# Patient Record
Sex: Female | Born: 1990 | Race: White | Hispanic: No | Marital: Single | Smoking: Former smoker
Health system: Southern US, Community
[De-identification: ages and names within clinical notes are randomized; demographics above are authoritative.]

## PROBLEM LIST (undated history)

## (undated) ENCOUNTER — Inpatient Hospital Stay (HOSPITAL_COMMUNITY): Payer: Self-pay

## (undated) DIAGNOSIS — R030 Elevated blood-pressure reading, without diagnosis of hypertension: Secondary | ICD-10-CM

## (undated) DIAGNOSIS — O24419 Gestational diabetes mellitus in pregnancy, unspecified control: Secondary | ICD-10-CM

## (undated) DIAGNOSIS — R519 Headache, unspecified: Secondary | ICD-10-CM

## (undated) DIAGNOSIS — O139 Gestational [pregnancy-induced] hypertension without significant proteinuria, unspecified trimester: Secondary | ICD-10-CM

## (undated) DIAGNOSIS — F32A Depression, unspecified: Secondary | ICD-10-CM

## (undated) DIAGNOSIS — E7800 Pure hypercholesterolemia, unspecified: Secondary | ICD-10-CM

## (undated) DIAGNOSIS — F329 Major depressive disorder, single episode, unspecified: Secondary | ICD-10-CM

## (undated) DIAGNOSIS — Z87442 Personal history of urinary calculi: Secondary | ICD-10-CM

## (undated) DIAGNOSIS — O009 Unspecified ectopic pregnancy without intrauterine pregnancy: Secondary | ICD-10-CM

## (undated) DIAGNOSIS — B019 Varicella without complication: Secondary | ICD-10-CM

## (undated) DIAGNOSIS — F988 Other specified behavioral and emotional disorders with onset usually occurring in childhood and adolescence: Secondary | ICD-10-CM

## (undated) DIAGNOSIS — F419 Anxiety disorder, unspecified: Secondary | ICD-10-CM

## (undated) DIAGNOSIS — IMO0001 Reserved for inherently not codable concepts without codable children: Secondary | ICD-10-CM

## (undated) DIAGNOSIS — R51 Headache: Secondary | ICD-10-CM

## (undated) HISTORY — DX: Headache, unspecified: R51.9

## (undated) HISTORY — DX: Varicella without complication: B01.9

## (undated) HISTORY — DX: Gestational diabetes mellitus in pregnancy, unspecified control: O24.419

## (undated) HISTORY — DX: Other specified behavioral and emotional disorders with onset usually occurring in childhood and adolescence: F98.8

## (undated) HISTORY — DX: Elevated blood-pressure reading, without diagnosis of hypertension: R03.0

## (undated) HISTORY — DX: Unspecified ectopic pregnancy without intrauterine pregnancy: O00.90

## (undated) HISTORY — DX: Pure hypercholesterolemia, unspecified: E78.00

## (undated) HISTORY — DX: Personal history of urinary calculi: Z87.442

## (undated) HISTORY — DX: Reserved for inherently not codable concepts without codable children: IMO0001

## (undated) HISTORY — DX: Anxiety disorder, unspecified: F41.9

## (undated) HISTORY — DX: Depression, unspecified: F32.A

## (undated) HISTORY — DX: Headache: R51

## (undated) HISTORY — DX: Major depressive disorder, single episode, unspecified: F32.9

## (undated) HISTORY — PX: WISDOM TOOTH EXTRACTION: SHX21

---
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
## 2010-05-02 ENCOUNTER — Emergency Department (HOSPITAL_BASED_OUTPATIENT_CLINIC_OR_DEPARTMENT_OTHER)
Admission: EM | Admit: 2010-05-02 | Discharge: 2010-05-02 | Payer: Self-pay | Source: Home / Self Care | Admitting: Emergency Medicine

## 2010-06-19 ENCOUNTER — Other Ambulatory Visit: Payer: Self-pay | Admitting: Obstetrics and Gynecology

## 2010-07-23 LAB — DIFFERENTIAL
Basophils Absolute: 0.1 10*3/uL (ref 0.0–0.1)
Basophils Relative: 1 % (ref 0–1)
Eosinophils Absolute: 0.1 10*3/uL (ref 0.0–0.7)
Eosinophils Relative: 1 % (ref 0–5)
Lymphocytes Relative: 34 % (ref 12–46)
Lymphs Abs: 3.1 10*3/uL (ref 0.7–4.0)
Monocytes Absolute: 0.6 10*3/uL (ref 0.1–1.0)
Monocytes Relative: 7 % (ref 3–12)
Neutro Abs: 5.2 10*3/uL (ref 1.7–7.7)
Neutrophils Relative %: 57 % (ref 43–77)

## 2010-07-23 LAB — URINALYSIS, ROUTINE W REFLEX MICROSCOPIC
Bilirubin Urine: NEGATIVE
Glucose, UA: NEGATIVE mg/dL
Hgb urine dipstick: NEGATIVE
Ketones, ur: NEGATIVE mg/dL
Nitrite: NEGATIVE
Protein, ur: NEGATIVE mg/dL
Specific Gravity, Urine: 1.023 (ref 1.005–1.030)
Urobilinogen, UA: 0.2 mg/dL (ref 0.0–1.0)
pH: 5.5 (ref 5.0–8.0)

## 2010-07-23 LAB — COMPREHENSIVE METABOLIC PANEL
ALT: 37 U/L — ABNORMAL HIGH (ref 0–35)
AST: 27 U/L (ref 0–37)
Albumin: 4.5 g/dL (ref 3.5–5.2)
Alkaline Phosphatase: 81 U/L (ref 39–117)
BUN: 9 mg/dL (ref 6–23)
CO2: 23 mEq/L (ref 19–32)
Calcium: 9.4 mg/dL (ref 8.4–10.5)
Chloride: 107 mEq/L (ref 96–112)
Creatinine, Ser: 0.8 mg/dL (ref 0.4–1.2)
GFR calc Af Amer: >60 mL/min (ref >60)
GFR calc non Af Amer: >60 mL/min (ref >60)
Glucose, Bld: 99 mg/dL (ref 70–99)
Potassium: 3.8 mEq/L (ref 3.5–5.1)
Sodium: 145 mEq/L (ref 135–145)
Total Bilirubin: 1 mg/dL (ref 0.3–1.2)
Total Protein: 7.6 g/dL (ref 6.0–8.3)

## 2010-07-23 LAB — CBC
HCT: 43.8 % (ref 36.0–46.0)
Hemoglobin: 15.8 g/dL — ABNORMAL HIGH (ref 12.0–15.0)
MCH: 29.6 pg (ref 26.0–34.0)
MCHC: 36.1 g/dL — ABNORMAL HIGH (ref 30.0–36.0)
MCV: 82 fL (ref 78.0–100.0)
Platelets: 265 10*3/uL (ref 150–400)
RBC: 5.34 MIL/uL — ABNORMAL HIGH (ref 3.87–5.11)
RDW: 12 % (ref 11.5–15.5)
WBC: 9.1 10*3/uL (ref 4.0–10.5)

## 2010-07-23 LAB — URINE MICROSCOPIC-ADD ON

## 2010-07-23 LAB — WET PREP, GENITAL
Trich, Wet Prep: NONE SEEN
Yeast Wet Prep HPF POC: NONE SEEN

## 2010-07-23 LAB — PREGNANCY, URINE: Preg Test, Ur: NEGATIVE

## 2010-07-23 LAB — GC/CHLAMYDIA PROBE AMP, GENITAL
Chlamydia, DNA Probe: NEGATIVE
GC Probe Amp, Genital: NEGATIVE

## 2010-09-28 NOTE — Op Note (Signed)
NAMEBERNITA, BECKSTROM NO.:  [id number]   MEDICAL RECORD NO.:  [id number]          PATIENT TYPE:  AMB   LOCATION:  SDS                          FACILITY:  [facility]   PHYSICIAN:  Jon Gills, M.D.  DATE OF BIRTH:  [birth year]   DATE OF PROCEDURE:  02/24/2006  DATE OF DISCHARGE:  02/24/2006                                 OPERATIVE REPORT   PREOPERATIVE DIAGNOSIS:  Hematemesis.   POSTOPERATIVE DIAGNOSIS:  Hematemesis.   PROCEDURE:  Upper GI endoscopy with biopsy.   SURGEON:  Jon Gills, MD   ASSISTANT:  None.   DESCRIPTION OF FINDINGS:  Following informed written consent, the patient  was taken to the operating room and placed under general anesthesia with  continuous cardiopulmonary monitoring.  She remained in the supine position  and the Olympus endoscope was passed by mouth without difficulty.  Visualization of the esophagus, stomach and duodenum were normal.  Specifically, there was no evidence for esophagitis, gastritis, duodenitis  or peptic ulcer disease.  There was no evidence for acute or old bleeding.  A solitary gastric biopsy was negative for Helicobacter.  Multiple biopsies  were obtained from the esophagus, stomach and duodenum which were  subsequently found to be histologically normal.  Endoscope was gradually  withdrawn.  The patient was awakened and taken recovery room in satisfactory  condition.  She will be released to the care of her parents later today.   DESCRIPTION OF TECHNICAL PROCEDURES USED:  Olympus GIF - S160 endoscope with  cold biopsy forceps.   SPECIMENS REMOVED:  Esophagus x3 in formalin, gastric x1 for CLO testing,  gastric x3 in formalin, duodenum x3 in formalin.           ______________________________  Jon Gills, M.D.     JHC/MEDQ  D:  03/20/2006  T:  03/21/2006  Job:  [id number]   cc:   Duard Brady, M.D.

## 2010-10-24 ENCOUNTER — Other Ambulatory Visit: Payer: Self-pay | Admitting: Dermatology

## 2010-10-27 ENCOUNTER — Emergency Department (HOSPITAL_BASED_OUTPATIENT_CLINIC_OR_DEPARTMENT_OTHER)
Admission: EM | Admit: 2010-10-27 | Discharge: 2010-10-27 | Disposition: A | Discharge status: Home / Self Care | Payer: BC Managed Care – PPO | Attending: Emergency Medicine | Admitting: Emergency Medicine

## 2010-10-27 DIAGNOSIS — F17200 Nicotine dependence, unspecified, uncomplicated: Secondary | ICD-10-CM | POA: Insufficient documentation

## 2010-10-27 DIAGNOSIS — M79609 Pain in unspecified limb: Secondary | ICD-10-CM | POA: Insufficient documentation

## 2011-02-03 ENCOUNTER — Encounter: Payer: Self-pay | Admitting: Emergency Medicine

## 2011-02-03 ENCOUNTER — Other Ambulatory Visit: Payer: Self-pay

## 2011-02-03 ENCOUNTER — Emergency Department (HOSPITAL_BASED_OUTPATIENT_CLINIC_OR_DEPARTMENT_OTHER)
Admission: EM | Admit: 2011-02-03 | Discharge: 2011-02-03 | Disposition: A | Discharge status: Home / Self Care | Payer: BC Managed Care – PPO | Attending: Emergency Medicine | Admitting: Emergency Medicine

## 2011-02-03 ENCOUNTER — Emergency Department (INDEPENDENT_AMBULATORY_CARE_PROVIDER_SITE_OTHER): Payer: BC Managed Care – PPO

## 2011-02-03 DIAGNOSIS — F17200 Nicotine dependence, unspecified, uncomplicated: Secondary | ICD-10-CM | POA: Insufficient documentation

## 2011-02-03 DIAGNOSIS — R079 Chest pain, unspecified: Secondary | ICD-10-CM

## 2011-02-03 DIAGNOSIS — R0789 Other chest pain: Secondary | ICD-10-CM

## 2011-02-03 DIAGNOSIS — F411 Generalized anxiety disorder: Secondary | ICD-10-CM | POA: Insufficient documentation

## 2011-02-03 DIAGNOSIS — F419 Anxiety disorder, unspecified: Secondary | ICD-10-CM

## 2011-02-03 MED ORDER — ALPRAZOLAM 0.5 MG PO TABS: 0.5000 mg | ORAL_TABLET | Freq: Every evening | ORAL | Status: AC | PRN

## 2011-02-03 NOTE — ED Notes (Signed)
Pt c/o LT side CP intermittently since yesterday; pt is tearful, sts she thinks it is r/t stress- her father died 2 wks ago from an MI.

## 2011-02-03 NOTE — ED Provider Notes (Signed)
History     CSN: [id number] Arrival date & time: 02/03/2011 11:12 AM  Chief Complaint  Patient presents with  . Chest Pain    HPI  (Consider location/radiation/quality/duration/timing/severity/associated sxs/prior treatment)  HPI Comments: Pt's dad died suddenly about 2 weeks ago, has been feeling a lot of stress/anxiety.  At night, feels like her head is racing, can't sleep.  No SI.  Has psychiatrist, but has not seen them about this  Patient is a 20 y.o. female presenting with chest pain. The history is provided by the patient.  Chest Pain The chest pain began yesterday. Chest pain occurs constantly. The chest pain is unchanged. Associated with: nothing. The severity of the pain is moderate. The quality of the pain is described as aching. The pain does not radiate. Exacerbated by: nothing. Pertinent negatives for primary symptoms include no fever, no fatigue, no shortness of breath, no cough, no abdominal pain, no nausea, no vomiting and no dizziness.  Pertinent negatives for associated symptoms include no diaphoresis, no lower extremity edema, no near-syncope, no numbness, no orthopnea and no weakness. She tried nothing for the symptoms. Risk factors: family hx.  Her past medical history is significant for anxiety/panic attacks.  Her family medical history is significant for heart disease in family.     History reviewed. No pertinent past medical history.  History reviewed. No pertinent past surgical history.  No family history on file.  History  Substance Use Topics  . Smoking status: Current Everyday Smoker  . Smokeless tobacco: Not on file  . Alcohol Use: Yes     occ    OB History    Grav Para Term Preterm Abortions TAB SAB Ect Mult Living                  Review of Systems  Review of Systems  Constitutional: Negative for fever, chills, diaphoresis and fatigue.  HENT: Negative for congestion, rhinorrhea and sneezing.   Eyes: Negative.   Respiratory: Negative for  cough, chest tightness and shortness of breath.   Cardiovascular: Positive for chest pain. Negative for orthopnea, leg swelling and near-syncope.  Gastrointestinal: Negative for nausea, vomiting, abdominal pain, diarrhea and blood in stool.  Genitourinary: Negative for frequency, hematuria, flank pain and difficulty urinating.  Musculoskeletal: Negative for back pain and arthralgias.  Skin: Negative for rash.  Neurological: Negative for dizziness, speech difficulty, weakness, numbness and headaches.  Psychiatric/Behavioral: The patient is nervous/anxious.     Allergies  Review of patient's allergies indicates no known allergies.  Home Medications   Current Outpatient Rx  Name Route Sig Dispense Refill  . AMPHETAMINE-DEXTROAMPHETAMINE 20 MG PO TABS Oral Take 20 mg by mouth 2 (two) times daily.      Marland Kitchen ALPRAZOLAM 0.5 MG PO TABS Oral Take 1 tablet (0.5 mg total) by mouth at bedtime as needed for sleep. 10 tablet 0    Physical Exam    BP 123/72  Pulse 58  Temp(Src) 97.7 F (36.5 C) (Oral)  Resp 16  SpO2 99%  LMP 01/25/2011  Physical Exam  Constitutional: She is oriented to person, place, and time. She appears well-developed and well-nourished.  HENT:  Head: Normocephalic and atraumatic.  Eyes: Pupils are equal, round, and reactive to light.  Neck: Normal range of motion. Neck supple.  Cardiovascular: Normal rate, regular rhythm and normal heart sounds.   Pulmonary/Chest: Effort normal and breath sounds normal. No respiratory distress. She has no wheezes. She has no rales. She exhibits no tenderness.  Abdominal: Soft.  Bowel sounds are normal. There is no tenderness. There is no rebound and no guarding.  Musculoskeletal: Normal range of motion. She exhibits no edema and no tenderness.  Lymphadenopathy:    She has no cervical adenopathy.  Neurological: She is alert and oriented to person, place, and time.  Skin: Skin is warm and dry. No rash noted.  Psychiatric: She has a normal  mood and affect.    ED Course  Procedures (including critical care time)  Labs Reviewed - No data to display Dg Chest 2 View  02/03/2011  *RADIOLOGY REPORT*  Clinical Data: Chest pain  CHEST - 2 VIEW  Comparison: 05/24/2007  Findings: Lungs clear.  Heart size and pulmonary vascularity normal.  No effusion.  Visualized bones unremarkable.  IMPRESSION: No acute disease  Original Report Authenticated By: Thora Lance III, M.D.     1. Atypical chest pain   2. Anxiety      MDM Pain seems consistent with anxiety reaction.  No evidence of ACS, pneumothorax, nothing to suggest PE.  Will give small amount of benzos, pt to f/u with her psychiatrist.  Advised her to return for any worsening symptoms       Date: 02/03/2011  Rate: 61  Rhythm: normal sinus rhythm  QRS Axis: normal  Intervals: normal  ST/T Wave abnormalities: normal  Conduction Disutrbances:none  Narrative Interpretation:   Old EKG Reviewed: none available    Rolan Bucco, MD 02/03/11 1238

## 2011-02-05 LAB — CBC
HCT: 43
Hemoglobin: 15.5 — ABNORMAL HIGH
MCHC: 36
MCV: 85.4
Platelets: 257
RBC: 5.03
RDW: 11.6
WBC: 12.5 — ABNORMAL HIGH

## 2011-02-05 LAB — URINALYSIS, ROUTINE W REFLEX MICROSCOPIC
Bilirubin Urine: NEGATIVE
Glucose, UA: NEGATIVE
Hgb urine dipstick: NEGATIVE
Ketones, ur: NEGATIVE
Nitrite: NEGATIVE
Protein, ur: NEGATIVE
Specific Gravity, Urine: 1.025
Urobilinogen, UA: 0.2
pH: 6

## 2011-02-05 LAB — LIPASE, BLOOD: Lipase: 30

## 2011-02-05 LAB — COMPREHENSIVE METABOLIC PANEL
ALT: 16
AST: 19
Albumin: 4.1
Alkaline Phosphatase: 61
BUN: 12
CO2: 26
Calcium: 9.6
Chloride: 104
Creatinine, Ser: 0.76
GFR calc Af Amer: >60
GFR calc non Af Amer: >60
Glucose, Bld: 95
Potassium: 3.7
Sodium: 138
Total Bilirubin: 0.5
Total Protein: 7.3

## 2011-02-05 LAB — DIFFERENTIAL
Basophils Absolute: 0.1
Basophils Relative: 1
Eosinophils Absolute: 0.1
Eosinophils Relative: 1
Lymphocytes Relative: 30
Lymphs Abs: 3.8
Monocytes Absolute: 0.8
Monocytes Relative: 6
Neutro Abs: 7.8 — ABNORMAL HIGH
Neutrophils Relative %: 62

## 2011-02-05 LAB — POCT PREGNANCY, URINE
Operator id: 24446
Preg Test, Ur: NEGATIVE

## 2011-06-05 ENCOUNTER — Ambulatory Visit (INDEPENDENT_AMBULATORY_CARE_PROVIDER_SITE_OTHER): Payer: BC Managed Care – PPO

## 2011-06-05 DIAGNOSIS — R059 Cough, unspecified: Secondary | ICD-10-CM

## 2011-06-05 DIAGNOSIS — R5381 Other malaise: Secondary | ICD-10-CM

## 2011-06-05 DIAGNOSIS — J40 Bronchitis, not specified as acute or chronic: Secondary | ICD-10-CM

## 2011-06-05 DIAGNOSIS — R05 Cough: Secondary | ICD-10-CM

## 2011-06-05 DIAGNOSIS — E669 Obesity, unspecified: Secondary | ICD-10-CM

## 2011-06-16 ENCOUNTER — Emergency Department (INDEPENDENT_AMBULATORY_CARE_PROVIDER_SITE_OTHER): Payer: BC Managed Care – PPO

## 2011-06-16 ENCOUNTER — Encounter (HOSPITAL_BASED_OUTPATIENT_CLINIC_OR_DEPARTMENT_OTHER): Payer: Self-pay | Admitting: Emergency Medicine

## 2011-06-16 ENCOUNTER — Emergency Department (HOSPITAL_BASED_OUTPATIENT_CLINIC_OR_DEPARTMENT_OTHER)
Admission: EM | Admit: 2011-06-16 | Discharge: 2011-06-16 | Disposition: A | Discharge status: Home / Self Care | Payer: BC Managed Care – PPO | Attending: Emergency Medicine | Admitting: Emergency Medicine

## 2011-06-16 DIAGNOSIS — M79609 Pain in unspecified limb: Secondary | ICD-10-CM

## 2011-06-16 DIAGNOSIS — M79646 Pain in unspecified finger(s): Secondary | ICD-10-CM

## 2011-06-16 DIAGNOSIS — F17200 Nicotine dependence, unspecified, uncomplicated: Secondary | ICD-10-CM | POA: Insufficient documentation

## 2011-06-16 MED ORDER — NAPROXEN 500 MG PO TABS: 500.0000 mg | ORAL_TABLET | Freq: Two times a day (BID) | ORAL | Status: DC

## 2011-06-16 MED ORDER — IBUPROFEN 800 MG PO TABS
800.0000 mg | ORAL_TABLET | Freq: Once | ORAL | Status: AC
  Administered 2011-06-16: 800 mg via ORAL
  Filled 2011-06-16: qty 1

## 2011-06-16 NOTE — ED Notes (Signed)
Pt c/o pain to LT index finger- no known injury- similar pain several weeks ago, but went away w/o intervention

## 2011-06-16 NOTE — ED Provider Notes (Signed)
History     CSN: [id number]  Arrival date & time 06/16/11  2841   First MD Initiated Contact with Patient 06/16/11 [phone]      Chief Complaint  Patient presents with  . Hand Pain    (Consider location/radiation/quality/duration/timing/severity/associated sxs/prior treatment) HPI Comments: The pain is located in the left index finger.  She has not noticed redness or swelling.  No recent injuries.    Patient is a 21 y.o. female presenting with hand pain. The history is provided by the patient.  Hand Pain This is a recurrent problem. Episode onset: Pt had an episode a couple of weeks ago that resolved but the pain returned today. The problem occurs constantly. The problem has not changed since onset.Associated symptoms comments: No fever, no recent injury. Exacerbated by: palpation and movement. The symptoms are relieved by nothing. She has tried nothing for the symptoms.    History reviewed. No pertinent past medical history.  History reviewed. No pertinent past surgical history.  No family history on file.  History  Substance Use Topics  . Smoking status: Current Everyday Smoker  . Smokeless tobacco: Not on file  . Alcohol Use: Yes     occ    OB History    Grav Para Term Preterm Abortions TAB SAB Ect Mult Living                  Review of Systems  Allergies  Review of patient's allergies indicates no known allergies.  Home Medications   Current Outpatient Rx  Name Route Sig Dispense Refill  . XANAX PO Oral Take by mouth as needed.    . TRAZODONE HCL PO Oral Take by mouth at bedtime as needed.    . AMPHETAMINE-DEXTROAMPHETAMINE 20 MG PO TABS Oral Take 20 mg by mouth 2 (two) times daily.        BP 137/84  Pulse 91  Temp(Src) 98.1 F (36.7 C) (Oral)  Resp 16  SpO2 99%  LMP 05/25/2011  Physical Exam  Nursing note and vitals reviewed. Constitutional: She appears well-developed and well-nourished. No distress.  HENT:  Head: Normocephalic and atraumatic.    Right Ear: External ear normal.  Left Ear: External ear normal.  Eyes: Conjunctivae are normal. Right eye exhibits no discharge. Left eye exhibits no discharge. No scleral icterus.  Neck: Neck supple. No tracheal deviation present.  Cardiovascular: Normal rate.   Pulmonary/Chest: Effort normal. No stridor. No respiratory distress.  Musculoskeletal: She exhibits no edema.       Left hand: She exhibits tenderness and bony tenderness. She exhibits normal two-point discrimination, no deformity, no laceration and no swelling. normal sensation noted. Normal strength noted.       Hands:      No erythema or edema, pain with range of motion  Pip, mcp and dip;   Neurological: She is alert. Cranial nerve deficit: no gross deficits.  Skin: Skin is warm and dry. No rash noted.  Psychiatric: She has a normal mood and affect.    ED Course  Procedures (including critical care time)  Labs Reviewed - No data to display Dg Finger Index Left  06/16/2011  *RADIOLOGY REPORT*  Clinical Data: Hand pain  LEFT INDEX FINGER 2+V  Comparison: None.  Findings: No fracture or dislocation of the index finger is identified.  No radiopaque foreign body.  No soft tissue abnormality.  IMPRESSION: Normal exam of the left index finger.  Original Report Authenticated By: Harrel Lemon, M.D.     1.  Finger pain       MDM  Patient's exam does not suggest an acute infection. There is no swelling or erythema. There also is no injury noted.  She is in normal cap refill and sensory exam. Her symptoms could be related to a tendinitis of are not certain why it would just in an index finger. As time there does not appear to be evidence of an acute emergency medical condition. I will discharge her home on a course of Naprosyn and splint her finger. I will refer her to a sports medicine physician.  She's been encourage to return for fever redness or swelling        Celene Kras, MD 06/16/11 [phone]

## 2011-06-28 ENCOUNTER — Encounter: Payer: Self-pay | Admitting: Family Medicine

## 2011-06-28 ENCOUNTER — Ambulatory Visit (INDEPENDENT_AMBULATORY_CARE_PROVIDER_SITE_OTHER): Payer: BC Managed Care – PPO | Admitting: Family Medicine

## 2011-06-28 VITALS — BP 131/85 | HR 111 | Temp 98.2°F | Ht 64.0 in | Wt 190.0 lb

## 2011-06-28 DIAGNOSIS — M659 Synovitis and tenosynovitis, unspecified: Secondary | ICD-10-CM

## 2011-06-28 DIAGNOSIS — M6530 Trigger finger, unspecified finger: Secondary | ICD-10-CM | POA: Insufficient documentation

## 2011-06-28 DIAGNOSIS — M65322 Trigger finger, left index finger: Secondary | ICD-10-CM | POA: Insufficient documentation

## 2011-06-28 DIAGNOSIS — M65839 Other synovitis and tenosynovitis, unspecified forearm: Secondary | ICD-10-CM

## 2011-06-28 MED ORDER — CIPROFLOXACIN HCL 500 MG PO TABS: 500.0000 mg | ORAL_TABLET | Freq: Two times a day (BID) | ORAL | Status: AC

## 2011-06-28 MED ORDER — CEPHALEXIN 500 MG PO CAPS: 500.0000 mg | ORAL_CAPSULE | Freq: Four times a day (QID) | ORAL | Status: AC

## 2011-06-28 NOTE — Patient Instructions (Signed)
I'm concerned you have flexor tenosynovitis of your left index finger. Take the antibiotics as directed until they are gone. I would recommend seeing a hand surgeon early next week even if you are improving - they may consider surgery if you do not improve from this condition. Ok to take aleve 1-2 tabs twice a day with food. Follow up with me as needed - call on Monday to let me know how your symptoms are doing.

## 2011-06-28 NOTE — Assessment & Plan Note (Signed)
consistent with flexor tenosynovitis but no obvious source of infection.  Not improved with nsaids provided from ED.  Fairly severe pain reproduced with passive extension.  Start cipro and keflex - patient referred to Hand surgeon to be seen Monday.  Stressed importance of appointment even if she feels that she is improving in case she needs to undergo surgical intervention.  Patient agreeable to treatment plan.  F/u with me prn.

## 2011-06-28 NOTE — Progress Notes (Signed)
  Subjective:    Patient ID: Alexis Ellis, female    DOB: 1990-06-28, 21 y.o.   MRN: [id number]  PCP: None  HPI 21 yo F here for left index finger pain.  Patient denies known injury. States approximately 2 weeks ago developed fairly quick onset of severe pain throughout palmar aspect of left index finger with associated swelling. Unable to bend finger due to severe pain. No bruising. Went to ED - had x-rays that were negative. Tried naproxen without help. At one point pain was somewhat better (a little over a week ago) and she could bend at PIP and DIP joints. No prior issues with left index finger. Is right handed.  History reviewed. No pertinent past medical history.  Current Outpatient Prescriptions on File Prior to Visit  Medication Sig Dispense Refill  . ALPRAZolam (XANAX PO) Take by mouth as needed.      Marland Kitchen amphetamine-dextroamphetamine (ADDERALL) 20 MG tablet Take 20 mg by mouth 2 (two) times daily.        . naproxen (NAPROSYN) 500 MG tablet Take 1 tablet (500 mg total) by mouth 2 (two) times daily.  30 tablet  0  . TRAZODONE HCL PO Take by mouth at bedtime as needed.        History reviewed. No pertinent past surgical history.  No Known Allergies  History   Social History  . Marital Status: Single    Spouse Name: N/A    Number of Children: N/A  . Years of Education: N/A   Occupational History  . Not on file.   Social History Main Topics  . Smoking status: Current Everyday Smoker -- 0.5 packs/day  . Smokeless tobacco: Not on file  . Alcohol Use: Yes     occ  . Drug Use: No  . Sexually Active: Yes    Birth Control/ Protection: None   Other Topics Concern  . Not on file   Social History Narrative  . No narrative on file    Family History  Problem Relation Age of Onset  . Hypertension Mother   . Diabetes Father   . Heart attack Father   . Sudden death Father   . Hyperlipidemia Neg Hx     BP 131/85  Pulse 111  Temp(Src) 98.2 F (36.8 C)  (Oral)  Ht 5\' 4"  (1.626 m)  Wt 190 lb (86.183 kg)  BMI 32.61 kg/m2  LMP 05/25/2011  Review of Systems See HPI above.    Objective:   Physical Exam Gen: NAD  L index finger: Mild swelling throughout but no bruising, erythema, warmth. Severe TTP throughout palmar aspect of digit though no TTP at A1 pulley.  No dorsal TTP. 5/5 strength with extension at DIP and PIP joints.  Can flex at DIP joint against resistance though very painful. Passive extension causes severe pain as well. Collateral ligaments intact of IP joints. NVI distally with < 2 sec cap refill.    Assessment & Plan:  1. Left 2nd digit pain - consistent with flexor tenosynovitis but no obvious source of infection.  Not improved with nsaids provided from ED.  Fairly severe pain reproduced with passive extension.  Start cipro and keflex - patient referred to Hand surgeon to be seen Monday.  Stressed importance of appointment even if she feels that she is improving in case she needs to undergo surgical intervention.  Patient agreeable to treatment plan.  F/u with me prn.

## 2011-07-21 ENCOUNTER — Emergency Department (HOSPITAL_BASED_OUTPATIENT_CLINIC_OR_DEPARTMENT_OTHER)
Admission: EM | Admit: 2011-07-21 | Discharge: 2011-07-21 | Disposition: A | Discharge status: Home / Self Care | Payer: BC Managed Care – PPO | Attending: Emergency Medicine | Admitting: Emergency Medicine

## 2011-07-21 ENCOUNTER — Encounter (HOSPITAL_BASED_OUTPATIENT_CLINIC_OR_DEPARTMENT_OTHER): Payer: Self-pay

## 2011-07-21 DIAGNOSIS — Y998 Other external cause status: Secondary | ICD-10-CM | POA: Insufficient documentation

## 2011-07-21 DIAGNOSIS — S61219A Laceration without foreign body of unspecified finger without damage to nail, initial encounter: Secondary | ICD-10-CM

## 2011-07-21 DIAGNOSIS — W260XXA Contact with knife, initial encounter: Secondary | ICD-10-CM | POA: Insufficient documentation

## 2011-07-21 DIAGNOSIS — F17200 Nicotine dependence, unspecified, uncomplicated: Secondary | ICD-10-CM | POA: Insufficient documentation

## 2011-07-21 DIAGNOSIS — Y93G1 Activity, food preparation and clean up: Secondary | ICD-10-CM | POA: Insufficient documentation

## 2011-07-21 DIAGNOSIS — S61209A Unspecified open wound of unspecified finger without damage to nail, initial encounter: Secondary | ICD-10-CM | POA: Insufficient documentation

## 2011-07-21 NOTE — ED Notes (Signed)
I had patient soak thumb in saline and iodine solution 50/50. I irrigated wound then set up cart in room. Wound did not seem deep, may need Dermabond?

## 2011-07-21 NOTE — ED Provider Notes (Signed)
History  Scribed for Gwyneth Sprout, MD, the patient was seen in room MH03/MH03. This chart was scribed by Candelaria Stagers. The patient's care started at 8:43 PM    CSN: [id number]  Arrival date & time 07/21/11  1839   First MD Initiated Contact with Patient 07/21/11 1945      Chief Complaint  Patient presents with  . Laceration     The history is provided by the patient.   Alexis Ellis is a 21 y.o. female who presents to the Emergency Department complaining of a laceration to the left thumb pad which she sliced on a can while cooking several hours ago.  She is experiencing no other sx.  Upon arrival the thumb was actively bleeding covered with gauze.  She has applied pressure to the thumb which helped with the bleeding.    History reviewed. No pertinent past medical history.  History reviewed. No pertinent past surgical history.  Family History  Problem Relation Age of Onset  . Hypertension Mother   . Diabetes Father   . Heart attack Father   . Sudden death Father   . Hyperlipidemia Neg Hx     History  Substance Use Topics  . Smoking status: Current Everyday Smoker -- 0.5 packs/day  . Smokeless tobacco: Not on file  . Alcohol Use: Yes     occ    OB History    Grav Para Term Preterm Abortions TAB SAB Ect Mult Living                  Review of Systems  Skin:       Laceration to the left thumb  All other systems reviewed and are negative.    Allergies  Review of patient's allergies indicates no known allergies.  Home Medications   Current Outpatient Rx  Name Route Sig Dispense Refill  . ALPRAZOLAM 0.25 MG PO TABS Oral Take 0.25 mg by mouth 2 (two) times daily as needed. For anxiety    . AMPHETAMINE-DEXTROAMPHETAMINE 20 MG PO TABS Oral Take 20 mg by mouth 2 (two) times daily.      Marland Kitchen PSEUDOEPHEDRINE HCL 30 MG PO TABS Oral Take 60 mg by mouth 2 (two) times daily as needed. For congestion      BP 129/96  Pulse 95  Resp 20  Ht 5\' 4"  (1.626 m)  Wt  184 lb (83.462 kg)  BMI 31.58 kg/m2  SpO2 98%  LMP 07/17/2011  Physical Exam  Nursing note and vitals reviewed. Constitutional: She is oriented to person, place, and time. She appears well-developed and well-nourished.  HENT:  Head: Normocephalic and atraumatic.  Eyes: EOM are normal. Right eye exhibits no discharge. Left eye exhibits no discharge.  Neck: Normal range of motion. Neck supple.  Cardiovascular: Normal rate.   Pulmonary/Chest: Effort normal. No respiratory distress.  Musculoskeletal: Normal range of motion.       3cm laceration of the left thumb pad.  Full ROM, no tendon injury.   Neurological: She is alert and oriented to person, place, and time.  Skin: Skin is warm and dry.  Psychiatric: She has a normal mood and affect. Her behavior is normal.    ED Course  Procedures   DIAGNOSTIC STUDIES: Oxygen Saturation is 98% on room air, normal by my interpretation.    COORDINATION OF CARE:  8:43 PM LACERATION REPAIR Performed by: Candelaria Stagers Consent: Verbal consent obtained. Risks and benefits: risks, benefits and alternatives were discussed Patient identity confirmed: provided demographic data  Time out performed prior to procedure Prepped and Draped in normal sterile fashion Wound explored  Laceration Location: left thumb pad  Laceration Length: 3 cm  No Foreign Bodies seen or palpated  Anesthesia: local infiltration  Local anesthetic: lidocaine 3%  Irrigation method: syringe Amount of cleaning: standard  Skin closure: 4.0 Prolene   Number of sutures or staples: 4 sutures,   Technique: Simple interrupted   Patient tolerance: Patient tolerated the procedure well with no immediate complications.      Labs Reviewed - No data to display No results found.   1. Finger laceration       MDM  Patient's tetanus shot is up-to-date. Wound repaired as above. I personally performed the services described in this documentation, which was scribed  in my presence.  The recorded information has been reviewed and considered.           Gwyneth Sprout, MD 07/22/11 [phone]

## 2011-07-21 NOTE — ED Notes (Signed)
Pt states that she sliced her finger open with a can while trying to cook dinner.  Actively bleeding at triage, gauze provided, pressure applied.

## 2011-10-21 ENCOUNTER — Ambulatory Visit (INDEPENDENT_AMBULATORY_CARE_PROVIDER_SITE_OTHER): Payer: BC Managed Care – PPO | Admitting: Family Medicine

## 2011-10-21 VITALS — BP 127/80 | HR 65 | Temp 98.1°F | Resp 16 | Ht 66.5 in | Wt 189.0 lb

## 2011-10-21 DIAGNOSIS — R112 Nausea with vomiting, unspecified: Secondary | ICD-10-CM

## 2011-10-21 DIAGNOSIS — J029 Acute pharyngitis, unspecified: Secondary | ICD-10-CM

## 2011-10-21 DIAGNOSIS — R197 Diarrhea, unspecified: Secondary | ICD-10-CM

## 2011-10-21 LAB — POCT RAPID STREP A (OFFICE): Rapid Strep A Screen: NEGATIVE

## 2011-10-21 MED ORDER — MAGIC MOUTHWASH W/LIDOCAINE: 5.0000 mL | Freq: Four times a day (QID) | ORAL | Status: DC | PRN

## 2011-10-21 NOTE — Patient Instructions (Addendum)
You can use ibuprofen over the counter, and the magic mouthwash for the sore throat.   Return to the clinic or go to the nearest emergency room if any of your symptoms worsen or new symptoms occur. Your should receive a call or letter about your lab results within the next week to 10 days.    Pharyngitis, Viral and Bacterial Pharyngitis is soreness (inflammation) or infection of the pharynx. It is also called a sore throat. CAUSES  Most sore throats are caused by viruses and are part of a cold. However, some sore throats are caused by strep and other bacteria. Sore throats can also be caused by post nasal drip from draining sinuses, allergies and sometimes from sleeping with an open mouth. Infectious sore throats can be spread from person to person by coughing, sneezing and sharing cups or eating utensils. TREATMENT  Sore throats that are viral usually last 3-4 days. Viral illness will get better without medications (antibiotics). Strep throat and other bacterial infections will usually begin to get better about 24-48 hours after you begin to take antibiotics. HOME CARE INSTRUCTIONS   If the caregiver feels there is a bacterial infection or if there is a positive strep test, they will prescribe an antibiotic. The full course of antibiotics must be taken. If the full course of antibiotic is not taken, you or your child may become ill again. If you or your child has strep throat and do not finish all of the medication, serious heart or kidney diseases may develop.   Drink enough water and fluids to keep your urine clear or pale yellow.   Only take over-the-counter or prescription medicines for pain, discomfort or fever as directed by your caregiver.   Get lots of rest.   Gargle with salt water ( tsp. of salt in a glass of water) as often as every 1-2 hours as you need for comfort.   Hard candies may soothe the throat if individual is not at risk for choking. Throat sprays or lozenges may also  be used.  SEEK MEDICAL CARE IF:   Large, tender lumps in the neck develop.   A rash develops.   Green, yellow-brown or bloody sputum is coughed up.   Your baby is older than 3 months with a rectal temperature of 100.5 F (38.1 C) or higher for more than 1 day.  SEEK IMMEDIATE MEDICAL CARE IF:   A stiff neck develops.   You or your child are drooling or unable to swallow liquids.   You or your child are vomiting, unable to keep medications or liquids down.   You or your child has severe pain, unrelieved with recommended medications.   You or your child are having difficulty breathing (not due to stuffy nose).   You or your child are unable to fully open your mouth.   You or your child develop redness, swelling, or severe pain anywhere on the neck.   You have a fever.   Your baby is older than 3 months with a rectal temperature of 102 F (38.9 C) or higher.   Your baby is 41 months old or younger with a rectal temperature of 100.4 F (38 C) or higher.  MAKE SURE YOU:   Understand these instructions.   Will watch your condition.   Will get help right away if you are not doing well or get worse.  Document Released: 04/29/2005 Document Revised: 04/18/2011 Document Reviewed: 07/27/2007 [facility] Patient Information 2012 [place], [place].  Gastroenteritis:  Diarrhea  Infections caused by germs (bacterial) or a virus commonly cause diarrhea. Your caregiver has determined that with time, rest and fluids, the diarrhea should improve. In general, eat normally while drinking more water than usual. Although water may prevent dehydration, it does not contain salt and minerals (electrolytes). Broths, weak tea without caffeine and oral rehydration solutions (ORS) replace fluids and electrolytes. Small amounts of fluids should be taken frequently. Large amounts at one time may not be tolerated. Plain water may be harmful in infants and the elderly. Oral rehydrating solutions (ORS) are  available at pharmacies and grocery stores. ORS replace water and important electrolytes in proper proportions. Sports drinks are not as effective as ORS and may be harmful due to sugars worsening diarrhea.  ORS is especially recommended for use in children with diarrhea. As a general guideline for children, replace any new fluid losses from diarrhea and/or vomiting with ORS as follows:   If your child weighs 22 pounds or under (10 kg or less), give 60-120 mL ( -  cup or 2 - 4 ounces) of ORS for each episode of diarrheal stool or vomiting episode.   If your child weighs more than 22 pounds (more than 10 kgs), give 120-240 mL ( - 1 cup or 4 - 8 ounces) of ORS for each diarrheal stool or episode of vomiting.   While correcting for dehydration, children should eat normally. However, foods high in sugar should be avoided because this may worsen diarrhea. Large amounts of carbonated soft drinks, juice, gelatin desserts and other highly sugared drinks should be avoided.   After correction of dehydration, other liquids that are appealing to the child may be added. Children should drink small amounts of fluids frequently and fluids should be increased as tolerated. Children should drink enough fluids to keep urine clear or pale yellow.   Adults should eat normally while drinking more fluids than usual. Drink small amounts of fluids frequently and increase as tolerated. Drink enough fluids to keep urine clear or pale yellow. Broths, weak decaffeinated tea, lemon lime soft drinks (allowed to go flat) and ORS replace fluids and electrolytes.   Avoid:   Carbonated drinks.   Juice.   Extremely hot or cold fluids.   Caffeine drinks.   Fatty, greasy foods.   Alcohol.   Tobacco.   Too much intake of anything at one time.   Gelatin desserts.   Probiotics are active cultures of beneficial bacteria. They may lessen the amount and number of diarrheal stools in adults. Probiotics can be found in  yogurt with active cultures and in supplements.   Wash hands well to avoid spreading bacteria and virus.   Anti-diarrheal medications are not recommended for infants and children.   Only take over-the-counter or prescription medicines for pain, discomfort or fever as directed by your caregiver. Do not give aspirin to children because it may cause Reye's Syndrome.   For adults, ask your caregiver if you should continue all prescribed and over-the-counter medicines.   If your caregiver has given you a follow-up appointment, it is very important to keep that appointment. Not keeping the appointment could result in a chronic or permanent injury, and disability. If there is any problem keeping the appointment, you must call back to this facility for assistance.  SEEK IMMEDIATE MEDICAL CARE IF:   You or your child is unable to keep fluids down or other symptoms or problems become worse in spite of treatment.   Vomiting or diarrhea develops and becomes persistent.  There is vomiting of blood or bile (green material).   There is blood in the stool or the stools are black and tarry.   There is no urine output in 6-8 hours or there is only a small amount of very dark urine.   Abdominal pain develops, increases or localizes.   You have a fever.   Your baby is older than 3 months with a rectal temperature of 102 F (38.9 C) or higher.   Your baby is 37 months old or younger with a rectal temperature of 100.4 F (38 C) or higher.   You or your child develops excessive weakness, dizziness, fainting or extreme thirst.   You or your child develops a rash, stiff neck, severe headache or become irritable or sleepy and difficult to awaken.  MAKE SURE YOU:   Understand these instructions.   Will watch your condition.   Will get help right away if you are not doing well or get worse.  Document Released: 04/19/2002 Document Revised: 04/18/2011 Document Reviewed: 03/06/2009 [facility] Patient  Information 2012 [place], [place].  Nausea and Vomiting Nausea is a sick feeling that often comes before throwing up (vomiting). Vomiting is a reflex where stomach contents come out of your mouth. Vomiting can cause severe loss of body fluids (dehydration). Children and elderly adults can become dehydrated quickly, especially if they also have diarrhea. Nausea and vomiting are symptoms of a condition or disease. It is important to find the cause of your symptoms. CAUSES   Direct irritation of the stomach lining. This irritation can result from increased acid production (gastroesophageal reflux disease), infection, food poisoning, taking certain medicines (such as nonsteroidal anti-inflammatory drugs), alcohol use, or tobacco use.   Signals from the brain.These signals could be caused by a headache, heat exposure, an inner ear disturbance, increased pressure in the brain from injury, infection, a tumor, or a concussion, pain, emotional stimulus, or metabolic problems.   An obstruction in the gastrointestinal tract (bowel obstruction).   Illnesses such as diabetes, hepatitis, gallbladder problems, appendicitis, kidney problems, cancer, sepsis, atypical symptoms of a heart attack, or eating disorders.   Medical treatments such as chemotherapy and radiation.   Receiving medicine that makes you sleep (general anesthetic) during surgery.  DIAGNOSIS Your caregiver may ask for tests to be done if the problems do not improve after a few days. Tests may also be done if symptoms are severe or if the reason for the nausea and vomiting is not clear. Tests may include:  Urine tests.   Blood tests.   Stool tests.   Cultures (to look for evidence of infection).   X-rays or other imaging studies.  Test results can help your caregiver make decisions about treatment or the need for additional tests. TREATMENT You need to stay well hydrated. Drink frequently but in small amounts.You may wish to drink  water, sports drinks, clear broth, or eat frozen ice pops or gelatin dessert to help stay hydrated.When you eat, eating slowly may help prevent nausea.There are also some antinausea medicines that may help prevent nausea. HOME CARE INSTRUCTIONS   Take all medicine as directed by your caregiver.   If you do not have an appetite, do not force yourself to eat. However, you must continue to drink fluids.   If you have an appetite, eat a normal diet unless your caregiver tells you differently.   Eat a variety of complex carbohydrates (rice, wheat, potatoes, bread), lean meats, yogurt, fruits, and vegetables.   Avoid high-fat foods  because they are more difficult to digest.   Drink enough water and fluids to keep your urine clear or pale yellow.   If you are dehydrated, ask your caregiver for specific rehydration instructions. Signs of dehydration may include:   Severe thirst.   Dry lips and mouth.   Dizziness.   Dark urine.   Decreasing urine frequency and amount.   Confusion.   Rapid breathing or pulse.  SEEK IMMEDIATE MEDICAL CARE IF:   You have blood or brown flecks (like coffee grounds) in your vomit.   You have black or bloody stools.   You have a severe headache or stiff neck.   You are confused.   You have severe abdominal pain.   You have chest pain or trouble breathing.   You do not urinate at least once every 8 hours.   You develop cold or clammy skin.   You continue to vomit for longer than 24 to 48 hours.   You have a fever.  MAKE SURE YOU:   Understand these instructions.   Will watch your condition.   Will get help right away if you are not doing well or get worse.  Document Released: 04/29/2005 Document Revised: 04/18/2011 Document Reviewed: 09/26/2010 [facility] Patient Information 2012 [place], [place].  Return to the clinic or go to the nearest emergency room if any of your symptoms worsen or new symptoms occur.

## 2011-10-21 NOTE — Progress Notes (Signed)
  Subjective:    Patient ID: Alexis Ellis, female    DOB: 1990/09/01, 21 y.o.   MRN: [id number]  HPI Alexis Ellis is a 21 y.o. female Woke up with vomiting, diarrhea yesterday am. Vomiting twice, diarrhea rest of the day.  Drinking fluids, urinating ok.  Then last night more sore throat, and worse sore throat today.  Subjective fever/chills last night.  Worst sx today - sore throat.  Tx: none.  No known sick contacts.  Investment banker, operational at [employer].  Quit smoking 2 months ago.   Review of Systems  Constitutional: Positive for fever and chills. Negative for diaphoresis.  HENT: Positive for congestion and sore throat.        Minimal congestion last night - none today.  Gastrointestinal: Positive for nausea, vomiting and diarrhea. Negative for abdominal pain and blood in stool.  Genitourinary: Negative for dysuria, urgency, frequency, hematuria, decreased urine volume and difficulty urinating.       Objective:   Physical Exam  Constitutional: She is oriented to person, place, and time. She appears well-developed and well-nourished. No distress.  HENT:  Head: Normocephalic and atraumatic.  Right Ear: Hearing, tympanic membrane, external ear and ear canal normal.  Left Ear: Hearing, tympanic membrane, external ear and ear canal normal.  Nose: Nose normal.  Mouth/Throat: Oropharynx is clear and moist. No oropharyngeal exudate.  Eyes: Conjunctivae and EOM are normal. Pupils are equal, round, and reactive to light.  Neck: Normal range of motion.  Cardiovascular: Normal rate, regular rhythm, normal heart sounds and intact distal pulses.   No murmur heard. Pulmonary/Chest: Effort normal and breath sounds normal. No respiratory distress. She has no wheezes. She has no rhonchi.  Abdominal: Soft. Normal appearance and bowel sounds are normal. She exhibits no distension. There is no hepatosplenomegaly. There is no tenderness. There is no rebound, no guarding and no CVA tenderness.    Lymphadenopathy:    She has no cervical adenopathy.  Neurological: She is alert and oriented to person, place, and time.  Skin: Skin is warm and dry. No rash noted.  Psychiatric: She has a normal mood and affect. Her behavior is normal.   Results for orders placed in visit on 10/21/11  POCT RAPID STREP A (OFFICE)      Component Value Range   Rapid Strep A Screen Negative  Negative        Assessment & Plan:  Alexis Ellis is a 21 y.o. female 1. Sore throat  POCT rapid strep A  2. Nausea & vomiting    3. Diarrhea    likley viral illness. Sx. Care - per handout Magic mw, lozenges prn.  oow today.  rtc precautions.

## 2011-10-24 LAB — CULTURE, GROUP A STREP: Organism ID, Bacteria: NORMAL

## 2011-12-01 ENCOUNTER — Emergency Department (HOSPITAL_COMMUNITY)
Admission: EM | Admit: 2011-12-01 | Discharge: 2011-12-04 | Disposition: A | Discharge status: Other Acute Inpatient Hospital | Payer: BC Managed Care – PPO

## 2011-12-01 NOTE — ED Notes (Signed)
Per EMS pt was hyerventilating, chest tightness, recent stress, no medications for anxiety, wants to be evaluated for anxiety, father passed away at early age for heart problems, she's worried she has heart problems, BP 158/100, RR 24, wants asthma ruled out.

## 2012-04-04 ENCOUNTER — Ambulatory Visit (INDEPENDENT_AMBULATORY_CARE_PROVIDER_SITE_OTHER): Payer: BC Managed Care – PPO | Admitting: Family Medicine

## 2012-04-04 ENCOUNTER — Ambulatory Visit: Payer: BC Managed Care – PPO

## 2012-04-04 VITALS — BP 115/79 | HR 79 | Temp 97.9°F | Resp 16 | Ht 66.5 in | Wt 195.0 lb

## 2012-04-04 DIAGNOSIS — M545 Low back pain, unspecified: Secondary | ICD-10-CM

## 2012-04-04 DIAGNOSIS — W57XXXA Bitten or stung by nonvenomous insect and other nonvenomous arthropods, initial encounter: Secondary | ICD-10-CM

## 2012-04-04 DIAGNOSIS — M542 Cervicalgia: Secondary | ICD-10-CM

## 2012-04-04 MED ORDER — MUPIROCIN CALCIUM 2 % EX CREA: TOPICAL_CREAM | Freq: Three times a day (TID) | CUTANEOUS | Status: DC

## 2012-04-04 MED ORDER — TRAMADOL HCL 50 MG PO TABS: 50.0000 mg | ORAL_TABLET | Freq: Three times a day (TID) | ORAL | Status: DC | PRN

## 2012-04-04 MED ORDER — CYCLOBENZAPRINE HCL 5 MG PO TABS: 5.0000 mg | ORAL_TABLET | Freq: Three times a day (TID) | ORAL | Status: DC | PRN

## 2012-04-04 MED ORDER — DICLOFENAC SODIUM 75 MG PO TBEC: 75.0000 mg | DELAYED_RELEASE_TABLET | Freq: Two times a day (BID) | ORAL | Status: DC

## 2012-04-04 NOTE — Patient Instructions (Addendum)

## 2012-04-04 NOTE — Progress Notes (Signed)
[facility]:  Office Visit  Chief Complaint:  Chief Complaint  Patient presents with  . Back Pain    long term patient has CD of xray  . insect bites    right cheek for 2 days    HPI: NYCHELLE ZABLOCKI is a 21 y.o. female who complains of  history of back pain, neck and lower back, x 1 month. Severe pain 8/10 . Non radiating. In neck and low back only. Has tried ibuprofen, chiropracter but no relief . 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 Denies numbness, tingling, incontinence.     Past Medical History  Diagnosis Date  . ADD (attention deficit disorder)    Past Surgical History  Procedure Date  . Wisedom teeth    History   Social History  . Marital Status: Single    Spouse Name: N/A    Number of Children: N/A  . Years of Education: N/A   Social History Main Topics  . Smoking status: Current Every Day Smoker -- 0.5 packs/day    Last Attempt to Quit: 08/21/2011  . Smokeless tobacco: None  . Alcohol Use: Yes     Comment: occ  . Drug Use: No  . Sexually Active: Yes    Birth Control/ Protection: None   Other Topics Concern  . None   Social History Narrative  . None   Family History  Problem Relation Age of Onset  . Hypertension Mother   . Diabetes Father   . Heart attack Father   . Sudden death Father   . Hyperlipidemia Neg Hx   . Bipolar disorder Maternal Grandmother   . Diabetes Maternal Grandfather   . Alcohol abuse Paternal Grandfather   . Liver disease Paternal Grandfather    No Known Allergies Prior to Admission medications   Medication Sig Start Date End Date Taking? Authorizing Provider  amphetamine-dextroamphetamine (ADDERALL) 20 MG tablet Take 20 mg by mouth 2 (two) times daily.     Yes Historical Provider, MD  norgestimate-ethinyl estradiol (ORTHO-CYCLEN,SPRINTEC,PREVIFEM) 0.25-35 MG-MCG tablet Take 1 tablet by mouth daily.   Yes Historical Provider, MD  traZODone (DESYREL) 50 MG tablet Take 50 mg by mouth at  bedtime.   Yes Historical Provider, MD     ROS: The patient denies fevers, chills, night sweats, unintentional weight loss, chest pain, palpitations, wheezing, dyspnea on exertion, nausea, vomiting, abdominal pain, dysuria, hematuria, melena, numbness, weakness, or tingling.  All other systems have been reviewed and were otherwise negative with the exception of those mentioned in the HPI and as above.    PHYSICAL EXAM: Filed Vitals:   04/04/12 1046  BP: 115/79  Pulse: 79  Temp: 97.9 F (36.6 C)  Resp: 16   Filed Vitals:   04/04/12 1046  Height: 5' 6.5" (1.689 m)  Weight: 195 lb (88.451 kg)   Body mass index is 31.00 kg/(m^2).  General: Alert, no acute distress HEENT:  Normocephalic, atraumatic, oropharynx patent.  Cardiovascular:  Regular rate and rhythm, no rubs murmurs or gallops.  No Carotid bruits, radial pulse intact. No pedal edema.  Respiratory: Clear to auscultation bilaterally.  No wheezes, rales, or rhonchi.  No cyanosis, no use of accessory musculature GI: No organomegaly, abdomen is soft and non-tender, positive bowel sounds.  No masses. Skin: No rashes. Neurologic: Facial musculature symmetric. Psychiatric: Patient is appropriate throughout our interaction. Lymphatic: No cervical lymphadenopathy Musculoskeletal: Gait intact. C-spine- decrease ROM in flexion and extension, Lateral ROM nl, 5/5 strength, neg  Spurling L-spine-tender paraspinal msk, decrease ROM in flexion due to pain, 5/5 strength, sensation intact, no saddle anesthesia, 2/2 ankle and knee jerks   LABS: Results for orders placed in visit on 10/21/11  POCT RAPID STREP A (OFFICE)      Component Value Range   Rapid Strep A Screen Negative  Negative  CULTURE, GROUP A STREP SCREEN      Component Value Range   Organism ID, Bacteria Normal Upper Respiratory Flora     Organism ID, Bacteria No Beta Hemolytic Streptococci Isolated       EKG/XRAY:   Primary read interpreted by Dr. Conley Rolls at [facility]. C-  spine-? c2-3 boney abnormality ? Fusion vs normal variant L-spine-no fractures or dislocations Both c and L spine have loss of normal curvature   ASSESSMENT/PLAN: Encounter Diagnoses  Name Primary?  . Cervicalgia Yes  . Lumbago    Rx Flexeril 5 mg TID prn, Diclofenac  100 mg BID with food, Tramadol #30 ROM exercises Take meds with food and water F/u in 2 weeks     Omega Slager PHUONG, DO 04/04/2012 12:29 PM

## 2012-05-13 HISTORY — PX: TUBAL LIGATION: SHX77

## 2012-07-31 ENCOUNTER — Encounter (HOSPITAL_BASED_OUTPATIENT_CLINIC_OR_DEPARTMENT_OTHER): Payer: Self-pay | Admitting: *Deleted

## 2012-07-31 ENCOUNTER — Emergency Department (HOSPITAL_BASED_OUTPATIENT_CLINIC_OR_DEPARTMENT_OTHER): Payer: BC Managed Care – PPO

## 2012-07-31 ENCOUNTER — Emergency Department (HOSPITAL_BASED_OUTPATIENT_CLINIC_OR_DEPARTMENT_OTHER)
Admission: EM | Admit: 2012-07-31 | Discharge: 2012-07-31 | Disposition: A | Discharge status: Home / Self Care | Payer: BC Managed Care – PPO | Attending: Emergency Medicine | Admitting: Emergency Medicine

## 2012-07-31 DIAGNOSIS — M79645 Pain in left finger(s): Secondary | ICD-10-CM

## 2012-07-31 DIAGNOSIS — F17200 Nicotine dependence, unspecified, uncomplicated: Secondary | ICD-10-CM | POA: Insufficient documentation

## 2012-07-31 DIAGNOSIS — Z79899 Other long term (current) drug therapy: Secondary | ICD-10-CM | POA: Insufficient documentation

## 2012-07-31 DIAGNOSIS — M79609 Pain in unspecified limb: Secondary | ICD-10-CM | POA: Insufficient documentation

## 2012-07-31 DIAGNOSIS — F988 Other specified behavioral and emotional disorders with onset usually occurring in childhood and adolescence: Secondary | ICD-10-CM | POA: Insufficient documentation

## 2012-07-31 LAB — GLUCOSE, CAPILLARY: Glucose-Capillary: 104 mg/dL — ABNORMAL HIGH (ref 70–99)

## 2012-07-31 NOTE — ED Notes (Signed)
C/o left middle finger pain and left hand swelling x 1 month- denies injury

## 2012-07-31 NOTE — ED Notes (Signed)
Patient transported to X-ray 

## 2012-07-31 NOTE — ED Provider Notes (Signed)
History     CSN: [id number]  Arrival date & time 07/31/12  2034   First MD Initiated Contact with Patient 07/31/12 2053      Chief Complaint  Patient presents with  . Hand Pain    (Consider location/radiation/quality/duration/timing/severity/associated sxs/prior treatment) HPI Comments: Patient presents with a two month history of pain in the left middle finger and hand.  No injury or trauma.  Denies repetitive movements.    Patient is a 22 y.o. female presenting with hand pain. The history is provided by the patient.  Hand Pain This is a new problem. Episode onset: 2 months ago. The problem occurs constantly. The problem has been gradually worsening. Exacerbated by: movement, palpation. Nothing relieves the symptoms. She has tried nothing for the symptoms.    Past Medical History  Diagnosis Date  . ADD (attention deficit disorder)     Past Surgical History  Procedure Laterality Date  . Wisedom teeth      Family History  Problem Relation Age of Onset  . Hypertension Mother   . Diabetes Father   . Heart attack Father   . Sudden death Father   . Hyperlipidemia Neg Hx   . Bipolar disorder Maternal Grandmother   . Diabetes Maternal Grandfather   . Alcohol abuse Paternal Grandfather   . Liver disease Paternal Grandfather     History  Substance Use Topics  . Smoking status: Current Every Day Smoker -- 0.50 packs/day    Last Attempt to Quit: 08/21/2011  . Smokeless tobacco: Never Used  . Alcohol Use: 1.2 oz/week    2 Glasses of wine per week    OB History   Grav Para Term Preterm Abortions TAB SAB Ect Mult Living                  Review of Systems  All other systems reviewed and are negative.    Allergies  Review of patient's allergies indicates no known allergies.  Home Medications   Current Outpatient Rx  Name  Route  Sig  Dispense  Refill  . amphetamine-dextroamphetamine (ADDERALL) 20 MG tablet   Oral   Take 20 mg by mouth 2 (two) times daily.           . traZODone (DESYREL) 50 MG tablet   Oral   Take 50 mg by mouth at bedtime.         . cyclobenzaprine (FLEXERIL) 5 MG tablet   Oral   Take 1 tablet (5 mg total) by mouth 3 (three) times daily as needed for muscle spasms.   30 tablet   0   . diclofenac (VOLTAREN) 75 MG EC tablet   Oral   Take 1 tablet (75 mg total) by mouth 2 (two) times daily. Take with food. Do not take with other NSAIDs   30 tablet   1   . mupirocin cream (BACTROBAN) 2 %   Topical   Apply topically 3 (three) times daily.   15 g   0   . norgestimate-ethinyl estradiol (ORTHO-CYCLEN,SPRINTEC,PREVIFEM) 0.25-35 MG-MCG tablet   Oral   Take 1 tablet by mouth daily.         . traMADol (ULTRAM) 50 MG tablet   Oral   Take 1 tablet (50 mg total) by mouth every 8 (eight) hours as needed for pain.   30 tablet   0     LMP 07/10/2012  Physical Exam  Nursing note and vitals reviewed. Constitutional: She is oriented to person, place, and time.  She appears well-developed and well-nourished. No distress.  HENT:  Head: Normocephalic and atraumatic.  Mouth/Throat: Oropharynx is clear and moist.  Neck: Normal range of motion. Neck supple.  Musculoskeletal:  The left hand appears grossly normal.  There is pain with range of motion of the finger from the pip joint to the tip of the finger.  Neurological: She is alert and oriented to person, place, and time.  Skin: Skin is warm and dry. She is not diaphoretic.    ED Course  Procedures (including critical care time)  Labs Reviewed - No data to display No results found.   No diagnosis found.    MDM  Will treat with buddy tape, nsaids.  Follow up prn.        Geoffery Lyons, MD 07/31/12 2112

## 2012-07-31 NOTE — ED Notes (Signed)
Left 3rd and 4th fingers cobaned together per voice order MD.

## 2012-09-16 ENCOUNTER — Emergency Department (HOSPITAL_COMMUNITY): Payer: BC Managed Care – PPO | Admitting: Anesthesiology

## 2012-09-16 ENCOUNTER — Emergency Department (HOSPITAL_COMMUNITY)
Admission: EM | Admit: 2012-09-16 | Discharge: 2012-09-17 | Disposition: A | Discharge status: Home / Self Care | Payer: BC Managed Care – PPO | Attending: Emergency Medicine | Admitting: Emergency Medicine

## 2012-09-16 ENCOUNTER — Ambulatory Visit: Admit: 2012-09-16 | Payer: Self-pay | Admitting: Obstetrics and Gynecology

## 2012-09-16 ENCOUNTER — Encounter (HOSPITAL_COMMUNITY): Payer: Self-pay | Admitting: Anesthesiology

## 2012-09-16 ENCOUNTER — Emergency Department (HOSPITAL_COMMUNITY): Payer: BC Managed Care – PPO

## 2012-09-16 ENCOUNTER — Encounter (HOSPITAL_COMMUNITY)
Admission: EM | Disposition: A | Discharge status: Home / Self Care | Payer: Self-pay | Source: Home / Self Care | Attending: Emergency Medicine

## 2012-09-16 ENCOUNTER — Encounter (HOSPITAL_COMMUNITY): Payer: Self-pay | Admitting: Emergency Medicine

## 2012-09-16 DIAGNOSIS — Z79899 Other long term (current) drug therapy: Secondary | ICD-10-CM | POA: Insufficient documentation

## 2012-09-16 DIAGNOSIS — F908 Attention-deficit hyperactivity disorder, other type: Secondary | ICD-10-CM | POA: Insufficient documentation

## 2012-09-16 DIAGNOSIS — O009 Unspecified ectopic pregnancy without intrauterine pregnancy: Secondary | ICD-10-CM

## 2012-09-16 DIAGNOSIS — O00109 Unspecified tubal pregnancy without intrauterine pregnancy: Secondary | ICD-10-CM | POA: Insufficient documentation

## 2012-09-16 DIAGNOSIS — Z6831 Body mass index (BMI) 31.0-31.9, adult: Secondary | ICD-10-CM | POA: Insufficient documentation

## 2012-09-16 HISTORY — PX: UNILATERAL SALPINGECTOMY: SHX6160

## 2012-09-16 HISTORY — PX: LAPAROSCOPY: SHX197

## 2012-09-16 LAB — POCT I-STAT, CHEM 8
BUN: 9 mg/dL (ref 6–23)
Calcium, Ion: 1.13 mmol/L (ref 1.12–1.23)
Chloride: 107 mEq/L (ref 96–112)
Creatinine, Ser: 0.6 mg/dL (ref 0.50–1.10)
Glucose, Bld: 124 mg/dL — ABNORMAL HIGH (ref 70–99)
HCT: 37 % (ref 36.0–46.0)
Hemoglobin: 12.6 g/dL (ref 12.0–15.0)
Potassium: 3.8 mEq/L (ref 3.5–5.1)
Sodium: 140 mEq/L (ref 135–145)
TCO2: 21 mmol/L (ref 0–100)

## 2012-09-16 LAB — CBC
HCT: 36 % (ref 36.0–46.0)
Hemoglobin: 12.8 g/dL (ref 12.0–15.0)
MCH: 31.1 pg (ref 26.0–34.0)
MCHC: 35.6 g/dL (ref 30.0–36.0)
MCV: 87.4 fL (ref 78.0–100.0)
Platelets: 281 10*3/uL (ref 150–400)
RBC: 4.12 MIL/uL (ref 3.87–5.11)
RDW: 12.5 % (ref 11.5–15.5)
WBC: 16.5 10*3/uL — ABNORMAL HIGH (ref 4.0–10.5)

## 2012-09-16 LAB — PREPARE RBC (CROSSMATCH)

## 2012-09-16 LAB — URINALYSIS, ROUTINE W REFLEX MICROSCOPIC
Bilirubin Urine: NEGATIVE
Glucose, UA: 250 mg/dL — AB
Hgb urine dipstick: NEGATIVE
Ketones, ur: NEGATIVE mg/dL
Leukocytes, UA: NEGATIVE
Nitrite: NEGATIVE
Protein, ur: NEGATIVE mg/dL
Specific Gravity, Urine: 1.031 — ABNORMAL HIGH (ref 1.005–1.030)
Urobilinogen, UA: 0.2 mg/dL (ref 0.0–1.0)
pH: 5 (ref 5.0–8.0)

## 2012-09-16 LAB — COMPREHENSIVE METABOLIC PANEL
ALT: 36 U/L — ABNORMAL HIGH (ref 0–35)
AST: 18 U/L (ref 0–37)
Albumin: 3.6 g/dL (ref 3.5–5.2)
Alkaline Phosphatase: 61 U/L (ref 39–117)
BUN: 10 mg/dL (ref 6–23)
CO2: 27 mEq/L (ref 19–32)
Calcium: 9 mg/dL (ref 8.4–10.5)
Chloride: 103 mEq/L (ref 96–112)
Creatinine, Ser: 0.83 mg/dL (ref 0.50–1.10)
GFR calc Af Amer: >90 mL/min (ref >90)
GFR calc non Af Amer: >90 mL/min (ref >90)
Glucose, Bld: 101 mg/dL — ABNORMAL HIGH (ref 70–99)
Potassium: 3.8 mEq/L (ref 3.5–5.1)
Sodium: 137 mEq/L (ref 135–145)
Total Bilirubin: 0.5 mg/dL (ref 0.3–1.2)
Total Protein: 6.3 g/dL (ref 6.0–8.3)

## 2012-09-16 LAB — CBC WITH DIFFERENTIAL/PLATELET
Basophils Absolute: 0 10*3/uL (ref 0.0–0.1)
Basophils Relative: 0 % (ref 0–1)
Eosinophils Absolute: 0.1 10*3/uL (ref 0.0–0.7)
Eosinophils Relative: 1 % (ref 0–5)
HCT: 38.6 % (ref 36.0–46.0)
Hemoglobin: 14 g/dL (ref 12.0–15.0)
Lymphocytes Relative: 17 % (ref 12–46)
Lymphs Abs: 2.5 10*3/uL (ref 0.7–4.0)
MCH: 30.6 pg (ref 26.0–34.0)
MCHC: 36.3 g/dL — ABNORMAL HIGH (ref 30.0–36.0)
MCV: 84.5 fL (ref 78.0–100.0)
Monocytes Absolute: 0.9 10*3/uL (ref 0.1–1.0)
Monocytes Relative: 6 % (ref 3–12)
Neutro Abs: 11 10*3/uL — ABNORMAL HIGH (ref 1.7–7.7)
Neutrophils Relative %: 76 % (ref 43–77)
Platelets: 274 10*3/uL (ref 150–400)
RBC: 4.57 MIL/uL (ref 3.87–5.11)
RDW: 12.4 % (ref 11.5–15.5)
WBC: 14.5 10*3/uL — ABNORMAL HIGH (ref 4.0–10.5)

## 2012-09-16 LAB — POCT PREGNANCY, URINE: Preg Test, Ur: POSITIVE — AB

## 2012-09-16 LAB — ABO/RH
ABO/RH(D): A POS
ABO/RH(D): A POS

## 2012-09-16 LAB — LIPASE, BLOOD: Lipase: 28 U/L (ref 11–59)

## 2012-09-16 LAB — HCG, QUANTITATIVE, PREGNANCY: hCG, Beta Chain, Quant, S: 828 m[IU]/mL — ABNORMAL HIGH (ref <5)

## 2012-09-16 SURGERY — LAPAROSCOPY OPERATIVE
Anesthesia: General | Site: Abdomen | Laterality: Right | Wound class: Clean Contaminated

## 2012-09-16 MED ORDER — SODIUM CHLORIDE 0.9 % IV BOLUS (SEPSIS)
1000.0000 mL | Freq: Once | INTRAVENOUS | Status: AC
  Administered 2012-09-16: 1000 mL via INTRAVENOUS

## 2012-09-16 MED ORDER — HYDROMORPHONE HCL PF 1 MG/ML IJ SOLN
1.0000 mg | Freq: Once | INTRAMUSCULAR | Status: AC
  Administered 2012-09-16: 1 mg via INTRAVENOUS
  Filled 2012-09-16: qty 1

## 2012-09-16 MED ORDER — NEOSTIGMINE METHYLSULFATE 1 MG/ML IJ SOLN
INTRAMUSCULAR | Status: DC | PRN
  Administered 2012-09-16: 2 mg via INTRAVENOUS

## 2012-09-16 MED ORDER — ONDANSETRON HCL 4 MG/2ML IJ SOLN
INTRAMUSCULAR | Status: DC | PRN
  Administered 2012-09-16: 4 mg via INTRAVENOUS

## 2012-09-16 MED ORDER — FENTANYL CITRATE 0.05 MG/ML IJ SOLN: 25.0000 ug | INTRAMUSCULAR | Status: DC | PRN

## 2012-09-16 MED ORDER — LACTATED RINGERS IV SOLN
INTRAVENOUS | Status: DC
  Administered 2012-09-16: 21:00:00 via INTRAVENOUS

## 2012-09-16 MED ORDER — DEXAMETHASONE SODIUM PHOSPHATE 4 MG/ML IJ SOLN
INTRAMUSCULAR | Status: DC | PRN
  Administered 2012-09-16: 10 mg via INTRAVENOUS

## 2012-09-16 MED ORDER — FAMOTIDINE IN NACL 20-0.9 MG/50ML-% IV SOLN
20.0000 mg | Freq: Once | INTRAVENOUS | Status: AC
  Administered 2012-09-16: 20 mg via INTRAVENOUS
  Filled 2012-09-16: qty 50

## 2012-09-16 MED ORDER — KETOROLAC TROMETHAMINE 30 MG/ML IJ SOLN
30.0000 mg | Freq: Once | INTRAMUSCULAR | Status: AC
  Administered 2012-09-16: 30 mg via INTRAVENOUS
  Filled 2012-09-16: qty 1

## 2012-09-16 MED ORDER — LIDOCAINE HCL (CARDIAC) 20 MG/ML IV SOLN
INTRAVENOUS | Status: DC | PRN
  Administered 2012-09-16: 80 mg via INTRAVENOUS

## 2012-09-16 MED ORDER — GLYCOPYRROLATE 0.2 MG/ML IJ SOLN
INTRAMUSCULAR | Status: DC | PRN
  Administered 2012-09-16: 0.4 mg via INTRAVENOUS

## 2012-09-16 MED ORDER — ROCURONIUM BROMIDE 100 MG/10ML IV SOLN
INTRAVENOUS | Status: DC | PRN
  Administered 2012-09-16: 45 mg via INTRAVENOUS
  Administered 2012-09-16: 5 mg via INTRAVENOUS

## 2012-09-16 MED ORDER — BUPIVACAINE HCL (PF) 0.5 % IJ SOLN
INTRAMUSCULAR | Status: DC | PRN
  Administered 2012-09-16: 6 mL
  Administered 2012-09-16: 24 mL

## 2012-09-16 MED ORDER — FENTANYL CITRATE 0.05 MG/ML IJ SOLN
INTRAMUSCULAR | Status: DC | PRN
  Administered 2012-09-16 (×3): 50 ug via INTRAVENOUS
  Administered 2012-09-16: 100 ug via INTRAVENOUS
  Administered 2012-09-16 (×5): 50 ug via INTRAVENOUS

## 2012-09-16 MED ORDER — LACTATED RINGERS IV SOLN
INTRAVENOUS | Status: DC | PRN
  Administered 2012-09-16 (×4): via INTRAVENOUS

## 2012-09-16 MED ORDER — OXYCODONE-ACETAMINOPHEN 5-325 MG PO TABS: 1.0000 | ORAL_TABLET | Freq: Four times a day (QID) | ORAL | Status: DC | PRN

## 2012-09-16 MED ORDER — MIDAZOLAM HCL 5 MG/5ML IJ SOLN
INTRAMUSCULAR | Status: DC | PRN
  Administered 2012-09-16: 2 mg via INTRAVENOUS

## 2012-09-16 MED ORDER — CEFAZOLIN SODIUM-DEXTROSE 2-3 GM-% IV SOLR
INTRAVENOUS | Status: DC | PRN
  Administered 2012-09-16: 2 g via INTRAVENOUS

## 2012-09-16 MED ORDER — PROPOFOL 10 MG/ML IV EMUL
INTRAVENOUS | Status: DC | PRN
  Administered 2012-09-16: 200 mg via INTRAVENOUS

## 2012-09-16 SURGICAL SUPPLY — 34 items
ADH SKN CLS APL DERMABOND .7 (GAUZE/BANDAGES/DRESSINGS) ×2
ADH SKN CLS LQ APL DERMABOND (GAUZE/BANDAGES/DRESSINGS) ×2
BAG SPEC RTRVL LRG 6X4 10 (ENDOMECHANICALS) ×4
BARRIER ADHS 3X4 INTERCEED (GAUZE/BANDAGES/DRESSINGS) ×1 IMPLANT
BRR ADH 4X3 ABS CNTRL BYND (GAUZE/BANDAGES/DRESSINGS) ×2
CABLE HIGH FREQUENCY MONO STRZ (ELECTRODE) IMPLANT
CATH ROBINSON RED A/P 16FR (CATHETERS) ×3 IMPLANT
CLOTH BEACON ORANGE TIMEOUT ST (SAFETY) ×3 IMPLANT
COVER TABLE BACK 60X90 (DRAPES) ×1 IMPLANT
DERMABOND ADHESIVE PROPEN (GAUZE/BANDAGES/DRESSINGS) ×1
DERMABOND ADVANCED (GAUZE/BANDAGES/DRESSINGS) ×1
DERMABOND ADVANCED .7 DNX12 (GAUZE/BANDAGES/DRESSINGS) ×2 IMPLANT
DERMABOND ADVANCED .7 DNX6 (GAUZE/BANDAGES/DRESSINGS) IMPLANT
GLOVE BIO SURGEON STRL SZ8 (GLOVE) ×6 IMPLANT
GOWN PREVENTION PLUS LG XLONG (DISPOSABLE) ×3 IMPLANT
NEEDLE INSUFFLATION 120MM (ENDOMECHANICALS) ×3 IMPLANT
NS IRRIG 1000ML POUR BTL (IV SOLUTION) ×3 IMPLANT
PACK LAPAROSCOPY BASIN (CUSTOM PROCEDURE TRAY) ×3 IMPLANT
POUCH SPECIMEN RETRIEVAL 10MM (ENDOMECHANICALS) ×2 IMPLANT
PROTECTOR NERVE ULNAR (MISCELLANEOUS) ×3 IMPLANT
SEALER TISSUE G2 CVD JAW 35 (ENDOMECHANICALS) IMPLANT
SEALER TISSUE G2 CVD JAW 45CM (ENDOMECHANICALS) IMPLANT
SEALER TISSUE RND STRG TIP 45C (ENDOMECHANICALS) ×1 IMPLANT
SET IRRIG TUBING LAPAROSCOPIC (IRRIGATION / IRRIGATOR) IMPLANT
STRIP CLOSURE SKIN 1/4X3 (GAUZE/BANDAGES/DRESSINGS) IMPLANT
SUT VIC AB 3-0 PS2 18 (SUTURE)
SUT VIC AB 3-0 PS2 18XBRD (SUTURE) IMPLANT
SUT VICRYL 0 UR6 27IN ABS (SUTURE) ×3 IMPLANT
SYR 30ML LL (SYRINGE) IMPLANT
TOWEL OR 17X24 6PK STRL BLUE (TOWEL DISPOSABLE) ×6 IMPLANT
TROCAR XCEL NON-BLD 11X100MML (ENDOMECHANICALS) ×2 IMPLANT
TROCAR XCEL NON-BLD 5MMX100MML (ENDOMECHANICALS) ×1 IMPLANT
WARMER LAPAROSCOPE (MISCELLANEOUS) ×3 IMPLANT
WATER STERILE IRR 1000ML POUR (IV SOLUTION) ×2 IMPLANT

## 2012-09-16 NOTE — Transfer of Care (Addendum)
Immediate Anesthesia Transfer of Care Note  Patient: Alexis Ellis  Procedure(s) Performed: Procedure(s) with comments: LAPAROSCOPY OPERATIVE (N/A) - Evacuation Hemoperitoneum UNILATERAL SALPINGECTOMY (Right)  Patient Location: PACU  Anesthesia Type:General  Level of Consciousness: awake, alert , oriented and patient cooperative  Airway & Oxygen Therapy: Patient Spontanous Breathing and Patient connected to nasal cannula oxygen  Post-op Assessment: Report given to PACU RN and Post -op Vital signs reviewed and stable  Post vital signs: Reviewed and stable  Complications: No apparent anesthesia complications

## 2012-09-16 NOTE — Anesthesia Preprocedure Evaluation (Addendum)
Anesthesia Evaluation  Patient identified by MRN, date of birth, ID band Patient awake    Reviewed: Allergy & Precautions, H&P , Patient's Chart, lab work & pertinent test results, reviewed documented beta blocker date and time   Airway Mallampati: II TM Distance: >3 FB Neck ROM: full    Dental no notable dental hx.    Pulmonary  breath sounds clear to auscultation  Pulmonary exam normal       Cardiovascular Rhythm:regular Rate:Normal     Neuro/Psych    GI/Hepatic   Endo/Other  Morbid obesity  Renal/GU      Musculoskeletal   Abdominal   Peds  Hematology   Anesthesia Other Findings   Reproductive/Obstetrics                          Anesthesia Physical Anesthesia Plan  ASA: III and emergent  Anesthesia Plan: General   Post-op Pain Management:    Induction: Intravenous  Airway Management Planned: Oral ETT  Additional Equipment:   Intra-op Plan:   Post-operative Plan:   Informed Consent: I have reviewed the patients History and Physical, chart, labs and discussed the procedure including the risks, benefits and alternatives for the proposed anesthesia with the patient or authorized representative who has indicated his/her understanding and acceptance.   Dental Advisory Given and Dental advisory given  Plan Discussed with: CRNA and Surgeon  Anesthesia Plan Comments: (  Discussed  general anesthesia, including possible nausea, instrumentation of airway, sore throat,pulmonary aspiration, etc. I asked if the were any outstanding questions, or  concerns before we proceeded. )       Anesthesia Quick Evaluation

## 2012-09-16 NOTE — MAU Note (Signed)
Sudden onset of pain at 0630 this AM.

## 2012-09-16 NOTE — Preoperative (Signed)
Beta Blockers   Reason not to administer Beta Blockers:Not Applicable 

## 2012-09-16 NOTE — Brief Op Note (Signed)
09/16/2012  11:18 PM  PATIENT:  Alexis Ellis  22 y.o. female  PRE-OPERATIVE DIAGNOSIS:  Ectopic Pregnancy  POST-OPERATIVE DIAGNOSIS:  Ruptured right ectopic pregnancy  PROCEDURE:  Procedure(s) with comments: LAPAROSCOPY OPERATIVE (N/A) - Evacuation Hemoperitoneum UNILATERAL SALPINGECTOMY (Right)  SURGEON:  Surgeon(s) and Role:    * Leslie Andrea, MD - Primary  PHYSICIAN ASSISTANT:   ASSISTANTS: none   ANESTHESIA:   general  EBL:  Total I/O In: 400 [I.V.:400] Out: 350 [Urine:100; Blood:250]  BLOOD ADMINISTERED:none  DRAINS: none   LOCAL MEDICATIONS USED:  MARCAINE     SPECIMEN:  Source of Specimen:  right fallopian tube and clot  DISPOSITION OF SPECIMEN:  PATHOLOGY  COUNTS:  YES  TOURNIQUET:  * No tourniquets in log *  DICTATION: .Other Dictation: Dictation Number [id number]  PLAN OF CARE: Discharge to home after PACU  PATIENT DISPOSITION:  PACU - hemodynamically stable.   Delay start of Pharmacological VTE agent (>24hrs) due to surgical blood loss or risk of bleeding: not applicable

## 2012-09-16 NOTE — ED Notes (Signed)
Per EMS pt came from home c/o abdominal pain around pelvis that moved to right side and radiates to neck and shoulder. LMP was first week in April and spotting in last week of April but not a normal menstrual cycle. Pt is sexually active. Pain started 6am this morning. Denies urinary symptoms but c/o pain with BM. Pain 10/10 sharp and stabbing.

## 2012-09-16 NOTE — Progress Notes (Signed)
Belonging given to pt's mother by pt

## 2012-09-16 NOTE — ED Notes (Signed)
MD at bedside for US

## 2012-09-16 NOTE — H&P (Signed)
Alexis Ellis is an 22 y.o. female G1P0 off OCPs for several months. Last normal menstrual cycle 08/11/12. Had some brown spotting end of April, now gone.  Sudden onset of severe central pelvic pain this am about 6:30. Continues, now with right shoulder and neck pain. Denies history of abdominal surgery, infection, STD. No syncope, no N/V. Last food at 2:30 pm. Bedside U/S at [facility] ED C/W fluid in pelvis. Transferred to [facility] for treatment.  Pertinent Gynecological History: Menses: none now Bleeding: N/A Contraception: none DES exposure: denies Blood transfusions: none Sexually transmitted diseases: no past history Previous GYN Procedures: none  Last mammogram: N/A Date: N/A Last pap: unknown Date: unknown OB History: G1, P0   Menstrual History: Menarche age: unknown  Patient's last menstrual period was 08/11/2012.    Past Medical History  Diagnosis Date  . ADD (attention deficit disorder)     Past Surgical History  Procedure Laterality Date  . Wisedom teeth      Family History  Problem Relation Age of Onset  . Hypertension Mother   . Diabetes Father   . Heart attack Father   . Sudden death Father   . Hyperlipidemia Neg Hx   . Bipolar disorder Maternal Grandmother   . Diabetes Maternal Grandfather   . Alcohol abuse Paternal Grandfather   . Liver disease Paternal Grandfather     Social History:  reports that she has been smoking.  She has never used smokeless tobacco. She reports that she drinks about 1.2 ounces of alcohol per week. She reports that she does not use illicit drugs.  Allergies: No Known Allergies  Prescriptions prior to admission  Medication Sig Dispense Refill  . amphetamine-dextroamphetamine (ADDERALL) 20 MG tablet Take 20 mg by mouth 4 (four) times daily.       . diazepam (VALIUM) 2 MG tablet Take 2 mg by mouth every 6 (six) hours as needed for anxiety.      . traZODone (DESYREL) 50 MG tablet Take 50 mg by mouth at bedtime as needed for sleep.          Review of Systems  Constitutional: Negative for fever and chills.  HENT: Positive for neck pain.   Gastrointestinal: Positive for abdominal pain.  Musculoskeletal: Positive for joint pain.    Blood pressure 120/77, pulse 90, temperature 98.7 F (37.1 C), temperature source Oral, resp. rate 18, last menstrual period 08/11/2012, SpO2 96.00%. Physical Exam  Constitutional: She is oriented to person, place, and time. She appears well-developed and well-nourished.  Neck: Normal range of motion. Neck supple.  Cardiovascular: Normal rate and regular rhythm.   Respiratory: Effort normal and breath sounds normal.  GI: Soft. There is tenderness. There is no rebound.  RLQ > LLQ tenderness No rebound  Neurological: She is alert and oriented to person, place, and time.  Skin: She is not diaphoretic.    Results for orders placed during the hospital encounter of 09/16/12 (from the past 24 hour(s))  POCT PREGNANCY, URINE     Status: Abnormal   Collection Time    09/16/12  6:01 PM      Result Value Range   Preg Test, Ur POSITIVE (*) NEGATIVE  CBC WITH DIFFERENTIAL     Status: Abnormal   Collection Time    09/16/12  6:07 PM      Result Value Range   WBC 14.5 (*) 4.0 - 10.5 K/uL   RBC 4.57  3.87 - 5.11 MIL/uL   Hemoglobin 14.0  12.0 - 15.0 g/dL  HCT 38.6  36.0 - 46.0 %   MCV 84.5  78.0 - 100.0 fL   MCH 30.6  26.0 - 34.0 pg   MCHC 36.3 (*) 30.0 - 36.0 g/dL   RDW 16.1  09.6 - 04.5 %   Platelets 274  150 - 400 K/uL   Neutrophils Relative 76  43 - 77 %   Neutro Abs 11.0 (*) 1.7 - 7.7 K/uL   Lymphocytes Relative 17  12 - 46 %   Lymphs Abs 2.5  0.7 - 4.0 K/uL   Monocytes Relative 6  3 - 12 %   Monocytes Absolute 0.9  0.1 - 1.0 K/uL   Eosinophils Relative 1  0 - 5 %   Eosinophils Absolute 0.1  0.0 - 0.7 K/uL   Basophils Relative 0  0 - 1 %   Basophils Absolute 0.0  0.0 - 0.1 K/uL  COMPREHENSIVE METABOLIC PANEL     Status: Abnormal   Collection Time    09/16/12  6:07 PM      Result  Value Range   Sodium 137  135 - 145 mEq/L   Potassium 3.8  3.5 - 5.1 mEq/L   Chloride 103  96 - 112 mEq/L   CO2 27  19 - 32 mEq/L   Glucose, Bld 101 (*) 70 - 99 mg/dL   BUN 10  6 - 23 mg/dL   Creatinine, Ser 4.09  0.50 - 1.10 mg/dL   Calcium 9.0  8.4 - 81.1 mg/dL   Total Protein 6.3  6.0 - 8.3 g/dL   Albumin 3.6  3.5 - 5.2 g/dL   AST 18  0 - 37 U/L   ALT 36 (*) 0 - 35 U/L   Alkaline Phosphatase 61  39 - 117 U/L   Total Bilirubin 0.5  0.3 - 1.2 mg/dL   GFR calc non Af Amer >90  >90 mL/min   GFR calc Af Amer >90  >90 mL/min  LIPASE, BLOOD     Status: None   Collection Time    09/16/12  6:07 PM      Result Value Range   Lipase 28  11 - 59 U/L  ABO/RH     Status: None   Collection Time    09/16/12  6:07 PM      Result Value Range   ABO/RH(D) A POS     No rh immune globuloin NOT A RH IMMUNE GLOBULIN CANDIDATE, PT RH POSITIVE    URINALYSIS, ROUTINE W REFLEX MICROSCOPIC     Status: Abnormal   Collection Time    09/16/12  6:11 PM      Result Value Range   Color, Urine YELLOW  YELLOW   APPearance CLEAR  CLEAR   Specific Gravity, Urine 1.031 (*) 1.005 - 1.030   pH 5.0  5.0 - 8.0   Glucose, UA 250 (*) NEGATIVE mg/dL   Hgb urine dipstick NEGATIVE  NEGATIVE   Bilirubin Urine NEGATIVE  NEGATIVE   Ketones, ur NEGATIVE  NEGATIVE mg/dL   Protein, ur NEGATIVE  NEGATIVE mg/dL   Urobilinogen, UA 0.2  0.0 - 1.0 mg/dL   Nitrite NEGATIVE  NEGATIVE   Leukocytes, UA NEGATIVE  NEGATIVE  HCG, QUANTITATIVE, PREGNANCY     Status: Abnormal   Collection Time    09/16/12  6:11 PM      Result Value Range   hCG, Beta Chain, Quant, S 828 (*) <5 mIU/mL  POCT I-STAT, CHEM 8     Status: Abnormal   Collection  Time    09/16/12  7:54 PM      Result Value Range   Sodium 140  135 - 145 mEq/L   Potassium 3.8  3.5 - 5.1 mEq/L   Chloride 107  96 - 112 mEq/L   BUN 9  6 - 23 mg/dL   Creatinine, Ser 2.13  0.50 - 1.10 mg/dL   Glucose, Bld 086 (*) 70 - 99 mg/dL   Calcium, Ion 5.78  4.69 - 1.23 mmol/L   TCO2  21  0 - 100 mmol/L   Hemoglobin 12.6  12.0 - 15.0 g/dL   HCT 62.9  52.8 - 41.3 %    No results found.  Assessment/Plan: 22 yo G1P0 with probable ectopic pregnancy. Patient stable. Will get bedside U/S with radiology, repeat CBC and Hold Clot. D/W patient and mother above. D/W probable surgery L/S, possible laparotomy, possible salpingectomy. D/W risks including infection, organ damage, DVT/PE, pneumonia, bleeding/transfusion-HIV/Hep. D/W recovery, possible recurrent ectopic pregnancy, PO pain-short term or long term.  All questions answered.  Kendall Arnell II,Khiya Friese E 09/16/2012, 8:41 PM

## 2012-09-16 NOTE — ED Provider Notes (Signed)
History     CSN: [id number]  Arrival date & time 09/16/12  1737   First MD Initiated Contact with Patient 09/16/12 1738      Chief Complaint  Patient presents with  . Abdominal Pain    (Consider location/radiation/quality/duration/timing/severity/associated sxs/prior treatment) HPI Comments: 22 year old female who is sexually active who presents with a complaint of right-sided abdominal pain. She states this started acute in onset at 6:30 AM this morning. The pain has been persistent, worse with palpation of the abdomen and now feels like the pain is radiating to the right upper quadrant into the right shoulder. The most significant amount of pain that she has is in the right lower quadrant. She had a normal menstrual cycle at the beginning of April, a small amount of bleeding at the end of April but has not yet had her may menstrual cycle. She has associated nausea, no changes in her bowel habits, no dysuria. She has not taken a home pregnancy test. EMS reports no hypotension or tachycardia  Patient is a 22 y.o. female presenting with abdominal pain. The history is provided by the patient and the EMS personnel.  Abdominal Pain Associated symptoms: nausea   Associated symptoms: no chest pain, no chills, no cough, no diarrhea, no dysuria, no fever, no shortness of breath, no sore throat and no vomiting     Past Medical History  Diagnosis Date  . ADD (attention deficit disorder)     Past Surgical History  Procedure Laterality Date  . Wisedom teeth      Family History  Problem Relation Age of Onset  . Hypertension Mother   . Diabetes Father   . Heart attack Father   . Sudden death Father   . Hyperlipidemia Neg Hx   . Bipolar disorder Maternal Grandmother   . Diabetes Maternal Grandfather   . Alcohol abuse Paternal Grandfather   . Liver disease Paternal Grandfather     History  Substance Use Topics  . Smoking status: Current Every Day Smoker -- 0.50 packs/day    Last  Attempt to Quit: 08/21/2011  . Smokeless tobacco: Never Used  . Alcohol Use: 1.2 oz/week    2 Glasses of wine per week    OB History   Grav Para Term Preterm Abortions TAB SAB Ect Mult Living                  Review of Systems  Constitutional: Negative for fever and chills.  HENT: Negative for sore throat and neck pain.   Eyes: Negative for visual disturbance.  Respiratory: Negative for cough and shortness of breath.   Cardiovascular: Negative for chest pain.  Gastrointestinal: Positive for nausea and abdominal pain. Negative for vomiting and diarrhea.  Genitourinary: Negative for dysuria and frequency.  Musculoskeletal: Negative for back pain.  Skin: Negative for rash.  Neurological: Negative for weakness, numbness and headaches.  Hematological: Negative for adenopathy.  Psychiatric/Behavioral: Negative for behavioral problems.    Allergies  Review of patient's allergies indicates no known allergies.  Home Medications   Current Outpatient Rx  Name  Route  Sig  Dispense  Refill  . amphetamine-dextroamphetamine (ADDERALL) 20 MG tablet   Oral   Take 20 mg by mouth 4 (four) times daily.          . diazepam (VALIUM) 2 MG tablet   Oral   Take 2 mg by mouth every 6 (six) hours as needed for anxiety.         . traZODone (DESYREL)  50 MG tablet   Oral   Take 50 mg by mouth at bedtime as needed for sleep.            BP 139/91  Temp(Src) 98.7 F (37.1 C) (Oral)  Resp 18  SpO2 98%  LMP 08/11/2012  Physical Exam  Nursing note and vitals reviewed. Constitutional: She appears well-developed and well-nourished.  Uncomfortable appearing  HENT:  Head: Normocephalic and atraumatic.  Mouth/Throat: Oropharynx is clear and moist. No oropharyngeal exudate.  Eyes: Conjunctivae and EOM are normal. Pupils are equal, round, and reactive to light. Right eye exhibits no discharge. Left eye exhibits no discharge. No scleral icterus.  Neck: Normal range of motion. Neck supple.  No JVD present. No thyromegaly present.  Cardiovascular: Normal rate, regular rhythm, normal heart sounds and intact distal pulses.  Exam reveals no gallop and no friction rub.   No murmur heard. Pulmonary/Chest: Effort normal and breath sounds normal. No respiratory distress. She has no wheezes. She has no rales.  Abdominal: Soft. Bowel sounds are normal. She exhibits no distension and no mass. There is tenderness ( Right greater than left-sided tenderness, right lower quadrant most tender, mild tenderness to the right upper quadrant, no peritoneal signs).  Guarding to the right lower quadrant, no surgical abdomen at this time  Musculoskeletal: Normal range of motion. She exhibits no edema and no tenderness.  Lymphadenopathy:    She has no cervical adenopathy.  Neurological: She is alert. Coordination normal.  Skin: Skin is warm and dry. No rash noted. No erythema.  Psychiatric: She has a normal mood and affect. Her behavior is normal.    ED Course  Procedures (including critical care time)  Labs Reviewed  URINALYSIS, ROUTINE W REFLEX MICROSCOPIC - Abnormal; Notable for the following:    Specific Gravity, Urine 1.031 (*)    Glucose, UA 250 (*)    All other components within normal limits  CBC WITH DIFFERENTIAL - Abnormal; Notable for the following:    WBC 14.5 (*)    MCHC 36.3 (*)    Neutro Abs 11.0 (*)    All other components within normal limits  COMPREHENSIVE METABOLIC PANEL - Abnormal; Notable for the following:    Glucose, Bld 101 (*)    ALT 36 (*)    All other components within normal limits  POCT PREGNANCY, URINE - Abnormal; Notable for the following:    Preg Test, Ur POSITIVE (*)    All other components within normal limits  LIPASE, BLOOD  HCG, QUANTITATIVE, PREGNANCY  ABO/RH   No results found.   1. Ruptured ectopic pregnancy       MDM  The patient is able to walk to the bathroom, she has right greater than left abdominal tenderness but with the pain to her  shoulder and the right side of her neck I am concerned for diaphragmatic irritation either from ectopic pregnancy, ovarian cyst or intestinal or gallbladder issues on the right side of her abdomen. Will start with pregnancy test, urinalysis, lab work and an ultrasound of the pelvis to evaluate for ectopic pregnancy.   Preg positive - has ongoing sig pain - pulse of 105, BP 139 / 91, severe ttp on the RLQ and RUQ.  On my bedside US there is free fluid - this is ruptured ectopic until proven otherwise - paged GYN.  EMERGENCY DEPARTMENT [place] ABD/AORTA EXAM Study: Limited Ultrasound of the Abdominal Aorta.  INDICATIONS:Abdominal pain Indication: Multiple views of the abdominal aorta are obtained from the diaphragmatic hiatus  to the aortic bifurcation in transverse and sagittal planes with a multi- Frequency probe.  PERFORMED BY: Myself  IMAGES ARCHIVED?: Yes  FINDINGS: Free fluid present in RUQ and SP area.  LIMITATIONS:  Abdominal pain  INTERPRETATION:  Abdominal free fluid present  COMMENT:  Ruptured ectopic likely given free fluid in the RUQ and some in the SP region.  Dilaudid given for pain, IVF bolus.   Discussed with GYN Dr. Huntley Dec who [facility] 2 IVs have been placed, one of them by myself, multiple re\re evaluations, hemodynamic monitoring and IV fluid resuscitation and given. Ruptured ectopic pregnancy present most likely, critical care time provided, stat transfer limits at this time.  Angiocath insertion Performed by: Vida Roller  Consent: Verbal consent obtained. Risks and benefits: risks, benefits and alternatives were discussed Time out: Immediately prior to procedure a "time out" was called to verify the correct patient, procedure, equipment, support staff and site/side marked as required.  Preparation: Patient was prepped and draped in the usual sterile fashion.  Vein Location:  R hand  Not Ultrasound  Guided  Gauge: 20  Normal blood return and flush without difficulty Patient tolerance: Patient tolerated the procedure well with no immediate complications.  CRITICAL CARE Performed by: Vida Roller Total critical care time: 35 Critical care time was exclusive of separately billable procedures and treating other patients. Critical care was necessary to treat or prevent imminent or life-threatening deterioration. Critical care was time spent personally by me on the following activities: development of treatment plan with patient and/or surrogate as well as nursing, discussions with consultants, evaluation of patient's response to treatment, examination of patient, obtaining history from patient or surrogate, ordering and performing treatments and interventions, ordering and review of laboratory studies, ordering and review of radiographic studies, pulse oximetry and re-evaluation of patient's condition.     Vida Roller, MD 09/16/12 [phone]

## 2012-09-17 ENCOUNTER — Encounter (HOSPITAL_COMMUNITY): Payer: Self-pay | Admitting: Obstetrics and Gynecology

## 2012-09-17 NOTE — Op Note (Signed)
NAMENOELE, ICENHOUR NO.:  [id number]  MEDICAL RECORD NO.:  [id number]  LOCATION:  [facility]                          FACILITY:  [facility]  PHYSICIAN:  Guy Sandifer. Henderson Cloud, M.D. DATE OF BIRTH:  1991/02/26  DATE OF PROCEDURE:  09/16/2012 DATE OF DISCHARGE:  09/17/2012                              OPERATIVE REPORT   PREOPERATIVE DIAGNOSIS:  Ectopic pregnancy.  POSTOPERATIVE DIAGNOSIS:  Ruptured proximal right ectopic pregnancy.  PROCEDURE:  Laparoscopy with right salpingectomy and extensive evacuation of hematoma.  SURGEON:  Guy Sandifer. Henderson Cloud, M.D.  ANESTHESIA:  General endotracheal intubation, Belva Agee, M.D.  ESTIMATED BLOOD LOSS:  250 mL.  SPECIMENS:  Right fallopian tube and portion of the clot to Pathology.  INDICATIONS AND CONSENT:  This patient is a 22 year old white female, G1, P0 with a last normal period of August 11, 2012.  She had some brown staining several days ago.  She complains of the sudden onset of central pelvic to right lower quadrant pain at 6:30 this morning.  Pain progressed and intensified and gradually became right shoulder and neck pain as well.  She had no syncope, no dizziness, no persistent nausea, vomiting, or fever.  She presented to the [facility] Emergency Department, where she was found to have a quantitative HCG of approximately 828. Bedside ultrasound consistent with intra-abdominal clot.  A tender lower abdomen without rebound and stable vital signs.  She was transferred to [facility].  Portable ultrasound by Radiology confirmed a large amount of clot in the pelvis and an empty uterus.  Adnexal mass could not definitely be identified.  Laparoscopy, possible laparotomy, possible salpingectomy is discussed with the patient and her mother preoperatively.  Potential risks and complications were reviewed as well including but not limited to, infection, organ damage, bleeding requiring transfusion of blood products with HIV and  hepatitis acquisition, DVT, PE, pneumonia, recurrent ectopic pregnancy, pelvic pain, abdominal pain, or pain with intercourse.  All questions were answered and consent was signed on the chart.  FINDINGS:  There is at least 250 mL of clot in the pelvis with some blood in the right gutter and under the right hemidiaphragm.  The right fallopian tube has a point of rupture in the proximal 1/3rd approximately 1-2 cm from the uterine fundus.  This is bleeding moderately.  The right and left ovary appeared normal and the left fallopian tube appears normal.  After evacuation of the clot, the anterior and posterior cul-de-sac appeared normal as well.  PROCEDURE:  The patient was taken to the operating room, where she was identified, placed in dorsal supine position and general anesthesia was induced with endotracheal intubation.  She was placed in the dorsal lithotomy position.  Time-out undertaken.  She was prepped abdominally and vaginally.  Bladder straight catheterized and a Hulka tenaculum was placed in the uterus as a manipulator.  She was draped in sterile fashion.  The infraumbilical and suprapubic areas were injected in the midline with approximately 6 mL of 0.5% plain Marcaine.  Small infraumbilical incision was made.  Towel clips were used at the 3 and 9 o'clock position to the umbilicus to elevate the anterior abdominal wall and a Hulka tenaculum was  placed.  Good syringe and drop test were noted and 2 L of gas were insufflated under low pressure with good tympany in the right upper quadrant.  Veress needle was removed.  A 10/11 XL bladeless disposable trocar sleeve was then placed using direct visualization with the diagnostic laparoscope.  It was then switched to the operative laparoscope.  Small suprapubic incision was made and a 5 mm XL bladeless disposable trocar sleeve was placed under direct visualization without difficulty.  The above was noted.  The clot was cleared away  enough to clearly visualize both tubes.  Because of the continuing bleeding of the fallopian tube as well as the proximal portion of the tube that was disrupted, salpingectomy was then carried out with the enseal bipolar cautery cutting instrument.  This started at the proximal portion of the tube, progressing along the mesosalpinx and under good visualization.  The fallopian tube was then placed in the anterior cul-de-sac.  Then using a 5 mm laparoscope through the suprapubic trocar sleeve, a EndoCatch was placed through the umbilical trocar sleeve and the fallopian tube was retrieved without difficulty under visualization to the 5 scope.  The 10/12 trocar sleeve was then replaced under direct visualization.  Switching back to the operative laparoscope and using the 5 mm evacuator, attempts were made at evacuating the clot.  However,there was too much organized clot. Therefore, the suprapubic trocar sleeve was removed.  The incision was widened slightly and a 10 mm XL bladeless disposable trocar sleeve was placed under direct visualization.  Then using the 10 mm evacuator with irrigation, excess blood and clot was removed.  About 1/2 to 2/3rd was removed this way.  The remainder of the clot was retrieved from the cul- de-sac and placed anteriorly where it can be visualized.  Then using the operative laparoscope through this suprapubic trocar sleeve, the EndoCatch was then placed through the umbilical incision and a large part of the remainder of the clot was then retrieved in the bag.  This was sent to Pathology along with the fallopian tube.  The trocar sleeve was again replaced under direct visualization.  Copious irrigation was carried out and inspection under reduced pneumoperitoneum reveals good hemostasis at the point of salpingectomy.  Interceed was back loaded to the laparoscope and placed over the area of surgery.  It was moistened with remaining 24 mL of 0.5% plain Marcaine.   Suprapubic trocar sleeve was removed.  Pneumoperitoneum was reduced and the umbilical trocar sleeve was removed.  The patient was leveled out.  Under good visualization, the anterior fascia in the umbilical incision was grasped and reapproximated with 0 Vicryl suture.  A 2-0 Vicryl was then used in interrupted fashion to close both skin incisions.  Dermabond was applied to both.  Hulka tenaculum was removed intact with no bleeding.  All counts were correct.  The patient was awakened, taken to recovery room in stable condition.     Guy Sandifer Henderson Cloud, M.D.     JET/MEDQ  D:  09/16/2012  T:  09/17/2012  Job:  [id number]

## 2012-09-17 NOTE — Anesthesia Postprocedure Evaluation (Signed)
  Anesthesia Post-op Note  Patient: Alexis Ellis  Procedure(s) Performed: Procedure(s) with comments: LAPAROSCOPY OPERATIVE (N/A) - Evacuation Hemoperitoneum UNILATERAL SALPINGECTOMY (Right) Patient is awake and responsive. Pain and nausea are reasonably well controlled. Vital signs are stable and clinically acceptable. Oxygen saturation is clinically acceptable. There are no apparent anesthetic complications at this time. Patient is ready for discharge.

## 2012-09-19 LAB — TYPE AND SCREEN
ABO/RH(D): A POS
Antibody Screen: NEGATIVE
Unit division: 0
Unit division: 0

## 2012-09-30 ENCOUNTER — Inpatient Hospital Stay (HOSPITAL_COMMUNITY)
Admission: AD | Admit: 2012-09-30 | Discharge: 2012-09-30 | Disposition: A | Discharge status: Home / Self Care | Payer: BC Managed Care – PPO | Source: Ambulatory Visit | Attending: Obstetrics and Gynecology | Admitting: Obstetrics and Gynecology

## 2012-09-30 DIAGNOSIS — O009 Unspecified ectopic pregnancy without intrauterine pregnancy: Secondary | ICD-10-CM

## 2012-09-30 DIAGNOSIS — O00109 Unspecified tubal pregnancy without intrauterine pregnancy: Secondary | ICD-10-CM | POA: Insufficient documentation

## 2012-09-30 DIAGNOSIS — Z09 Encounter for follow-up examination after completed treatment for conditions other than malignant neoplasm: Secondary | ICD-10-CM | POA: Insufficient documentation

## 2012-09-30 LAB — CBC
HCT: 38.9 % (ref 36.0–46.0)
Hemoglobin: 13.9 g/dL (ref 12.0–15.0)
MCH: 31.3 pg (ref 26.0–34.0)
MCHC: 35.7 g/dL (ref 30.0–36.0)
MCV: 87.6 fL (ref 78.0–100.0)
Platelets: 334 10*3/uL (ref 150–400)
RBC: 4.44 MIL/uL (ref 3.87–5.11)
RDW: 12.8 % (ref 11.5–15.5)
WBC: 8.1 10*3/uL (ref 4.0–10.5)

## 2012-09-30 LAB — COMPREHENSIVE METABOLIC PANEL
ALT: 32 U/L (ref 0–35)
AST: 19 U/L (ref 0–37)
Albumin: 4 g/dL (ref 3.5–5.2)
Alkaline Phosphatase: 83 U/L (ref 39–117)
BUN: 10 mg/dL (ref 6–23)
CO2: 30 mEq/L (ref 19–32)
Calcium: 10.1 mg/dL (ref 8.4–10.5)
Chloride: 101 mEq/L (ref 96–112)
Creatinine, Ser: 0.88 mg/dL (ref 0.50–1.10)
GFR calc Af Amer: >90 mL/min (ref >90)
GFR calc non Af Amer: >90 mL/min (ref >90)
Glucose, Bld: 76 mg/dL (ref 70–99)
Potassium: 4.2 mEq/L (ref 3.5–5.1)
Sodium: 138 mEq/L (ref 135–145)
Total Bilirubin: 0.7 mg/dL (ref 0.3–1.2)
Total Protein: 6.8 g/dL (ref 6.0–8.3)

## 2012-09-30 LAB — HCG, QUANTITATIVE, PREGNANCY: hCG, Beta Chain, Quant, S: 177 m[IU]/mL — ABNORMAL HIGH (ref <5)

## 2012-09-30 MED ORDER — METHOTREXATE INJECTION FOR WOMEN'S HOSPITAL
50.0000 mg/m2 | Freq: Once | INTRAMUSCULAR | Status: AC
Start: 2012-09-30 — End: 2012-09-30
  Administered 2012-09-30: 100 mg via INTRAMUSCULAR
  Filled 2012-09-30: qty 2

## 2012-09-30 NOTE — MAU Note (Signed)
MTX handout given and reviewed/questions answered. Pt taken to radiology waiting area.

## 2012-09-30 NOTE — MAU Provider Note (Signed)
History     CSN: [id number]  Arrival date and time: 09/30/12 1646   None     No chief complaint on file.  HPI This is a 22 y.o. female who is S/P Laparoscopy for a ruptured right ectopic pregnancy. She has done well since surgery but recent measurements of Quant. HCG have shown elevations in the levels.  So she is here today for a Methotrexate shot. She was examined by Dr Arelia Sneddon today in the office.  RN Note: Pt states saw Dr. Arelia Sneddon in office today. U/S repeated, hormone level taken yesterday. May 7th had surgery to remove ectopic pregnancy. Hormone levels were going down, then today began increasing. 487, 95, then today it was 180. Not bleeding at present. Having lower abd pain, rates 4/10.       OB History   Grav Para Term Preterm Abortions TAB SAB Ect Mult Living   1               Past Medical History  Diagnosis Date  . ADD (attention deficit disorder)   . ADD (attention deficit disorder)     Past Surgical History  Procedure Laterality Date  . Wisedom teeth    . No past surgeries    . Laparoscopy N/A 09/16/2012    Procedure: LAPAROSCOPY OPERATIVE;  Surgeon: Leslie Andrea, MD;  Location: [facility];  Service: Gynecology;  Laterality: N/A;  Evacuation Hemoperitoneum  . Unilateral salpingectomy Right 09/16/2012    Procedure: UNILATERAL SALPINGECTOMY;  Surgeon: Leslie Andrea, MD;  Location: [facility];  Service: Gynecology;  Laterality: Right;    Family History  Problem Relation Age of Onset  . Hypertension Mother   . Diabetes Father   . Heart attack Father   . Sudden death Father   . Hyperlipidemia Neg Hx   . Bipolar disorder Maternal Grandmother   . Diabetes Maternal Grandfather   . Alcohol abuse Paternal Grandfather   . Liver disease Paternal Grandfather     History  Substance Use Topics  . Smoking status: Current Every Day Smoker -- 0.50 packs/day    Last Attempt to Quit: 08/21/2011  . Smokeless tobacco: Never Used  . Alcohol Use: 1.2 oz/week    2  Glasses of wine per week    Allergies: No Known Allergies  Prescriptions prior to admission  Medication Sig Dispense Refill  . amphetamine-dextroamphetamine (ADDERALL) 20 MG tablet Take 20 mg by mouth 4 (four) times daily.       . diazepam (VALIUM) 2 MG tablet Take 2 mg by mouth every 6 (six) hours as needed for anxiety.      Marland Kitchen oxyCODONE-acetaminophen (ROXICET) 5-325 MG per tablet Take 1-2 tablets by mouth every 6 (six) hours as needed for pain.  30 tablet  0  . traZODone (DESYREL) 50 MG tablet Take 50 mg by mouth at bedtime as needed for sleep.         Review of Systems  Constitutional: Negative for fever, chills and malaise/fatigue.  Cardiovascular: Negative for chest pain.  Gastrointestinal: Positive for abdominal pain. Negative for nausea, vomiting, diarrhea and constipation.  Genitourinary: Negative for dysuria.  Neurological: Negative for dizziness, weakness and headaches.   Physical Exam   Blood pressure 109/71, pulse 92, temperature 97.8 F (36.6 C), temperature source Oral, resp. rate 16, height 5\' 5"  (1.651 m), weight 90.493 kg (199 lb 8 oz), last menstrual period 08/11/2012.  Physical Exam  Constitutional: She is oriented to person, place, and time. She appears well-developed and  well-nourished. No distress (but tearful).  Cardiovascular: Normal rate, regular rhythm and normal heart sounds.  Exam reveals no gallop and no friction rub.   No murmur heard. Respiratory: Effort normal and breath sounds normal. No respiratory distress. She has no wheezes. She has no rales.  GI: Soft. There is no tenderness. There is no rebound and no guarding.  Musculoskeletal: Normal range of motion.  Neurological: She is alert and oriented to person, place, and time.  Skin: Skin is warm and dry.  Psychiatric: She has a normal mood and affect.    MAU Course  Procedures  Assessment and Plan  A:  S/P surgery for ruptured right ectopic pregnancy      Rising Quant HCG levels  P:  Per Dr  Arelia Sneddon and Dr Marcelle Overlie, will give Methotrexate per protocol      Followup Day 4 and 7 Quants.      Precautions reviewed.  Wynelle Bourgeois 09/30/2012, 6:28 PM

## 2012-09-30 NOTE — MAU Note (Signed)
Pt states saw Dr. Arelia Sneddon in office today. U/S repeated, hormone level taken yesterday. May 7th had surgery to remove ectopic pregnancy. Hormone levels were going down, then today began increasing. 487, 95, then today it was 180. Not bleeding at present. Having lower abd pain, rates 4/10.

## 2013-05-13 DIAGNOSIS — Z9079 Acquired absence of other genital organ(s): Secondary | ICD-10-CM

## 2013-07-22 ENCOUNTER — Encounter (HOSPITAL_COMMUNITY): Payer: Self-pay | Admitting: *Deleted

## 2013-10-28 ENCOUNTER — Ambulatory Visit (INDEPENDENT_AMBULATORY_CARE_PROVIDER_SITE_OTHER): Payer: BC Managed Care – PPO | Admitting: Physician Assistant

## 2013-10-28 ENCOUNTER — Encounter: Payer: Self-pay | Admitting: *Deleted

## 2013-10-28 VITALS — BP 132/84 | HR 104 | Temp 98.5°F | Resp 16 | Ht 65.0 in | Wt 226.8 lb

## 2013-10-28 DIAGNOSIS — Z136 Encounter for screening for cardiovascular disorders: Secondary | ICD-10-CM

## 2013-10-28 DIAGNOSIS — IMO0001 Reserved for inherently not codable concepts without codable children: Secondary | ICD-10-CM

## 2013-10-28 DIAGNOSIS — R030 Elevated blood-pressure reading, without diagnosis of hypertension: Secondary | ICD-10-CM

## 2013-10-28 NOTE — Patient Instructions (Signed)
Please return for labs.  The lab reopens at 1:30 today or you can return to in the morning at 8 AM for labs. Read information below on the DASH diet.  Monitor BP daily and record your results and bring to visit on Monday.  Decrease your Adderall to three times daily.  This will help with heart rate and jitteriness.  Take your Lorazepam at bedtime as anxiety is playing a component.  If you develop chest pain or shortness of breath, please proceed to the ER.  DASH Eating Plan DASH stands for "Dietary Approaches to Stop Hypertension." The DASH eating plan is a healthy eating plan that has been shown to reduce high blood pressure (hypertension). Additional health benefits may include reducing the risk of type 2 diabetes mellitus, heart disease, and stroke. The DASH eating plan may also help with weight loss. WHAT DO I NEED TO KNOW ABOUT THE DASH EATING PLAN? For the DASH eating plan, you will follow these general guidelines:  Choose foods with a percent daily value for sodium of less than 5% (as listed on the food label).  Use salt-free seasonings or herbs instead of table salt or sea salt.  Check with your health care provider or pharmacist before using salt substitutes.  Eat lower-sodium products, often labeled as "lower sodium" or "no salt added."  Eat fresh foods.  Eat more vegetables, fruits, and low-fat dairy products.  Choose whole grains. Look for the word "whole" as the first word in the ingredient list.  Choose fish and skinless chicken or [place] more often than red meat. Limit fish, poultry, and meat to 6 oz (170 g) each day.  Limit sweets, desserts, sugars, and sugary drinks.  Choose heart-healthy fats.  Limit cheese to 1 oz (28 g) per day.  Eat more home-cooked food and less restaurant, buffet, and fast food.  Limit fried foods.  Cook foods using methods other than frying.  Limit canned vegetables. If you do use them, rinse them well to decrease the sodium.  When eating  at a restaurant, ask that your food be prepared with less salt, or no salt if possible. WHAT FOODS CAN I EAT? Seek help from a dietitian for individual calorie needs. Grains Whole grain or whole wheat bread. Brown rice. Whole grain or whole wheat pasta. Quinoa, bulgur, and whole grain cereals. Low-sodium cereals. Corn or whole wheat flour tortillas. Whole grain cornbread. Whole grain crackers. Low-sodium crackers. Vegetables Fresh or frozen vegetables (raw, steamed, roasted, or grilled). Low-sodium or reduced-sodium tomato and vegetable juices. Low-sodium or reduced-sodium tomato sauce and paste. Low-sodium or reduced-sodium canned vegetables.  Fruits All fresh, canned (in natural juice), or frozen fruits. Meat and Other Protein Products Ground beef (85% or leaner), grass-fed beef, or beef trimmed of fat. Skinless chicken or [place]. Ground chicken or [place]. Pork trimmed of fat. All fish and seafood. Eggs. Dried beans, peas, or lentils. Unsalted nuts and seeds. Unsalted canned beans. Dairy Low-fat dairy products, such as skim or 1% milk, 2% or reduced-fat cheeses, low-fat ricotta or cottage cheese, or plain low-fat yogurt. Low-sodium or reduced-sodium cheeses. Fats and Oils Tub margarines without trans fats. Light or reduced-fat mayonnaise and salad dressings (reduced sodium). Avocado. Safflower, olive, or canola oils. Natural peanut or almond butter. Other Unsalted popcorn and pretzels. The items listed above may not be a complete list of recommended foods or beverages. Contact your dietitian for more options. WHAT FOODS ARE NOT RECOMMENDED? Grains White bread. White pasta. White rice. Refined cornbread. Bagels and  croissants. Crackers that contain trans fat. Vegetables Creamed or fried vegetables. Vegetables in a cheese sauce. Regular canned vegetables. Regular canned tomato sauce and paste. Regular tomato and vegetable juices. Fruits Dried fruits. Canned fruit in light or heavy syrup.  Fruit juice. Meat and Other Protein Products Fatty cuts of meat. Ribs, chicken wings, bacon, sausage, bologna, salami, chitterlings, fatback, hot dogs, bratwurst, and packaged luncheon meats. Salted nuts and seeds. Canned beans with salt. Dairy Whole or 2% milk, cream, half-and-half, and cream cheese. Whole-fat or sweetened yogurt. Full-fat cheeses or blue cheese. Nondairy creamers and whipped toppings. Processed cheese, cheese spreads, or cheese curds. Condiments Onion and garlic salt, seasoned salt, table salt, and sea salt. Canned and packaged gravies. Worcestershire sauce. Tartar sauce. Barbecue sauce. Teriyaki sauce. Soy sauce, including reduced sodium. Steak sauce. Fish sauce. Oyster sauce. Cocktail sauce. Horseradish. Ketchup and mustard. Meat flavorings and tenderizers. Bouillon cubes. Hot sauce. Tabasco sauce. Marinades. Taco seasonings. Relishes. Fats and Oils Butter, stick margarine, lard, shortening, ghee, and bacon fat. Coconut, palm kernel, or palm oils. Regular salad dressings. Other Pickles and olives. Salted popcorn and pretzels. The items listed above may not be a complete list of foods and beverages to avoid. Contact your dietitian for more information. WHERE CAN I FIND MORE INFORMATION? National Heart, Lung, and Blood Institute: travelstabloid.com Document Released: 04/18/2011 Document Revised: 05/04/2013 Document Reviewed: 03/03/2013 [facility] Patient Information 2015 [place], [place]. This information is not intended to replace advice given to you by your health care provider. Make sure you discuss any questions you have with your health care provider.

## 2013-10-28 NOTE — Progress Notes (Signed)
Pre visit review using our clinic review tool, if applicable. No additional management support is needed unless otherwise documented below in the visit note/SLS  

## 2013-10-29 ENCOUNTER — Telehealth: Payer: Self-pay | Admitting: Physician Assistant

## 2013-10-29 NOTE — Telephone Encounter (Signed)
Relevant patient education mailed to patient.  

## 2013-11-01 ENCOUNTER — Other Ambulatory Visit: Payer: Self-pay | Admitting: Physician Assistant

## 2013-11-01 ENCOUNTER — Encounter: Payer: Self-pay | Admitting: Physician Assistant

## 2013-11-01 ENCOUNTER — Ambulatory Visit (INDEPENDENT_AMBULATORY_CARE_PROVIDER_SITE_OTHER): Payer: BC Managed Care – PPO | Admitting: Physician Assistant

## 2013-11-01 VITALS — BP 124/86 | HR 108 | Temp 98.3°F | Resp 16 | Ht 65.0 in | Wt 222.8 lb

## 2013-11-01 DIAGNOSIS — Z0000 Encounter for general adult medical examination without abnormal findings: Secondary | ICD-10-CM

## 2013-11-01 DIAGNOSIS — F411 Generalized anxiety disorder: Secondary | ICD-10-CM

## 2013-11-01 DIAGNOSIS — E785 Hyperlipidemia, unspecified: Secondary | ICD-10-CM

## 2013-11-01 DIAGNOSIS — R030 Elevated blood-pressure reading, without diagnosis of hypertension: Secondary | ICD-10-CM

## 2013-11-01 DIAGNOSIS — O163 Unspecified maternal hypertension, third trimester: Secondary | ICD-10-CM | POA: Insufficient documentation

## 2013-11-01 DIAGNOSIS — F988 Other specified behavioral and emotional disorders with onset usually occurring in childhood and adolescence: Secondary | ICD-10-CM

## 2013-11-01 DIAGNOSIS — IMO0001 Reserved for inherently not codable concepts without codable children: Secondary | ICD-10-CM

## 2013-11-01 LAB — URINALYSIS, ROUTINE W REFLEX MICROSCOPIC
Bilirubin Urine: NEGATIVE
Glucose, UA: NEGATIVE mg/dL
Hgb urine dipstick: NEGATIVE
Ketones, ur: NEGATIVE mg/dL
Leukocytes, UA: NEGATIVE
Nitrite: NEGATIVE
Protein, ur: NEGATIVE mg/dL
Specific Gravity, Urine: 1.027 (ref 1.005–1.030)
Urobilinogen, UA: 1 mg/dL (ref 0.0–1.0)
pH: 6 (ref 5.0–8.0)

## 2013-11-01 LAB — TSH: TSH: 0.702 u[IU]/mL (ref 0.350–4.500)

## 2013-11-01 LAB — BASIC METABOLIC PANEL
BUN: 9 mg/dL (ref 6–23)
CO2: 28 mEq/L (ref 19–32)
Calcium: 10.3 mg/dL (ref 8.4–10.5)
Chloride: 101 mEq/L (ref 96–112)
Creat: 0.81 mg/dL (ref 0.50–1.10)
Glucose, Bld: 106 mg/dL — ABNORMAL HIGH (ref 70–99)
Potassium: 4.4 mEq/L (ref 3.5–5.3)
Sodium: 138 mEq/L (ref 135–145)

## 2013-11-01 LAB — CBC WITH DIFFERENTIAL/PLATELET
Basophils Absolute: 0 10*3/uL (ref 0.0–0.1)
Basophils Relative: 0 % (ref 0–1)
Eosinophils Absolute: 0.2 10*3/uL (ref 0.0–0.7)
Eosinophils Relative: 2 % (ref 0–5)
HCT: 44.5 % (ref 36.0–46.0)
Hemoglobin: 16.1 g/dL — ABNORMAL HIGH (ref 12.0–15.0)
Lymphocytes Relative: 27 % (ref 12–46)
Lymphs Abs: 2.5 10*3/uL (ref 0.7–4.0)
MCH: 30.8 pg (ref 26.0–34.0)
MCHC: 36.2 g/dL — ABNORMAL HIGH (ref 30.0–36.0)
MCV: 85.1 fL (ref 78.0–100.0)
Monocytes Absolute: 0.6 10*3/uL (ref 0.1–1.0)
Monocytes Relative: 6 % (ref 3–12)
Neutro Abs: 6 10*3/uL (ref 1.7–7.7)
Neutrophils Relative %: 65 % (ref 43–77)
Platelets: 324 10*3/uL (ref 150–400)
RBC: 5.23 MIL/uL — ABNORMAL HIGH (ref 3.87–5.11)
RDW: 13.2 % (ref 11.5–15.5)
WBC: 9.2 10*3/uL (ref 4.0–10.5)

## 2013-11-01 LAB — LIPID PANEL
Cholesterol: 224 mg/dL — ABNORMAL HIGH (ref 0–200)
HDL: 32 mg/dL — ABNORMAL LOW (ref >39)
LDL Cholesterol: 164 mg/dL — ABNORMAL HIGH (ref 0–99)
Total CHOL/HDL Ratio: 7 Ratio
Triglycerides: 141 mg/dL (ref <150)
VLDL: 28 mg/dL (ref 0–40)

## 2013-11-01 LAB — HEPATIC FUNCTION PANEL
ALT: 43 U/L — ABNORMAL HIGH (ref 0–35)
AST: 25 U/L (ref 0–37)
Albumin: 4.7 g/dL (ref 3.5–5.2)
Alkaline Phosphatase: 85 U/L (ref 39–117)
Bilirubin, Direct: 0.1 mg/dL (ref 0.0–0.3)
Indirect Bilirubin: 0.5 mg/dL (ref 0.2–1.2)
Total Bilirubin: 0.6 mg/dL (ref 0.2–1.2)
Total Protein: 7.3 g/dL (ref 6.0–8.3)

## 2013-11-01 LAB — HEMOGLOBIN A1C
Hgb A1c MFr Bld: 5.1 % (ref <5.7)
Mean Plasma Glucose: 100 mg/dL (ref <117)

## 2013-11-01 NOTE — Patient Instructions (Signed)
Please obtain labs.  I will call you with your results.  Please decrease Adderall to twice daily and monitor symptoms.  Your blood pressure has improved.  Continue the DASh diet.  Follow-up in 1 month.  DASH Eating Plan DASH stands for "Dietary Approaches to Stop Hypertension." The DASH eating plan is a healthy eating plan that has been shown to reduce high blood pressure (hypertension). Additional health benefits may include reducing the risk of type 2 diabetes mellitus, heart disease, and stroke. The DASH eating plan may also help with weight loss. WHAT DO I NEED TO KNOW ABOUT THE DASH EATING PLAN? For the DASH eating plan, you will follow these general guidelines:  Choose foods with a percent daily value for sodium of less than 5% (as listed on the food label).  Use salt-free seasonings or herbs instead of table salt or sea salt.  Check with your health care provider or pharmacist before using salt substitutes.  Eat lower-sodium products, often labeled as "lower sodium" or "no salt added."  Eat fresh foods.  Eat more vegetables, fruits, and low-fat dairy products.  Choose whole grains. Look for the word "whole" as the first word in the ingredient list.  Choose fish and skinless chicken or [place] more often than red meat. Limit fish, poultry, and meat to 6 oz (170 g) each day.  Limit sweets, desserts, sugars, and sugary drinks.  Choose heart-healthy fats.  Limit cheese to 1 oz (28 g) per day.  Eat more home-cooked food and less restaurant, buffet, and fast food.  Limit fried foods.  Cook foods using methods other than frying.  Limit canned vegetables. If you do use them, rinse them well to decrease the sodium.  When eating at a restaurant, ask that your food be prepared with less salt, or no salt if possible. WHAT FOODS CAN I EAT? Seek help from a dietitian for individual calorie needs. Grains Whole grain or whole wheat bread. Brown rice. Whole grain or whole wheat pasta.  Quinoa, bulgur, and whole grain cereals. Low-sodium cereals. Corn or whole wheat flour tortillas. Whole grain cornbread. Whole grain crackers. Low-sodium crackers. Vegetables Fresh or frozen vegetables (raw, steamed, roasted, or grilled). Low-sodium or reduced-sodium tomato and vegetable juices. Low-sodium or reduced-sodium tomato sauce and paste. Low-sodium or reduced-sodium canned vegetables.  Fruits All fresh, canned (in natural juice), or frozen fruits. Meat and Other Protein Products Ground beef (85% or leaner), grass-fed beef, or beef trimmed of fat. Skinless chicken or [place]. Ground chicken or [place]. Pork trimmed of fat. All fish and seafood. Eggs. Dried beans, peas, or lentils. Unsalted nuts and seeds. Unsalted canned beans. Dairy Low-fat dairy products, such as skim or 1% milk, 2% or reduced-fat cheeses, low-fat ricotta or cottage cheese, or plain low-fat yogurt. Low-sodium or reduced-sodium cheeses. Fats and Oils Tub margarines without trans fats. Light or reduced-fat mayonnaise and salad dressings (reduced sodium). Avocado. Safflower, olive, or canola oils. Natural peanut or almond butter. Other Unsalted popcorn and pretzels. The items listed above may not be a complete list of recommended foods or beverages. Contact your dietitian for more options. WHAT FOODS ARE NOT RECOMMENDED? Grains White bread. White pasta. White rice. Refined cornbread. Bagels and croissants. Crackers that contain trans fat. Vegetables Creamed or fried vegetables. Vegetables in a cheese sauce. Regular canned vegetables. Regular canned tomato sauce and paste. Regular tomato and vegetable juices. Fruits Dried fruits. Canned fruit in light or heavy syrup. Fruit juice. Meat and Other Protein Products Fatty cuts of meat. Ribs,  chicken wings, bacon, sausage, bologna, salami, chitterlings, fatback, hot dogs, bratwurst, and packaged luncheon meats. Salted nuts and seeds. Canned beans with salt. Dairy Whole or 2%  milk, cream, half-and-half, and cream cheese. Whole-fat or sweetened yogurt. Full-fat cheeses or blue cheese. Nondairy creamers and whipped toppings. Processed cheese, cheese spreads, or cheese curds. Condiments Onion and garlic salt, seasoned salt, table salt, and sea salt. Canned and packaged gravies. Worcestershire sauce. Tartar sauce. Barbecue sauce. Teriyaki sauce. Soy sauce, including reduced sodium. Steak sauce. Fish sauce. Oyster sauce. Cocktail sauce. Horseradish. Ketchup and mustard. Meat flavorings and tenderizers. Bouillon cubes. Hot sauce. Tabasco sauce. Marinades. Taco seasonings. Relishes. Fats and Oils Butter, stick margarine, lard, shortening, ghee, and bacon fat. Coconut, palm kernel, or palm oils. Regular salad dressings. Other Pickles and olives. Salted popcorn and pretzels. The items listed above may not be a complete list of foods and beverages to avoid. Contact your dietitian for more information. WHERE CAN I FIND MORE INFORMATION? National Heart, Lung, and Blood Institute: travelstabloid.com Document Released: 04/18/2011 Document Revised: 05/04/2013 Document Reviewed: 03/03/2013 [facility] Patient Information 2015 [place], [place]. This information is not intended to replace advice given to you by your health care provider. Make sure you discuss any questions you have with your health care provider.

## 2013-11-01 NOTE — Progress Notes (Signed)
Patient presents to clinic today to establish care. Patient is fasting for labs.  Acute Concerns: Elevated BP -- Patient endorses since starting DASH diet and decreasing Adderall dosing, her home BPS have improved.  BP normotensive in clinic today.  Asymptomatic.  Chronic Issues: ADD -- Patient comes to our practice on Adderall 20 mg QID.  Patient was seen last week for an acute visit with concerns of HTN.  Patient was noted to be tachycardic.  Adderall reduced to TID.  Patient endorses improvement in mood and in BP.  Pulse still mildly tachycardic.   Anxiety -- Controlled with Trazodone. Has old Rx for Lorazepam as needed.  Health Maintenance: Dental -- UP-to-date Vision --Up-to-date Immunizations -- Tetanus in 2011.  Past Medical History  Diagnosis Date  . ADD (attention deficit disorder)   . Frequent headaches   . Elevated blood pressure   . Edema of both legs   . Chicken pox   . Elevated cholesterol   . Ectopic pregnancy     Past Surgical History  Procedure Laterality Date  . Wisedom teeth    . Laparoscopy N/A 09/16/2012    Procedure: LAPAROSCOPY OPERATIVE;  Surgeon: Allena Katz, MD;  Location: [facility];  Service: Gynecology;  Laterality: N/A;  Evacuation Hemoperitoneum  . Unilateral salpingectomy Right 09/16/2012    Procedure: UNILATERAL SALPINGECTOMY;  Surgeon: Allena Katz, MD;  Location: [facility];  Service: Gynecology;  Laterality: Right;    Current Outpatient Prescriptions on File Prior to Visit  Medication Sig Dispense Refill  . amphetamine-dextroamphetamine (ADDERALL) 20 MG tablet Take 20 mg by mouth 4 (four) times daily.       Marland Kitchen ibuprofen (ADVIL,MOTRIN) 200 MG tablet Take 200 mg by mouth every 6 (six) hours as needed for headache.      Marland Kitchen LORazepam (ATIVAN) 1 MG tablet Take 1 mg by mouth every 8 (eight) hours as needed for anxiety.      . naproxen sodium (ANAPROX) 220 MG tablet Take 220 mg by mouth as needed.      . traZODone (DESYREL) 50 MG tablet Take 50 mg  by mouth at bedtime as needed for sleep.        No current facility-administered medications on file prior to visit.    No Known Allergies  Family History  Problem Relation Age of Onset  . Hypertension Mother     Living  . Diabetes Father 52    Deceased  . Heart attack Father   . Sudden death Father   . Hyperlipidemia Neg Hx   . Bipolar disorder Maternal Grandmother   . Diabetes Maternal Grandfather   . Alcohol abuse Paternal Grandfather   . Liver disease Paternal Grandfather   . Fibromyalgia Mother   . Healthy Sister     History   Social History  . Marital Status: Single    Spouse Name: N/A    Number of Children: N/A  . Years of Education: N/A   Occupational History  . Not on file.   Social History Main Topics  . Smoking status: Current Every Day Smoker -- 1.00 packs/day  . Smokeless tobacco: Never Used  . Alcohol Use: 1.2 oz/week    2 Glasses of wine per week  . Drug Use: No  . Sexual Activity: Yes    Birth Control/ Protection: None   Other Topics Concern  . Not on file   Social History Narrative  . No narrative on file    Review of Systems  Constitutional: Negative  for fever and weight loss.  HENT: Negative for ear discharge, ear pain, hearing loss and tinnitus.   Eyes: Negative for blurred vision, double vision, photophobia and pain.  Respiratory: Negative for cough and shortness of breath.   Cardiovascular: Negative for chest pain and palpitations.  Gastrointestinal: Positive for nausea and vomiting. Negative for heartburn, abdominal pain, diarrhea, constipation, blood in stool and melena.  Genitourinary: Positive for frequency. Negative for dysuria, urgency, hematuria and flank pain.  Neurological: Positive for headaches. Negative for dizziness and loss of consciousness.  Psychiatric/Behavioral: Positive for depression. Negative for suicidal ideas, hallucinations, memory loss and substance abuse. The patient is not nervous/anxious and does not have  insomnia.    BP 124/86  Pulse 108  Temp(Src) 98.3 F (36.8 C) (Oral)  Resp 16  Ht 5' 5"  (1.651 m)  Wt 222 lb 12 oz (101.039 kg)  BMI 37.07 kg/m2  SpO2 98%  LMP 10/23/2013  Physical Exam  Vitals reviewed. Constitutional: She is oriented to person, place, and time and well-developed, well-nourished, and in no distress.  HENT:  Head: Normocephalic and atraumatic.  Right Ear: External ear normal.  Left Ear: External ear normal.  Nose: Nose normal.  Mouth/Throat: Oropharynx is clear and moist. No oropharyngeal exudate.  Eyes: Conjunctivae are normal. Pupils are equal, round, and reactive to light.  Neck: Neck supple. No thyromegaly present.  Cardiovascular: Normal rate, regular rhythm, normal heart sounds and intact distal pulses.   Pulmonary/Chest: Effort normal and breath sounds normal. No respiratory distress. She has no wheezes. She has no rales. She exhibits no tenderness.  Abdominal: Soft. Bowel sounds are normal. She exhibits no distension and no mass. There is no tenderness. There is no rebound and no guarding.  Lymphadenopathy:    She has no cervical adenopathy.  Neurological: She is alert and oriented to person, place, and time.  Skin: Skin is warm and dry. No rash noted.  Psychiatric: Affect normal.    Recent Results (from the past 2160 hour(s))  CBC WITH DIFFERENTIAL     Status: Abnormal   Collection Time    11/01/13 10:51 AM      Result Value Ref Range   WBC 9.2  4.0 - 10.5 K/uL   RBC 5.23 (*) 3.87 - 5.11 MIL/uL   Hemoglobin 16.1 (*) 12.0 - 15.0 g/dL   HCT 44.5  36.0 - 46.0 %   MCV 85.1  78.0 - 100.0 fL   MCH 30.8  26.0 - 34.0 pg   MCHC 36.2 (*) 30.0 - 36.0 g/dL   RDW 13.2  11.5 - 15.5 %   Platelets 324  150 - 400 K/uL   Neutrophils Relative % 65  43 - 77 %   Neutro Abs 6.0  1.7 - 7.7 K/uL   Lymphocytes Relative 27  12 - 46 %   Lymphs Abs 2.5  0.7 - 4.0 K/uL   Monocytes Relative 6  3 - 12 %   Monocytes Absolute 0.6  0.1 - 1.0 K/uL   Eosinophils Relative 2   0 - 5 %   Eosinophils Absolute 0.2  0.0 - 0.7 K/uL   Basophils Relative 0  0 - 1 %   Basophils Absolute 0.0  0.0 - 0.1 K/uL   Smear Review Criteria for review not met    BASIC METABOLIC PANEL     Status: Abnormal   Collection Time    11/01/13 10:51 AM      Result Value Ref Range   Sodium 138  135 -  145 mEq/L   Potassium 4.4  3.5 - 5.3 mEq/L   Chloride 101  96 - 112 mEq/L   CO2 28  19 - 32 mEq/L   Glucose, Bld 106 (*) 70 - 99 mg/dL   BUN 9  6 - 23 mg/dL   Creat 0.81  0.50 - 1.10 mg/dL   Calcium 10.3  8.4 - 10.5 mg/dL  TSH     Status: None   Collection Time    11/01/13 10:51 AM      Result Value Ref Range   TSH 0.702  0.350 - 4.500 uIU/mL  HEMOGLOBIN A1C     Status: None   Collection Time    11/01/13 10:51 AM      Result Value Ref Range   Hemoglobin A1C 5.1  <5.7 %   Comment:                                                                            According to the ADA Clinical Practice Recommendations for 2011, when     HbA1c is used as a screening test:             >=6.5%   Diagnostic of Diabetes Mellitus                (if abnormal result is confirmed)           5.7-6.4%   Increased risk of developing Diabetes Mellitus           References:Diagnosis and Classification of Diabetes Mellitus,Diabetes     AFBX,0383,33(OVANV 1):S62-S69 and Standards of Medical Care in             Diabetes - 2011,Diabetes BTYO,0600,45 (Suppl 1):S11-S61.         Mean Plasma Glucose 100  <117 mg/dL  HEPATIC FUNCTION PANEL     Status: Abnormal   Collection Time    11/01/13 10:51 AM      Result Value Ref Range   Total Bilirubin 0.6  0.2 - 1.2 mg/dL   Bilirubin, Direct 0.1  0.0 - 0.3 mg/dL   Indirect Bilirubin 0.5  0.2 - 1.2 mg/dL   Alkaline Phosphatase 85  39 - 117 U/L   AST 25  0 - 37 U/L   ALT 43 (*) 0 - 35 U/L   Total Protein 7.3  6.0 - 8.3 g/dL   Albumin 4.7  3.5 - 5.2 g/dL  URINALYSIS, ROUTINE W REFLEX MICROSCOPIC     Status: Abnormal   Collection Time    11/01/13 10:51 AM       Result Value Ref Range   Color, Urine YELLOW  YELLOW   APPearance TURBID (*) CLEAR   Specific Gravity, Urine 1.027  1.005 - 1.030   pH 6.0  5.0 - 8.0   Glucose, UA NEG  NEG mg/dL   Bilirubin Urine NEG  NEG   Ketones, ur NEG  NEG mg/dL   Hgb urine dipstick NEG  NEG   Protein, ur NEG  NEG mg/dL   Urobilinogen, UA 1  0.0 - 1.0 mg/dL   Nitrite NEG  NEG   Leukocytes, UA NEG  NEG  LIPID PANEL     Status: Abnormal   Collection Time  11/01/13 10:51 AM      Result Value Ref Range   Cholesterol 224 (*) 0 - 200 mg/dL   Comment: ATP III Classification:           < 200        mg/dL        Desirable          200 - 239     mg/dL        Borderline High          >= 240        mg/dL        High         Triglycerides 141  <150 mg/dL   HDL 32 (*) >39 mg/dL   Total CHOL/HDL Ratio 7.0     VLDL 28  0 - 40 mg/dL   LDL Cholesterol 164 (*) 0 - 99 mg/dL   Comment:       Total Cholesterol/HDL Ratio:CHD Risk                            Coronary Heart Disease Risk Table                                            Men       Women              1/2 Average Risk              3.4        3.3                  Average Risk              5.0        4.4               2X Average Risk              9.6        7.1               3X Average Risk             23.4       11.0     Use the calculated Patient Ratio above and the CHD Risk table      to determine the patient's CHD Risk.     ATP III Classification (LDL):           < 100        mg/dL         Optimal          100 - 129     mg/dL         Near or Above Optimal          130 - 159     mg/dL         Borderline High          160 - 189     mg/dL         High           > 190        mg/dL         Very High        URINALYSIS, MICROSCOPIC ONLY     Status: None   Collection  Time    11/01/13 10:51 AM      Result Value Ref Range   Squamous Epithelial / LPF FEW  RARE   Crystals NONE SEEN  NONE SEEN   Casts NONE SEEN  NONE SEEN   WBC, UA 0-2  <3 WBC/hpf   RBC / HPF 0-2   <3 RBC/hpf   Bacteria, UA NONE SEEN  RARE    Assessment/Plan: Elevated BP Resolved with DASh diet and reduction in Adderall dosing.  Will continue to monitor.  ADD (attention deficit disorder) Reduce adderall to BID dosing.   Anxiety state, unspecified Continue current regimen.  Visit for preventive health examination History reviewed.  Patient due for pelvic exam. Will obtain fasting labs.

## 2013-11-01 NOTE — Progress Notes (Signed)
Patient presents to clinic today c/o elevated blood pressures over the past week.  Patient endorses BP as high as 150s/100s.  Denies personal history of hypertension.  Does endorse + family history of hypertension.  Patient denies chest pain, palpitations, lightheadedness or dizziness.  Does note occasional blurring of vision.  Patient also with history of ADD and is currently prescribed 20 mg Adderall QID by precious PCP.  Also taking Ativan prn for history of anxiety.  Patient also endorses swelling of her feet/anles, 3 times this year.    Past Medical History  Diagnosis Date  . ADD (attention deficit disorder)   . Frequent headaches   . Elevated blood pressure   . Edema of both legs   . Chicken pox   . Elevated cholesterol   . Ectopic pregnancy     Current Outpatient Prescriptions on File Prior to Visit  Medication Sig Dispense Refill  . amphetamine-dextroamphetamine (ADDERALL) 20 MG tablet Take 20 mg by mouth 4 (four) times daily.       . traZODone (DESYREL) 50 MG tablet Take 50 mg by mouth at bedtime as needed for sleep.        No current facility-administered medications on file prior to visit.    No Known Allergies  Family History  Problem Relation Age of Onset  . Hypertension Mother     Living  . Diabetes Father 21    Deceased  . Heart attack Father   . Sudden death Father   . Hyperlipidemia Neg Hx   . Bipolar disorder Maternal Grandmother   . Diabetes Maternal Grandfather   . Alcohol abuse Paternal Grandfather   . Liver disease Paternal Grandfather   . Fibromyalgia Mother   . Healthy Sister     History   Social History  . Marital Status: Single    Spouse Name: N/A    Number of Children: N/A  . Years of Education: N/A   Social History Main Topics  . Smoking status: Current Every Day Smoker -- 0.50 packs/day    Last Attempt to Quit: 08/21/2011  . Smokeless tobacco: Never Used  . Alcohol Use: 1.2 oz/week    2 Glasses of wine per week  . Drug Use: No  .  Sexual Activity: Yes    Birth Control/ Protection: None   Other Topics Concern  . None   Social History Narrative  . None   Review of Systems - See HPI.  All other ROS are negative.  BP 132/84  Pulse 104  Temp(Src) 98.5 F (36.9 C) (Oral)  Resp 16  Ht 5\' 5"  (1.651 m)  Wt 226 lb 12 oz (102.853 kg)  BMI 37.73 kg/m2  SpO2 99%  LMP 10/23/2013  Physical Exam  Vitals reviewed. Constitutional: She is oriented to person, place, and time and well-developed, well-nourished, and in no distress.  HENT:  Head: Normocephalic and atraumatic.  Right Ear: External ear normal.  Left Ear: External ear normal.  Nose: Nose normal.  Mouth/Throat: Oropharynx is clear and moist. No oropharyngeal exudate.  TM within normal limits bilaterally.  Eyes: Conjunctivae are normal. Pupils are equal, round, and reactive to light.  Neck: Neck supple.  Cardiovascular: Normal rate, regular rhythm, normal heart sounds and intact distal pulses.   Pulses:      Radial pulses are 2+ on the right side, and 2+ on the left side.       Dorsalis pedis pulses are 2+ on the right side, and 2+ on the left side.  Posterior tibial pulses are 2+ on the right side, and 2+ on the left side.  No noted peripheral edema on examination.  Pulmonary/Chest: Effort normal and breath sounds normal. No respiratory distress. She has no wheezes. She has no rales. She exhibits no tenderness.  Neurological: She is alert and oriented to person, place, and time.  Skin: Skin is warm and dry. No rash noted.  Psychiatric:  Anxious affect.   Assessment/Plan: Elevated BP BP normotensive in clinic today.  EKG obtained revealing NSR.  Adderall dosing is too much -- doses > 40 mg day rarely more effective per Up-to-Date.  Will decrease first to 20 mg TID and monitor symptoms. DASH diet encouraged.  Patient to check BP daily at home at write down in notebook.  Bring to CPE next week.  We will recheck BP at that time.

## 2013-11-01 NOTE — Progress Notes (Signed)
Pre visit review using our clinic review tool, if applicable. No additional management support is needed unless otherwise documented below in the visit note/SLS  

## 2013-11-01 NOTE — Assessment & Plan Note (Signed)
BP normotensive in clinic today.  EKG obtained revealing NSR.  Adderall dosing is too much -- doses > 40 mg day rarely more effective per Up-to-Date.  Will decrease first to 20 mg TID and monitor symptoms. DASH diet encouraged.  Patient to check BP daily at home at write down in notebook.  Bring to CPE next week.  We will recheck BP at that time.

## 2013-11-02 LAB — URINALYSIS, MICROSCOPIC ONLY
Bacteria, UA: NONE SEEN
Casts: NONE SEEN
Crystals: NONE SEEN

## 2013-11-07 DIAGNOSIS — F988 Other specified behavioral and emotional disorders with onset usually occurring in childhood and adolescence: Secondary | ICD-10-CM | POA: Insufficient documentation

## 2013-11-07 DIAGNOSIS — F32A Depression, unspecified: Secondary | ICD-10-CM | POA: Insufficient documentation

## 2013-11-07 DIAGNOSIS — F329 Major depressive disorder, single episode, unspecified: Secondary | ICD-10-CM | POA: Insufficient documentation

## 2013-11-07 DIAGNOSIS — F419 Anxiety disorder, unspecified: Secondary | ICD-10-CM

## 2013-11-07 DIAGNOSIS — Z0000 Encounter for general adult medical examination without abnormal findings: Secondary | ICD-10-CM | POA: Insufficient documentation

## 2013-11-07 NOTE — Assessment & Plan Note (Signed)
History reviewed.  Patient due for pelvic exam. Will obtain fasting labs.

## 2013-11-07 NOTE — Assessment & Plan Note (Signed)
Reduce adderall to BID dosing.

## 2013-11-07 NOTE — Assessment & Plan Note (Signed)
Resolved with DASh diet and reduction in Adderall dosing.  Will continue to monitor.

## 2013-11-07 NOTE — Assessment & Plan Note (Signed)
Continue current regimen

## 2013-12-02 ENCOUNTER — Ambulatory Visit: Payer: BC Managed Care – PPO | Admitting: Physician Assistant

## 2014-01-12 ENCOUNTER — Other Ambulatory Visit: Payer: Self-pay | Admitting: Obstetrics and Gynecology

## 2014-01-13 LAB — CYTOLOGY - PAP

## 2014-01-19 ENCOUNTER — Ambulatory Visit (INDEPENDENT_AMBULATORY_CARE_PROVIDER_SITE_OTHER): Payer: 59

## 2014-01-19 ENCOUNTER — Ambulatory Visit (INDEPENDENT_AMBULATORY_CARE_PROVIDER_SITE_OTHER): Payer: 59 | Admitting: Family Medicine

## 2014-01-19 ENCOUNTER — Ambulatory Visit: Payer: 59

## 2014-01-19 VITALS — BP 122/80 | HR 80 | Temp 97.9°F | Resp 16 | Ht 64.5 in | Wt 223.0 lb

## 2014-01-19 DIAGNOSIS — S161XXA Strain of muscle, fascia and tendon at neck level, initial encounter: Secondary | ICD-10-CM

## 2014-01-19 DIAGNOSIS — M542 Cervicalgia: Secondary | ICD-10-CM

## 2014-01-19 DIAGNOSIS — S139XXA Sprain of joints and ligaments of unspecified parts of neck, initial encounter: Secondary | ICD-10-CM

## 2014-01-19 MED ORDER — CYCLOBENZAPRINE HCL 10 MG PO TABS: 10.0000 mg | ORAL_TABLET | Freq: Two times a day (BID) | ORAL | Status: DC | PRN
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
## 2014-01-19 NOTE — Patient Instructions (Signed)
Use the muscle relaxer as needed- it will make you drowsy.  If a whole pill is too strong a 1/2 pill is ok too.  Let me know if you do not feel better in the next few days.

## 2014-07-17 ENCOUNTER — Inpatient Hospital Stay (HOSPITAL_COMMUNITY)
Admission: AD | Admit: 2014-07-17 | Discharge: 2014-07-17 | Disposition: A | Discharge status: Home / Self Care | Payer: 59 | Source: Ambulatory Visit | Attending: Obstetrics and Gynecology | Admitting: Obstetrics and Gynecology

## 2014-07-17 ENCOUNTER — Encounter (HOSPITAL_COMMUNITY): Payer: Self-pay | Admitting: *Deleted

## 2014-07-17 DIAGNOSIS — F17200 Nicotine dependence, unspecified, uncomplicated: Secondary | ICD-10-CM | POA: Diagnosis not present

## 2014-07-17 DIAGNOSIS — R109 Unspecified abdominal pain: Secondary | ICD-10-CM | POA: Diagnosis present

## 2014-07-17 DIAGNOSIS — I10 Essential (primary) hypertension: Secondary | ICD-10-CM | POA: Diagnosis not present

## 2014-07-17 DIAGNOSIS — B9689 Other specified bacterial agents as the cause of diseases classified elsewhere: Secondary | ICD-10-CM | POA: Insufficient documentation

## 2014-07-17 DIAGNOSIS — N760 Acute vaginitis: Secondary | ICD-10-CM | POA: Diagnosis not present

## 2014-07-17 DIAGNOSIS — G8929 Other chronic pain: Secondary | ICD-10-CM

## 2014-07-17 DIAGNOSIS — R1011 Right upper quadrant pain: Secondary | ICD-10-CM

## 2014-07-17 LAB — URINALYSIS, ROUTINE W REFLEX MICROSCOPIC
Bilirubin Urine: NEGATIVE
Glucose, UA: NEGATIVE mg/dL
Ketones, ur: NEGATIVE mg/dL
Leukocytes, UA: NEGATIVE
Nitrite: NEGATIVE
Protein, ur: NEGATIVE mg/dL
Specific Gravity, Urine: 1.03 (ref 1.005–1.030)
Urobilinogen, UA: 0.2 mg/dL (ref 0.0–1.0)
pH: 6 (ref 5.0–8.0)

## 2014-07-17 LAB — COMPREHENSIVE METABOLIC PANEL
ALT: 60 U/L — ABNORMAL HIGH (ref 0–35)
AST: 35 U/L (ref 0–37)
Albumin: 4.2 g/dL (ref 3.5–5.2)
Alkaline Phosphatase: 78 U/L (ref 39–117)
Anion gap: 8 (ref 5–15)
BUN: 14 mg/dL (ref 6–23)
CO2: 26 mmol/L (ref 19–32)
Calcium: 9.1 mg/dL (ref 8.4–10.5)
Chloride: 104 mmol/L (ref 96–112)
Creatinine, Ser: 0.7 mg/dL (ref 0.50–1.10)
GFR calc Af Amer: >90 mL/min (ref >90)
GFR calc non Af Amer: >90 mL/min (ref >90)
Glucose, Bld: 94 mg/dL (ref 70–99)
Potassium: 4 mmol/L (ref 3.5–5.1)
Sodium: 138 mmol/L (ref 135–145)
Total Bilirubin: 0.8 mg/dL (ref 0.3–1.2)
Total Protein: 6.8 g/dL (ref 6.0–8.3)

## 2014-07-17 LAB — CBC
HCT: 41.5 % (ref 36.0–46.0)
Hemoglobin: 14.6 g/dL (ref 12.0–15.0)
MCH: 30.9 pg (ref 26.0–34.0)
MCHC: 35.2 g/dL (ref 30.0–36.0)
MCV: 87.7 fL (ref 78.0–100.0)
Platelets: 286 10*3/uL (ref 150–400)
RBC: 4.73 MIL/uL (ref 3.87–5.11)
RDW: 12.6 % (ref 11.5–15.5)
WBC: 10.9 10*3/uL — ABNORMAL HIGH (ref 4.0–10.5)

## 2014-07-17 LAB — URINE MICROSCOPIC-ADD ON

## 2014-07-17 LAB — WET PREP, GENITAL
Trich, Wet Prep: NONE SEEN
Yeast Wet Prep HPF POC: NONE SEEN

## 2014-07-17 MED ORDER — METRONIDAZOLE 500 MG PO TABS: 500.0000 mg | ORAL_TABLET | Freq: Two times a day (BID) | ORAL | Status: DC

## 2014-07-17 MED ORDER — KETOROLAC TROMETHAMINE 60 MG/2ML IM SOLN
60.0000 mg | Freq: Once | INTRAMUSCULAR | Status: AC
  Administered 2014-07-17: 60 mg via INTRAMUSCULAR
  Filled 2014-07-17: qty 2

## 2014-07-17 NOTE — MAU Provider Note (Signed)
History     CSN: [id number]  Arrival date and time: 07/17/14 1925   First Provider Initiated Contact with Patient 07/17/14 2011      Chief Complaint  Patient presents with  . Abdominal Pain   HPI  [name] is a 24 y.o. here with report of brown discharge and right sided pelvic pain that started yesterday.  Pain is described as sharp and is intermittent in nature.  Pain is rated a 9/10.  LNMP on 05/27/14.  Report brown discharge that started yesterday.  Denies fever, body aches, or chills.  +nausea, "I have this all that time".  No report of vomiting.    Past Medical History  Diagnosis Date  . ADD (attention deficit disorder)   . Frequent headaches   . Elevated blood pressure   . Edema of both legs   . Chicken pox   . Elevated cholesterol   . Ectopic pregnancy     Past Surgical History  Procedure Laterality Date  . Wisedom teeth    . Laparoscopy N/A 09/16/2012    Procedure: LAPAROSCOPY OPERATIVE;  Surgeon: Allena Katz, MD;  Location: [facility];  Service: Gynecology;  Laterality: N/A;  Evacuation Hemoperitoneum  . Unilateral salpingectomy Right 09/16/2012    Procedure: UNILATERAL SALPINGECTOMY;  Surgeon: Allena Katz, MD;  Location: [facility];  Service: Gynecology;  Laterality: Right;    Family History  Problem Relation Age of Onset  . Hypertension Mother     Living  . Diabetes Father 32    Deceased  . Heart attack Father   . Sudden death Father   . Hyperlipidemia Neg Hx   . Bipolar disorder Maternal Grandmother   . Diabetes Maternal Grandfather   . Alcohol abuse Paternal Grandfather   . Liver disease Paternal Grandfather   . Fibromyalgia Mother   . Healthy Sister     History  Substance Use Topics  . Smoking status: Current Every Day Smoker -- 1.00 packs/day  . Smokeless tobacco: Never Used  . Alcohol Use: 1.2 oz/week    2 Glasses of wine per week    Allergies: No Known Allergies  Prescriptions prior to admission  Medication Sig Dispense  Refill Last Dose  . amphetamine-dextroamphetamine (ADDERALL) 20 MG tablet Take 20 mg by mouth 4 (four) times daily.    Taking  . cyclobenzaprine (FLEXERIL) 10 MG tablet Take 1 tablet (10 mg total) by mouth 2 (two) times daily as needed for muscle spasms. 30 tablet 0   . ibuprofen (ADVIL,MOTRIN) 200 MG tablet Take 200 mg by mouth every 6 (six) hours as needed for headache.   Not Taking  . LORazepam (ATIVAN) 1 MG tablet Take 1 mg by mouth every 8 (eight) hours as needed for anxiety.   Taking  . naproxen sodium (ANAPROX) 220 MG tablet Take 220 mg by mouth as needed.   Taking    Review of Systems  Constitutional: Negative for fever, chills and malaise/fatigue.  Respiratory: Negative for cough and shortness of breath.   Cardiovascular: Negative for chest pain.  Gastrointestinal: Positive for nausea and abdominal pain. Negative for vomiting, diarrhea and constipation.  Genitourinary: Negative for dysuria and urgency.  All other systems reviewed and are negative.  Physical Exam   Blood pressure 144/96, pulse 106, temperature 99.5 F (37.5 C), temperature source Oral, resp. rate 20, height 5\' 5"  (1.651 m), weight 227 lb 9.6 oz (103.239 kg), SpO2 100 %.  Physical Exam  Constitutional: She is oriented to person,  place, and time. She appears well-developed and well-nourished. No distress.  HENT:  Head: Normocephalic.  Neck: Normal range of motion. Neck supple.  Cardiovascular: Normal rate, regular rhythm and normal heart sounds.   Respiratory: Effort normal and breath sounds normal. No respiratory distress.  GI: Soft. She exhibits no mass. There is tenderness (RUQ). There is no guarding.    Genitourinary: Uterus is not enlarged and not tender. Right adnexum displays no mass and no tenderness. Left adnexum displays no mass and no tenderness. Vaginal discharge (dark brown) found.  Musculoskeletal: Normal range of motion. She exhibits no edema.  Neurological: She is alert and oriented to person,  place, and time. She has normal reflexes.  Skin: Skin is warm and dry.    MAU Course  Procedures   60 mg Toradol > pt reports relief in pain  Lab reporting down at this time:  Pregnancy test - negative  CMP:  ALT 60 (high) AST 35 (nml); all others wnl CBC: WBC 10.86 (nml)  Microscopic wet-mount exam shows clue cells.   2145 Consulted with Dr. Corinna Capra > Reviewed HPI/Exam/labs > follow-up with PCP/GI provider for long term nausea and vomiting and elevated ALT   Assessment and Plan  Abdominal Pain Bacterial Vaginosis Hypertension  Plan: Discharge to home RX Flagyl 500 mg BID x 7 days Schedule appointment with PCP/GI provider for follow-up on elevated liver enzyme and elevated blood pressure  Gwen Pounds, CNM

## 2014-07-17 NOTE — Discharge Instructions (Signed)
Abdominal Pain, Women °Abdominal (stomach, pelvic, or belly) pain can be caused by many things. It is important to tell your doctor: °· The location of the pain. °· Does it come and go or is it present all the time? °· Are there things that start the pain (eating certain foods, exercise)? °· Are there other symptoms associated with the pain (fever, nausea, vomiting, diarrhea)? °All of this is helpful to know when trying to find the cause of the pain. °CAUSES  °· Stomach: virus or bacteria infection, or ulcer. °· Intestine: appendicitis (inflamed appendix), regional ileitis (Crohn's disease), ulcerative colitis (inflamed colon), irritable bowel syndrome, diverticulitis (inflamed diverticulum of the colon), or cancer of the stomach or intestine. °· Gallbladder disease or stones in the gallbladder. °· Kidney disease, kidney stones, or infection. °· Pancreas infection or cancer. °· Fibromyalgia (pain disorder). °· Diseases of the female organs: °¨ Uterus: fibroid (non-cancerous) tumors or infection. °¨ Fallopian tubes: infection or tubal pregnancy. °¨ Ovary: cysts or tumors. °¨ Pelvic adhesions (scar tissue). °¨ Endometriosis (uterus lining tissue growing in the pelvis and on the pelvic organs). °¨ Pelvic congestion syndrome (female organs filling up with blood just before the menstrual period). °¨ Pain with the menstrual period. °¨ Pain with ovulation (producing an egg). °¨ Pain with an IUD (intrauterine device, birth control) in the uterus. °¨ Cancer of the female organs. °· Functional pain (pain not caused by a disease, may improve without treatment). °· Psychological pain. °· Depression. °DIAGNOSIS  °Your doctor will decide the seriousness of your pain by doing an examination. °· Blood tests. °· X-rays. °· Ultrasound. °· CT scan (computed tomography, special type of X-ray). °· MRI (magnetic resonance imaging). °· Cultures, for infection. °· Barium enema (dye inserted in the large intestine, to better view it with  X-rays). °· Colonoscopy (looking in intestine with a lighted tube). °· Laparoscopy (minor surgery, looking in abdomen with a lighted tube). °· Major abdominal exploratory surgery (looking in abdomen with a large incision). °TREATMENT  °The treatment will depend on the cause of the pain.  °· Many cases can be observed and treated at home. °· Over-the-counter medicines recommended by your caregiver. °· Prescription medicine. °· Antibiotics, for infection. °· Birth control pills, for painful periods or for ovulation pain. °· Hormone treatment, for endometriosis. °· Nerve blocking injections. °· Physical therapy. °· Antidepressants. °· Counseling with a psychologist or psychiatrist. °· Minor or major surgery. °HOME CARE INSTRUCTIONS  °· Do not take laxatives, unless directed by your caregiver. °· Take over-the-counter pain medicine only if ordered by your caregiver. Do not take aspirin because it can cause an upset stomach or bleeding. °· Try a clear liquid diet (broth or water) as ordered by your caregiver. Slowly move to a bland diet, as tolerated, if the pain is related to the stomach or intestine. °· Have a thermometer and take your temperature several times a day, and record it. °· Bed rest and sleep, if it helps the pain. °· Avoid sexual intercourse, if it causes pain. °· Avoid stressful situations. °· Keep your follow-up appointments and tests, as your caregiver orders. °· If the pain does not go away with medicine or surgery, you may try: °¨ Acupuncture. °¨ Relaxation exercises (yoga, meditation). °¨ Group therapy. °¨ Counseling. °SEEK MEDICAL CARE IF:  °· You notice certain foods cause stomach pain. °· Your home care treatment is not helping your pain. °· You need stronger pain medicine. °· You want your IUD removed. °· You feel faint or   lightheaded. °· You develop nausea and vomiting. °· You develop a rash. °· You are having side effects or an allergy to your medicine. °SEEK IMMEDIATE MEDICAL CARE IF:  °· Your  pain does not go away or gets worse. °· You have a fever. °· Your pain is felt only in portions of the abdomen. The right side could possibly be appendicitis. The left lower portion of the abdomen could be colitis or diverticulitis. °· You are passing blood in your stools (bright red or black tarry stools, with or without vomiting). °· You have blood in your urine. °· You develop chills, with or without a fever. °· You pass out. °MAKE SURE YOU:  °· Understand these instructions. °· Will watch your condition. °· Will get help right away if you are not doing well or get worse. °Document Released: 02/24/2007 Document Revised: 09/13/2013 Document Reviewed: 03/16/2009 °ExitCare® Patient Information ©2015 [employer]. This information is not intended to replace advice given to you by your health care provider. Make sure you discuss any questions you have with your health care provider. ° °

## 2014-07-17 NOTE — MAU Note (Signed)
Right sided abdominal pain that is constant, started this morning; describes as sharp & achy. had ectopic pregnancy in 2014 and states this feels like the same symptoms. Has had brown bleeding/discharge off and on x 2 weeks. Had negative home pregnancy test 1.5 wk ago. LMP 05/25/14. No birth control. Denies dysuria.

## 2014-07-18 LAB — POCT PREGNANCY, URINE: Preg Test, Ur: NEGATIVE

## 2015-02-27 ENCOUNTER — Emergency Department (HOSPITAL_BASED_OUTPATIENT_CLINIC_OR_DEPARTMENT_OTHER)
Admission: EM | Admit: 2015-02-27 | Discharge: 2015-02-27 | Disposition: A | Discharge status: Home / Self Care | Payer: 59 | Attending: Emergency Medicine | Admitting: Emergency Medicine

## 2015-02-27 ENCOUNTER — Encounter (HOSPITAL_BASED_OUTPATIENT_CLINIC_OR_DEPARTMENT_OTHER): Payer: Self-pay

## 2015-02-27 DIAGNOSIS — K602 Anal fissure, unspecified: Secondary | ICD-10-CM

## 2015-02-27 DIAGNOSIS — Z79899 Other long term (current) drug therapy: Secondary | ICD-10-CM | POA: Insufficient documentation

## 2015-02-27 DIAGNOSIS — Z8619 Personal history of other infectious and parasitic diseases: Secondary | ICD-10-CM | POA: Insufficient documentation

## 2015-02-27 DIAGNOSIS — Z87891 Personal history of nicotine dependence: Secondary | ICD-10-CM | POA: Insufficient documentation

## 2015-02-27 DIAGNOSIS — Z8639 Personal history of other endocrine, nutritional and metabolic disease: Secondary | ICD-10-CM | POA: Insufficient documentation

## 2015-02-27 DIAGNOSIS — F909 Attention-deficit hyperactivity disorder, unspecified type: Secondary | ICD-10-CM | POA: Insufficient documentation

## 2015-02-27 MED ORDER — HYDROCORTISONE ACETATE 25 MG RE SUPP: 25.0000 mg | Freq: Two times a day (BID) | RECTAL | Status: DC

## 2015-02-27 NOTE — ED Provider Notes (Signed)
CSN: [id number]     Arrival date & time 02/27/15  1258 History  By signing my name below, I, Meriel Pica, attest that this documentation has been prepared under the direction and in the presence of Delora Fuel, MD. Electronically Signed: Meriel Pica, ED Scribe. 02/27/2015. 3:35 PM.   Chief Complaint  Patient presents with  . Hemorrhoids   The history is provided by the patient. No language interpreter was used.   HPI Comments: Alexis Ellis is a 24 y.o. female, with no pertinent PMhx, who presents to the Emergency Department complaining of constant, moderate rectal pain that is only present with bowel movements and associated bright red blood that she notices with wiping X 4 days. She has not tried any alleviating medication or treatments for her symptoms. Pt reports she believes she has a hemorrhoid. Denies hematochezia, constipation or straining with bowel movements, abdominal pain, back pain, fevers or chills. Pt reports she is not followed by a PCP.   Past Medical History  Diagnosis Date  . ADD (attention deficit disorder)   . Frequent headaches   . Elevated blood pressure   . Edema of both legs   . Chicken pox   . Elevated cholesterol   . Ectopic pregnancy    Past Surgical History  Procedure Laterality Date  . Wisedom teeth    . Laparoscopy N/A 09/16/2012    Procedure: LAPAROSCOPY OPERATIVE;  Surgeon: Allena Katz, MD;  Location: [facility];  Service: Gynecology;  Laterality: N/A;  Evacuation Hemoperitoneum  . Unilateral salpingectomy Right 09/16/2012    Procedure: UNILATERAL SALPINGECTOMY;  Surgeon: Allena Katz, MD;  Location: [facility];  Service: Gynecology;  Laterality: Right;   Family History  Problem Relation Age of Onset  . Hypertension Mother     Living  . Diabetes Father 23    Deceased  . Heart attack Father   . Sudden death Father   . Hyperlipidemia Neg Hx   . Bipolar disorder Maternal Grandmother   . Diabetes Maternal Grandfather   . Alcohol abuse  Paternal Grandfather   . Liver disease Paternal Grandfather   . Fibromyalgia Mother   . Healthy Sister    Social History  Substance Use Topics  . Smoking status: Former Smoker -- 0.00 packs/day  . Smokeless tobacco: Never Used  . Alcohol Use: Yes   OB History    Gravida Para Term Preterm AB TAB SAB Ectopic Multiple Living   1    1   1        Review of Systems  Constitutional: Negative for fever and chills.  Gastrointestinal: Positive for anal bleeding ( bright red) and rectal pain. Negative for abdominal pain, constipation and blood in stool.  Musculoskeletal: Negative for back pain.  All other systems reviewed and are negative.  Allergies  Review of patient's allergies indicates no known allergies.  Home Medications   Prior to Admission medications   Medication Sig Start Date End Date Taking? Authorizing Provider  ClonazePAM (KLONOPIN PO) Take by mouth.   Yes Historical Provider, MD  amphetamine-dextroamphetamine (ADDERALL) 20 MG tablet Take 20 mg by mouth 4 (four) times daily.     Historical Provider, MD   BP 141/79 mmHg  Pulse 88  Temp(Src) 98.2 F (36.8 C) (Oral)  Resp 18  Ht 5\' 5"  (1.651 m)  Wt 220 lb (99.791 kg)  BMI 36.61 kg/m2  SpO2 98%  LMP 02/23/2015 Physical Exam  Constitutional: She is oriented to person, place, and time. She appears  well-developed and well-nourished. No distress.  HENT:  Head: Normocephalic and atraumatic.  Eyes: Conjunctivae and EOM are normal. Pupils are equal, round, and reactive to light.  Neck: Normal range of motion. Neck supple. No JVD present.  Cardiovascular: Normal rate, regular rhythm and normal heart sounds.   No murmur heard. Pulmonary/Chest: Effort normal and breath sounds normal. She has no wheezes. She has no rales. She exhibits no tenderness.  Abdominal: Soft. Bowel sounds are normal. She exhibits no distension and no mass. There is no tenderness.  Genitourinary: Guaiac negative stool.  Small hemorrhoid at 6 o' clock  position that is not thrombose, fissure also identified at 6 o' clock position, no internal hemorrhoids or masses, no gross blood.   Musculoskeletal: Normal range of motion. She exhibits no edema.  Lymphadenopathy:    She has no cervical adenopathy.  Neurological: She is alert and oriented to person, place, and time. No cranial nerve deficit. Coordination normal.  Skin: Skin is warm and dry. No rash noted.  Psychiatric: She has a normal mood and affect. Her behavior is normal. Judgment and thought content normal.  Nursing note and vitals reviewed.   ED Course  Procedures  DIAGNOSTIC STUDIES: Oxygen Saturation is 98% on RA, normal by my interpretation.    COORDINATION OF CARE: 3:33 PM Discussed treatment plan with pt at bedside and pt agreed to plan. Will prescribe Anusol-HC.   MDM   Final diagnoses:  Anal fissure    ) Blood per rectum with pain on defecating and examined showing a small fissure. No signs of other pathology present. She is discharged with prescription for Anusol HC suppository and is referred to gastroenterology if symptoms do not improve after one week of Anusol HC.  I, Declynn Lopresti, personally performed the services described in this documentation. All medical record entries made by the scribe were at my direction and in my presence.  I have reviewed the chart and discharge instructions and agree that the record reflects my personal performance and is accurate and complete. Baillie Mohammad.  16/02/9603. 11:39 PM.      Delora Fuel, MD 54/09/81 1914

## 2015-02-27 NOTE — ED Notes (Signed)
C/o hemorrhoid x 4 days

## 2015-02-27 NOTE — Discharge Instructions (Signed)
If symptoms do not clear up with one week of using the suppository, then make an appointment with the gastroenterologist.  Anal Fissure, Adult An anal fissure is a small tear or crack in the skin around the anus. Bleeding from a fissure usually stops on its own within a few minutes. However, bleeding will often occur again with each bowel movement until the crack heals. CAUSES This condition may be caused by:  Passing large, hard stool (feces).  Frequent diarrhea.  Constipation.  Inflammatory bowel disease (Crohn disease or ulcerative colitis).  Infections.  Anal sex. SYMPTOMS Symptoms of this condition include:  Bleeding from the rectum.  Small amounts of blood seen on your stool, on toilet paper, or in the toilet after a bowel movement.  Painful bowel movements.  Itching or irritation around the anus. DIAGNOSIS A health care provider may diagnose this condition by closely examining the anal area. An anal fissure can usually be seen with careful inspection. In some cases, a rectal exam may be performed, or a short tube (anoscope) may be used to examine the anal canal. TREATMENT Treatment for this condition may include:  Taking steps to avoid constipation. This may include making changes to your diet, such as increasing your intake of fiber or fluid.  Taking fiber supplements. These supplements can soften your stool to help make bowel movements easier. Your health care provider may also prescribe a stool softener if your stool is often hard.  Taking sitz baths. This may help to heal the tear.  Using medicated creams or ointments. These may be prescribed to lessen discomfort. HOME CARE INSTRUCTIONS Eating and Drinking  Avoid foods that may be constipating, such as bananas and dairy products.  Drink enough fluid to keep your urine clear or pale yellow.  Maintain a diet that is high in fruits, whole grains, and vegetables. General Instructions  Keep the anal area as  clean and dry as possible.  Take sitz baths as told by your health care provider. Do not use soap in the sitz baths.  Take over-the-counter and prescription medicines only as told by your health care provider.  Use creams or ointments only as told by your health care provider.  Keep all follow-up visits as told by your health care provider. This is important. SEEK MEDICAL CARE IF:  You have more bleeding.  You have a fever.  You have diarrhea that is mixed with blood.  You continue to have pain.  Your problem is getting worse rather than better.   This information is not intended to replace advice given to you by your health care provider. Make sure you discuss any questions you have with your health care provider.   Document Released: 04/29/2005 Document Revised: 01/18/2015 Document Reviewed: 07/25/2014 Elsevier Interactive Patient Education 2016 [employer] Inc.  Hydrocortisone suppositories What is this medicine? HYDROCORTISONE (hye droe KOR ti sone) is a corticosteroid. It is used to decrease swelling, itching, and pain that is caused by minor skin irritations or by hemorrhoids. This medicine may be used for other purposes; ask your health care provider or pharmacist if you have questions. What should I tell my health care provider before I take this medicine? They need to know if you have any of these conditions: -an unusual or allergic reaction to hydrocortisone, corticosteroids, other medicines, foods, dyes, or preservatives -pregnant or trying to get pregnant -breast-feeding How should I use this medicine? This medicine is for rectal use only. Do not take by mouth. Wash your hands before  and after use. Take off the foil wrapping. Wet the tip of the suppository with cold tap water to make it easier to use. Lie on your side with your lower leg straightened out and your upper leg bent forward toward your stomach. Lift upper buttock to expose the rectal area. Apply gentle  pressure to insert the suppository completely into the rectum, pointed end first. Hold buttocks together for a few seconds. Remain lying down for about 15 minutes to avoid having the suppository come out. Do not use more often than directed. Talk to your pediatrician regarding the use of this medicine in children. Special care may be needed. Overdosage: If you think you have taken too much of this medicine contact a poison control center or emergency room at once. NOTE: This medicine is only for you. Do not share this medicine with others. What if I miss a dose? If you miss a dose, use it as soon as you can. If it is almost time for your next dose, use only that dose. Do not use double or extra doses. What may interact with this medicine? Interactions are not expected. Do not use any other rectal products on the affected area without telling your doctor or health care professional. This list may not describe all possible interactions. Give your health care provider a list of all the medicines, herbs, non-prescription drugs, or dietary supplements you use. Also tell them if you smoke, drink alcohol, or use illegal drugs. Some items may interact with your medicine. What should I watch for while using this medicine? Visit your doctor or health care professional for regular checks on your progress. Tell your doctor or health care professional if your symptoms do not improve after a few days of use. Do not use if there is blood in your stools. If you get any type of infection while using this medicine, you may need to stop using this medicine until our infections clears up. Ask your doctor or health care professional for advice. What side effects may I notice from receiving this medicine? Side effects that you should report to your doctor or health care professional as soon as possible: -bloody or black, tarry stools -painful, red, pus filled blisters in hair follicles -rectal pain, burning or bleeding  after use of medicine Side effects that usually do not require medical attention (report to your doctor or health care professional if they continue or are bothersome): -changes in skin color -dry skin -itching or irritation This list may not describe all possible side effects. Call your doctor for medical advice about side effects. You may report side effects to FDA at 1-800-FDA-1088. Where should I keep my medicine? Keep out of the reach of children. Store at room temperature between 20 and 25 degrees C (68 and 77 degrees F). Protect from heat and freezing. Throw away any unused medicine after the expiration date. NOTE: This sheet is a summary. It may not cover all possible information. If you have questions about this medicine, talk to your doctor, pharmacist, or health care provider.    2016, Elsevier/Gold Standard. (2007-09-11 16:07:24)

## 2015-03-29 ENCOUNTER — Ambulatory Visit (INDEPENDENT_AMBULATORY_CARE_PROVIDER_SITE_OTHER): Payer: BLUE CROSS/BLUE SHIELD | Admitting: Family Medicine

## 2015-03-29 VITALS — BP 130/80 | HR 75 | Temp 98.3°F | Resp 16 | Ht 67.0 in | Wt 220.0 lb

## 2015-03-29 DIAGNOSIS — R197 Diarrhea, unspecified: Secondary | ICD-10-CM | POA: Diagnosis not present

## 2015-03-29 NOTE — Patient Instructions (Addendum)
Imodium as directed tonight   Diarrhea Diarrhea is frequent loose and watery bowel movements. It can cause you to feel weak and dehydrated. Dehydration can cause you to become tired and thirsty, have a dry mouth, and have decreased urination that often is dark yellow. Diarrhea is a sign of another problem, most often an infection that will not last long. In most cases, diarrhea typically lasts 2-3 days. However, it can last longer if it is a sign of something more serious. It is important to treat your diarrhea as directed by your caregiver to lessen or prevent future episodes of diarrhea. CAUSES  Some common causes include:  Gastrointestinal infections caused by viruses, bacteria, or parasites.  Food poisoning or food allergies.  Certain medicines, such as antibiotics, chemotherapy, and laxatives.  Artificial sweeteners and fructose.  Digestive disorders. HOME CARE INSTRUCTIONS  Ensure adequate fluid intake (hydration): Have 1 cup (8 oz) of fluid for each diarrhea episode. Avoid fluids that contain simple sugars or sports drinks, fruit juices, whole milk products, and sodas. Your urine should be clear or pale yellow if you are drinking enough fluids. Hydrate with an oral rehydration solution that you can purchase at pharmacies, retail stores, and online. You can prepare an oral rehydration solution at home by mixing the following ingredients together:   - tsp table salt.   tsp baking soda.   tsp salt substitute containing potassium chloride.  1  tablespoons sugar.  1 L (34 oz) of water.  Certain foods and beverages may increase the speed at which food moves through the gastrointestinal (GI) tract. These foods and beverages should be avoided and include:  Caffeinated and alcoholic beverages.  High-fiber foods, such as raw fruits and vegetables, nuts, seeds, and whole grain breads and cereals.  Foods and beverages sweetened with sugar alcohols, such as xylitol, sorbitol, and  mannitol.  Some foods may be well tolerated and may help thicken stool including:  Starchy foods, such as rice, toast, pasta, low-sugar cereal, oatmeal, grits, baked potatoes, crackers, and bagels.  Bananas.  Applesauce.  Add probiotic-rich foods to help increase healthy bacteria in the GI tract, such as yogurt and fermented milk products.  Wash your hands well after each diarrhea episode.  Only take over-the-counter or prescription medicines as directed by your caregiver.  Take a warm bath to relieve any burning or pain from frequent diarrhea episodes. SEEK IMMEDIATE MEDICAL CARE IF:   You are unable to keep fluids down.  You have persistent vomiting.  You have blood in your stool, or your stools are black and tarry.  You do not urinate in 6-8 hours, or there is only a small amount of very dark urine.  You have abdominal pain that increases or localizes.  You have weakness, dizziness, confusion, or light-headedness.  You have a severe headache.  Your diarrhea gets worse or does not get better.  You have a fever or persistent symptoms for more than 2-3 days.  You have a fever and your symptoms suddenly get worse. MAKE SURE YOU:   Understand these instructions.  Will watch your condition.  Will get help right away if you are not doing well or get worse.   This information is not intended to replace advice given to you by your health care provider. Make sure you discuss any questions you have with your health care provider.   Document Released: 04/19/2002 Document Revised: 05/20/2014 Document Reviewed: 01/05/2012 Elsevier Interactive Patient Education [employer].

## 2015-03-29 NOTE — Progress Notes (Signed)
@[facility] @  This chart was scribed for Robyn Haber, MD by Thea Alken, ED Scribe. This patient was seen in room 13 and the patient's care was started at 6:34 PM.  Patient ID: Alexis Ellis MRN: [id number], DOB: 1990-08-26, 24 y.o. Date of Encounter: 03/29/2015, 6:34 PM MRN: [id number], DOB: 1990-08-26, 24 y.o. Date of Encounter: 03/29/2015, 6:34 PM  Primary Physician: Leeanne Rio, PA-C  Chief Complaint:   Chief Complaint  Patient presents with   Diarrhea    x 1 day    HPI: 24 y.o. year old female with history below presents with worsening diarrhea that began yesterday. She denies blood in stool but has noticed a little bit on the toilet paper. She has not tried OTC medication but her boyfriend has imodium at home .  She denies fever, chills and abdominal pain. She has hx of ectopic pregnancy and has had abdominal surgery.  Pt is a IT trainer at [employer],   Past Medical History  Diagnosis Date   ADD (attention deficit disorder)    Frequent headaches    Elevated blood pressure    Edema of both legs    Chicken pox    Elevated cholesterol    Ectopic pregnancy    Depression      Home Meds: Prior to Admission medications   Medication Sig Start Date End Date Taking? Authorizing Provider  amphetamine-dextroamphetamine (ADDERALL) 20 MG tablet Take 20 mg by mouth 4 (four) times daily.    Yes Historical Provider, MD  ClonazePAM (KLONOPIN PO) Take by mouth.   Yes Historical Provider, MD  hydrocortisone (ANUSOL-HC) 25 MG suppository Place 1 suppository (25 mg total) rectally 2 (two) times daily. For 7 days Patient not taking: Reported on 03/29/2015 [id number]   Delora Fuel, MD    Allergies: No Known Allergies  Social History   Social History   Marital Status: Single    Spouse Name: N/A   Number of Children: N/A   Years of Education: N/A   Occupational History   Not on file.   Social History Main Topics   Smoking status: Former Smoker -- 0.00 packs/day   Smokeless tobacco: Never Used   Alcohol Use: Yes    Drug Use: No   Sexual Activity: Yes    Museum/gallery curator: None   Other Topics Concern   Not on file   Social History Narrative     Review of Systems: Constitutional: negative for chills, fever, night sweats, weight changes, or fatigue  HEENT: negative for vision changes, hearing loss, congestion, rhinorrhea, ST, epistaxis, or sinus pressure Cardiovascular: negative for chest pain or palpitations Respiratory: negative for hemoptysis, wheezing, shortness of breath, or cough Abdominal: negative for abdominal pain, nausea, vomiting, or constipation Dermatological: negative for rash Neurologic: negative for headache, dizziness, or syncope All other systems reviewed and are otherwise negative with the exception to those above and in the HPI.   Physical Exam: Blood pressure 130/80, pulse 75, temperature 98.3 F (36.8 C), temperature source Oral, resp. rate 16, height 5\' 7"  (1.702 m), weight 220 lb (99.791 kg), last menstrual period 03/22/2015, SpO2 97 %., Body mass index is 34.45 kg/(m^2). General: Well developed, well nourished, in no acute distress. Head: Normocephalic, atraumatic, eyes without discharge, sclera non-icteric, nares are without discharge. Bilateral auditory canals clear, TM's are without perforation, pearly grey and translucent with reflective cone of light bilaterally. Oral cavity moist, posterior pharynx without exudate, erythema, peritonsillar abscess, or post nasal drip.  Neck: Supple. No thyromegaly. Full ROM. No lymphadenopathy. Lungs: Clear bilaterally to auscultation without wheezes, rales, or rhonchi.  Breathing is unlabored. Heart: RRR with S1 S2. No murmurs, rubs, or gallops appreciated. Abdomen: Soft, non-tender, non-distended with normoactive bowel sounds. No hepatomegaly. No rebound/guarding. No obvious abdominal masses. Msk:  Strength and tone normal for age. Extremities/Skin: Warm and dry. No clubbing or cyanosis. No edema. No rashes or suspicious  lesions. Neuro: Alert and oriented X 3. Moves all extremities spontaneously. Gait is normal. CNII-XII grossly in tact. Psych:  Responds to questions appropriately with a normal affect.    ASSESSMENT AND PLAN:  24 y.o. year old female with This chart was scribed in my presence and reviewed by me personally. with This chart was scribed in my presence and reviewed by me personally.    ICD-9-CM ICD-10-CM   1. Diarrhea, unspecified type 787.91 R19.7    Trial of imodium tonight.  OOW x 2 days  Signed, Robyn Haber, MD    By signing my name below, I, Raven Small, attest that this documentation has been prepared under the direction and in the presence of Robyn Haber, MD.  Electronically Signed: Thea Alken, ED Scribe. 03/29/2015. 6:34 PM.  Signed, Robyn Haber, MD 03/29/2015 6:34 PM

## 2015-05-19 ENCOUNTER — Ambulatory Visit: Payer: 59 | Admitting: Gastroenterology

## 2015-06-05 ENCOUNTER — Ambulatory Visit (INDEPENDENT_AMBULATORY_CARE_PROVIDER_SITE_OTHER): Payer: BLUE CROSS/BLUE SHIELD

## 2015-06-05 ENCOUNTER — Ambulatory Visit (INDEPENDENT_AMBULATORY_CARE_PROVIDER_SITE_OTHER): Payer: BLUE CROSS/BLUE SHIELD | Admitting: Family Medicine

## 2015-06-05 VITALS — BP 126/80 | HR 78 | Temp 98.0°F | Resp 20 | Ht 66.0 in | Wt 215.6 lb

## 2015-06-05 DIAGNOSIS — R42 Dizziness and giddiness: Secondary | ICD-10-CM

## 2015-06-05 DIAGNOSIS — R112 Nausea with vomiting, unspecified: Secondary | ICD-10-CM | POA: Diagnosis not present

## 2015-06-05 DIAGNOSIS — K529 Noninfective gastroenteritis and colitis, unspecified: Secondary | ICD-10-CM

## 2015-06-05 DIAGNOSIS — A09 Infectious gastroenteritis and colitis, unspecified: Secondary | ICD-10-CM

## 2015-06-05 DIAGNOSIS — R197 Diarrhea, unspecified: Secondary | ICD-10-CM | POA: Diagnosis not present

## 2015-06-05 LAB — POCT CBC
Granulocyte percent: 54.1 %G (ref 37–80)
HCT, POC: 45.5 % (ref 37.7–47.9)
Hemoglobin: 15.8 g/dL (ref 12.2–16.2)
Lymph, poc: 4.8 — AB (ref 0.6–3.4)
MCH, POC: 29.7 pg (ref 27–31.2)
MCHC: 34.8 g/dL (ref 31.8–35.4)
MCV: 85.4 fL (ref 80–97)
MID (cbc): 0.4 (ref 0–0.9)
MPV: 7.6 fL (ref 0–99.8)
POC Granulocyte: 6.1 (ref 2–6.9)
POC LYMPH PERCENT: 42.4 %L (ref 10–50)
POC MID %: 3.5 %M (ref 0–12)
Platelet Count, POC: 326 10*3/uL (ref 142–424)
RBC: 5.33 M/uL (ref 4.04–5.48)
RDW, POC: 12.5 %
WBC: 11.3 10*3/uL — AB (ref 4.6–10.2)

## 2015-06-05 LAB — POCT URINALYSIS DIP (MANUAL ENTRY)
Bilirubin, UA: NEGATIVE
Blood, UA: NEGATIVE
Glucose, UA: NEGATIVE
Ketones, POC UA: NEGATIVE
Leukocytes, UA: NEGATIVE
Nitrite, UA: NEGATIVE
Protein Ur, POC: NEGATIVE
Spec Grav, UA: 1.025
Urobilinogen, UA: 0.2
pH, UA: 5.5

## 2015-06-05 LAB — POC MICROSCOPIC URINALYSIS (UMFC)

## 2015-06-05 LAB — POCT URINE PREGNANCY: Preg Test, Ur: NEGATIVE

## 2015-06-05 MED ORDER — ONDANSETRON 4 MG PO TBDP: 4.0000 mg | ORAL_TABLET | Freq: Once | ORAL | Status: DC

## 2015-06-05 MED ORDER — ONDANSETRON 4 MG PO TBDP
8.0000 mg | ORAL_TABLET | Freq: Once | ORAL | Status: AC
  Administered 2015-06-05: 8 mg via ORAL

## 2015-06-05 MED ORDER — ONDANSETRON 8 MG PO TBDP: 8.0000 mg | ORAL_TABLET | Freq: Three times a day (TID) | ORAL | Status: DC | PRN

## 2015-06-05 NOTE — Patient Instructions (Addendum)
Viral Gastroenteritis Viral gastroenteritis is also known as stomach flu. This condition affects the stomach and intestinal tract. It can cause sudden diarrhea and vomiting. The illness typically lasts 3 to 8 days. Most people develop an immune response that eventually gets rid of the virus. While this natural response develops, the virus can make you quite ill. CAUSES  Many different viruses can cause gastroenteritis, such as rotavirus or noroviruses. You can catch one of these viruses by consuming contaminated food or water. You may also catch a virus by sharing utensils or other personal items with an infected person or by touching a contaminated surface. SYMPTOMS  The most common symptoms are diarrhea and vomiting. These problems can cause a severe loss of body fluids (dehydration) and a body salt (electrolyte) imbalance. Other symptoms may include:  Fever.  Headache.  Fatigue.  Abdominal pain. DIAGNOSIS  Your caregiver can usually diagnose viral gastroenteritis based on your symptoms and a physical exam. A stool sample may also be taken to test for the presence of viruses or other infections. TREATMENT  This illness typically goes away on its own. Treatments are aimed at rehydration. The most serious cases of viral gastroenteritis involve vomiting so severely that you are not able to keep fluids down. In these cases, fluids must be given through an intravenous line (IV). HOME CARE INSTRUCTIONS   Drink enough fluids to keep your urine clear or pale yellow. Drink small amounts of fluids frequently and increase the amounts as tolerated.  Ask your caregiver for specific rehydration instructions.  Avoid:  Foods high in sugar.  Alcohol.  Carbonated drinks.  Tobacco.  Juice.  Caffeine drinks.  Extremely hot or cold fluids.  Fatty, greasy foods.  Too much intake of anything at one time.  Dairy products until 24 to 48 hours after diarrhea stops.  You may consume probiotics.  Probiotics are active cultures of beneficial bacteria. They may lessen the amount and number of diarrheal stools in adults. Probiotics can be found in yogurt with active cultures and in supplements.  Wash your hands well to avoid spreading the virus.  Only take over-the-counter or prescription medicines for pain, discomfort, or fever as directed by your caregiver. Do not give aspirin to children. Antidiarrheal medicines are not recommended.  Ask your caregiver if you should continue to take your regular prescribed and over-the-counter medicines.  Keep all follow-up appointments as directed by your caregiver. SEEK IMMEDIATE MEDICAL CARE IF:   You are unable to keep fluids down.  You do not urinate at least once every 6 to 8 hours.  You develop shortness of breath.  You notice blood in your stool or vomit. This may look like coffee grounds.  You have abdominal pain that increases or is concentrated in one small area (localized).  You have persistent vomiting or diarrhea.  You have a fever.  The patient is a child younger than 3 months, and he or she has a fever.  The patient is a child older than 3 months, and he or she has a fever and persistent symptoms.  The patient is a child older than 3 months, and he or she has a fever and symptoms suddenly get worse.  The patient is a baby, and he or she has no tears when crying. MAKE SURE YOU:   Understand these instructions.  Will watch your condition.  Will get help right away if you are not doing well or get worse.   This information is not intended to replace  advice given to you by your health care provider. Make sure you discuss any questions you have with your health care provider.   Document Released: 04/29/2005 Document Revised: 07/22/2011 Document Reviewed: 02/13/2011 Elsevier Interactive Patient Education 2016 Elsevier Inc[employer] Dehydration, Adult Dehydration is a condition in which you do not have enough fluid or water in  your body. It happens when you take in less fluid than you lose. Vital organs such as the kidneys, brain, and heart cannot function without a proper amount of fluids. Any loss of fluids from the body can cause dehydration.  Dehydration can range from mild to severe. This condition should be treated right away to help prevent it from becoming severe. CAUSES  This condition may be caused by: Vomiting. Diarrhea. Excessive sweating, such as when exercising in hot or humid weather. Not drinking enough fluid during strenuous exercise or during an illness. Excessive urine output. Fever. Certain medicines. RISK FACTORS This condition is more likely to develop in: People who are taking certain medicines that cause the body to lose excess fluid (diuretics).  People who have a chronic illness, such as diabetes, that may increase urination. Older adults.  People who live at high altitudes.  People who participate in endurance sports.  SYMPTOMS  Mild Dehydration Thirst. Dry lips. Slightly dry mouth. Dry, warm skin. Moderate Dehydration Very dry mouth.  Muscle cramps.  Dark urine and decreased urine production.  Decreased tear production.  Headache.  Light-headedness, especially when you stand up from a sitting position.  Severe Dehydration Changes in skin.  Cold and clammy skin.  Skin does not spring back quickly when lightly pinched and released.  Changes in body fluids.  Extreme thirst.  No tears.  Not able to sweat when body temperature is high, such as in hot weather.  Minimal urine production.  Changes in vital signs.  Rapid, weak pulse (more than 100 beats per minute when you are sitting still).  Rapid breathing.  Low blood pressure.  Other changes.  Sunken eyes.  Cold hands and feet.  Confusion. Lethargy and difficulty being awakened. Fainting (syncope).  Short-term weight loss.  Unconsciousness. DIAGNOSIS  This condition may be diagnosed  based on your symptoms. You may also have tests to determine how severe your dehydration is. These tests may include:  Urine tests.  Blood tests.  TREATMENT  Treatment for this condition depends on the severity. Mild or moderate dehydration can often be treated at home. Treatment should be started right away. Do not wait until dehydration becomes severe. Severe dehydration needs to be treated at the hospital. Treatment for Mild Dehydration Drinking plenty of water to replace the fluid you have lost.  Replacing minerals in your blood (electrolytes) that you may have lost.  Treatment for Moderate Dehydration Consuming oral rehydration solution (ORS). Treatment for Severe Dehydration Receiving fluid through an IV tube.  Receiving electrolyte solution through a feeding tube that is passed through your nose and into your stomach (nasogastric tube or NG tube). Correcting any abnormalities in electrolytes. HOME CARE INSTRUCTIONS  Drink enough fluid to keep your urine clear or pale yellow.  Drink water or fluid slowly by taking small sips. You can also try sucking on ice cubes. Have food or beverages that contain electrolytes. Examples include bananas and sports drinks. Take over-the-counter and prescription medicines only as told by your health care provider.  Prepare ORS according to the manufacturer's instructions. Take sips of ORS every 5 minutes until your urine returns to normal. If you have  vomiting or diarrhea, continue to try to drink water, ORS, or both.  If you have diarrhea, avoid:  Beverages that contain caffeine.  Fruit juice.  Milk.  Carbonated soft drinks. Do not take salt tablets. This can lead to the condition of having too much sodium in your body (hypernatremia).  SEEK MEDICAL CARE IF: You cannot eat or drink without vomiting. You have had moderate diarrhea during a period of more than 24 hours. You have a fever. SEEK IMMEDIATE MEDICAL CARE IF:  You have  extreme thirst. You have severe diarrhea. You have not urinated in 6-8 hours, or you have urinated only a small amount of very dark urine. You have shriveled skin. You are dizzy, confused, or both.   This information is not intended to replace advice given to you by your health care provider. Make sure you discuss any questions you have with your health care provider.   Document Released: 04/29/2005 Document Revised: 01/18/2015 Document Reviewed: 09/14/2014 Elsevier Interactive Patient Education 2016 [place] Choices to Help Relieve Diarrhea, Adult When you have diarrhea, the foods you eat and your eating habits are very important. Choosing the right foods and drinks can help relieve diarrhea. Also, because diarrhea can last up to 7 days, you need to replace lost fluids and electrolytes (such as sodium, potassium, and chloride) in order to help prevent dehydration.  WHAT GENERAL GUIDELINES DO I NEED TO FOLLOW?  Slowly drink 1 cup (8 oz) of fluid for each episode of diarrhea. If you are getting enough fluid, your urine will be clear or pale yellow.  Eat starchy foods. Some good choices include white rice, white toast, pasta, low-fiber cereal, baked potatoes (without the skin), saltine crackers, and bagels.  Avoid large servings of any cooked vegetables.  Limit fruit to two servings per day. A serving is  cup or 1 small piece.  Choose foods with less than 2 g of fiber per serving.  Limit fats to less than 8 tsp (38 g) per day.  Avoid fried foods.  Eat foods that have probiotics in them. Probiotics can be found in certain dairy products.  Avoid foods and beverages that may increase the speed at which food moves through the stomach and intestines (gastrointestinal tract). Things to avoid include:  High-fiber foods, such as dried fruit, raw fruits and vegetables, nuts, seeds, and whole grain foods.  Spicy foods and high-fat foods.  Foods and beverages sweetened with  high-fructose corn syrup, honey, or sugar alcohols such as xylitol, sorbitol, and mannitol. WHAT FOODS ARE RECOMMENDED? Grains White rice. White, [place], or pita breads (fresh or toasted), including plain rolls, buns, or bagels. White pasta. Saltine, soda, or graham crackers. Pretzels. Low-fiber cereal. Cooked cereals made with water (such as cornmeal, farina, or cream cereals). Plain muffins. Matzo. Melba toast. Zwieback.  Vegetables Potatoes (without the skin). Strained tomato and vegetable juices. Most well-cooked and canned vegetables without seeds. Tender lettuce. Fruits Cooked or canned applesauce, apricots, cherries, fruit cocktail, grapefruit, peaches, pears, or plums. Fresh bananas, apples without skin, cherries, grapes, cantaloupe, grapefruit, peaches, oranges, or plums.  Meat and Other Protein Products Baked or boiled chicken. Eggs. Tofu. Fish. Seafood. Smooth peanut butter. Ground or well-cooked tender beef, ham, veal, lamb, pork, or poultry.  Dairy Plain yogurt, kefir, and unsweetened liquid yogurt. Lactose-free milk, buttermilk, or soy milk. Plain hard cheese. Beverages Sport drinks. Clear broths. Diluted fruit juices (except prune). Regular, caffeine-free sodas such as ginger ale. Water. Decaffeinated teas. Oral rehydration solutions. Sugar-free beverages  not sweetened with sugar alcohols. Other Bouillon, broth, or soups made from recommended foods.  The items listed above may not be a complete list of recommended foods or beverages. Contact your dietitian for more options. WHAT FOODS ARE NOT RECOMMENDED? Grains Whole grain, whole wheat, bran, or rye breads, rolls, pastas, crackers, and cereals. Wild or brown rice. Cereals that contain more than 2 g of fiber per serving. Corn tortillas or taco shells. Cooked or dry oatmeal. Granola. Popcorn. Vegetables Raw vegetables. Cabbage, broccoli, Brussels sprouts, artichokes, baked beans, beet greens, corn, kale, legumes, peas, sweet  potatoes, and yams. Potato skins. Cooked spinach and cabbage. Fruits Dried fruit, including raisins and dates. Raw fruits. Stewed or dried prunes. Fresh apples with skin, apricots, mangoes, pears, raspberries, and strawberries.  Meat and Other Protein Products Chunky peanut butter. Nuts and seeds. Beans and lentils. Berniece Salines.  Dairy High-fat cheeses. Milk, chocolate milk, and beverages made with milk, such as milk shakes. Cream. Ice cream. Sweets and Desserts Sweet rolls, doughnuts, and sweet breads. Pancakes and waffles. Fats and Oils Butter. Cream sauces. Margarine. Salad oils. Plain salad dressings. Olives. Avocados.  Beverages Caffeinated beverages (such as coffee, tea, soda, or energy drinks). Alcoholic beverages. Fruit juices with pulp. Prune juice. Soft drinks sweetened with high-fructose corn syrup or sugar alcohols. Other Coconut. Hot sauce. Chili powder. Mayonnaise. Gravy. Cream-based or milk-based soups.  The items listed above may not be a complete list of foods and beverages to avoid. Contact your dietitian for more information. WHAT SHOULD I DO IF I BECOME DEHYDRATED? Diarrhea can sometimes lead to dehydration. Signs of dehydration include dark urine and dry mouth and skin. If you think you are dehydrated, you should rehydrate with an oral rehydration solution. These solutions can be purchased at pharmacies, retail stores, or online.  Drink -1 cup (120-240 mL) of oral rehydration solution each time you have an episode of diarrhea. If drinking this amount makes your diarrhea worse, try drinking smaller amounts more often. For example, drink 1-3 tsp (5-15 mL) every 5-10 minutes.  A general rule for staying hydrated is to drink 1-2 L of fluid per day. Talk to your health care provider about the specific amount you should be drinking each day. Drink enough fluids to keep your urine clear or pale yellow.   This information is not intended to replace advice given to you by your health  care provider. Make sure you discuss any questions you have with your health care provider.   Document Released: 07/20/2003 Document Revised: 05/20/2014 Document Reviewed: 03/22/2013 Elsevier Interactive Patient Education [employer].  Because you received an x-ray today, you will receive an invoice from [facility]. Please contact [facility] at [phone] with questions or concerns regarding your invoice. Our billing staff will not be able to assist you with those questions.

## 2015-06-05 NOTE — Progress Notes (Addendum)
Subjective:    Patient ID: Alexis Ellis, female    DOB: 1991/02/15, 25 y.o.   MRN: [id number] By signing my name below, I, Judithe Modest, attest that this documentation has been prepared under the direction and in the presence of Delman Cheadle, MD. Electronically Signed: Judithe Modest, ER Scribe. 06/05/2015. 10:00 PM.  Chief Complaint  Patient presents with  . Diarrhea    x 2 days    HPI HPI Comments: Alexis Ellis is a 25 y.o. female with a past hx of ectopic pregnancy who presents to [facility] complaining of diarrhea and vomiting for the last 48 hours. She has also had dizziness when she stands up quickly for the last week. She denies cough congestion, dysuria, fever, or chills. She works at a [employer], [employer]. She has taken pepto bismol without relief. She is still throwing up though she hasn't eaten anything, and at this point she is throwing up bile. She has been able to keep down water and gingerale. There are no coffee grounds in her vomit. Her diarrhea is somewhat dark. She ate out at a steak house three days ago, though no one else she went to dinner with has gotten sick. She denies flank pain. She slept last night, but the two preceding nights she did not sleep at all because she had to get up to vomit and have diarrhea very frequently. Her periods have been normal. There is no chance she is pregnant. She normally has very regular BM, but two days before her sx began she was constipated. She has no past hx of GERD. She has taken two ibuprofen daily for a long time due to daily mild HA. She does not drink much alcohol. She takes adderrol daily. She takes kolonopin rarely as needed. She has a family hx of DM (father).   She had recent blood counts which were slightly abnormal, so they were followed with lupus screening tests.   Past Medical History  Diagnosis Date  . ADD (attention deficit disorder)   . Frequent headaches   . Elevated blood pressure   . Edema of both legs   .  Chicken pox   . Elevated cholesterol   . Ectopic pregnancy   . Depression    No Known Allergies  Current Outpatient Prescriptions on File Prior to Visit  Medication Sig Dispense Refill  . amphetamine-dextroamphetamine (ADDERALL) 20 MG tablet Take 20 mg by mouth 4 (four) times daily.     . ClonazePAM (KLONOPIN PO) Take by mouth.    . hydrocortisone (ANUSOL-HC) 25 MG suppository Place 1 suppository (25 mg total) rectally 2 (two) times daily. For 7 days (Patient not taking: Reported on 03/29/2015) 14 suppository 0   No current facility-administered medications on file prior to visit.   Review of Systems  Constitutional: Positive for activity change, appetite change and fatigue. Negative for fever, chills and unexpected weight change.  Gastrointestinal: Positive for nausea, vomiting, abdominal pain, diarrhea, constipation and anal bleeding. Negative for blood in stool and abdominal distention.  Genitourinary: Negative for dysuria, hematuria, flank pain, decreased urine volume, vaginal discharge, enuresis, difficulty urinating, genital sores, menstrual problem and pelvic pain.  Allergic/Immunologic: Negative for immunocompromised state.  Neurological: Positive for light-headedness and headaches. Negative for dizziness and weakness.  Psychiatric/Behavioral: Positive for sleep disturbance and dysphoric mood. Negative for confusion. The patient is nervous/anxious.       Objective:  BP 126/80 mmHg  Pulse 78  Temp(Src) 98 F (36.7 C) (Oral)  Resp  20  Ht 5\' 6"  (1.676 m)  Wt 215 lb 9.6 oz (97.796 kg)  BMI 34.82 kg/m2  SpO2 98%  LMP 05/18/2015  Physical Exam  Constitutional: She is oriented to person, place, and time. She appears well-developed and well-nourished. No distress.  HENT:  Head: Normocephalic and atraumatic.  Mouth/Throat: Oropharynx is clear and moist.  Eyes: Pupils are equal, round, and reactive to light.  Neck: Neck supple.  Cardiovascular: Regular rhythm.   No murmur  heard. Tachycardic with normal rhythm.   Pulmonary/Chest: Effort normal. No respiratory distress.  Abdominal: Soft. She exhibits no mass. There is no tenderness. There is no rebound and no guarding.  No CVA tenderness. Hypoactive bowel sounds. Mild generalized abdominal tenderness. No rebound, guarding, or mass.   Musculoskeletal: Normal range of motion.  Neurological: She is alert and oriented to person, place, and time. Coordination normal.  Skin: Skin is warm and dry. She is not diaphoretic.  Psychiatric: She has a normal mood and affect. Her behavior is normal.  Nursing note and vitals reviewed.     Results for orders placed or performed in visit on 06/05/15  POCT CBC  Result Value Ref Range   WBC 11.3 (A) 4.6 - 10.2 K/uL   Lymph, poc 4.8 (A) 0.6 - 3.4   POC LYMPH PERCENT 42.4 10 - 50 %L   MID (cbc) 0.4 0 - 0.9   POC MID % 3.5 0 - 12 %M   POC Granulocyte 6.1 2 - 6.9   Granulocyte percent 54.1 37 - 80 %G   RBC 5.33 4.04 - 5.48 M/uL   Hemoglobin 15.8 12.2 - 16.2 g/dL   HCT, POC 45.5 37.7 - 47.9 %   MCV 85.4 80 - 97 fL   MCH, POC 29.7 27 - 31.2 pg   MCHC 34.8 31.8 - 35.4 g/dL   RDW, POC 12.5 %   Platelet Count, POC 326 142 - 424 K/uL   MPV 7.6 0 - 99.8 fL  POCT urinalysis dipstick  Result Value Ref Range   Color, UA yellow yellow   Clarity, UA cloudy (A) clear   Glucose, UA negative negative   Bilirubin, UA negative negative   Ketones, POC UA negative negative   Spec Grav, UA 1.025    Blood, UA negative negative   pH, UA 5.5    Protein Ur, POC negative negative   Urobilinogen, UA 0.2    Nitrite, UA Negative Negative   Leukocytes, UA Negative Negative  POCT Microscopic Urinalysis ([facility])  Result Value Ref Range   WBC,UR,HPF,POC Few (A) None WBC/hpf   RBC,UR,HPF,POC None None RBC/hpf   Bacteria Few (A) None, Too numerous to count   Mucus Present (A) Absent   Epithelial Cells, UR Per Microscopy Moderate (A) None, Too numerous to count cells/hpf  POCT urine pregnancy    Result Value Ref Range   Preg Test, Ur Negative Negative   Dg Abd Acute W/chest  06/05/2015  CLINICAL DATA:  Vomiting and diarrhea of 48 hours. EXAM: DG ABDOMEN ACUTE W/ 1V CHEST COMPARISON:  None. FINDINGS: There is no evidence of dilated bowel loops or free intraperitoneal air. No radiopaque calculi or other significant radiographic abnormality is seen. Heart size and mediastinal contours are within normal limits. Both lungs are clear. IMPRESSION: Negative abdominal radiographs.  No acute cardiopulmonary disease. Electronically Signed   By: Fidela Salisbury M.D.   On: 06/05/2015 22:41    Assessment & Plan:   1. Gastroenteritis presumed infectious   2. Intractable vomiting  with nausea, unspecified vomiting type   3. Diarrhea, unspecified type   4. Dizziness and giddiness   Suspect viral etiology - exam reassuring.  Pt w/ mild dehydration worsening sxs so keep zofran on board and push fluids. Recheck in 1-2d if sxs persist/worsen.  No antidiarrheals. Suspect pt has some underlying IBS-D sxs which I encouraged pt to discuss with her psych Dr. Toy Care. Would explain why her GI sxs usu resolve with a little klonopin. It is interesting that her gynecologist reported a positive screening test for lupus.  Might be worth getting these results and consider fecal lactoferrin if GI sxs cont. Deferred pelvic/rectal exam today since pt had normal exam 1 wk prior. Pt trying to conceive and has lost 3 pregnancies - her gyn is trying to figure out if she is going to be able to carry a pregnancy to term at any point.  Under a ton of stress from this as well as work high stress and fiance is incarcerated. All of which would explain worsening GI sxs if pt has IBS. Rec starting pnv and probiotics  Orders Placed This Encounter  Procedures  . DG Abd Acute W/Chest    Standing Status: Future     Number of Occurrences: 1     Standing Expiration Date: 06/04/2016    Order Specific Question:  Reason for exam:     Answer:  vomiting and diarrhea x 48 hours, hypoactive bowel sounds    Order Specific Question:  Is the patient pregnant?    Answer:  No    Order Specific Question:  Preferred imaging location?    Answer:  External  . Comprehensive metabolic panel    Order Specific Question:  Has the patient fasted?    Answer:  No  . Lipase  . Orthostatic vital signs  . POCT CBC  . POCT urinalysis dipstick  . POCT Microscopic Urinalysis ([facility])  . POCT urine pregnancy    Meds ordered this encounter  Medications  . DISCONTD: ondansetron (ZOFRAN-ODT) disintegrating tablet 4 mg    Sig:   . ondansetron (ZOFRAN-ODT) disintegrating tablet 8 mg    Sig:   . ondansetron (ZOFRAN-ODT) 8 MG disintegrating tablet    Sig: Take 1 tablet (8 mg total) by mouth every 8 (eight) hours as needed for nausea or vomiting.    Dispense:  30 tablet    Refill:  0    I personally performed the services described in this documentation, which was scribed in my presence. The recorded information has been reviewed and considered, and addended by me as needed.  Delman Cheadle, MD MPH

## 2015-06-06 LAB — COMPREHENSIVE METABOLIC PANEL
ALT: 37 U/L — ABNORMAL HIGH (ref 6–29)
AST: 24 U/L (ref 10–30)
Albumin: 4.4 g/dL (ref 3.6–5.1)
Alkaline Phosphatase: 74 U/L (ref 33–115)
BUN: 12 mg/dL (ref 7–25)
CO2: 25 mmol/L (ref 20–31)
Calcium: 9.8 mg/dL (ref 8.6–10.2)
Chloride: 103 mmol/L (ref 98–110)
Creat: 1 mg/dL (ref 0.50–1.10)
Glucose, Bld: 105 mg/dL — ABNORMAL HIGH (ref 65–99)
Potassium: 4.7 mmol/L (ref 3.5–5.3)
Sodium: 138 mmol/L (ref 135–146)
Total Bilirubin: 0.6 mg/dL (ref 0.2–1.2)
Total Protein: 6.8 g/dL (ref 6.1–8.1)

## 2015-06-06 LAB — LIPASE: Lipase: 53 U/L (ref 7–60)

## 2015-09-19 ENCOUNTER — Inpatient Hospital Stay (HOSPITAL_COMMUNITY)
Admission: AD | Admit: 2015-09-19 | Discharge: 2015-09-19 | Disposition: A | Discharge status: Home / Self Care | Payer: BLUE CROSS/BLUE SHIELD | Source: Ambulatory Visit | Attending: Obstetrics & Gynecology | Admitting: Obstetrics & Gynecology

## 2015-09-19 ENCOUNTER — Encounter (HOSPITAL_COMMUNITY): Payer: Self-pay | Admitting: *Deleted

## 2015-09-19 DIAGNOSIS — G43909 Migraine, unspecified, not intractable, without status migrainosus: Secondary | ICD-10-CM | POA: Insufficient documentation

## 2015-09-19 DIAGNOSIS — Z87891 Personal history of nicotine dependence: Secondary | ICD-10-CM | POA: Diagnosis not present

## 2015-09-19 DIAGNOSIS — G43811 Other migraine, intractable, with status migrainosus: Secondary | ICD-10-CM | POA: Diagnosis not present

## 2015-09-19 DIAGNOSIS — F988 Other specified behavioral and emotional disorders with onset usually occurring in childhood and adolescence: Secondary | ICD-10-CM | POA: Insufficient documentation

## 2015-09-19 DIAGNOSIS — F329 Major depressive disorder, single episode, unspecified: Secondary | ICD-10-CM | POA: Insufficient documentation

## 2015-09-19 DIAGNOSIS — E7800 Pure hypercholesterolemia, unspecified: Secondary | ICD-10-CM | POA: Diagnosis not present

## 2015-09-19 DIAGNOSIS — R51 Headache: Secondary | ICD-10-CM | POA: Diagnosis present

## 2015-09-19 LAB — CBC
HCT: 45.3 % (ref 36.0–46.0)
Hemoglobin: 16.3 g/dL — ABNORMAL HIGH (ref 12.0–15.0)
MCH: 30.6 pg (ref 26.0–34.0)
MCHC: 36 g/dL (ref 30.0–36.0)
MCV: 85 fL (ref 78.0–100.0)
Platelets: 296 10*3/uL (ref 150–400)
RBC: 5.33 MIL/uL — ABNORMAL HIGH (ref 3.87–5.11)
RDW: 12.3 % (ref 11.5–15.5)
WBC: 10.8 10*3/uL — ABNORMAL HIGH (ref 4.0–10.5)

## 2015-09-19 LAB — URINALYSIS, ROUTINE W REFLEX MICROSCOPIC
Bilirubin Urine: NEGATIVE
Glucose, UA: NEGATIVE mg/dL
Hgb urine dipstick: NEGATIVE
Ketones, ur: NEGATIVE mg/dL
Leukocytes, UA: NEGATIVE
Nitrite: NEGATIVE
Protein, ur: NEGATIVE mg/dL
Specific Gravity, Urine: 1.015 (ref 1.005–1.030)
pH: 6 (ref 5.0–8.0)

## 2015-09-19 LAB — COMPREHENSIVE METABOLIC PANEL
ALT: 56 U/L — ABNORMAL HIGH (ref 14–54)
AST: 43 U/L — ABNORMAL HIGH (ref 15–41)
Albumin: 4.1 g/dL (ref 3.5–5.0)
Alkaline Phosphatase: 79 U/L (ref 38–126)
Anion gap: 7 (ref 5–15)
BUN: 12 mg/dL (ref 6–20)
CO2: 27 mmol/L (ref 22–32)
Calcium: 9.2 mg/dL (ref 8.9–10.3)
Chloride: 99 mmol/L — ABNORMAL LOW (ref 101–111)
Creatinine, Ser: 0.75 mg/dL (ref 0.44–1.00)
GFR calc Af Amer: >60 mL/min (ref >60)
GFR calc non Af Amer: >60 mL/min (ref >60)
Glucose, Bld: 133 mg/dL — ABNORMAL HIGH (ref 65–99)
Potassium: 5.5 mmol/L — ABNORMAL HIGH (ref 3.5–5.1)
Sodium: 133 mmol/L — ABNORMAL LOW (ref 135–145)
Total Bilirubin: 1.1 mg/dL (ref 0.3–1.2)
Total Protein: 7 g/dL (ref 6.5–8.1)

## 2015-09-19 LAB — POCT PREGNANCY, URINE: Preg Test, Ur: NEGATIVE

## 2015-09-19 MED ORDER — BUTALBITAL-APAP-CAFFEINE 50-325-40 MG PO TABS: 1.0000 | ORAL_TABLET | Freq: Four times a day (QID) | ORAL | Status: DC | PRN

## 2015-09-19 MED ORDER — DEXAMETHASONE SODIUM PHOSPHATE 10 MG/ML IJ SOLN
10.0000 mg | Freq: Once | INTRAMUSCULAR | Status: AC
  Administered 2015-09-19: 10 mg via INTRAVENOUS
  Filled 2015-09-19: qty 1

## 2015-09-19 MED ORDER — DIPHENHYDRAMINE HCL 50 MG/ML IJ SOLN
25.0000 mg | Freq: Once | INTRAMUSCULAR | Status: AC
  Administered 2015-09-19: 25 mg via INTRAVENOUS
  Filled 2015-09-19: qty 1

## 2015-09-19 MED ORDER — METOCLOPRAMIDE HCL 5 MG/ML IJ SOLN
10.0000 mg | Freq: Once | INTRAMUSCULAR | Status: AC
  Administered 2015-09-19: 10 mg via INTRAVENOUS
  Filled 2015-09-19: qty 2

## 2015-09-19 MED ORDER — DIPHENHYDRAMINE HCL 25 MG PO CAPS: 25.0000 mg | ORAL_CAPSULE | Freq: Once | ORAL | Status: DC

## 2015-09-19 MED ORDER — LACTATED RINGERS IV BOLUS (SEPSIS)
1000.0000 mL | Freq: Once | INTRAVENOUS | Status: AC
  Administered 2015-09-19: 1000 mL via INTRAVENOUS

## 2015-09-19 NOTE — MAU Note (Signed)
Pt states she is having severe migraine since last Wednesday.  Pt states she was given medications at urgent care last wednesday that did not help.

## 2015-09-19 NOTE — MAU Note (Addendum)
Pt reports migraine since Wednesday. Pt went to an urgent care on Thursday, got "shot" and a a prescription for tramadol which is not working. Pt last took Tramadol this morning. Pt vomited this morning and feels a little nauseated now. Pt took Zofran yesterday.

## 2015-09-19 NOTE — MAU Provider Note (Signed)
History     CSN: [id number]  Arrival date and time: 09/19/15 1031   None     Chief Complaint  Patient presents with  . Migraine   HPI Pt is not pregnant [id number] and presents with right sided headache that started last Wednesday.  Pt has photophobia, has Nausea and vomiting Pt throws up a lot and had an appointment with a gastroenterologist, but canceled  Appointment. Pt's headache is located on right side of head- starting at neck and working up to right temporal/occipital. Pt was seen in Outpatient ED and given "shot"( ?Toradol?) Tramadol and zofran which she said has not helped.  Pt sees Dr. Toy Care for ADD which she takes Adderrall and also takes Klonopin 1 to 2 times weekly for anxiety. Pt thinks this headache was brought on by stress.  Mother is with pt and states she also has hx of migraines that started about pt's age. Pt has never seen a neurologist and does not have a PCP.  Pt has insurance. Pt is not sexually active at this time.  Pt has RCM- headaches do not seem to be related to menstrual cycle. RN note: Original Note by Ishmael Holter, RN (Registered Nurse) filed at 09/19/2015 10:49 AM   Expand All Collapse All   Pt reports migraine since Wednesday. Pt went to an urgent care on Thursday, got "shot" and a a prescription for tramadol which is not working. Pt last took Tramadol this morning. Pt vomited this morning and feels a little nauseated now. Pt took Zofran yesterday.       Past Medical History  Diagnosis Date  . ADD (attention deficit disorder)   . Frequent headaches   . Elevated blood pressure   . Edema of both legs   . Chicken pox   . Elevated cholesterol   . Ectopic pregnancy   . Depression     Past Surgical History  Procedure Laterality Date  . Wisedom teeth    . Laparoscopy N/A 09/16/2012    Procedure: LAPAROSCOPY OPERATIVE;  Surgeon: Allena Katz, MD;  Location: [facility];  Service: Gynecology;  Laterality: N/A;  Evacuation Hemoperitoneum  .  Unilateral salpingectomy Right 09/16/2012    Procedure: UNILATERAL SALPINGECTOMY;  Surgeon: Allena Katz, MD;  Location: [facility];  Service: Gynecology;  Laterality: Right;    Family History  Problem Relation Age of Onset  . Hypertension Mother     Living  . Diabetes Father 58    Deceased  . Heart attack Father   . Sudden death Father   . Hyperlipidemia Neg Hx   . Bipolar disorder Maternal Grandmother   . Diabetes Maternal Grandfather   . Alcohol abuse Paternal Grandfather   . Liver disease Paternal Grandfather   . Fibromyalgia Mother   . Healthy Sister     Social History  Substance Use Topics  . Smoking status: Former Smoker -- 0.00 packs/day  . Smokeless tobacco: Never Used  . Alcohol Use: Yes    Allergies: No Known Allergies  Prescriptions prior to admission  Medication Sig Dispense Refill Last Dose  . amphetamine-dextroamphetamine (ADDERALL) 20 MG tablet Take 20 mg by mouth 4 (four) times daily.    09/19/2015 at Unknown time  . ClonazePAM (KLONOPIN PO) Take by mouth.   Past Week at Unknown time  . ondansetron (ZOFRAN-ODT) 8 MG disintegrating tablet Take 1 tablet (8 mg total) by mouth every 8 (eight) hours as needed for nausea or vomiting. 30 tablet 0 09/18/2015 at Unknown  time    Review of Systems  Constitutional: Negative for fever and chills.  HENT: Negative for hearing loss and tinnitus.   Eyes: Positive for photophobia. Negative for blurred vision and double vision.  Gastrointestinal: Positive for nausea and vomiting. Negative for diarrhea and constipation.  Genitourinary: Negative for dysuria and urgency.  Musculoskeletal: Positive for back pain.       Pt states she has lots of back issues  Neurological: Positive for headaches.  Psychiatric/Behavioral: Positive for depression. The patient is nervous/anxious.        Pt states she is not depressed at this time; Pt has anxiety issues 1 to 2 times weekly- Rx Klonopin   Physical Exam   Blood pressure 128/87,  pulse 98, temperature 97.7 F (36.5 C), temperature source Oral, resp. rate 16, height 5\' 5"  (1.651 m), weight 225 lb 6.4 oz (102.241 kg), last menstrual period 08/31/2015.  Physical Exam  Nursing note and vitals reviewed. Constitutional: She is oriented to person, place, and time. She appears well-developed and well-nourished. No distress.  HENT:  Head: Normocephalic.  Eyes: Pupils are equal, round, and reactive to light.  Neck: Normal range of motion. Neck supple.  Cardiovascular: Normal rate.   Respiratory: Effort normal. No respiratory distress.  GI: Soft.  Musculoskeletal: Normal range of motion.  Neurological: She is alert and oriented to person, place, and time.  Skin: Skin is warm and dry.  Psychiatric: She has a normal mood and affect. Her behavior is normal.    MAU Course  Procedures LR with Reglan 10mg , Decadron 10 mg and Benadryl 25mg  Mother with pt Pt got total relief from headache with headache cocktail- able to tolerate fluids and ready to go home Discussed with pt and mother importance of getting PCP and referrals as needed Also to discuss headaches with Dr. Toy Care Results for orders placed or performed during the hospital encounter of 09/19/15 (from the past 24 hour(s))  Urinalysis, Routine w reflex microscopic (not at [facility])     Status: None   Collection Time: 09/19/15 10:37 AM  Result Value Ref Range   Color, Urine YELLOW YELLOW   APPearance CLEAR CLEAR   Specific Gravity, Urine 1.015 1.005 - 1.030   pH 6.0 5.0 - 8.0   Glucose, UA NEGATIVE NEGATIVE mg/dL   Hgb urine dipstick NEGATIVE NEGATIVE   Bilirubin Urine NEGATIVE NEGATIVE   Ketones, ur NEGATIVE NEGATIVE mg/dL   Protein, ur NEGATIVE NEGATIVE mg/dL   Nitrite NEGATIVE NEGATIVE   Leukocytes, UA NEGATIVE NEGATIVE  Pregnancy, urine POC     Status: None   Collection Time: 09/19/15 10:55 AM  Result Value Ref Range   Preg Test, Ur NEGATIVE NEGATIVE  CBC     Status: Abnormal   Collection Time: 09/19/15 11:35  AM  Result Value Ref Range   WBC 10.8 (H) 4.0 - 10.5 K/uL   RBC 5.33 (H) 3.87 - 5.11 MIL/uL   Hemoglobin 16.3 (H) 12.0 - 15.0 g/dL   HCT 45.3 36.0 - 46.0 %   MCV 85.0 78.0 - 100.0 fL   MCH 30.6 26.0 - 34.0 pg   MCHC 36.0 30.0 - 36.0 g/dL   RDW 12.3 11.5 - 15.5 %   Platelets 296 150 - 400 K/uL  Comprehensive metabolic panel     Status: Abnormal   Collection Time: 09/19/15 11:35 AM  Result Value Ref Range   Sodium 133 (L) 135 - 145 mmol/L   Potassium 5.5 (H) 3.5 - 5.1 mmol/L   Chloride 99 (L) 101 -  111 mmol/L   CO2 27 22 - 32 mmol/L   Glucose, Bld 133 (H) 65 - 99 mg/dL   BUN 12 6 - 20 mg/dL   Creatinine, Ser 0.75 0.44 - 1.00 mg/dL   Calcium 9.2 8.9 - 10.3 mg/dL   Total Protein 7.0 6.5 - 8.1 g/dL   Albumin 4.1 3.5 - 5.0 g/dL   AST 43 (H) 15 - 41 U/L   ALT 56 (H) 14 - 54 U/L   Alkaline Phosphatase 79 38 - 126 U/L   Total Bilirubin 1.1 0.3 - 1.2 mg/dL   GFR calc non Af Amer >60 >60 mL/min   GFR calc Af Amer >60 >60 mL/min   Anion gap 7 5 - 15   Assessment and Plan  Migraine headache- resolved in MAU; Rx Fioricet Discussed limitation NSAID due to slightly elevated LFT Stressed importance of follow up with PCP  Rowin Bayron 09/19/2015, 11:02 AM

## 2015-09-21 ENCOUNTER — Emergency Department (HOSPITAL_COMMUNITY)
Admission: EM | Admit: 2015-09-21 | Discharge: 2015-09-21 | Disposition: A | Discharge status: Home / Self Care | Payer: BLUE CROSS/BLUE SHIELD | Attending: Emergency Medicine | Admitting: Emergency Medicine

## 2015-09-21 ENCOUNTER — Emergency Department (HOSPITAL_COMMUNITY): Payer: BLUE CROSS/BLUE SHIELD

## 2015-09-21 DIAGNOSIS — Z79899 Other long term (current) drug therapy: Secondary | ICD-10-CM | POA: Diagnosis not present

## 2015-09-21 DIAGNOSIS — Z79891 Long term (current) use of opiate analgesic: Secondary | ICD-10-CM | POA: Insufficient documentation

## 2015-09-21 DIAGNOSIS — Z87891 Personal history of nicotine dependence: Secondary | ICD-10-CM | POA: Diagnosis not present

## 2015-09-21 DIAGNOSIS — E7800 Pure hypercholesterolemia, unspecified: Secondary | ICD-10-CM | POA: Insufficient documentation

## 2015-09-21 DIAGNOSIS — R55 Syncope and collapse: Secondary | ICD-10-CM | POA: Diagnosis not present

## 2015-09-21 DIAGNOSIS — R519 Headache, unspecified: Secondary | ICD-10-CM

## 2015-09-21 DIAGNOSIS — R51 Headache: Secondary | ICD-10-CM | POA: Diagnosis not present

## 2015-09-21 DIAGNOSIS — Z7982 Long term (current) use of aspirin: Secondary | ICD-10-CM | POA: Insufficient documentation

## 2015-09-21 DIAGNOSIS — F909 Attention-deficit hyperactivity disorder, unspecified type: Secondary | ICD-10-CM | POA: Insufficient documentation

## 2015-09-21 DIAGNOSIS — F329 Major depressive disorder, single episode, unspecified: Secondary | ICD-10-CM | POA: Diagnosis not present

## 2015-09-21 LAB — BASIC METABOLIC PANEL
Anion gap: 10 (ref 5–15)
BUN: 15 mg/dL (ref 6–20)
CO2: 26 mmol/L (ref 22–32)
Calcium: 9 mg/dL (ref 8.9–10.3)
Chloride: 107 mmol/L (ref 101–111)
Creatinine, Ser: 0.78 mg/dL (ref 0.44–1.00)
GFR calc Af Amer: >60 mL/min (ref >60)
GFR calc non Af Amer: >60 mL/min (ref >60)
Glucose, Bld: 80 mg/dL (ref 65–99)
Potassium: 4.6 mmol/L (ref 3.5–5.1)
Sodium: 143 mmol/L (ref 135–145)

## 2015-09-21 MED ORDER — VALPROATE SODIUM 500 MG/5ML IV SOLN
500.0000 mg | Freq: Once | INTRAVENOUS | Status: AC
  Administered 2015-09-21: 500 mg via INTRAVENOUS
  Filled 2015-09-21: qty 5

## 2015-09-21 MED ORDER — PROCHLORPERAZINE EDISYLATE 5 MG/ML IJ SOLN
10.0000 mg | Freq: Once | INTRAMUSCULAR | Status: AC
  Administered 2015-09-21: 10 mg via INTRAVENOUS
  Filled 2015-09-21: qty 2

## 2015-09-21 MED ORDER — SODIUM CHLORIDE 0.9 % IV BOLUS (SEPSIS)
1000.0000 mL | Freq: Once | INTRAVENOUS | Status: AC
  Administered 2015-09-21: 1000 mL via INTRAVENOUS

## 2015-09-21 MED ORDER — KETOROLAC TROMETHAMINE 30 MG/ML IJ SOLN
30.0000 mg | Freq: Once | INTRAMUSCULAR | Status: AC
Start: 2015-09-21 — End: 2015-09-21
  Administered 2015-09-21: 30 mg via INTRAVENOUS
  Filled 2015-09-21: qty 1

## 2015-09-21 NOTE — ED Provider Notes (Signed)
CSN: [id number]     Arrival date & time 09/21/15  1048 History   First MD Initiated Contact with Patient 09/21/15 1125     Chief Complaint  Patient presents with  . Headache  . Loss of Consciousness      Patient is a 25 y.o. female presenting with headaches and syncope.  Headache Associated symptoms: nausea, photophobia, syncope and vomiting   Associated symptoms: no back pain   Loss of Consciousness Associated symptoms: headaches, nausea and vomiting   Associated symptoms: no chest pain and no shortness of breath   Patient set a headache for the last week. It is from the back of her head going to the front or headache. Patient denies history of headaches, or looking past medical history says she does have a history of frequent headaches. Seen at [facility] 2 days ago and treated for migraines. Given migraine cocktail states she felt better. States the headache came back around 2 hours later. She has had nausea and now some vomiting. States she's felt weak. States she had some blurred vision. Mother has a history of migraines. She had a syncopal episode today while at work. Still continues to have a headache. No abdominal pain. Lab work reviewed from previous visit. Denies localizing numbness or weakness.  Past Medical History  Diagnosis Date  . ADD (attention deficit disorder)   . Frequent headaches   . Elevated blood pressure   . Edema of both legs   . Chicken pox   . Elevated cholesterol   . Ectopic pregnancy   . Depression    Past Surgical History  Procedure Laterality Date  . Wisedom teeth    . Laparoscopy N/A 09/16/2012    Procedure: LAPAROSCOPY OPERATIVE;  Surgeon: Allena Katz, MD;  Location: [facility];  Service: Gynecology;  Laterality: N/A;  Evacuation Hemoperitoneum  . Unilateral salpingectomy Right 09/16/2012    Procedure: UNILATERAL SALPINGECTOMY;  Surgeon: Allena Katz, MD;  Location: [facility];  Service: Gynecology;  Laterality: Right;   Family History   Problem Relation Age of Onset  . Hypertension Mother     Living  . Diabetes Father 8    Deceased  . Heart attack Father   . Sudden death Father   . Hyperlipidemia Neg Hx   . Bipolar disorder Maternal Grandmother   . Diabetes Maternal Grandfather   . Alcohol abuse Paternal Grandfather   . Liver disease Paternal Grandfather   . Fibromyalgia Mother   . Healthy Sister    Social History  Substance Use Topics  . Smoking status: Former Smoker -- 0.00 packs/day  . Smokeless tobacco: Never Used  . Alcohol Use: Yes   OB History    Gravida Para Term Preterm AB TAB SAB Ectopic Multiple Living   1    1   1        Review of Systems  Constitutional: Negative for appetite change.  HENT: Negative for facial swelling and trouble swallowing.   Eyes: Positive for photophobia and visual disturbance.  Respiratory: Negative for shortness of breath.   Cardiovascular: Positive for syncope. Negative for chest pain.  Gastrointestinal: Positive for nausea and vomiting.  Musculoskeletal: Negative for back pain.  Neurological: Positive for headaches.      Allergies  Review of patient's allergies indicates no known allergies.  Home Medications   Prior to Admission medications   Medication Sig Start Date End Date Taking? Authorizing Provider  amphetamine-dextroamphetamine (ADDERALL) 20 MG tablet Take 20 mg by mouth 4 (  four) times daily.    Yes Historical Provider, MD  aspirin-acetaminophen-caffeine (EXCEDRIN MIGRAINE) [phone] MG tablet Take by mouth every 6 (six) hours as needed for headache.   Yes Historical Provider, MD  butalbital-acetaminophen-caffeine (FIORICET) 50-325-40 MG tablet Take 1-2 tablets by mouth every 6 (six) hours as needed for headache. 09/19/15 09/18/16 Yes West Pugh, NP  clonazePAM (KLONOPIN) 1 MG tablet Take 1 mg by mouth 2 (two) times daily as needed for anxiety.  09/05/15  Yes Historical Provider, MD  ondansetron (ZOFRAN-ODT) 4 MG disintegrating tablet Take 4 mg by  mouth 3 (three) times daily as needed. 07/20/15  Yes Historical Provider, MD  traMADol (ULTRAM) 50 MG tablet Take 50 mg by mouth 2 (two) times daily as needed.  09/16/15  Yes Historical Provider, MD  ondansetron (ZOFRAN-ODT) 8 MG disintegrating tablet Take 1 tablet (8 mg total) by mouth every 8 (eight) hours as needed for nausea or vomiting. Patient not taking: Reported on 09/21/2015 06/05/15   Shawnee Knapp, MD   BP 109/71 mmHg  Pulse 74  Temp(Src) 98.1 F (36.7 C) (Oral)  Resp 18  SpO2 99%  LMP 08/31/2015 Physical Exam  Constitutional: She is oriented to person, place, and time. She appears well-developed and well-nourished.  HENT:  Head: Atraumatic.  Eyes: EOM are normal. Pupils are equal, round, and reactive to light.  Neck: Neck supple.  No meningismus.  Cardiovascular: Normal rate and regular rhythm.   Pulmonary/Chest: Effort normal.  Abdominal: Soft. There is no tenderness.  Musculoskeletal: Normal range of motion.  Neurological: She is alert and oriented to person, place, and time.  Skin: Skin is warm.  Psychiatric: She has a normal mood and affect.    ED Course  Procedures (including critical care time) Labs Review Labs Reviewed  BASIC METABOLIC PANEL    Imaging Review Ct Head Wo Contrast  09/21/2015  CLINICAL DATA:  Headache EXAM: CT HEAD WITHOUT CONTRAST TECHNIQUE: Contiguous axial images were obtained from the base of the skull through the vertex without intravenous contrast. COMPARISON:  None. FINDINGS: Brain: No intracranial hemorrhage, mass effect or midline shift. No acute cortical infarction. No mass lesion is noted on this unenhanced scan. No hydrocephalus. No intra or extra-axial fluid collection. Vascular: No hyperdense vessel or unexpected calcification. Skull: Negative for fracture or focal lesion. Sinuses/Orbits: No acute findings. Other: None. IMPRESSION: No acute intracranial abnormality. No hydrocephalus. No acute cortical infarction. Electronically Signed   By:  Lahoma Crocker M.D.   On: 09/21/2015 12:10   I have personally reviewed and evaluated these images and lab results as part of my medical decision-making.   EKG Interpretation   Date/Time:  Thursday Sep 21 2015 12:08:57 EDT Ventricular Rate:  79 PR Interval:  121 QRS Duration: 82 QT Interval:  361 QTC Calculation: 414 R Axis:   50 Text Interpretation:  Sinus rhythm T waves mildly more promenant.   Confirmed by Alvino Chapel  MD, Ovid Curd [phone]) on 09/21/2015 1:00:50 PM      MDM   Final diagnoses:  Acute nonintractable headache, unspecified headache type    Patient with headache and near syncope/syncope. Patient's had headaches recently. Head CT done. Feels better after treatment. Will discharge home.    Davonna Belling, MD 09/22/15 [phone]

## 2015-09-21 NOTE — ED Notes (Signed)
Patient is alert and oriented x3.  She was given DC instructions and follow up visit instructions.  Patient gave verbal understanding. She was DC ambulatory under her own power to home.  V/S stable.  He was not showing any signs of distress on DC 

## 2015-09-21 NOTE — ED Notes (Signed)
MD at bedside. 

## 2015-09-21 NOTE — ED Notes (Signed)
Bed: [id number] Expected date:  Expected time:  Means of arrival:  Comments: EMS- 24yo F, migraine/syncope

## 2015-09-21 NOTE — ED Notes (Signed)
Per [facility]- Pt works at [employer] this am pt reports went to restroom and "passed out" unknown down time. Pt recovered without event. Pt alert and orient x 4 with immediate recall. Ambulatory on scene. NEG for STROKE. NEG postictal. [facility] and was given "cocktail". Pt states was given some relief however HA returned. Pt before event c/o of nausea with blurred vision with HA. Pt states similar symptoms with episode she has earlier this week.  Pt has no other complaints.

## 2015-09-21 NOTE — ED Notes (Signed)
Patient transported to CT 

## 2015-09-21 NOTE — Discharge Instructions (Signed)

## 2015-09-28 ENCOUNTER — Ambulatory Visit (INDEPENDENT_AMBULATORY_CARE_PROVIDER_SITE_OTHER): Payer: BLUE CROSS/BLUE SHIELD | Admitting: Internal Medicine

## 2015-09-28 ENCOUNTER — Ambulatory Visit (INDEPENDENT_AMBULATORY_CARE_PROVIDER_SITE_OTHER)
Admission: RE | Admit: 2015-09-28 | Discharge: 2015-09-28 | Disposition: A | Discharge status: Home / Self Care | Payer: BLUE CROSS/BLUE SHIELD | Source: Ambulatory Visit | Attending: Internal Medicine | Admitting: Internal Medicine

## 2015-09-28 ENCOUNTER — Encounter: Payer: Self-pay | Admitting: Internal Medicine

## 2015-09-28 VITALS — BP 124/80 | HR 84 | Temp 98.7°F | Ht 64.5 in | Wt 228.0 lb

## 2015-09-28 DIAGNOSIS — M545 Low back pain, unspecified: Secondary | ICD-10-CM

## 2015-09-28 DIAGNOSIS — F988 Other specified behavioral and emotional disorders with onset usually occurring in childhood and adolescence: Secondary | ICD-10-CM

## 2015-09-28 DIAGNOSIS — E669 Obesity, unspecified: Secondary | ICD-10-CM

## 2015-09-28 DIAGNOSIS — F329 Major depressive disorder, single episode, unspecified: Secondary | ICD-10-CM

## 2015-09-28 DIAGNOSIS — F32A Depression, unspecified: Secondary | ICD-10-CM

## 2015-09-28 DIAGNOSIS — G44209 Tension-type headache, unspecified, not intractable: Secondary | ICD-10-CM

## 2015-09-28 DIAGNOSIS — F909 Attention-deficit hyperactivity disorder, unspecified type: Secondary | ICD-10-CM

## 2015-09-28 DIAGNOSIS — F419 Anxiety disorder, unspecified: Secondary | ICD-10-CM

## 2015-09-28 DIAGNOSIS — F418 Other specified anxiety disorders: Secondary | ICD-10-CM | POA: Diagnosis not present

## 2015-09-28 DIAGNOSIS — E785 Hyperlipidemia, unspecified: Secondary | ICD-10-CM

## 2015-09-28 MED ORDER — AMITRIPTYLINE HCL 25 MG PO TABS: 25.0000 mg | ORAL_TABLET | Freq: Every evening | ORAL | Status: DC | PRN

## 2015-09-28 NOTE — Progress Notes (Signed)
HPI  Pt presents to the clinic today to establish care and for management of the conditions listed below. She is transferring care from [place], [place].  ADD: She has been evaluated and treated by Dr. Toy Care. She is taking Adderall 20 mg TID as prescribed.  Frequent Headaches: She has headaches every day. These occur in the forehead. She describes the pain as sharp and stabbing. She does have some associated dizziness and blurred vision. She has had some loss of peripheral vision. She has had some sensitivity to light and sound. She takes Ibuprofen without any relief. She has been seen in the ER 2 x this week for the same. She was given a cocktail of IV medications in the ER which helped for 2 hours, but then her headache came back.   HLD: LDL 164, 10/2013. She has never taken any cholesterol lowering medication.  Anxiety and Depression: She follows with Dr. Toy Care. She takes Clonazepam as needed.  She does have some issues with back pain. This started 4 years ago. Mainly in her lower back. She describes the pain as sharp and stabbing. The pain is in the middle of her back. The pain does not radiate down her legs. She denies numbness, tingling, loss of bowel or bladder. She takes Aleve with minimal relief. She has had an xray of her lower back but is not sure what it showed. She has seen a chiropractor in the past without any relief.  She also has loose stools. This occurs every time after she eats. She very rarely has constipation. She denies blood in her stool. She does have external hemorrhoid. She is not taking anything OTC for this.  Flu: never Tetanus: 2008 Pap Smear: 05/2015 at [facility] Dentist: annually  Past Medical History  Diagnosis Date  . ADD (attention deficit disorder)   . Frequent headaches   . Elevated blood pressure   . Edema of both legs   . Chicken pox   . Elevated cholesterol   . Ectopic pregnancy   . Depression     Current Outpatient Prescriptions   Medication Sig Dispense Refill  . amphetamine-dextroamphetamine (ADDERALL) 20 MG tablet 1 tablet 3 times daily  0  . clonazePAM (KLONOPIN) 1 MG tablet Take 1 mg by mouth 2 (two) times daily as needed for anxiety.   0  . fluticasone (FLONASE) 50 MCG/ACT nasal spray Place 2 sprays into both nostrils daily as needed.   0  . ibuprofen (ADVIL,MOTRIN) 200 MG tablet Take 800 mg by mouth daily as needed.     No current facility-administered medications for this visit.    No Known Allergies  Family History  Problem Relation Age of Onset  . Hypertension Mother     Living  . Diabetes Father 72    Deceased  . Heart attack Father   . Sudden death Father   . Hyperlipidemia Neg Hx   . Bipolar disorder Maternal Grandmother   . Diabetes Maternal Grandfather   . Alcohol abuse Paternal Grandfather   . Liver disease Paternal Grandfather   . Fibromyalgia Mother   . Healthy Sister     Social History   Social History  . Marital Status: Single    Spouse Name: N/A  . Number of Children: N/A  . Years of Education: N/A   Occupational History  . Not on file.   Social History Main Topics  . Smoking status: Former Smoker -- 0.00 packs/day  . Smokeless tobacco: Never Used  . Alcohol  Use: 0.0 oz/week    0 Standard drinks or equivalent per week     Comment: social  . Drug Use: No  . Sexual Activity: Yes    Birth Control/ Protection: None   Other Topics Concern  . Not on file   Social History Narrative    ROS:  Constitutional: Pt reports weight gain and headaches. Denies fever, malaise, fatigue.  HEENT: Denies eye pain, eye redness, ear pain, ringing in the ears, wax buildup, runny nose, nasal congestion, bloody nose, or sore throat. Respiratory: Denies difficulty breathing, shortness of breath, cough or sputum production.   Cardiovascular: Denies chest pain, chest tightness, palpitations or swelling in the hands or feet.  Gastrointestinal: Pt reports loose stool. Denies abdominal pain,  bloating, constipation, diarrhea or blood in the stool.  GU: Denies frequency, urgency, pain with urination, blood in urine, odor or discharge. Musculoskeletal: Pt reports back pain. Denies decrease in range of motion, difficulty with gait, muscle pain or joint swelling.  Skin: Denies redness, rashes, lesions or ulcercations.  Neurological: Denies dizziness, difficulty with memory, difficulty with speech or problems with balance and coordination.  Psych: Pt has history of anxiety and depression. Denies SI/HI.  No other specific complaints in a complete review of systems (except as listed in HPI above).  PE:  BP 124/80 mmHg  Pulse 84  Temp(Src) 98.7 F (37.1 C) (Oral)  Ht 5' 4.5" (1.638 m)  Wt 228 lb (103.42 kg)  BMI 38.55 kg/m2  SpO2 98%  LMP 09/26/2015  Wt Readings from Last 3 Encounters:  09/28/15 228 lb (103.42 kg)  09/19/15 225 lb 6.4 oz (102.241 kg)  06/05/15 215 lb 9.6 oz (97.796 kg)    General: Appears her stated age, obese in NAD. HEENT: Head: normal shape and size; Eyes: sclera white, no icterus, conjunctiva pink, PERRLA and EOMs intact;  Cardiovascular: Normal rate and rhythm. S1,S2 noted.  No murmur, rubs or gallops noted.  Pulmonary/Chest: Normal effort and positive vesicular breath sounds. No respiratory distress. No wheezes, rales or ronchi noted.  Abdomen: Soft and nontender. Normal bowel sounds. No distention or masses noted.  Musculoskeletal: Normal flexion, extension and rotation of the spine. Bony tenderness noted over the lumbar spine. Strength 5/5 BUE/BLE. No difficulty with gait.  Neurological: Alert and oriented. Coordination normal.  Psychiatric: Mood and affect normal. Behavior is normal. Judgment and thought content normal.     BMET    Component Value Date/Time   NA 143 09/21/2015 1230   K 4.6 09/21/2015 1230   CL 107 09/21/2015 1230   CO2 26 09/21/2015 1230   GLUCOSE 80 09/21/2015 1230   BUN 15 09/21/2015 1230   CREATININE 0.78 09/21/2015 1230    CREATININE 1.00 06/05/2015 2228   CALCIUM 9.0 09/21/2015 1230   GFRNONAA >60 09/21/2015 1230   GFRAA >60 09/21/2015 1230    Lipid Panel     Component Value Date/Time   CHOL 224* 11/01/2013 1051   TRIG 141 11/01/2013 1051   HDL 32* 11/01/2013 1051   CHOLHDL 7.0 11/01/2013 1051   VLDL 28 11/01/2013 1051   LDLCALC 164* 11/01/2013 1051    CBC    Component Value Date/Time   WBC 10.8* 09/19/2015 1135   WBC 11.3* 06/05/2015 2226   RBC 5.33* 09/19/2015 1135   RBC 5.33 06/05/2015 2226   HGB 16.3* 09/19/2015 1135   HGB 15.8 06/05/2015 2226   HCT 45.3 09/19/2015 1135   HCT 45.5 06/05/2015 2226   PLT 296 09/19/2015 1135   MCV  85.0 09/19/2015 1135   MCV 85.4 06/05/2015 2226   MCH 30.6 09/19/2015 1135   MCH 29.7 06/05/2015 2226   MCHC 36.0 09/19/2015 1135   MCHC 34.8 06/05/2015 2226   RDW 12.3 09/19/2015 1135   LYMPHSABS 2.5 11/01/2013 1051   MONOABS 0.6 11/01/2013 1051   EOSABS 0.2 11/01/2013 1051   BASOSABS 0.0 11/01/2013 1051    Hgb A1C Lab Results  Component Value Date   HGBA1C 5.1 11/01/2013     Assessment and Plan:  Loose stools:  ? IBS Encouraged her to start a probiotic Keep a diary of foods that trigger symptoms  RTC in 1 month to follow up headaches

## 2015-09-28 NOTE — Patient Instructions (Signed)
Tension Headache A tension headache is a feeling of pain, pressure, or aching that is often felt over the front and sides of the head. The pain can be dull, or it can feel tight (constricting). Tension headaches are not normally associated with nausea or vomiting, and they do not get worse with physical activity. Tension headaches can last from 30 minutes to several days. This is the most common type of headache. CAUSES The exact cause of this condition is not known. Tension headaches often begin after stress, anxiety, or depression. Other triggers may include:  Alcohol.  Too much caffeine, or caffeine withdrawal.  Respiratory infections, such as colds, flu, or sinus infections.  Dental problems or teeth clenching.  Fatigue.  Holding your head and neck in the same position for a long period of time, such as while using a computer.  Smoking. SYMPTOMS Symptoms of this condition include:  A feeling of pressure around the head.  Dull, aching head pain.  Pain felt over the front and sides of the head.  Tenderness in the muscles of the head, neck, and shoulders. DIAGNOSIS This condition may be diagnosed based on your symptoms and a physical exam. Tests may be done, such as a CT scan or an MRI of your head. These tests may be done if your symptoms are severe or unusual. TREATMENT This condition may be treated with lifestyle changes and medicines to help relieve symptoms. HOME CARE INSTRUCTIONS Managing Pain  Take over-the-counter and prescription medicines only as told by your health care provider.  Lie down in a dark, quiet room when you have a headache.  If directed, apply ice to the head and neck area:  Put ice in a plastic bag.  Place a towel between your skin and the bag.  Leave the ice on for 20 minutes, 2-3 times per day.  Use a heating pad or a hot shower to apply heat to the head and neck area as told by your health care provider. Eating and Drinking  Eat meals on  a regular schedule.  Limit alcohol use.  Decrease your caffeine intake, or stop using caffeine. General Instructions  Keep all follow-up visits as told by your health care provider. This is important.  Keep a headache journal to help find out what may trigger your headaches. For example, write down:  What you eat and drink.  How much sleep you get.  Any change to your diet or medicines.  Try massage or other relaxation techniques.  Limit stress.  Sit up straight, and avoid tensing your muscles.  Do not use tobacco products, including cigarettes, chewing tobacco, or e-cigarettes. If you need help quitting, ask your health care provider.  Exercise regularly as told by your health care provider.  Get 7-9 hours of sleep, or the amount recommended by your health care provider. SEEK MEDICAL CARE IF:  Your symptoms are not helped by medicine.  You have a headache that is different from what you normally experience.  You have nausea or you vomit.  You have a fever. SEEK IMMEDIATE MEDICAL CARE IF:  Your headache becomes severe.  You have repeated vomiting.  You have a stiff neck.  You have a loss of vision.  You have problems with speech.  You have pain in your eye or ear.  You have muscular weakness or loss of muscle control.  You lose your balance or you have trouble walking.  You feel faint or you pass out.  You have confusion.     This information is not intended to replace advice given to you by your health care provider. Make sure you discuss any questions you have with your health care provider.   Document Released: 04/29/2005 Document Revised: 01/18/2015 Document Reviewed: 08/22/2014 Elsevier Interactive Patient Education 2016 Elsevier Inc.  

## 2015-09-28 NOTE — Progress Notes (Signed)
Pre visit review using our clinic review tool, if applicable. No additional management support is needed unless otherwise documented below in the visit note. 

## 2015-09-29 DIAGNOSIS — E669 Obesity, unspecified: Secondary | ICD-10-CM | POA: Insufficient documentation

## 2015-09-29 DIAGNOSIS — M545 Low back pain, unspecified: Secondary | ICD-10-CM | POA: Insufficient documentation

## 2015-09-29 DIAGNOSIS — G44209 Tension-type headache, unspecified, not intractable: Secondary | ICD-10-CM | POA: Insufficient documentation

## 2015-09-29 DIAGNOSIS — E785 Hyperlipidemia, unspecified: Secondary | ICD-10-CM

## 2015-09-29 HISTORY — DX: Hyperlipidemia, unspecified: E78.5

## 2015-09-29 NOTE — Assessment & Plan Note (Signed)
Likely exacerbated by weight Xray of lumbar spine today

## 2015-09-29 NOTE — Assessment & Plan Note (Signed)
Continue to follow with Dr. Toy Care Continue Clonazepam as prescribed

## 2015-09-29 NOTE — Assessment & Plan Note (Signed)
She is requesting medication therapy to assist her with her weight loss Discussed hiring a Physiological scientist, and trying a wellness program such as herbalife or weight watchers

## 2015-09-29 NOTE — Assessment & Plan Note (Signed)
Encouraged her to consume a low fat diet Will check lipid profile at annual exam

## 2015-09-29 NOTE — Assessment & Plan Note (Signed)
Start Amitriptyline 25 mg QHS Ibuprofen as needed

## 2015-09-29 NOTE — Assessment & Plan Note (Signed)
Continue to follow with Dr. Toy Care Continue Adderall as prescribed

## 2015-09-30 ENCOUNTER — Emergency Department
Admission: EM | Admit: 2015-09-30 | Discharge: 2015-10-01 | Disposition: A | Discharge status: Home / Self Care | Payer: BLUE CROSS/BLUE SHIELD | Attending: Emergency Medicine | Admitting: Emergency Medicine

## 2015-09-30 ENCOUNTER — Encounter: Payer: Self-pay | Admitting: *Deleted

## 2015-09-30 DIAGNOSIS — Z87891 Personal history of nicotine dependence: Secondary | ICD-10-CM | POA: Diagnosis not present

## 2015-09-30 DIAGNOSIS — Z791 Long term (current) use of non-steroidal anti-inflammatories (NSAID): Secondary | ICD-10-CM | POA: Diagnosis not present

## 2015-09-30 DIAGNOSIS — E785 Hyperlipidemia, unspecified: Secondary | ICD-10-CM | POA: Diagnosis not present

## 2015-09-30 DIAGNOSIS — G44029 Chronic cluster headache, not intractable: Secondary | ICD-10-CM

## 2015-09-30 DIAGNOSIS — F909 Attention-deficit hyperactivity disorder, unspecified type: Secondary | ICD-10-CM | POA: Insufficient documentation

## 2015-09-30 DIAGNOSIS — Z79899 Other long term (current) drug therapy: Secondary | ICD-10-CM | POA: Insufficient documentation

## 2015-09-30 DIAGNOSIS — F329 Major depressive disorder, single episode, unspecified: Secondary | ICD-10-CM | POA: Insufficient documentation

## 2015-09-30 NOTE — ED Notes (Signed)
Pt presents w/ c/o headache x 1 hour. Pt denies vomiting, is tearful during triage. Pt c/o light hurting her eyes. Pt has seen PCP regarding frequent headaches but has not gotten rx for this filled, yet. Pt has taken ibuprofen 200 mg in past hour.

## 2015-09-30 NOTE — ED Notes (Addendum)
Pt states left frontal headache that began approx 1 hour pta. Pt without nuchal rigidity. Denies nausea, vomiting, diarrhea, fever. Skin normal color warm and dry. Lights dimmed for comfort. Covering provided to pt per request. Call bell at right side. Spouse at bedside.

## 2015-10-01 NOTE — Discharge Instructions (Signed)
Cluster Headache  Cluster headaches are recognized by their pattern of deep, intense head pain. They normally occur on one side of your head, but they may "switch sides" in subsequent episodes. Typically, cluster headaches:   · Are severe in nature.    · Occur repeatedly over weeks to months and are followed by periods of no headaches.    · Can last from 15 minutes to 3 hours.    · Occur at the same time each day, often at night.    · Occur several times a day.  CAUSES  The exact cause of cluster headaches is not known. Alcohol use may be associated with cluster headaches.  SIGNS AND SYMPTOMS   · Severe pain that begins in or around your eye or temple.    · One-sided head pain.    · Feeling sick to your stomach (nauseous).    · Sensitivity to light.    · Runny nose.    · Eye redness, tearing, and nasal stuffiness on the side of your head where you are experiencing pain.    · Sweaty, pale skin of the face.    · Droopy or swollen eyelid.    · Restlessness.  DIAGNOSIS   Cluster headaches are diagnosed based on symptoms and a physical exam. Your health care provider may order a CT scan or an MRI of your head or lab tests to see if your headaches are caused by other medical conditions.   TREATMENT   · Medicines for pain relief and to prevent recurrent attacks. Some people may need a combination of medicines.  · Oxygen for pain relief.    · Biofeedback programs to help reduce headache pain.    It may be helpful to keep a headache diary. This may help you find a trend for what is triggering your headaches. Your health care provider can develop a treatment plan.   HOME CARE INSTRUCTIONS   During cluster periods:   · Follow a regular sleep schedule. Do not vary the amount and time that you sleep from day to day. It is important to stay on the same schedule during a cluster period to help prevent headaches.    · Avoid alcohol.    · Stop smoking if you smoke.    SEEK MEDICAL CARE IF:  · You have any changes from your previous  cluster headaches either in intensity or frequency.    · You are not getting relief from medicines you are taking.    SEEK IMMEDIATE MEDICAL CARE IF:   · You faint.    · You have weakness or numbness, especially on one side of your body or face.    · You have double vision.    · You have nausea or vomiting that is not relieved within several hours.    · You cannot keep your balance or have difficulty talking or walking.    · You have neck pain or stiffness.    · You have a fever.  MAKE SURE YOU:  · Understand these instructions.    · Will watch your condition.    · Will get help right away if you are not doing well or get worse.     This information is not intended to replace advice given to you by your health care provider. Make sure you discuss any questions you have with your health care provider.     Document Released: 04/29/2005 Document Revised: 02/17/2013 Document Reviewed: 11/19/2012  Elsevier Interactive Patient Education ©2016 Elsevier Inc.

## 2015-10-01 NOTE — ED Provider Notes (Signed)
[facility] Emergency Department Provider Note  ____________________________________________  Time seen: Approximately 12:22 AM  I have reviewed the triage vital signs and the nursing notes.   HISTORY  Chief Complaint Headache    HPI Alexis Ellis is a 25 y.o. female with history of daily headaches that his been long-term.  She just got established with primary care doctor yesterday and did discuss the headaches with her, and the patient was prescribed some amitriptyline, but she has not yet filled the prescription.  She describes it is acute in onset and in the left frontal part of her head with associated eye pain.  Today she also had some clear rhinorrhea from the left nostril.  The pain radiates into her face.  She has no visual disturbances other than the eye pain.  Photophobia is present.  She describes the pain as severe and nothing makes it better.  She denies nausea, vomiting, diarrhea, chest pain, shortness of breath, abdominal pain, dysuria.   Past Medical History  Diagnosis Date  . ADD (attention deficit disorder)   . Frequent headaches   . Elevated blood pressure   . Chicken pox   . Elevated cholesterol   . Ectopic pregnancy   . Depression   . Frequent headaches   . History of kidney stones     Patient Active Problem List   Diagnosis Date Noted  . Tension headache 09/29/2015  . Midline low back pain without sciatica 09/29/2015  . HLD (hyperlipidemia) 09/29/2015  . Obesity (BMI 35.0-39.9 without comorbidity) ([place]) 09/29/2015  . ADD (attention deficit disorder) 11/07/2013  . Anxiety and depression 11/07/2013    Past Surgical History  Procedure Laterality Date  . Laparoscopy N/A 09/16/2012    Procedure: LAPAROSCOPY OPERATIVE;  Surgeon: Allena Katz, MD;  Location: [place];  Service: Gynecology;  Laterality: N/A;  Evacuation Hemoperitoneum  . Unilateral salpingectomy Right 09/16/2012    Procedure: UNILATERAL SALPINGECTOMY;   Surgeon: Allena Katz, MD;  Location: [facility];  Service: Gynecology;  Laterality: Right;  . Wisdom tooth extraction      Current Outpatient Rx  Name  Route  Sig  Dispense  Refill  . amitriptyline (ELAVIL) 25 MG tablet   Oral   Take 1 tablet (25 mg total) by mouth at bedtime as needed for sleep.   30 tablet   1   . amphetamine-dextroamphetamine (ADDERALL) 20 MG tablet      1 tablet 3 times daily      0   . clonazePAM (KLONOPIN) 1 MG tablet   Oral   Take 1 mg by mouth 2 (two) times daily as needed for anxiety.       0   . fluticasone (FLONASE) 50 MCG/ACT nasal spray   Each Nare   Place 2 sprays into both nostrils daily as needed.       0   . ibuprofen (ADVIL,MOTRIN) 200 MG tablet   Oral   Take 800 mg by mouth daily as needed.           Allergies Review of patient's allergies indicates no known allergies.  Family History  Problem Relation Age of Onset  . Hypertension Mother     Living  . Fibromyalgia Mother   . Diabetes Father 41    Deceased  . Heart attack Father   . Sudden death Father   . Bipolar disorder Maternal Grandmother   . Diabetes Maternal Grandfather   . Alcohol abuse Paternal Grandfather   .  Liver disease Paternal Grandfather   . Healthy Sister   . Hyperlipidemia Paternal Grandmother   . Kidney disease Paternal Grandmother   . Liver disease Paternal Grandmother   . Heart disease Paternal Grandmother     Social History Social History  Substance Use Topics  . Smoking status: Former Smoker -- 0.00 packs/day  . Smokeless tobacco: Never Used  . Alcohol Use: 0.0 oz/week    0 Standard drinks or equivalent per week     Comment: social    Review of Systems Constitutional: No fever/chills Eyes: No visual changes. ENT: No sore throat. Cardiovascular: Denies chest pain. Respiratory: Denies shortness of breath. Gastrointestinal: No abdominal pain.  No nausea, no vomiting.  No diarrhea.  No constipation. Genitourinary: Negative for  dysuria. Musculoskeletal: Negative for back pain. Skin: Negative for rash. Neurological: Negative for headaches, focal weakness or numbness.  10-point ROS otherwise negative.  ____________________________________________   PHYSICAL EXAM:  VITAL SIGNS: ED Triage Vitals  Enc Vitals Group     BP 09/30/15 2335 140/89 mmHg     Pulse Rate 09/30/15 2335 103     Resp 09/30/15 2335 20     Temp 09/30/15 2335 97.9 F (36.6 C)     Temp Source 09/30/15 2335 Oral     SpO2 09/30/15 2335 95 %     Weight 09/30/15 2335 220 lb (99.791 kg)     Height 09/30/15 2335 5\' 5"  (1.651 m)     Head Cir --      Peak Flow --      Pain Score 09/30/15 2341 10     Pain Loc --      Pain Edu? --      Excl. in [place]? --     Constitutional: Alert and oriented. Appears acutely uncomfortable. Eyes: Conjunctivae are normal. PERRL. EOMI. No papilledema on funduscopic exam. Head: Atraumatic. Nose: No congestion/rhinnorhea. Mouth/Throat: Mucous membranes are moist.  Oropharynx non-erythematous. Neck: No stridor.  No meningeal signs.   Cardiovascular: Normal rate, regular rhythm. Good peripheral circulation. Grossly normal heart sounds.   Respiratory: Normal respiratory effort.  No retractions. Lungs CTAB. Gastrointestinal: Soft and nontender. No distention.  Musculoskeletal: No lower extremity tenderness nor edema. No gross deformities of extremities. Neurologic:  Normal speech and language. No gross focal neurologic deficits are appreciated.  Skin:  Skin is warm, dry and intact. No rash noted. Psychiatric: Mood and affect are normal. Speech and behavior are normal.  ____________________________________________   LABS (all labs ordered are listed, but only abnormal results are displayed)  Labs Reviewed - No data to display ____________________________________________  EKG  None ____________________________________________  RADIOLOGY   No results  found.  ____________________________________________   PROCEDURES  Procedure(s) performed: None  Critical Care performed: No ____________________________________________   INITIAL IMPRESSION / ASSESSMENT AND PLAN / ED COURSE  Pertinent labs & imaging results that were available during my care of the patient were reviewed by me and considered in my medical decision making (see chart for details).  12:29 AM:  The patient's symptoms are suggestive more of a cluster headache and a migraine at this time.  I have put her on 15 L of oxygen and a nonrebreather and I will check on her and 15-30 minutes to see if this is helped alleviate her symptoms.  If not I will proceed with migraine treatment.  She has no evidence of papilledema and pain history makes me suspect pseudotumor cerebri.  Similarly there is no indication for a head CT at this time and  no suggestion of an intracranial hemorrhage.  ----------------------------------------- 12:58 AM on 10/01/2015 -----------------------------------------  The patient reports her headache is almost completely gone.  She declines subcutaneous sumatriptan at this time.  I will leave her on oxygen for a little while longer, but I had my usual and customary cluster headache discussion with the patient.  She is going to follow-up as an outpatient.  ____________________________________________  FINAL CLINICAL IMPRESSION(S) / ED DIAGNOSES  Final diagnoses:  Chronic cluster headache, not intractable     MEDICATIONS GIVEN DURING THIS VISIT:  Medications - No data to display   NEW OUTPATIENT MEDICATIONS STARTED DURING THIS VISIT:  Discharge Medication List as of 10/01/2015  1:26 AM        Note:  This document was prepared using Dragon voice recognition software and may include unintentional dictation errors.   Hinda Kehr, MD 10/01/15 [phone]

## 2015-10-05 ENCOUNTER — Ambulatory Visit (INDEPENDENT_AMBULATORY_CARE_PROVIDER_SITE_OTHER): Payer: BLUE CROSS/BLUE SHIELD | Admitting: Internal Medicine

## 2015-10-05 ENCOUNTER — Encounter: Payer: Self-pay | Admitting: Internal Medicine

## 2015-10-05 VITALS — BP 124/80 | HR 94 | Temp 97.9°F | Wt 222.0 lb

## 2015-10-05 DIAGNOSIS — G44029 Chronic cluster headache, not intractable: Secondary | ICD-10-CM

## 2015-10-05 NOTE — Patient Instructions (Signed)
Cluster Headache  Cluster headaches are recognized by their pattern of deep, intense head pain. They normally occur on one side of your head, but they may "switch sides" in subsequent episodes. Typically, cluster headaches:   · Are severe in nature.    · Occur repeatedly over weeks to months and are followed by periods of no headaches.    · Can last from 15 minutes to 3 hours.    · Occur at the same time each day, often at night.    · Occur several times a day.  CAUSES  The exact cause of cluster headaches is not known. Alcohol use may be associated with cluster headaches.  SIGNS AND SYMPTOMS   · Severe pain that begins in or around your eye or temple.    · One-sided head pain.    · Feeling sick to your stomach (nauseous).    · Sensitivity to light.    · Runny nose.    · Eye redness, tearing, and nasal stuffiness on the side of your head where you are experiencing pain.    · Sweaty, pale skin of the face.    · Droopy or swollen eyelid.    · Restlessness.  DIAGNOSIS   Cluster headaches are diagnosed based on symptoms and a physical exam. Your health care provider may order a CT scan or an MRI of your head or lab tests to see if your headaches are caused by other medical conditions.   TREATMENT   · Medicines for pain relief and to prevent recurrent attacks. Some people may need a combination of medicines.  · Oxygen for pain relief.    · Biofeedback programs to help reduce headache pain.    It may be helpful to keep a headache diary. This may help you find a trend for what is triggering your headaches. Your health care provider can develop a treatment plan.   HOME CARE INSTRUCTIONS   During cluster periods:   · Follow a regular sleep schedule. Do not vary the amount and time that you sleep from day to day. It is important to stay on the same schedule during a cluster period to help prevent headaches.    · Avoid alcohol.    · Stop smoking if you smoke.    SEEK MEDICAL CARE IF:  · You have any changes from your previous  cluster headaches either in intensity or frequency.    · You are not getting relief from medicines you are taking.    SEEK IMMEDIATE MEDICAL CARE IF:   · You faint.    · You have weakness or numbness, especially on one side of your body or face.    · You have double vision.    · You have nausea or vomiting that is not relieved within several hours.    · You cannot keep your balance or have difficulty talking or walking.    · You have neck pain or stiffness.    · You have a fever.  MAKE SURE YOU:  · Understand these instructions.    · Will watch your condition.    · Will get help right away if you are not doing well or get worse.     This information is not intended to replace advice given to you by your health care provider. Make sure you discuss any questions you have with your health care provider.     Document Released: 04/29/2005 Document Revised: 02/17/2013 Document Reviewed: 11/19/2012  Elsevier Interactive Patient Education ©2016 [employer]

## 2015-10-05 NOTE — Progress Notes (Signed)
Subjective:    Patient ID: Alexis Ellis, female    DOB: February 03, 1991, 25 y.o.   MRN: [id number]  HPI  Pt presents to the clinic today for ER follow up for headaches. She was c/o sharp, stabbing left side headaches, with eye pain and runny nose on the left side. They felt like she was having a cluster headache, so they provider her with oxygen, which provided complete resolution of symptoms. They did not feel like she needed a CT scan of her head at that time. I saw her 5/18 for the same. I prescribed daily Amitriptyline but she reports she never picked that up. She was told at the ER to come back to her PCP to see if oxygen could be prescribed for home use.  Review of Systems      Past Medical History  Diagnosis Date  . ADD (attention deficit disorder)   . Frequent headaches   . Elevated blood pressure   . Chicken pox   . Elevated cholesterol   . Ectopic pregnancy   . Depression   . Frequent headaches   . History of kidney stones     Current Outpatient Prescriptions  Medication Sig Dispense Refill  . amitriptyline (ELAVIL) 25 MG tablet Take 1 tablet (25 mg total) by mouth at bedtime as needed for sleep. 30 tablet 1  . amphetamine-dextroamphetamine (ADDERALL) 20 MG tablet 1 tablet 3 times daily  0  . clonazePAM (KLONOPIN) 1 MG tablet Take 1 mg by mouth 2 (two) times daily as needed for anxiety.   0  . fluticasone (FLONASE) 50 MCG/ACT nasal spray Place 2 sprays into both nostrils daily as needed.   0  . ibuprofen (ADVIL,MOTRIN) 200 MG tablet Take 800 mg by mouth daily as needed.     No current facility-administered medications for this visit.    No Known Allergies  Family History  Problem Relation Age of Onset  . Hypertension Mother     Living  . Fibromyalgia Mother   . Diabetes Father 87    Deceased  . Heart attack Father   . Sudden death Father   . Bipolar disorder Maternal Grandmother   . Diabetes Maternal Grandfather   . Alcohol abuse Paternal Grandfather   .  Liver disease Paternal Grandfather   . Healthy Sister   . Hyperlipidemia Paternal Grandmother   . Kidney disease Paternal Grandmother   . Liver disease Paternal Grandmother   . Heart disease Paternal Grandmother     Social History   Social History  . Marital Status: Single    Spouse Name: N/A  . Number of Children: N/A  . Years of Education: N/A   Occupational History  . Not on file.   Social History Main Topics  . Smoking status: Former Smoker -- 0.00 packs/day  . Smokeless tobacco: Never Used  . Alcohol Use: 0.0 oz/week    0 Standard drinks or equivalent per week     Comment: social  . Drug Use: No  . Sexual Activity: Yes    Birth Control/ Protection: None   Other Topics Concern  . Not on file   Social History Narrative     Constitutional: Pt reports headaches. Denies fever, malaise, fatigue, or abrupt weight changes.  HEENT: Denies eye pain, eye redness, ear pain, ringing in the ears, wax buildup, runny nose, nasal congestion, bloody nose, or sore throat. Respiratory: Denies difficulty breathing, shortness of breath, cough or sputum production.   Cardiovascular: Denies chest pain, chest tightness,  palpitations or swelling in the hands or feet.  Neurological: Denies dizziness, difficulty with memory, difficulty with speech or problems with balance and coordination.    No other specific complaints in a complete review of systems (except as listed in HPI above).  Objective:   Physical Exam   BP 124/80 mmHg  Pulse 94  Temp(Src) 97.9 F (36.6 C) (Oral)  Wt 222 lb (100.699 kg)  SpO2 97%  LMP 09/26/2015 Wt Readings from Last 3 Encounters:  10/05/15 222 lb (100.699 kg)  09/30/15 220 lb (99.791 kg)  09/28/15 228 lb (103.42 kg)    General: Appears her stated age, obese in NAD. HEENT: Head: normal shape and size; Eyes: sclera white, no icterus, conjunctiva pink, PERRLA and EOMs intact;  Neurological: Alert and oriented.  Coordination normal.  Psychiatric: She  is very tearful and noticeably upset today.  BMET    Component Value Date/Time   NA 143 09/21/2015 1230   K 4.6 09/21/2015 1230   CL 107 09/21/2015 1230   CO2 26 09/21/2015 1230   GLUCOSE 80 09/21/2015 1230   BUN 15 09/21/2015 1230   CREATININE 0.78 09/21/2015 1230   CREATININE 1.00 06/05/2015 2228   CALCIUM 9.0 09/21/2015 1230   GFRNONAA >60 09/21/2015 1230   GFRAA >60 09/21/2015 1230    Lipid Panel     Component Value Date/Time   CHOL 224* 11/01/2013 1051   TRIG 141 11/01/2013 1051   HDL 32* 11/01/2013 1051   CHOLHDL 7.0 11/01/2013 1051   VLDL 28 11/01/2013 1051   LDLCALC 164* 11/01/2013 1051    CBC    Component Value Date/Time   WBC 10.8* 09/19/2015 1135   WBC 11.3* 06/05/2015 2226   RBC 5.33* 09/19/2015 1135   RBC 5.33 06/05/2015 2226   HGB 16.3* 09/19/2015 1135   HGB 15.8 06/05/2015 2226   HCT 45.3 09/19/2015 1135   HCT 45.5 06/05/2015 2226   PLT 296 09/19/2015 1135   MCV 85.0 09/19/2015 1135   MCV 85.4 06/05/2015 2226   MCH 30.6 09/19/2015 1135   MCH 29.7 06/05/2015 2226   MCHC 36.0 09/19/2015 1135   MCHC 34.8 06/05/2015 2226   RDW 12.3 09/19/2015 1135   LYMPHSABS 2.5 11/01/2013 1051   MONOABS 0.6 11/01/2013 1051   EOSABS 0.2 11/01/2013 1051   BASOSABS 0.0 11/01/2013 1051    Hgb A1C Lab Results  Component Value Date   HGBA1C 5.1 11/01/2013        Assessment & Plan:   ER follow up for chronic cluster headaches:  ER notes reviewed Advised her that insurance would likely not pay for home oxygen for her, this made her really upset because she feels like now she wasted her time and money on the visit Oxygen therapy ordered through [facility] If they will not cover, discussed treating with Verapamil and Imitrex- she wants to research this  RTC as needed or if symptoms persist or worsen

## 2015-10-05 NOTE — Progress Notes (Signed)
Pre visit review using our clinic review tool, if applicable. No additional management support is needed unless otherwise documented below in the visit note. 

## 2015-10-06 NOTE — Addendum Note (Signed)
Addended by: Lurlean Nanny on: 10/06/2015 02:26 PM   Modules accepted: Orders

## 2015-10-06 NOTE — Addendum Note (Signed)
Addended by: Lurlean Nanny on: 10/06/2015 01:52 PM   Modules accepted: Orders

## 2015-10-24 ENCOUNTER — Telehealth: Payer: Self-pay

## 2015-10-24 NOTE — Telephone Encounter (Signed)
Alexis Ellis with [employer] left v/m; pt had contacted [facility] about Oxygen device not approved. Illyla could not find what device she was referring to so could not help pt repeal the decision. Alexis Ellis request cb.

## 2015-10-25 ENCOUNTER — Encounter: Payer: Self-pay | Admitting: Internal Medicine

## 2015-10-25 NOTE — Telephone Encounter (Signed)
Sent msg to pt via mychart---it is through [facility] and they attempted to call pt with no return call

## 2015-11-17 ENCOUNTER — Ambulatory Visit (INDEPENDENT_AMBULATORY_CARE_PROVIDER_SITE_OTHER): Payer: BLUE CROSS/BLUE SHIELD | Admitting: Family Medicine

## 2015-11-17 VITALS — BP 110/70 | HR 82 | Temp 98.3°F | Ht 64.5 in | Wt 218.0 lb

## 2015-11-17 DIAGNOSIS — R197 Diarrhea, unspecified: Secondary | ICD-10-CM | POA: Diagnosis not present

## 2015-11-17 MED ORDER — ONDANSETRON 8 MG PO TBDP: 8.0000 mg | ORAL_TABLET | Freq: Three times a day (TID) | ORAL | Status: DC | PRN

## 2015-11-17 NOTE — Progress Notes (Signed)
Pre visit review using our clinic review tool, if applicable. No additional management support is needed unless otherwise documented below in the visit note. 

## 2015-11-17 NOTE — Patient Instructions (Signed)

## 2015-11-17 NOTE — Progress Notes (Signed)
   Subjective:    Patient ID: Alexis Ellis, female    DOB: Jan 20, 1991, 25 y.o.   MRN: [id number]  HPI Acute visit for 2 day history of nausea without vomiting and diarrhea She's had up to 10 stools per day. These are mostly watery. Nonbloody. Decreased appetite. No recent travels. No recent antibiotics. No recent change of medications. No associated fevers or chills. Mild lower abdominal cramps intermittently. She's keeping down Gatorade and has been eating some foods  Past Medical History  Diagnosis Date  . ADD (attention deficit disorder)   . Frequent headaches   . Elevated blood pressure   . Chicken pox   . Elevated cholesterol   . Ectopic pregnancy   . Depression   . Frequent headaches   . History of kidney stones    Past Surgical History  Procedure Laterality Date  . Laparoscopy N/A 09/16/2012    Procedure: LAPAROSCOPY OPERATIVE;  Surgeon: Allena Katz, MD;  Location: [facility];  Service: Gynecology;  Laterality: N/A;  Evacuation Hemoperitoneum  . Unilateral salpingectomy Right 09/16/2012    Procedure: UNILATERAL SALPINGECTOMY;  Surgeon: Allena Katz, MD;  Location: [facility];  Service: Gynecology;  Laterality: Right;  . Wisdom tooth extraction      reports that she has quit smoking. She has never used smokeless tobacco. She reports that she drinks alcohol. She reports that she does not use illicit drugs. family history includes Alcohol abuse in her paternal grandfather; Bipolar disorder in her maternal grandmother; Diabetes in her maternal grandfather; Diabetes (age of onset: 10) in her father; Fibromyalgia in her mother; Healthy in her sister; Heart attack in her father; Heart disease in her paternal grandmother; Hyperlipidemia in her paternal grandmother; Hypertension in her mother; Kidney disease in her paternal grandmother; Liver disease in her paternal grandfather and paternal grandmother; Sudden death in her father. No Known Allergies]    Review of Systems    Constitutional: Negative for fever and chills.  Respiratory: Negative for shortness of breath.   Gastrointestinal: Positive for nausea and diarrhea. Negative for vomiting, blood in stool and abdominal distention.       Objective:   Physical Exam  Constitutional: She appears well-developed and well-nourished. No distress.  HENT:  Mouth/Throat: Oropharynx is clear and moist.  Cardiovascular: Normal rate and regular rhythm.   Pulmonary/Chest: Effort normal and breath sounds normal. No respiratory distress. She has no wheezes. She has no rales.  Abdominal: Soft. Bowel sounds are normal. She exhibits no distension and no mass. There is no tenderness. There is no rebound and no guarding.          Assessment & Plan:  Diarrhea. Suspect viral illness. She does not appear dehydrated. Prescription for Zofran 8 mg every 8 hours as needed for nausea. Dietary factors discussed. She will consider over-the-counter Imodium as needed. Follow-up for any fever, bloody stools, or persistent diarrhea.  Eulas Post MD [facility]

## 2015-11-20 ENCOUNTER — Ambulatory Visit: Payer: BLUE CROSS/BLUE SHIELD | Admitting: Primary Care

## 2015-11-20 DIAGNOSIS — Z0289 Encounter for other administrative examinations: Secondary | ICD-10-CM

## 2015-11-21 ENCOUNTER — Telehealth: Payer: Self-pay | Admitting: Internal Medicine

## 2015-11-21 NOTE — Telephone Encounter (Signed)
No immediate follow up necessary

## 2015-11-21 NOTE — Telephone Encounter (Signed)
Patient did not come in for their appointment today for throwing up.  Please let me know if patient needs to be contacted immediately for follow up or no follow up needed.

## 2015-11-22 ENCOUNTER — Encounter: Payer: Self-pay | Admitting: Internal Medicine

## 2015-11-22 ENCOUNTER — Ambulatory Visit (INDEPENDENT_AMBULATORY_CARE_PROVIDER_SITE_OTHER): Payer: BLUE CROSS/BLUE SHIELD | Admitting: Internal Medicine

## 2015-11-22 VITALS — BP 114/80 | HR 76 | Temp 98.5°F | Wt 219.5 lb

## 2015-11-22 DIAGNOSIS — R1031 Right lower quadrant pain: Secondary | ICD-10-CM | POA: Diagnosis not present

## 2015-11-22 DIAGNOSIS — R112 Nausea with vomiting, unspecified: Secondary | ICD-10-CM

## 2015-11-22 DIAGNOSIS — R197 Diarrhea, unspecified: Secondary | ICD-10-CM | POA: Diagnosis not present

## 2015-11-22 LAB — COMPREHENSIVE METABOLIC PANEL
ALT: 32 U/L (ref 0–35)
AST: 19 U/L (ref 0–37)
Albumin: 4.4 g/dL (ref 3.5–5.2)
Alkaline Phosphatase: 71 U/L (ref 39–117)
BUN: 12 mg/dL (ref 6–23)
CO2: 28 mEq/L (ref 19–32)
Calcium: 9.9 mg/dL (ref 8.4–10.5)
Chloride: 98 mEq/L (ref 96–112)
Creatinine, Ser: 0.75 mg/dL (ref 0.40–1.20)
GFR: 98.54 mL/min (ref >60.00)
Glucose, Bld: 105 mg/dL — ABNORMAL HIGH (ref 70–99)
Potassium: 3.7 mEq/L (ref 3.5–5.1)
Sodium: 140 mEq/L (ref 135–145)
Total Bilirubin: 0.7 mg/dL (ref 0.2–1.2)
Total Protein: 7.2 g/dL (ref 6.0–8.3)

## 2015-11-22 LAB — CBC
HCT: 46.8 % — ABNORMAL HIGH (ref 36.0–46.0)
Hemoglobin: 16.2 g/dL — ABNORMAL HIGH (ref 12.0–15.0)
MCHC: 34.6 g/dL (ref 30.0–36.0)
MCV: 86.9 fl (ref 78.0–100.0)
Platelets: 306 10*3/uL (ref 150.0–400.0)
RBC: 5.39 Mil/uL — ABNORMAL HIGH (ref 3.87–5.11)
RDW: 12.3 % (ref 11.5–15.5)
WBC: 11.5 10*3/uL — ABNORMAL HIGH (ref 4.0–10.5)

## 2015-11-22 MED ORDER — PROMETHAZINE HCL 12.5 MG PO TABS: 12.5000 mg | ORAL_TABLET | Freq: Four times a day (QID) | ORAL | Status: DC | PRN

## 2015-11-22 MED ORDER — DIPHENOXYLATE-ATROPINE 2.5-0.025 MG PO TABS: 1.0000 | ORAL_TABLET | Freq: Four times a day (QID) | ORAL | Status: DC | PRN

## 2015-11-22 NOTE — Progress Notes (Signed)
Subjective:    Patient ID: Alexis Ellis, female    DOB: [birth year], 25 y.o.   MRN: [id number]  HPI  Pt presents to the clinic today to follow-up nausea, vomiting and diarrhea. This started 11/15/15. She saw Dr. Elease Hashimoto for the same. She was diagnosed with viral illness, given Zofran and advised to take Imodium OTC. She reports her symptoms have not improved. She is still nauseated and vomiting multiple time per day. She is having abdominal pain and 4-5 loose watery stools per day. She denies blood in her vomit or in her stool. She denies changes in diet or medications. She denies recent travel or antibiotic use. She has not had sick contacts that she is aware of. She has not tried anything additional OTC.  Review of Systems      Past Medical History  Diagnosis Date  . ADD (attention deficit disorder)   . Frequent headaches   . Elevated blood pressure   . Chicken pox   . Elevated cholesterol   . Ectopic pregnancy   . Depression   . Frequent headaches   . History of kidney stones     Current Outpatient Prescriptions  Medication Sig Dispense Refill  . amphetamine-dextroamphetamine (ADDERALL) 20 MG tablet 1 tablet 3 times daily  0  . clonazePAM (KLONOPIN) 1 MG tablet Take 1 mg by mouth 2 (two) times daily as needed for anxiety.   0  . fluticasone (FLONASE) 50 MCG/ACT nasal spray Place 2 sprays into both nostrils daily as needed.   0  . ibuprofen (ADVIL,MOTRIN) 200 MG tablet Take 800 mg by mouth daily as needed.    . diphenoxylate-atropine (LOMOTIL) 2.5-0.025 MG tablet Take 1 tablet by mouth 4 (four) times daily as needed for diarrhea or loose stools. 30 tablet 0  . promethazine (PHENERGAN) 12.5 MG tablet Take 1 tablet (12.5 mg total) by mouth every 6 (six) hours as needed for nausea or vomiting. 30 tablet 0   No current facility-administered medications for this visit.    No Known Allergies  Family History  Problem Relation Age of Onset  . Hypertension Mother     Living    . Fibromyalgia Mother   . Diabetes Father 13    Deceased  . Heart attack Father   . Sudden death Father   . Bipolar disorder Maternal Grandmother   . Diabetes Maternal Grandfather   . Alcohol abuse Paternal Grandfather   . Liver disease Paternal Grandfather   . Healthy Sister   . Hyperlipidemia Paternal Grandmother   . Kidney disease Paternal Grandmother   . Liver disease Paternal Grandmother   . Heart disease Paternal Grandmother     Social History   Social History  . Marital Status: Single    Spouse Name: N/A  . Number of Children: N/A  . Years of Education: N/A   Occupational History  . Not on file.   Social History Main Topics  . Smoking status: Former Smoker -- 0.00 packs/day  . Smokeless tobacco: Never Used  . Alcohol Use: 0.0 oz/week    0 Standard drinks or equivalent per week     Comment: social  . Drug Use: No  . Sexual Activity: Yes    Birth Control/ Protection: None   Other Topics Concern  . Not on file   Social History Narrative     Constitutional: Pt reports fever. Denies malaise, fatigue, headache or abrupt weight changes.  Respiratory: Denies difficulty breathing, shortness of breath, cough or sputum production.  Cardiovascular: Denies chest pain, chest tightness, palpitations or swelling in the hands or feet.  Gastrointestinal: Pt reports nausea, vomiting, diarrhea and abdominal pain. Denies constipation, or blood in the stool.  GU: Denies urgency, frequency, pain with urination, burning sensation, blood in urine, odor or discharge.  No other specific complaints in a complete review of systems (except as listed in HPI above).  Objective:   Physical Exam   BP 114/80 mmHg  Pulse 76  Temp(Src) 98.5 F (36.9 C) (Oral)  Wt 219 lb 8 oz (99.565 kg)  SpO2 98% Wt Readings from Last 3 Encounters:  11/22/15 219 lb 8 oz (99.565 kg)  11/17/15 218 lb (98.884 kg)  10/05/15 222 lb (100.699 kg)    General: Appears her stated age, ill appearing in  NAD. Cardiovascular: Normal rate and rhythm. S1,S2 noted.  No murmur, rubs or gallops noted.  Pulmonary/Chest: Normal effort and positive vesicular breath sounds. No respiratory distress. No wheezes, rales or ronchi noted.  Abdomen: Soft and tender in the RLQ. Hypoactive bowel sounds. No distention or masses noted.  Neurological: Alert and oriented.   BMET    Component Value Date/Time   NA 143 09/21/2015 1230   K 4.6 09/21/2015 1230   CL 107 09/21/2015 1230   CO2 26 09/21/2015 1230   GLUCOSE 80 09/21/2015 1230   BUN 15 09/21/2015 1230   CREATININE 0.78 09/21/2015 1230   CREATININE 1.00 06/05/2015 2228   CALCIUM 9.0 09/21/2015 1230   GFRNONAA >60 09/21/2015 1230   GFRAA >60 09/21/2015 1230    Lipid Panel     Component Value Date/Time   CHOL 224* 11/01/2013 1051   TRIG 141 11/01/2013 1051   HDL 32* 11/01/2013 1051   CHOLHDL 7.0 11/01/2013 1051   VLDL 28 11/01/2013 1051   LDLCALC 164* 11/01/2013 1051    CBC    Component Value Date/Time   WBC 10.8* 09/19/2015 1135   WBC 11.3* 06/05/2015 2226   RBC 5.33* 09/19/2015 1135   RBC 5.33 06/05/2015 2226   HGB 16.3* 09/19/2015 1135   HGB 15.8 06/05/2015 2226   HCT 45.3 09/19/2015 1135   HCT 45.5 06/05/2015 2226   PLT 296 09/19/2015 1135   MCV 85.0 09/19/2015 1135   MCV 85.4 06/05/2015 2226   MCH 30.6 09/19/2015 1135   MCH 29.7 06/05/2015 2226   MCHC 36.0 09/19/2015 1135   MCHC 34.8 06/05/2015 2226   RDW 12.3 09/19/2015 1135   LYMPHSABS 2.5 11/01/2013 1051   MONOABS 0.6 11/01/2013 1051   EOSABS 0.2 11/01/2013 1051   BASOSABS 0.0 11/01/2013 1051    Hgb A1C Lab Results  Component Value Date   HGBA1C 5.1 11/01/2013        Assessment & Plan:   Nausea, vomiting, diarrhea, RLQ abdominal pain:  Could be a mild appendicitis Will check CBC, CMET today Will check C diff and stool culture If CBC elevated, consider CT abdomen to r/o appendicitis eRx for Pheneragn 12. 5 mg TID prn eRx for Lomitil QID as needed  Will  follow up after labs, to ER if worse

## 2015-11-22 NOTE — Progress Notes (Signed)
Pre visit review using our clinic review tool, if applicable. No additional management support is needed unless otherwise documented below in the visit note. 

## 2015-11-22 NOTE — Patient Instructions (Signed)

## 2015-11-23 ENCOUNTER — Other Ambulatory Visit: Payer: Self-pay | Admitting: Internal Medicine

## 2015-11-23 ENCOUNTER — Telehealth: Payer: Self-pay | Admitting: Internal Medicine

## 2015-11-23 DIAGNOSIS — R112 Nausea with vomiting, unspecified: Secondary | ICD-10-CM

## 2015-11-23 DIAGNOSIS — R197 Diarrhea, unspecified: Secondary | ICD-10-CM

## 2015-11-23 DIAGNOSIS — R1031 Right lower quadrant pain: Secondary | ICD-10-CM

## 2015-11-23 NOTE — Telephone Encounter (Signed)
Patient returned Alexis Ellis's call about her lab work.

## 2015-11-24 ENCOUNTER — Ambulatory Visit (INDEPENDENT_AMBULATORY_CARE_PROVIDER_SITE_OTHER)
Admission: RE | Admit: 2015-11-24 | Discharge: 2015-11-24 | Disposition: A | Discharge status: Home / Self Care | Payer: BLUE CROSS/BLUE SHIELD | Source: Ambulatory Visit | Attending: Internal Medicine | Admitting: Internal Medicine

## 2015-11-24 DIAGNOSIS — R197 Diarrhea, unspecified: Secondary | ICD-10-CM

## 2015-11-24 DIAGNOSIS — R1031 Right lower quadrant pain: Secondary | ICD-10-CM

## 2015-11-24 DIAGNOSIS — R112 Nausea with vomiting, unspecified: Secondary | ICD-10-CM | POA: Diagnosis not present

## 2015-11-24 MED ORDER — IOPAMIDOL (ISOVUE-300) INJECTION 61%
100.0000 mL | Freq: Once | INTRAVENOUS | Status: AC | PRN
  Administered 2015-11-24: 100 mL via INTRAVENOUS

## 2015-11-28 NOTE — Addendum Note (Signed)
Addended by: Ellamae Sia on: 11/28/2015 08:15 AM   Modules accepted: Orders

## 2015-11-29 ENCOUNTER — Encounter: Payer: Self-pay | Admitting: Internal Medicine

## 2015-12-12 ENCOUNTER — Emergency Department
Admission: EM | Admit: 2015-12-12 | Discharge: 2015-12-12 | Disposition: A | Discharge status: Home / Self Care | Payer: BLUE CROSS/BLUE SHIELD | Attending: Student | Admitting: Student

## 2015-12-12 ENCOUNTER — Encounter: Payer: Self-pay | Admitting: Emergency Medicine

## 2015-12-12 DIAGNOSIS — Z79899 Other long term (current) drug therapy: Secondary | ICD-10-CM | POA: Insufficient documentation

## 2015-12-12 DIAGNOSIS — J020 Streptococcal pharyngitis: Secondary | ICD-10-CM | POA: Insufficient documentation

## 2015-12-12 DIAGNOSIS — Z87891 Personal history of nicotine dependence: Secondary | ICD-10-CM | POA: Insufficient documentation

## 2015-12-12 LAB — POCT RAPID STREP A: Streptococcus, Group A Screen (Direct): NEGATIVE

## 2015-12-12 MED ORDER — AMOXICILLIN 500 MG PO TABS: 500.0000 mg | ORAL_TABLET | Freq: Three times a day (TID) | ORAL | 0 refills | Status: DC

## 2015-12-12 MED ORDER — ACETAMINOPHEN-CODEINE #3 300-30 MG PO TABS: 1.0000 | ORAL_TABLET | ORAL | 0 refills | Status: DC | PRN

## 2015-12-12 NOTE — ED Provider Notes (Signed)
[facility] Emergency Department Provider Note  ____________________________________________  Time seen: Approximately 7:17 PM  I have reviewed the triage vital signs and the nursing notes.   HISTORY  Chief Complaint Sore Throat    HPI Alexis Ellis is a 25 y.o. female who presents to the emergency department for evaluation of sore throat. Symptoms started 4 days ago.  Past Medical History:  Diagnosis Date  . ADD (attention deficit disorder)   . Chicken pox   . Depression   . Ectopic pregnancy   . Elevated blood pressure   . Elevated cholesterol   . Frequent headaches   . Frequent headaches   . History of kidney stones     Patient Active Problem List   Diagnosis Date Noted  . Tension headache 09/29/2015  . Midline low back pain without sciatica 09/29/2015  . HLD (hyperlipidemia) 09/29/2015  . Obesity (BMI 35.0-39.9 without comorbidity) ([place]) 09/29/2015  . ADD (attention deficit disorder) 11/07/2013  . Anxiety and depression 11/07/2013    Past Surgical History:  Procedure Laterality Date  . LAPAROSCOPY N/A 09/16/2012   Procedure: LAPAROSCOPY OPERATIVE;  Surgeon: Allena Katz, MD;  Location: [facility];  Service: Gynecology;  Laterality: N/A;  Evacuation Hemoperitoneum  . UNILATERAL SALPINGECTOMY Right 09/16/2012   Procedure: UNILATERAL SALPINGECTOMY;  Surgeon: Allena Katz, MD;  Location: [facility];  Service: Gynecology;  Laterality: Right;  . WISDOM TOOTH EXTRACTION      Current Outpatient Rx  . Order #: [id number] Class: Print  . Order #: [id number] Class: Print  . Order #: [id number] Class: Historical Med  . Order #: [id number] Class: Historical Med  . Order #: [id number] Class: Print  . Order #: [id number] Class: Historical Med  . Order #: [id number] Class: Historical Med  . Order #: [id number] Class: Normal    Allergies Review of patient's allergies indicates no known allergies.  Family History  Problem Relation Age of Onset  .  Hypertension Mother     Living  . Fibromyalgia Mother   . Diabetes Father 53    Deceased  . Heart attack Father   . Sudden death Father   . Bipolar disorder Maternal Grandmother   . Diabetes Maternal Grandfather   . Alcohol abuse Paternal Grandfather   . Liver disease Paternal Grandfather   . Hyperlipidemia Paternal Grandmother   . Kidney disease Paternal Grandmother   . Liver disease Paternal Grandmother   . Heart disease Paternal Grandmother   . Healthy Sister     Social History Social History  Substance Use Topics  . Smoking status: Former Smoker    Packs/day: 0.00  . Smokeless tobacco: Never Used  . Alcohol use 0.0 oz/week     Comment: social    Review of Systems Constitutional: Negative for fever. Eyes: No visual changes. ENT: Positive for sore throat; negative for difficulty swallowing. Respiratory: Denies shortness of breath. Gastrointestinal: No abdominal pain.  No nausea, no vomiting.  No diarrhea. Genitourinary: Negative for dysuria. Musculoskeletal: Negative for generalized body aches. Skin: Negative for rash. Neurological: Negative for headaches, focal weakness or numbness.  ____________________________________________   PHYSICAL EXAM:  VITAL SIGNS: ED Triage Vitals [12/12/15 1849]  Enc Vitals Group     BP (!) 146/83     Pulse Rate 93     Resp 16     Temp 98.7 F (37.1 C)     Temp Source Oral     SpO2 96 %     Weight 208 lb (94.3 kg)     Height 5'  5" (1.651 m)     Head Circumference      Peak Flow      Pain Score      Pain Loc      Pain Edu?      Excl. in [place]?    Constitutional: Alert and oriented. Well appearing and in no acute distress. Eyes: Conjunctivae are normal. PERRL. EOMI. Head: Atraumatic. Nose: No congestion/rhinnorhea. Mouth/Throat: Mucous membranes are moist.  Oropharynx Erythematous, tonsils 2+ with extensive exudate. Neck: No stridor.  Lymphatic: Lymphadenopathy: Submandibular nodes palpable and tender Cardiovascular:  Normal rate, regular rhythm. Good peripheral circulation. Respiratory: Normal respiratory effort. Lungs CTAB. Gastrointestinal: Soft and nontender. Musculoskeletal: No lower extremity tenderness nor edema.   Neurologic:  Normal speech and language. No gross focal neurologic deficits are appreciated. Speech is normal. No gait instability. Skin:  Skin is warm, dry and intact. No rash noted Psychiatric: Mood and affect are normal. Speech and behavior are normal.  ____________________________________________   LABS (all labs ordered are listed, but only abnormal results are displayed)  Labs Reviewed  POCT RAPID STREP A   ____________________________________________  EKG   ____________________________________________  RADIOLOGY  Not indicated ____________________________________________   PROCEDURES  Procedure(s) performed: None  Critical Care performed: No  ____________________________________________   INITIAL IMPRESSION / ASSESSMENT AND PLAN / ED COURSE  Pertinent labs & imaging results that were available during my care of the patient were reviewed by me and considered in my medical decision making (see chart for details).  Patient was written prescriptions for amoxicillin and Tylenol 3. She was instructed to follow-up with the primary care provider for symptoms that are not improving over the next 2 days. She was instructed to return to the emergency department for symptoms that change or worsen if she is unable see her primary care provider. ____________________________________________   FINAL CLINICAL IMPRESSION(S) / ED DIAGNOSES  Final diagnoses:  Strep throat    Note:  This document was prepared using Dragon voice recognition software and may include unintentional dictation errors.    Victorino Dike, FNP 12/12/15 2158    Joanne Gavel, MD 12/13/15 [phone]

## 2015-12-12 NOTE — ED Triage Notes (Signed)
Reports sore throat x 4 days.  No resp distress

## 2016-06-13 ENCOUNTER — Encounter: Payer: Self-pay | Admitting: Internal Medicine

## 2016-06-13 ENCOUNTER — Ambulatory Visit (INDEPENDENT_AMBULATORY_CARE_PROVIDER_SITE_OTHER): Payer: BLUE CROSS/BLUE SHIELD | Admitting: Internal Medicine

## 2016-06-13 VITALS — BP 120/82 | HR 77 | Temp 98.1°F | Wt 229.0 lb

## 2016-06-13 DIAGNOSIS — Z205 Contact with and (suspected) exposure to viral hepatitis: Secondary | ICD-10-CM | POA: Diagnosis not present

## 2016-06-13 DIAGNOSIS — J069 Acute upper respiratory infection, unspecified: Secondary | ICD-10-CM | POA: Diagnosis not present

## 2016-06-13 DIAGNOSIS — B9789 Other viral agents as the cause of diseases classified elsewhere: Secondary | ICD-10-CM

## 2016-06-13 MED ORDER — HYDROCODONE-HOMATROPINE 5-1.5 MG/5ML PO SYRP: 5.0000 mL | ORAL_SOLUTION | Freq: Three times a day (TID) | ORAL | 0 refills | Status: DC | PRN

## 2016-06-13 NOTE — Progress Notes (Signed)
HPI  Pt presents to the clinic today with c/o headache, runny nose, ear pressure and cough. This started 3-4 days ago. She is blowing clear mucous out of her nose. She denies ear pain or decreased hearing. The cough is non productive. She ran a fever up to 101.0, but denies chills or body aches. She has tried Copywriter, advertising without any relief. She has no history of allergies or breathing problems. She has had sick contacts. She did not get her flu shot.  She is also requesting to be screen for Hep C. She just found out that her fiance tested positive for Hep C and they have been sexually active x 5 years.  Review of Systems        Past Medical History:  Diagnosis Date  . ADD (attention deficit disorder)   . Chicken pox   . Depression   . Ectopic pregnancy   . Elevated blood pressure   . Elevated cholesterol   . Frequent headaches   . Frequent headaches   . History of kidney stones     Family History  Problem Relation Age of Onset  . Hypertension Mother     Living  . Fibromyalgia Mother   . Diabetes Father 46    Deceased  . Heart attack Father   . Sudden death Father   . Bipolar disorder Maternal Grandmother   . Diabetes Maternal Grandfather   . Alcohol abuse Paternal Grandfather   . Liver disease Paternal Grandfather   . Hyperlipidemia Paternal Grandmother   . Kidney disease Paternal Grandmother   . Liver disease Paternal Grandmother   . Heart disease Paternal Grandmother   . Healthy Sister     Social History   Social History  . Marital status: Single    Spouse name: N/A  . Number of children: N/A  . Years of education: N/A   Occupational History  . Not on file.   Social History Main Topics  . Smoking status: Former Smoker    Packs/day: 0.00  . Smokeless tobacco: Never Used  . Alcohol use 0.0 oz/week     Comment: social  . Drug use: No  . Sexual activity: Yes    Birth control/ protection: None   Other Topics Concern  . Not on file   Social History  Narrative  . No narrative on file    No Known Allergies   Constitutional: Positive headache, fatigue and fever. Denies abrupt weight changes.  HEENT:  Positive runny nose. Denies eye redness, eye pain, pressure behind the eyes, facial pain, nasal congestion, ear pain, ringing in the ears, wax buildup or sore throat. Respiratory: Positive cough. Denies difficulty breathing or shortness of breath.  Cardiovascular: Denies chest pain, chest tightness, palpitations or swelling in the hands or feet.   No other specific complaints in a complete review of systems (except as listed in HPI above).  Objective:   BP 120/82   Pulse 77   Temp 98.1 F (36.7 C) (Oral)   Wt 229 lb (103.9 kg)   LMP 06/09/2016   SpO2 98%   BMI 38.11 kg/m  Wt Readings from Last 3 Encounters:  06/13/16 229 lb (103.9 kg)  12/12/15 208 lb (94.3 kg)  11/22/15 219 lb 8 oz (99.6 kg)     General: Appears her stated age, in NAD. HEENT: Head: normal shape and size, no sinus tenderness noted; Eyes: sclera white, no icterus, conjunctiva pink; Ears: Tm's gray and intact, normal light reflex, + serous effusion bilaterally; Nose:  mucosa pink and moist, septum midline; Throat/Mouth: + PND. Teeth present, mucosa pink and moist, no exudate noted, no lesions or ulcerations noted.  Neck: No cervical lymphadenopathy.  Cardiovascular: Normal rate and rhythm.  Pulmonary/Chest: Normal effort and positive vesicular breath sounds. No respiratory distress. No wheezes, rales or ronchi noted.       Assessment & Plan:   Viral Upper Respiratory Infection with Cough:  Get some rest and drink plenty of water Start Flonase OTC Ibuprofen for headahce Rx for Hycodan cough syrup Work note provided  Screen for Hep C:  Hep C antibody today  RTC as needed or if symptoms persist.   Webb Silversmith, NP

## 2016-06-13 NOTE — Patient Instructions (Signed)
Upper Respiratory Infection, Adult Most upper respiratory infections (URIs) are caused by a virus. A URI affects the nose, throat, and upper air passages. The most common type of URI is often called "the common cold." Follow these instructions at home:  Take medicines only as told by your doctor.  Gargle warm saltwater or take cough drops to comfort your throat as told by your doctor.  Use a warm mist humidifier or inhale steam from a shower to increase air moisture. This may make it easier to breathe.  Drink enough fluid to keep your pee (urine) clear or pale yellow.  Eat soups and other clear broths.  Have a healthy diet.  Rest as needed.  Go back to work when your fever is gone or your doctor says it is okay.  You may need to stay home longer to avoid giving your URI to others.  You can also wear a face mask and wash your hands often to prevent spread of the virus.  Use your inhaler more if you have asthma.  Do not use any tobacco products, including cigarettes, chewing tobacco, or electronic cigarettes. If you need help quitting, ask your doctor. Contact a doctor if:  You are getting worse, not better.  Your symptoms are not helped by medicine.  You have chills.  You are getting more short of breath.  You have brown or red mucus.  You have yellow or brown discharge from your nose.  You have pain in your face, especially when you bend forward.  You have a fever.  You have puffy (swollen) neck glands.  You have pain while swallowing.  You have white areas in the back of your throat. Get help right away if:  You have very bad or constant:  Headache.  Ear pain.  Pain in your forehead, behind your eyes, and over your cheekbones (sinus pain).  Chest pain.  You have long-lasting (chronic) lung disease and any of the following:  Wheezing.  Long-lasting cough.  Coughing up blood.  A change in your usual mucus.  You have a stiff neck.  You have  changes in your:  Vision.  Hearing.  Thinking.  Mood. This information is not intended to replace advice given to you by your health care provider. Make sure you discuss any questions you have with your health care provider. Document Released: 10/16/2007 Document Revised: 12/31/2015 Document Reviewed: 08/04/2013 Elsevier Interactive Patient Education  2017 [employer]  

## 2016-06-14 LAB — HEPATITIS C ANTIBODY: HCV Ab: NEGATIVE

## 2016-06-17 ENCOUNTER — Encounter: Payer: Self-pay | Admitting: Internal Medicine

## 2016-06-17 ENCOUNTER — Ambulatory Visit (INDEPENDENT_AMBULATORY_CARE_PROVIDER_SITE_OTHER): Payer: BLUE CROSS/BLUE SHIELD | Admitting: Internal Medicine

## 2016-06-17 VITALS — BP 120/78 | HR 70 | Temp 98.0°F | Wt 231.0 lb

## 2016-06-17 DIAGNOSIS — B379 Candidiasis, unspecified: Secondary | ICD-10-CM

## 2016-06-17 DIAGNOSIS — J069 Acute upper respiratory infection, unspecified: Secondary | ICD-10-CM

## 2016-06-17 DIAGNOSIS — T3695XA Adverse effect of unspecified systemic antibiotic, initial encounter: Secondary | ICD-10-CM

## 2016-06-17 MED ORDER — FLUCONAZOLE 150 MG PO TABS: 150.0000 mg | ORAL_TABLET | Freq: Once | ORAL | 0 refills | Status: AC

## 2016-06-17 MED ORDER — AZITHROMYCIN 250 MG PO TABS
ORAL_TABLET | ORAL | 0 refills | Status: DC
Start: 2016-06-17 — End: 2017-12-24

## 2016-06-17 NOTE — Progress Notes (Signed)
HPI  Pt presents to the clinic today to follow up viral URI. She was seen 06/13/16 for the same. Advised to take Mucinex and Flonase OTC. She was given a RX for Hycodan cough syrup. Since that time, she reports ongoing headache, runny nose and cough. She is blowing green mucous out of her nose. The cough is productive of green mucous. She denies fever, chills or body aches. She has tried Sudafed, Tylenol Cold and Flu and Hycodan with minimal relief. She has no history of allergies or breathing problems. She has had sick contacts.  Review of Systems        Past Medical History:  Diagnosis Date  . ADD (attention deficit disorder)   . Chicken pox   . Depression   . Ectopic pregnancy   . Elevated blood pressure   . Elevated cholesterol   . Frequent headaches   . Frequent headaches   . History of kidney stones     Family History  Problem Relation Age of Onset  . Hypertension Mother     Living  . Fibromyalgia Mother   . Diabetes Father 36    Deceased  . Heart attack Father   . Sudden death Father   . Bipolar disorder Maternal Grandmother   . Diabetes Maternal Grandfather   . Alcohol abuse Paternal Grandfather   . Liver disease Paternal Grandfather   . Hyperlipidemia Paternal Grandmother   . Kidney disease Paternal Grandmother   . Liver disease Paternal Grandmother   . Heart disease Paternal Grandmother   . Healthy Sister     Social History   Social History  . Marital status: Single    Spouse name: N/A  . Number of children: N/A  . Years of education: N/A   Occupational History  . Not on file.   Social History Main Topics  . Smoking status: Former Smoker    Packs/day: 0.00  . Smokeless tobacco: Never Used  . Alcohol use 0.0 oz/week     Comment: social  . Drug use: No  . Sexual activity: Yes    Birth control/ protection: None   Other Topics Concern  . Not on file   Social History Narrative  . No narrative on file    No Known Allergies   Constitutional:  Positive headache. Denies fatigue, fever or abrupt weight changes.  HEENT:  Positive runny nose. Denies eye redness, eye pain, pressure behind the eyes, facial pain, nasal congestion, ear pain, ringing in the ears, wax buildup or sore throat. Respiratory: Positive cough. Denies difficulty breathing or shortness of breath.  Cardiovascular: Denies chest pain, chest tightness, palpitations or swelling in the hands or feet.   No other specific complaints in a complete review of systems (except as listed in HPI above).  Objective:   BP 120/78 (BP Location: Right Arm, Patient Position: Sitting, Cuff Size: Large)   Pulse 70   Temp 98 F (36.7 C) (Oral)   Wt 231 lb (104.8 kg)   LMP 06/09/2016   SpO2 97%   BMI 38.44 kg/m   Wt Readings from Last 3 Encounters:  06/13/16 229 lb (103.9 kg)  12/12/15 208 lb (94.3 kg)  11/22/15 219 lb 8 oz (99.6 kg)     General: Appears her stated age, ill appearing, in NAD. HEENT: Head: normal shape and size, no sinus tenderness noted; Eyes: sclera white, no icterus, conjunctiva pink; Ears: Tm's gray and intact, normal light reflex, + serous effusion bilaterally; Nose: mucosa pink and moist, septum midline; Throat/Mouth:  Teeth present, mucosa pink and moist, no exudate noted, no lesions or ulcerations noted.  Neck: No cervical lymphadenopathy.  Cardiovascular: Normal rate and rhythm.  Pulmonary/Chest: Normal effort and coarse vesicular breath sounds. No respiratory distress. No wheezes, rales or ronchi noted.       Assessment & Plan:   Upper Respiratory Infection:  Get some rest and drink plenty of water eRx for Azithromax x 5 days eRx for Diflucan for antibiotic induced yeast infection Continue Hycodan at night. Work note provided  RTC as needed or if symptoms persist.   Webb Silversmith, NP

## 2016-06-17 NOTE — Patient Instructions (Signed)
Upper Respiratory Infection, Adult Most upper respiratory infections (URIs) are caused by a virus. A URI affects the nose, throat, and upper air passages. The most common type of URI is often called "the common cold." Follow these instructions at home:  Take medicines only as told by your doctor.  Gargle warm saltwater or take cough drops to comfort your throat as told by your doctor.  Use a warm mist humidifier or inhale steam from a shower to increase air moisture. This may make it easier to breathe.  Drink enough fluid to keep your pee (urine) clear or pale yellow.  Eat soups and other clear broths.  Have a healthy diet.  Rest as needed.  Go back to work when your fever is gone or your doctor says it is okay.  You may need to stay home longer to avoid giving your URI to others.  You can also wear a face mask and wash your hands often to prevent spread of the virus.  Use your inhaler more if you have asthma.  Do not use any tobacco products, including cigarettes, chewing tobacco, or electronic cigarettes. If you need help quitting, ask your doctor. Contact a doctor if:  You are getting worse, not better.  Your symptoms are not helped by medicine.  You have chills.  You are getting more short of breath.  You have brown or red mucus.  You have yellow or brown discharge from your nose.  You have pain in your face, especially when you bend forward.  You have a fever.  You have puffy (swollen) neck glands.  You have pain while swallowing.  You have white areas in the back of your throat. Get help right away if:  You have very bad or constant:  Headache.  Ear pain.  Pain in your forehead, behind your eyes, and over your cheekbones (sinus pain).  Chest pain.  You have long-lasting (chronic) lung disease and any of the following:  Wheezing.  Long-lasting cough.  Coughing up blood.  A change in your usual mucus.  You have a stiff neck.  You have  changes in your:  Vision.  Hearing.  Thinking.  Mood. This information is not intended to replace advice given to you by your health care provider. Make sure you discuss any questions you have with your health care provider. Document Released: 10/16/2007 Document Revised: 12/31/2015 Document Reviewed: 08/04/2013 Elsevier Interactive Patient Education  2017 [employer]  

## 2016-07-29 ENCOUNTER — Other Ambulatory Visit: Payer: Self-pay | Admitting: Orthopedic Surgery

## 2016-07-29 DIAGNOSIS — M65332 Trigger finger, left middle finger: Secondary | ICD-10-CM

## 2016-07-29 DIAGNOSIS — M65342 Trigger finger, left ring finger: Secondary | ICD-10-CM

## 2016-07-29 DIAGNOSIS — M65352 Trigger finger, left little finger: Secondary | ICD-10-CM

## 2016-07-29 DIAGNOSIS — M65311 Trigger thumb, right thumb: Secondary | ICD-10-CM

## 2016-07-29 DIAGNOSIS — M65351 Trigger finger, right little finger: Secondary | ICD-10-CM

## 2016-07-29 DIAGNOSIS — M65322 Trigger finger, left index finger: Secondary | ICD-10-CM

## 2016-07-29 DIAGNOSIS — M65321 Trigger finger, right index finger: Secondary | ICD-10-CM

## 2016-07-29 DIAGNOSIS — M65331 Trigger finger, right middle finger: Secondary | ICD-10-CM

## 2016-07-29 DIAGNOSIS — M65312 Trigger thumb, left thumb: Secondary | ICD-10-CM

## 2016-07-29 DIAGNOSIS — M65341 Trigger finger, right ring finger: Secondary | ICD-10-CM

## 2016-08-12 ENCOUNTER — Ambulatory Visit
Admission: RE | Admit: 2016-08-12 | Discharge: 2016-08-12 | Disposition: A | Discharge status: Home / Self Care | Payer: BLUE CROSS/BLUE SHIELD | Source: Ambulatory Visit | Attending: Orthopedic Surgery | Admitting: Orthopedic Surgery

## 2016-08-12 DIAGNOSIS — M65312 Trigger thumb, left thumb: Secondary | ICD-10-CM

## 2016-08-12 DIAGNOSIS — M65342 Trigger finger, left ring finger: Secondary | ICD-10-CM

## 2016-08-12 DIAGNOSIS — M65322 Trigger finger, left index finger: Secondary | ICD-10-CM

## 2016-08-12 DIAGNOSIS — M65352 Trigger finger, left little finger: Secondary | ICD-10-CM

## 2016-08-12 DIAGNOSIS — M65341 Trigger finger, right ring finger: Secondary | ICD-10-CM

## 2016-08-12 DIAGNOSIS — M65351 Trigger finger, right little finger: Secondary | ICD-10-CM

## 2016-08-12 DIAGNOSIS — M65311 Trigger thumb, right thumb: Secondary | ICD-10-CM

## 2016-08-12 DIAGNOSIS — M65332 Trigger finger, left middle finger: Secondary | ICD-10-CM

## 2016-08-12 DIAGNOSIS — M65321 Trigger finger, right index finger: Secondary | ICD-10-CM

## 2016-08-12 DIAGNOSIS — M65331 Trigger finger, right middle finger: Secondary | ICD-10-CM

## 2017-12-24 ENCOUNTER — Encounter (HOSPITAL_COMMUNITY): Payer: Self-pay | Admitting: *Deleted

## 2017-12-24 ENCOUNTER — Inpatient Hospital Stay (HOSPITAL_COMMUNITY)
Admission: AD | Admit: 2017-12-24 | Discharge: 2017-12-24 | Disposition: A | Discharge status: Home / Self Care | Payer: Self-pay | Source: Ambulatory Visit | Attending: Family Medicine | Admitting: Family Medicine

## 2017-12-24 ENCOUNTER — Inpatient Hospital Stay (HOSPITAL_COMMUNITY): Payer: Self-pay

## 2017-12-24 DIAGNOSIS — O209 Hemorrhage in early pregnancy, unspecified: Secondary | ICD-10-CM | POA: Insufficient documentation

## 2017-12-24 DIAGNOSIS — R109 Unspecified abdominal pain: Secondary | ICD-10-CM | POA: Insufficient documentation

## 2017-12-24 DIAGNOSIS — Z3491 Encounter for supervision of normal pregnancy, unspecified, first trimester: Secondary | ICD-10-CM

## 2017-12-24 DIAGNOSIS — O4690 Antepartum hemorrhage, unspecified, unspecified trimester: Secondary | ICD-10-CM

## 2017-12-24 DIAGNOSIS — Z3A01 Less than 8 weeks gestation of pregnancy: Secondary | ICD-10-CM | POA: Insufficient documentation

## 2017-12-24 DIAGNOSIS — Z87891 Personal history of nicotine dependence: Secondary | ICD-10-CM | POA: Insufficient documentation

## 2017-12-24 DIAGNOSIS — O4691 Antepartum hemorrhage, unspecified, first trimester: Secondary | ICD-10-CM

## 2017-12-24 DIAGNOSIS — O26891 Other specified pregnancy related conditions, first trimester: Secondary | ICD-10-CM

## 2017-12-24 LAB — URINALYSIS, ROUTINE W REFLEX MICROSCOPIC
Bilirubin Urine: NEGATIVE
Glucose, UA: NEGATIVE mg/dL
Hgb urine dipstick: NEGATIVE
Ketones, ur: NEGATIVE mg/dL
Leukocytes, UA: NEGATIVE
Nitrite: NEGATIVE
Protein, ur: NEGATIVE mg/dL
Specific Gravity, Urine: 1.021 (ref 1.005–1.030)
pH: 5 (ref 5.0–8.0)

## 2017-12-24 LAB — CBC
HCT: 42.7 % (ref 36.0–46.0)
Hemoglobin: 15 g/dL (ref 12.0–15.0)
MCH: 31.4 pg (ref 26.0–34.0)
MCHC: 35.1 g/dL (ref 30.0–36.0)
MCV: 89.3 fL (ref 78.0–100.0)
Platelets: 304 10*3/uL (ref 150–400)
RBC: 4.78 MIL/uL (ref 3.87–5.11)
RDW: 12.8 % (ref 11.5–15.5)
WBC: 12.2 10*3/uL — ABNORMAL HIGH (ref 4.0–10.5)

## 2017-12-24 LAB — GC/CHLAMYDIA PROBE AMP (~~LOC~~) NOT AT ARMC
Chlamydia: NEGATIVE
Neisseria Gonorrhea: NEGATIVE

## 2017-12-24 LAB — POCT PREGNANCY, URINE: Preg Test, Ur: POSITIVE — AB

## 2017-12-24 LAB — HCG, QUANTITATIVE, PREGNANCY: hCG, Beta Chain, Quant, S: 1773 m[IU]/mL — ABNORMAL HIGH (ref <5)

## 2017-12-24 LAB — WET PREP, GENITAL
Clue Cells Wet Prep HPF POC: NONE SEEN
Sperm: NONE SEEN
Trich, Wet Prep: NONE SEEN
Yeast Wet Prep HPF POC: NONE SEEN

## 2017-12-24 NOTE — MAU Note (Signed)
PT SAYS SHE HAS HAD AN ECTOPIC.   STARTED VAG BROWN  BLEEDING YESTERDAY  WHEN SHE WIPED.     CRAMPING STARTED YESTERDAY - STARTED ON LEFT SIDE . LMP - 6-28. HPT  1 WEEK AGO- POSITIVE .  NO BIRTH CONTROL

## 2017-12-24 NOTE — MAU Provider Note (Signed)
Chief Complaint: Vaginal Bleeding   First Provider Initiated Contact with Patient 12/24/17 0406      SUBJECTIVE HPI: Alexis Ellis is a 27 y.o. G2P0010 at [redacted]w[redacted]d by LMP who presents to maternity admissions reporting vaginal bleeding. She reports vaginal bleeding that started occurring yesterday afternoon. She describes the vaginal bleeding as dark brown bleeding when she wipes. She denies having to wear a pad or panty liner for vaginal bleeding. She reports lower abdominal cramping is associated with vaginal bleeding. Reports pain is specific to left lower quadrant. She rates pain 3/10. Has a hx of ectopic pregnancy. She denies vaginal itching/burning, urinary symptoms, h/a, dizziness, n/v, or fever/chills.She also has a medical history of ADD, depression and hypertension.   Past Medical History:  Diagnosis Date  . ADD (attention deficit disorder)   . Chicken pox   . Depression   . Ectopic pregnancy   . Elevated blood pressure   . Elevated cholesterol   . Frequent headaches   . Frequent headaches   . History of kidney stones    Past Surgical History:  Procedure Laterality Date  . LAPAROSCOPY N/A 09/16/2012   Procedure: LAPAROSCOPY OPERATIVE;  Surgeon: Allena Katz, MD;  Location: [facility];  Service: Gynecology;  Laterality: N/A;  Evacuation Hemoperitoneum  . UNILATERAL SALPINGECTOMY Right 09/16/2012   Procedure: UNILATERAL SALPINGECTOMY;  Surgeon: Allena Katz, MD;  Location: [facility];  Service: Gynecology;  Laterality: Right;  . WISDOM TOOTH EXTRACTION     Social History   Socioeconomic History  . Marital status: Single    Spouse name: Not on file  . Number of children: Not on file  . Years of education: Not on file  . Highest education level: Not on file  Occupational History  . Not on file  Social Needs  . Financial resource strain: Not on file  . Food insecurity:    Worry: Not on file    Inability: Not on file  . Transportation needs:    Medical: Not on file   Non-medical: Not on file  Tobacco Use  . Smoking status: Former Smoker    Packs/day: 0.00  . Smokeless tobacco: Never Used  Substance and Sexual Activity  . Alcohol use: Yes    Alcohol/week: 0.0 standard drinks    Comment: social  . Drug use: No  . Sexual activity: Yes    Birth control/protection: None  Lifestyle  . Physical activity:    Days per week: Not on file    Minutes per session: Not on file  . Stress: Not on file  Relationships  . Social connections:    Talks on phone: Not on file    Gets together: Not on file    Attends religious service: Not on file    Active member of club or organization: Not on file    Attends meetings of clubs or organizations: Not on file    Relationship status: Not on file  . Intimate partner violence:    Fear of current or ex partner: Not on file    Emotionally abused: Not on file    Physically abused: Not on file    Forced sexual activity: Not on file  Other Topics Concern  . Not on file  Social History Narrative  . Not on file   No current facility-administered medications on file prior to encounter.    Current Outpatient Medications on File Prior to Encounter  Medication Sig Dispense Refill  . acetaminophen-codeine (TYLENOL #3) 300-30 MG  tablet Take 1-2 tablets by mouth every 4 (four) hours as needed for moderate pain. 12 tablet 0  . amphetamine-dextroamphetamine (ADDERALL) 20 MG tablet 1 tablet 3 times daily  0  . FLUoxetine (PROZAC) 40 MG capsule Take 40 mg by mouth 2 (two) times daily.    Marland Kitchen ibuprofen (ADVIL,MOTRIN) 200 MG tablet Take 800 mg by mouth daily as needed.    Marland Kitchen azithromycin (ZITHROMAX) 250 MG tablet Take 2 tabs today, then 1 tab daily x 4 days 6 tablet 0  . clonazePAM (KLONOPIN) 1 MG tablet Take 1 mg by mouth 2 (two) times daily as needed for anxiety.   0  . diphenoxylate-atropine (LOMOTIL) 2.5-0.025 MG tablet Take 1 tablet by mouth 4 (four) times daily as needed for diarrhea or loose stools. 30 tablet 0  . fluticasone  (FLONASE) 50 MCG/ACT nasal spray Place 2 sprays into both nostrils daily as needed.   0  . HYDROcodone-homatropine (HYCODAN) 5-1.5 MG/5ML syrup Take 5 mLs by mouth every 8 (eight) hours as needed for cough. 120 mL 0  . promethazine (PHENERGAN) 12.5 MG tablet Take 1 tablet (12.5 mg total) by mouth every 6 (six) hours as needed for nausea or vomiting. 30 tablet 0   No Known Allergies  ROS:  Review of Systems  Constitutional: Negative.   Respiratory: Negative.   Cardiovascular: Negative.   Gastrointestinal: Positive for abdominal pain. Negative for constipation, diarrhea, nausea and vomiting.  Genitourinary: Positive for vaginal bleeding. Negative for difficulty urinating, dysuria, frequency and urgency.  Neurological: Negative.    I have reviewed patient's Past Medical Hx, Surgical Hx, Family Hx, Social Hx, medications and allergies.   Physical Exam   Patient Vitals for the past 24 hrs:  BP Temp Temp src Pulse Resp Height Weight  12/24/17 0411 (!) 146/96 98.5 F (36.9 C) Oral (!) 121 18 5\' 5"  (1.651 m) 102.3 kg   Constitutional: Well-developed, obese female in no acute distress.  Cardiovascular: normal rate Respiratory: normal effort GI: Abd soft, non-tender. Pos BS x 4 MS: Extremities nontender, no edema, normal ROM Neurologic: Alert and oriented x 4.  PELVIC EXAM: self swabs obtained   LAB RESULTS Results for orders placed or performed during the hospital encounter of 12/24/17 (from the past 24 hour(s))  GC/Chlamydia probe amp ([place])not at [facility]     Status: None   Collection Time: 12/24/17 12:00 AM  Result Value Ref Range   Chlamydia Negative    Neisseria gonorrhea Negative   CBC     Status: Abnormal   Collection Time: 12/24/17  4:20 AM  Result Value Ref Range   WBC 12.2 (H) 4.0 - 10.5 K/uL   RBC 4.78 3.87 - 5.11 MIL/uL   Hemoglobin 15.0 12.0 - 15.0 g/dL   HCT 42.7 36.0 - 46.0 %   MCV 89.3 78.0 - 100.0 fL   MCH 31.4 26.0 - 34.0 pg   MCHC 35.1 30.0 - 36.0 g/dL    RDW 12.8 11.5 - 15.5 %   Platelets 304 150 - 400 K/uL  hCG, quantitative, pregnancy     Status: Abnormal   Collection Time: 12/24/17  4:20 AM  Result Value Ref Range   hCG, Beta Chain, Quant, S 1,773 (H) <5 mIU/mL  Urinalysis, Routine w reflex microscopic     Status: None   Collection Time: 12/24/17  4:26 AM  Result Value Ref Range   Color, Urine YELLOW YELLOW   APPearance CLEAR CLEAR   Specific Gravity, Urine 1.021 1.005 - 1.030  pH 5.0 5.0 - 8.0   Glucose, UA NEGATIVE NEGATIVE mg/dL   Hgb urine dipstick NEGATIVE NEGATIVE   Bilirubin Urine NEGATIVE NEGATIVE   Ketones, ur NEGATIVE NEGATIVE mg/dL   Protein, ur NEGATIVE NEGATIVE mg/dL   Nitrite NEGATIVE NEGATIVE   Leukocytes, UA NEGATIVE NEGATIVE  Wet prep, genital     Status: Abnormal   Collection Time: 12/24/17  4:26 AM  Result Value Ref Range   Yeast Wet Prep HPF POC NONE SEEN NONE SEEN   Trich, Wet Prep NONE SEEN NONE SEEN   Clue Cells Wet Prep HPF POC NONE SEEN NONE SEEN   WBC, Wet Prep HPF POC FEW (A) NONE SEEN   Sperm NONE SEEN   Pregnancy, urine POC     Status: Abnormal   Collection Time: 12/24/17  4:32 AM  Result Value Ref Range   Preg Test, Ur POSITIVE (A) NEGATIVE   IMAGING US Ob Less Than 14 Weeks With Ob Transvaginal  Result Date: 12/24/2017 CLINICAL DATA:  Spotting and cramping. Positive urine pregnancy test. Quantitative beta HCG is pending. Previous history of ectopic pregnancy. Estimated gestational age by LMP is 7 weeks 1 day EXAM: OBSTETRIC <14 WK [place] AND TRANSVAGINAL OB US TECHNIQUE: Both transabdominal and transvaginal ultrasound examinations were performed for complete evaluation of the gestation as well as the maternal uterus, adnexal regions, and pelvic cul-de-sac. Transvaginal technique was performed to assess early pregnancy. COMPARISON:  None. FINDINGS: Intrauterine gestational sac: A single intrauterine gestational sac is identified. Yolk sac:  Yolk sac is present. Embryo:  Fetal pole is present. Cardiac  Activity: Fetal cardiac activity is observed. Heart Rate: 103 bpm CRL: 2.2 mm   5 w   5 d                  [place] EDC: 08/21/2018 Subchorionic hemorrhage:  None visualized. Maternal uterus/adnexae: Uterus is anteverted. No myometrial mass lesions identified. Both ovaries are visualized and are normal in appearance. Corpus luteal cyst on the right ovary. No pelvic fluid identified. IMPRESSION: Single intrauterine pregnancy. Estimated gestational age by crown-rump length is 5 weeks 5 days. No acute complication demonstrated sonographically. Electronically Signed   By: Lucienne Capers M.D.   On: 12/24/2017 05:06    MAU Management/MDM: Orders Placed This Encounter  Procedures  . Wet prep, genital  . US OB LESS THAN 14 WEEKS WITH OB TRANSVAGINAL  . Urinalysis, Routine w reflex microscopic  . CBC  . hCG, quantitative, pregnancy  . Nursing Communication  . Pregnancy, urine POC   Wet prep- negative  UA- negative  CBC- WNL  GC/C- negative   [place] and lab results reviewed with patient. Patient extremely excited and tearful at the news of pregnancy. Educated and discussed with patient need to make appointment with psychologist to d/c and change medication that is not safe during pregnancy including Adderall and Prozac- patient verbalizes understanding and plans to see her tomorrow for changes to medication.   Educated and discussed reasons to return to MAU for increased vaginal bleeding like a period and/or increased abdominal pain or cramping. Patient verbalizes understanding. List of safe medications in pregnancy given. List of OB providers in [place] given. Pt discharged. Pt stable at time of discharge.   ASSESSMENT 1. Normal IUP (intrauterine pregnancy) on prenatal ultrasound, first trimester   2. Abdominal pain during pregnancy in first trimester   3. Vaginal bleeding during pregnancy     PLAN Discharge home Make appointment to initiate prenatal care  Return to MAU as  needed  Make appointment with  psychologist to change medication being managed by her  Start taking prenatal vitamins  List of OB providers given  List of safe medication in pregnancy given    Darrol Poke  Certified Nurse-Midwife 12/24/2017  6:09 AM

## 2017-12-26 ENCOUNTER — Encounter (HOSPITAL_COMMUNITY): Payer: Self-pay | Admitting: *Deleted

## 2017-12-26 ENCOUNTER — Inpatient Hospital Stay (HOSPITAL_COMMUNITY): Payer: Self-pay

## 2017-12-26 ENCOUNTER — Inpatient Hospital Stay (HOSPITAL_COMMUNITY)
Admission: AD | Admit: 2017-12-26 | Discharge: 2017-12-26 | Disposition: A | Discharge status: Home / Self Care | Payer: Self-pay | Source: Ambulatory Visit | Attending: Obstetrics and Gynecology | Admitting: Obstetrics and Gynecology

## 2017-12-26 ENCOUNTER — Other Ambulatory Visit: Payer: Self-pay

## 2017-12-26 DIAGNOSIS — Z87891 Personal history of nicotine dependence: Secondary | ICD-10-CM | POA: Insufficient documentation

## 2017-12-26 DIAGNOSIS — O200 Threatened abortion: Secondary | ICD-10-CM | POA: Insufficient documentation

## 2017-12-26 DIAGNOSIS — Z6790 Unspecified blood type, Rh positive: Secondary | ICD-10-CM

## 2017-12-26 DIAGNOSIS — Z3A01 Less than 8 weeks gestation of pregnancy: Secondary | ICD-10-CM | POA: Insufficient documentation

## 2017-12-26 LAB — URINALYSIS, ROUTINE W REFLEX MICROSCOPIC
Bilirubin Urine: NEGATIVE
Glucose, UA: NEGATIVE mg/dL
Ketones, ur: NEGATIVE mg/dL
Nitrite: NEGATIVE
Protein, ur: 100 mg/dL — AB
Specific Gravity, Urine: 1.025 (ref 1.005–1.030)
WBC, UA: >50 WBC/hpf — ABNORMAL HIGH (ref 0–5)
pH: 5 (ref 5.0–8.0)

## 2017-12-26 LAB — CBC
HCT: 41.4 % (ref 36.0–46.0)
Hemoglobin: 14.7 g/dL (ref 12.0–15.0)
MCH: 31.7 pg (ref 26.0–34.0)
MCHC: 35.5 g/dL (ref 30.0–36.0)
MCV: 89.2 fL (ref 78.0–100.0)
Platelets: 309 10*3/uL (ref 150–400)
RBC: 4.64 MIL/uL (ref 3.87–5.11)
RDW: 12.8 % (ref 11.5–15.5)
WBC: 10.1 10*3/uL (ref 4.0–10.5)

## 2017-12-26 LAB — HCG, QUANTITATIVE, PREGNANCY: hCG, Beta Chain, Quant, S: 1588 m[IU]/mL — ABNORMAL HIGH (ref <5)

## 2017-12-26 NOTE — MAU Provider Note (Signed)
History     CSN: [id number]  Arrival date and time: 12/26/17 8786   First Provider Initiated Contact with Patient 12/26/17 [phone]      Chief Complaint  Patient presents with  . Vaginal Bleeding   G4P0030 @[redacted]w[redacted]d  here with VB. Bleeding has been ongoing for 3 days and light but became bright red and increased this am. Has not required a pad. No recent IC. No pain or cramping today. Was seen in MAU for bleeding and cramping 2 days ago and was found to have a viable IUP.   OB History    Gravida  4   Para      Term      Preterm      AB  3   Living        SAB  2   TAB      Ectopic  1   Multiple      Live Births              Past Medical History:  Diagnosis Date  . ADD (attention deficit disorder)   . Chicken pox   . Depression   . Ectopic pregnancy   . Elevated blood pressure   . Elevated cholesterol   . Frequent headaches   . Frequent headaches   . History of kidney stones     Past Surgical History:  Procedure Laterality Date  . LAPAROSCOPY N/A 09/16/2012   Procedure: LAPAROSCOPY OPERATIVE;  Surgeon: Allena Katz, MD;  Location: [facility];  Service: Gynecology;  Laterality: N/A;  Evacuation Hemoperitoneum  . UNILATERAL SALPINGECTOMY Right 09/16/2012   Procedure: UNILATERAL SALPINGECTOMY;  Surgeon: Allena Katz, MD;  Location: [facility];  Service: Gynecology;  Laterality: Right;  . WISDOM TOOTH EXTRACTION      Family History  Problem Relation Age of Onset  . Hypertension Mother        Living  . Fibromyalgia Mother   . Diabetes Father 51       Deceased  . Heart attack Father   . Sudden death Father   . Bipolar disorder Maternal Grandmother   . Diabetes Maternal Grandfather   . Alcohol abuse Paternal Grandfather   . Liver disease Paternal Grandfather   . Hyperlipidemia Paternal Grandmother   . Kidney disease Paternal Grandmother   . Liver disease Paternal Grandmother   . Heart disease Paternal Grandmother   . Healthy Sister     Social  History   Tobacco Use  . Smoking status: Former Smoker    Packs/day: 0.00  . Smokeless tobacco: Never Used  Substance Use Topics  . Alcohol use: Yes    Alcohol/week: 0.0 standard drinks    Comment: social  . Drug use: No    Allergies: No Known Allergies  Medications Prior to Admission  Medication Sig Dispense Refill Last Dose  . amphetamine-dextroamphetamine (ADDERALL) 20 MG tablet 1 tablet 3 times daily  0 12/23/2017 at Unknown time  . FLUoxetine (PROZAC) 40 MG capsule Take 40 mg by mouth 2 (two) times daily.   12/23/2017 at Unknown time  . fluticasone (FLONASE) 50 MCG/ACT nasal spray Place 2 sprays into both nostrils daily as needed.   0 More than a month at Unknown time  . promethazine (PHENERGAN) 12.5 MG tablet Take 1 tablet (12.5 mg total) by mouth every 6 (six) hours as needed for nausea or vomiting. 30 tablet 0 More than a month at Unknown time    Review of Systems  Constitutional: Negative for  fever.  Gastrointestinal: Negative for abdominal pain.  Genitourinary: Positive for vaginal bleeding.   Physical Exam   Blood pressure 140/84, pulse 91, temperature 98.1 F (36.7 C), temperature source Oral, last menstrual period 11/07/2017, SpO2 97 %.  Physical Exam  Constitutional: She is oriented to person, place, and time. She appears well-developed and well-nourished. No distress.  HENT:  Head: Normocephalic and atraumatic.  Neck: Normal range of motion.  Cardiovascular: Normal rate.  Respiratory: Effort normal. No respiratory distress.  GI: Soft. She exhibits no distension. There is no tenderness.  Genitourinary:  Genitourinary Comments: External: no lesions or erythema Vagina: rugated, pink, moist, small drk red bloody discharge, cleared with 1 fox swab, no further bleeding Cervix closed   Musculoskeletal: Normal range of motion.  Neurological: She is alert and oriented to person, place, and time.  Skin: Skin is warm and dry.  Psychiatric: She has a normal mood and  affect.   Results for orders placed or performed during the hospital encounter of 12/26/17 (from the past 24 hour(s))  Urinalysis, Routine w reflex microscopic     Status: Abnormal   Collection Time: 12/26/17  7:29 AM  Result Value Ref Range   Color, Urine YELLOW YELLOW   APPearance CLOUDY (A) CLEAR   Specific Gravity, Urine 1.025 1.005 - 1.030   pH 5.0 5.0 - 8.0   Glucose, UA NEGATIVE NEGATIVE mg/dL   Hgb urine dipstick LARGE (A) NEGATIVE   Bilirubin Urine NEGATIVE NEGATIVE   Ketones, ur NEGATIVE NEGATIVE mg/dL   Protein, ur 100 (A) NEGATIVE mg/dL   Nitrite NEGATIVE NEGATIVE   Leukocytes, UA TRACE (A) NEGATIVE   RBC / HPF 21-50 0 - 5 RBC/hpf   WBC, UA >50 (H) 0 - 5 WBC/hpf   Bacteria, UA RARE (A) NONE SEEN   Squamous Epithelial / LPF 6-10 0 - 5   Mucus PRESENT   CBC     Status: None   Collection Time: 12/26/17  7:49 AM  Result Value Ref Range   WBC 10.1 4.0 - 10.5 K/uL   RBC 4.64 3.87 - 5.11 MIL/uL   Hemoglobin 14.7 12.0 - 15.0 g/dL   HCT 41.4 36.0 - 46.0 %   MCV 89.2 78.0 - 100.0 fL   MCH 31.7 26.0 - 34.0 pg   MCHC 35.5 30.0 - 36.0 g/dL   RDW 12.8 11.5 - 15.5 %   Platelets 309 150 - 400 K/uL  hCG, quantitative, pregnancy     Status: Abnormal   Collection Time: 12/26/17  7:49 AM  Result Value Ref Range   hCG, Beta Chain, Quant, S 1,588 (H) <5 mIU/mL   US Ob Transvaginal  Result Date: 12/26/2017 CLINICAL DATA:  Pregnant, vaginal bleeding EXAM: TRANSVAGINAL OB ULTRASOUND TECHNIQUE: Transvaginal ultrasound was performed for complete evaluation of the gestation as well as the maternal uterus, adnexal regions, and pelvic cul-de-sac. COMPARISON:  12/24/2017 FINDINGS: Intrauterine gestational sac: Single Yolk sac:  Visualized. Embryo:  Visualized. Cardiac Activity: Visualized. Heart Rate: 89 bpm CRL:   3.7 mm   6 w 0 d                  [place] EDC: 08/21/2018 Subchorionic hemorrhage:  None visualized. Maternal uterus/adnexae: Bilateral ovaries are within normal limits. No free fluid.  IMPRESSION: Single live intrauterine gestation, with estimated gestational age [redacted] weeks 0 days by crown-rump length, as above. First trimester bradycardia. Consider follow-up pelvic ultrasound in 14 days. Electronically Signed   By: Julian Hy M.D.   On:  12/26/2017 09:09   MAU Course  Procedures  MDM Labs and [place] ordered and reviewed. Bradycardia noted, discussed this is most likely the beginning sx of SAB. Recommend f/u US in 1 week. Stable for discharge home.  Assessment and Plan   1. Threatened abortion   2. Blood type, Rh positive    Discharge home Follow up in 1 week for [place]- ordered SAB/bleeding return precautions Pelvic rest Tylenol prn  Allergies as of 12/26/2017   No Known Allergies     Medication List    TAKE these medications   amphetamine-dextroamphetamine 20 MG tablet Commonly known as:  ADDERALL 1 tablet 3 times daily   FLUoxetine 40 MG capsule Commonly known as:  PROZAC Take 40 mg by mouth 2 (two) times daily.   fluticasone 50 MCG/ACT nasal spray Commonly known as:  FLONASE Place 2 sprays into both nostrils daily as needed.   promethazine 12.5 MG tablet Commonly known as:  PHENERGAN Take 1 tablet (12.5 mg total) by mouth every 6 (six) hours as needed for nausea or vomiting.      Julianne Handler, CNM 12/26/2017, 9:28 AM

## 2017-12-26 NOTE — MAU Note (Signed)
Pt presents with c/o VB.  Reports was seen 2 days ago for spotting & cramping, viable IUP confirmed, today states bleeding has increased & is brighter.  Denies abdominal cramping today.

## 2017-12-26 NOTE — Discharge Instructions (Signed)
Threatened Miscarriage °A threatened miscarriage is when you have vaginal bleeding during your first 20 weeks of pregnancy but the pregnancy has not ended. Your doctor will do tests to make sure you are still pregnant. The cause of the bleeding may not be known. This condition does not mean your pregnancy will end. It does increase the risk of it ending (complete miscarriage). °Follow these instructions at home: °· Make sure you keep all your doctor visits for prenatal care. °· Get plenty of rest. °· Do not have sex or use tampons if you have vaginal bleeding. °· Do not douche. °· Do not smoke or use drugs. °· Do not drink alcohol. °· Avoid caffeine. °Contact a doctor if: °· You have light bleeding from your vagina. °· You have belly pain or cramping. °· You have a fever. °Get help right away if: °· You have heavy bleeding from your vagina. °· You have clots of blood coming from your vagina. °· You have bad pain or cramps in your low back or belly. °· You have fever, chills, and bad belly pain. °This information is not intended to replace advice given to you by your health care provider. Make sure you discuss any questions you have with your health care provider. °Document Released: 04/11/2008 Document Revised: 10/05/2015 Document Reviewed: 02/23/2013 °Elsevier Interactive Patient Education © 2018 [employer] ° °

## 2017-12-28 ENCOUNTER — Encounter (HOSPITAL_COMMUNITY): Payer: Self-pay

## 2017-12-28 ENCOUNTER — Inpatient Hospital Stay (HOSPITAL_COMMUNITY)
Admission: AD | Admit: 2017-12-28 | Discharge: 2017-12-28 | Disposition: A | Discharge status: Home / Self Care | Payer: Medicaid Other | Source: Ambulatory Visit | Attending: Obstetrics and Gynecology | Admitting: Obstetrics and Gynecology

## 2017-12-28 ENCOUNTER — Other Ambulatory Visit: Payer: Self-pay

## 2017-12-28 DIAGNOSIS — O039 Complete or unspecified spontaneous abortion without complication: Secondary | ICD-10-CM | POA: Insufficient documentation

## 2017-12-28 DIAGNOSIS — Z3A01 Less than 8 weeks gestation of pregnancy: Secondary | ICD-10-CM | POA: Insufficient documentation

## 2017-12-28 DIAGNOSIS — Z87891 Personal history of nicotine dependence: Secondary | ICD-10-CM | POA: Insufficient documentation

## 2017-12-28 NOTE — Discharge Instructions (Signed)

## 2017-12-28 NOTE — MAU Note (Signed)
Pt left before receiving paperwork and signing AVS

## 2017-12-28 NOTE — MAU Provider Note (Signed)
History     CSN: [id number]  Arrival date and time: 12/28/17 1553   First Provider Initiated Contact with Patient 12/28/17 1730      Chief Complaint  Patient presents with  . Vaginal Bleeding  . Abdominal Pain   HPI   Ms.Alexis Ellis is a 28 y.o. female G30P0030 @ [redacted]w[redacted]d here In MAU with concerns about her pregnancy. She was here 2 days ago and was told the babies HR was low and that the baby had a 50/50 chance of living. She has continued to bleeding since she left 2 days ago; she wants to know the progress of the baby. The bleeding is light, she has some mild cramping however it is controlled at home. She is not taking any medications. No dizziness.   OB History    Gravida  4   Para      Term      Preterm      AB  3   Living        SAB  2   TAB      Ectopic  1   Multiple      Live Births              Past Medical History:  Diagnosis Date  . ADD (attention deficit disorder)   . Chicken pox   . Depression   . Ectopic pregnancy   . Elevated blood pressure   . Elevated cholesterol   . Frequent headaches   . Frequent headaches   . History of kidney stones     Past Surgical History:  Procedure Laterality Date  . LAPAROSCOPY N/A 09/16/2012   Procedure: LAPAROSCOPY OPERATIVE;  Surgeon: Allena Katz, MD;  Location: [facility];  Service: Gynecology;  Laterality: N/A;  Evacuation Hemoperitoneum  . UNILATERAL SALPINGECTOMY Right 09/16/2012   Procedure: UNILATERAL SALPINGECTOMY;  Surgeon: Allena Katz, MD;  Location: [facility];  Service: Gynecology;  Laterality: Right;  . WISDOM TOOTH EXTRACTION      Family History  Problem Relation Age of Onset  . Hypertension Mother        Living  . Fibromyalgia Mother   . Diabetes Father 50       Deceased  . Heart attack Father   . Sudden death Father   . Bipolar disorder Maternal Grandmother   . Diabetes Maternal Grandfather   . Alcohol abuse Paternal Grandfather   . Liver disease Paternal Grandfather   .  Hyperlipidemia Paternal Grandmother   . Kidney disease Paternal Grandmother   . Liver disease Paternal Grandmother   . Heart disease Paternal Grandmother   . Healthy Sister     Social History   Tobacco Use  . Smoking status: Former Smoker    Packs/day: 0.00  . Smokeless tobacco: Never Used  Substance Use Topics  . Alcohol use: Yes    Alcohol/week: 0.0 standard drinks    Comment: social  . Drug use: No    Allergies: No Known Allergies  Medications Prior to Admission  Medication Sig Dispense Refill Last Dose  . amphetamine-dextroamphetamine (ADDERALL) 20 MG tablet 1 tablet 3 times daily  0 12/23/2017 at Unknown time  . FLUoxetine (PROZAC) 40 MG capsule Take 40 mg by mouth 2 (two) times daily.   12/23/2017 at Unknown time  . fluticasone (FLONASE) 50 MCG/ACT nasal spray Place 2 sprays into both nostrils daily as needed.   0 More than a month at Unknown time  . promethazine (PHENERGAN) 12.5 MG  tablet Take 1 tablet (12.5 mg total) by mouth every 6 (six) hours as needed for nausea or vomiting. 30 tablet 0 More than a month at Unknown time   No results found for this or any previous visit (from the past 48 hour(s)).  Review of Systems  Constitutional: Negative for fever.  Gastrointestinal: Negative for abdominal pain.  Genitourinary: Positive for vaginal bleeding.  Neurological: Negative for dizziness.   Physical Exam   Blood pressure 139/76, pulse 80, temperature 98.3 F (36.8 C), temperature source Oral, resp. rate 18, weight 103.8 kg, last menstrual period 11/07/2017, SpO2 98 %.  Physical Exam  Constitutional: She is oriented to person, place, and time. She appears well-developed and well-nourished. No distress.  HENT:  Head: Normocephalic.  Musculoskeletal: Normal range of motion.  Neurological: She is alert and oriented to person, place, and time.  Skin: Skin is warm. She is not diaphoretic.  Psychiatric: Her behavior is normal.   MAU Course  Procedures   None  MDM  Quant 8/14 1773 Quant 8/16: 1588 [place] on 8/16 shows a 6 week fetus with bradycardia Discussed with patient in detail that this is a definite SAB in process.  A positive blood type Initially quant was ordered today and patient refused; patient was upset and wanted to leave.    Assessment and Plan   A:  1. Spontaneous miscarriage     P:  Discharge home in stable condition Message sent to cancel [place] ordered Message sent to the [facility] for an office visit in 1 week for Quant Return to MAU If symptoms worsen Support Given.   Noni Saupe I, NP 12/29/2017 1:45 PM

## 2017-12-28 NOTE — MAU Note (Signed)
Pretty sure she is having a miscarriage.  Has continued to bleed. Has been cramping.

## 2018-01-06 ENCOUNTER — Ambulatory Visit: Payer: Medicaid Other

## 2018-05-13 NOTE — L&D Delivery Note (Signed)
Patient is a 28 y.o. now G5P1 s/p NSVD at [redacted]w[redacted]d, who was admitted for IOL for severe PEC.  She progressed with augmentation (cytotec,FB,Pit, AROM) to complete and pushed <1 minutes to deliver. Spontaneous cry on delivery. Cord clamping delayed by several minutes until NICU team arrived then clamped by CNM and cut by FOB- [place].  Placenta intact and spontaneous, bleeding minimal.  1st degree laceration repaired without difficulty.  Mom and baby stable prior to transfer to postpartum. She plans on breastfeeding. She is unsure of method for birth control.  Delivery Note At 5:53 AM a viable and healthy female was delivered via Vaginal, Spontaneous (Presentation: Left Occiput Anterior).  APGAR: 8, ; weight 4 lb 0.6 oz (1830 g).   Placenta status: Spontaneous, Intact.  Cord: 3 vessels with the following complications: None.   Anesthesia: Local for repair Episiotomy: None Lacerations:  1st degree perineal  Suture Repair: 3.0 vicryl Est. Blood Loss (mL):  75  Mom to postpartum.  Baby to NICU.  Alexis Ellis CNM 05/01/2019, 6:28 AM

## 2018-10-07 DIAGNOSIS — Z3200 Encounter for pregnancy test, result unknown: Secondary | ICD-10-CM | POA: Diagnosis not present

## 2018-10-07 DIAGNOSIS — Z3009 Encounter for other general counseling and advice on contraception: Secondary | ICD-10-CM | POA: Diagnosis not present

## 2018-10-19 ENCOUNTER — Encounter (HOSPITAL_COMMUNITY): Payer: Self-pay | Admitting: *Deleted

## 2018-10-19 ENCOUNTER — Other Ambulatory Visit: Payer: Self-pay

## 2018-10-19 ENCOUNTER — Inpatient Hospital Stay (HOSPITAL_COMMUNITY): Payer: Medicaid Other

## 2018-10-19 ENCOUNTER — Inpatient Hospital Stay (HOSPITAL_COMMUNITY)
Admission: AD | Admit: 2018-10-19 | Discharge: 2018-10-19 | Disposition: A | Discharge status: Home / Self Care | Payer: Medicaid Other | Attending: Obstetrics & Gynecology | Admitting: Obstetrics & Gynecology

## 2018-10-19 DIAGNOSIS — Z6790 Unspecified blood type, Rh positive: Secondary | ICD-10-CM

## 2018-10-19 DIAGNOSIS — R102 Pelvic and perineal pain: Secondary | ICD-10-CM | POA: Insufficient documentation

## 2018-10-19 DIAGNOSIS — Z87891 Personal history of nicotine dependence: Secondary | ICD-10-CM | POA: Insufficient documentation

## 2018-10-19 DIAGNOSIS — O26899 Other specified pregnancy related conditions, unspecified trimester: Secondary | ICD-10-CM

## 2018-10-19 DIAGNOSIS — O360110 Maternal care for anti-D [Rh] antibodies, first trimester, not applicable or unspecified: Secondary | ICD-10-CM

## 2018-10-19 DIAGNOSIS — Z3490 Encounter for supervision of normal pregnancy, unspecified, unspecified trimester: Secondary | ICD-10-CM

## 2018-10-19 DIAGNOSIS — Z3A01 Less than 8 weeks gestation of pregnancy: Secondary | ICD-10-CM

## 2018-10-19 DIAGNOSIS — O26891 Other specified pregnancy related conditions, first trimester: Secondary | ICD-10-CM | POA: Insufficient documentation

## 2018-10-19 LAB — CBC
HCT: 38.6 % (ref 36.0–46.0)
Hemoglobin: 13.3 g/dL (ref 12.0–15.0)
MCH: 30 pg (ref 26.0–34.0)
MCHC: 34.5 g/dL (ref 30.0–36.0)
MCV: 86.9 fL (ref 80.0–100.0)
Platelets: 281 10*3/uL (ref 150–400)
RBC: 4.44 MIL/uL (ref 3.87–5.11)
RDW: 12.6 % (ref 11.5–15.5)
WBC: 8.3 10*3/uL (ref 4.0–10.5)
nRBC: 0 % (ref 0.0–0.2)

## 2018-10-19 LAB — URINALYSIS, ROUTINE W REFLEX MICROSCOPIC
Bilirubin Urine: NEGATIVE
Glucose, UA: NEGATIVE mg/dL
Hgb urine dipstick: NEGATIVE
Ketones, ur: NEGATIVE mg/dL
Leukocytes,Ua: NEGATIVE
Nitrite: NEGATIVE
Protein, ur: NEGATIVE mg/dL
Specific Gravity, Urine: 1.021 (ref 1.005–1.030)
pH: 5 (ref 5.0–8.0)

## 2018-10-19 LAB — POCT PREGNANCY, URINE: Preg Test, Ur: POSITIVE — AB

## 2018-10-19 LAB — HCG, QUANTITATIVE, PREGNANCY: hCG, Beta Chain, Quant, S: 8195 m[IU]/mL — ABNORMAL HIGH (ref <5)

## 2018-10-19 NOTE — Discharge Instructions (Signed)
[facility]     Phone: [phone]  [facility] [place]  Phone: [phone]  [facility] [place] [facility] [place]  Phone: [place] [facility] [place] [facility] [place]                           Phone: [place] [facility]          Phone: [phone]  [facility]    Phone: [place])    Phone: [facility]    Phone: [phone]  [facility]    Phone: [place]    Phone: [phone]  [facility]  Phone: [place]               Phone: [phone]  [facility]   Phone: [phone]  [facility]    Phone: [phone]                     Safe Medications in Pregnancy    Acne: Benzoyl Peroxide Salicylic Acid  Backache/Headache: Tylenol: 2 regular strength every 4 hours OR              2 Extra strength every 6 hours  Colds/Coughs/Allergies: Benadryl (alcohol free) 25 mg every 6 hours as needed Breath right strips Claritin Cepacol throat lozenges Chloraseptic throat spray Cold-Eeze- up to three times per day Cough drops, alcohol free Flonase (by prescription only) Guaifenesin Mucinex Robitussin DM (plain only, alcohol free) Saline nasal spray/drops Sudafed (pseudoephedrine) & Actifed ** use only after [redacted] weeks gestation and if you do not have high blood pressure Tylenol Vicks Vaporub Zinc lozenges Zyrtec   Constipation: Colace Ducolax suppositories Fleet enema Glycerin suppositories Metamucil Milk of magnesia Miralax Senokot Smooth move tea  Diarrhea: Kaopectate Imodium A-D  *NO pepto Bismol  Hemorrhoids: Anusol Anusol HC Preparation  H Tucks  Indigestion: Tums Maalox Mylanta Zantac  Pepcid  Insomnia: Benadryl (alcohol free) 25mg  every 6 hours as needed Tylenol PM Unisom, no Gelcaps  Leg Cramps: Tums MagGel  Nausea/Vomiting:  Bonine Dramamine Emetrol Ginger extract Sea bands Meclizine  Nausea medication to take during pregnancy:  Unisom (doxylamine succinate 25 mg tablets) Take one tablet daily at bedtime. If symptoms are not adequately controlled, the dose can be increased to a maximum recommended dose of two tablets daily (1/2 tablet in the morning, 1/2 tablet mid-afternoon and one at bedtime). Vitamin B6 100mg  tablets. Take one tablet twice a day (up to 200 mg per day).  Skin Rashes: Aveeno products Benadryl cream or 25mg  every 6 hours as needed Calamine Lotion 1% cortisone cream  Yeast infection: Gyne-lotrimin 7 Monistat 7   **If taking multiple medications, please check labels to avoid duplicating the same active ingredients **take medication as directed on the label ** Do not exceed 4000 mg of tylenol in 24 hours **Do not take medications that contain aspirin or ibuprofen   Abdominal Pain During Pregnancy  Abdominal pain is common during pregnancy, and has many possible causes. Some causes are more serious than others, and sometimes the cause is not known. Abdominal pain can be a sign that labor is starting. It can also be caused by normal growth and stretching of muscles and ligaments during pregnancy.  Always tell your health care provider if you have any abdominal pain. Follow these instructions at home:  Do not have sex or put anything in your vagina until your pain goes away completely.  Get plenty of rest until your pain improves.  Drink enough fluid to keep your urine pale yellow.  Take over-the-counter and prescription medicines only as told by your health care provider.  Keep all follow-up visits as told by your health care provider. This is important. Contact a health  care provider if:  Your pain continues or gets worse after resting.  You have lower abdominal pain that: ? Comes and goes at regular intervals. ? Spreads to your back. ? Is similar to menstrual cramps.  You have pain or burning when you urinate. Get help right away if:  You have a fever or chills.  You have vaginal bleeding.  You are leaking fluid from your vagina.  You are passing tissue from your vagina.  You have vomiting or diarrhea that lasts for more than 24 hours.  Your baby is moving less than usual.  You feel very weak or faint.  You have shortness of breath.  You develop severe pain in your upper abdomen. Summary  Abdominal pain is common during pregnancy, and has many possible causes.  If you experience abdominal pain during pregnancy, tell your health care provider right away.  Follow your health care provider's home care instructions and keep all follow-up visits as directed. This information is not intended to replace advice given to you by your health care provider. Make sure you discuss any questions you have with your health care provider. Document Released: 04/29/2005 Document Revised: 08/01/2016 Document Reviewed: 08/01/2016 Elsevier Interactive Patient Education  2019 [employer].

## 2018-10-19 NOTE — MAU Note (Signed)
Alexis Ellis is a 28 y.o. at Unknown here in MAU reporting:right shoulder pain with lower abdominal cramping. + pregnancy test two weeks ago. HX of ectopic in 2014 LMP: 09/12/2018 Onset of complaint: 3 days ago Pain score: 5 Vitals:   10/19/18 1510 10/19/18 1511  BP:  (!) 147/78  Pulse: 98   Resp: 16   Temp: 98 F (36.7 C)   SpO2: 100%       Lab orders placed from triage: UPT/ UA

## 2018-10-19 NOTE — MAU Provider Note (Signed)
History     CSN: [id number]  Arrival date and time: 10/19/18 1434   First Provider Initiated Contact with Patient 10/19/18 1600      Chief Complaint  Patient presents with  . Possible Pregnancy  . Shoulder Pain   Alexis Ellis is a 28 y.o. G5P0040 at [redacted]w[redacted]d who presents to MAU for pelvic cramping.  Onset: 3days ago Location: across pelvis, bilateral Duration: 3days Character: menstrual-like cramping Aggravating/Associated: none/fish-like odor Relieving: none Treatment: Tylenol - works to relieve pain Severity: 5/10  Pt denies VB, vaginal discharge/itching. Pt denies N/V, abdominal pain, constipation, diarrhea, or urinary problems. Pt denies fever, chills, fatigue, sweating or changes in appetite. Pt denies SOB or chest pain. Pt denies dizziness, HA, light-headedness, weakness.  Problems this pregnancy include: hx of recurrent miscarriage, hx of ectopic. Allergies? NKDA Current medications/supplements? None - was taking Prozac and Adderal, but stopped when she learned she was pregnant Prenatal care provider? [facility], next appt 11/25/2018   OB History    Gravida  5   Para      Term      Preterm      AB  4   Living        SAB  3   TAB      Ectopic  1   Multiple      Live Births              Past Medical History:  Diagnosis Date  . ADD (attention deficit disorder)   . Chicken pox   . Depression   . Ectopic pregnancy   . Elevated blood pressure   . Elevated cholesterol   . Frequent headaches   . Frequent headaches   . History of kidney stones     Past Surgical History:  Procedure Laterality Date  . LAPAROSCOPY N/A 09/16/2012   Procedure: LAPAROSCOPY OPERATIVE;  Surgeon: Allena Katz, MD;  Location: [facility];  Service: Gynecology;  Laterality: N/A;  Evacuation Hemoperitoneum  . UNILATERAL SALPINGECTOMY Right 09/16/2012   Procedure: UNILATERAL SALPINGECTOMY;  Surgeon: Allena Katz, MD;  Location: [facility];  Service: Gynecology;   Laterality: Right;  . WISDOM TOOTH EXTRACTION      Family History  Problem Relation Age of Onset  . Hypertension Mother        Living  . Fibromyalgia Mother   . Diabetes Father 49       Deceased  . Heart attack Father   . Sudden death Father   . Bipolar disorder Maternal Grandmother   . Diabetes Maternal Grandfather   . Alcohol abuse Paternal Grandfather   . Liver disease Paternal Grandfather   . Hyperlipidemia Paternal Grandmother   . Kidney disease Paternal Grandmother   . Liver disease Paternal Grandmother   . Heart disease Paternal Grandmother   . Healthy Sister     Social History   Tobacco Use  . Smoking status: Former Smoker    Packs/day: 0.00  . Smokeless tobacco: Never Used  Substance Use Topics  . Alcohol use: Not Currently    Alcohol/week: 0.0 standard drinks    Comment: social  . Drug use: No    Allergies: No Known Allergies  Medications Prior to Admission  Medication Sig Dispense Refill Last Dose  . amphetamine-dextroamphetamine (ADDERALL) 20 MG tablet 1 tablet 3 times daily  0 12/23/2017 at Unknown time  . FLUoxetine (PROZAC) 40 MG capsule Take 40 mg by mouth 2 (two) times daily.   12/23/2017 at Unknown time  .  fluticasone (FLONASE) 50 MCG/ACT nasal spray Place 2 sprays into both nostrils daily as needed.   0 More than a month at Unknown time  . promethazine (PHENERGAN) 12.5 MG tablet Take 1 tablet (12.5 mg total) by mouth every 6 (six) hours as needed for nausea or vomiting. 30 tablet 0 More than a month at Unknown time    Review of Systems  Constitutional: Negative for chills, diaphoresis, fatigue and fever.  Respiratory: Negative for shortness of breath.   Cardiovascular: Negative for chest pain.  Gastrointestinal: Negative for abdominal pain, constipation, diarrhea, nausea and vomiting.  Genitourinary: Positive for pelvic pain and vaginal discharge. Negative for dysuria, flank pain, frequency, urgency and vaginal bleeding.  Neurological: Negative  for dizziness, weakness, light-headedness and headaches.   Physical Exam   Blood pressure 124/69, pulse 93, temperature 98 F (36.7 C), resp. rate 16, last menstrual period 09/12/2018, SpO2 100 %, unknown if currently breastfeeding.  Patient Vitals for the past 24 hrs:  BP Temp Pulse Resp SpO2  10/19/18 1530 124/69 - 93 - -  10/19/18 1511 (!) 147/78 - - - -  10/19/18 1510 - 98 F (36.7 C) 98 16 100 %   Physical Exam  Constitutional: She is oriented to person, place, and time. She appears well-developed and well-nourished. No distress.  HENT:  Head: Normocephalic and atraumatic.  Respiratory: Effort normal.  GI: Soft. She exhibits no distension and no mass. There is no abdominal tenderness. There is no rebound and no guarding.  Genitourinary: There is no rash, tenderness or lesion on the right labia. There is no rash, tenderness or lesion on the left labia. Uterus is not fixed and not tender. Cervix exhibits no motion tenderness, no discharge and no friability. Right adnexum displays no mass, no tenderness and no fullness. Left adnexum displays no mass, no tenderness and no fullness.    No vaginal discharge, tenderness or bleeding.  No tenderness or bleeding in the vagina.  Neurological: She is alert and oriented to person, place, and time.  Skin: Skin is warm and dry. She is not diaphoretic.  Psychiatric: She has a normal mood and affect. Her behavior is normal. Judgment and thought content normal.   Results for orders placed or performed during the hospital encounter of 10/19/18 (from the past 24 hour(s))  Urinalysis, Routine w reflex microscopic     Status: None   Collection Time: 10/19/18  3:16 PM  Result Value Ref Range   Color, Urine YELLOW YELLOW   APPearance CLEAR CLEAR   Specific Gravity, Urine 1.021 1.005 - 1.030   pH 5.0 5.0 - 8.0   Glucose, UA NEGATIVE NEGATIVE mg/dL   Hgb urine dipstick NEGATIVE NEGATIVE   Bilirubin Urine NEGATIVE NEGATIVE   Ketones, ur NEGATIVE  NEGATIVE mg/dL   Protein, ur NEGATIVE NEGATIVE mg/dL   Nitrite NEGATIVE NEGATIVE   Leukocytes,Ua NEGATIVE NEGATIVE  Pregnancy, urine POC     Status: Abnormal   Collection Time: 10/19/18  3:18 PM  Result Value Ref Range   Preg Test, Ur POSITIVE (A) NEGATIVE  CBC     Status: None   Collection Time: 10/19/18  3:55 PM  Result Value Ref Range   WBC 8.3 4.0 - 10.5 K/uL   RBC 4.44 3.87 - 5.11 MIL/uL   Hemoglobin 13.3 12.0 - 15.0 g/dL   HCT 38.6 36.0 - 46.0 %   MCV 86.9 80.0 - 100.0 fL   MCH 30.0 26.0 - 34.0 pg   MCHC 34.5 30.0 - 36.0 g/dL  RDW 12.6 11.5 - 15.5 %   Platelets 281 150 - 400 K/uL   nRBC 0.0 0.0 - 0.2 %  hCG, quantitative, pregnancy     Status: Abnormal   Collection Time: 10/19/18  3:55 PM  Result Value Ref Range   hCG, Beta Chain, Quant, S 8,195 (H) <5 mIU/mL   US Ob Less Than 14 Weeks With Ob Transvaginal  Result Date: 10/19/2018 CLINICAL DATA:  Pelvic pain.  Pregnant patient. EXAM: OBSTETRIC <14 WK [place] AND TRANSVAGINAL OB US TECHNIQUE: Both transabdominal and transvaginal ultrasound examinations were performed for complete evaluation of the gestation as well as the maternal uterus, adnexal regions, and pelvic cul-de-sac. Transvaginal technique was performed to assess early pregnancy. COMPARISON:  None. FINDINGS: Intrauterine gestational sac: Single Yolk sac:  Visualized. Embryo:  Visualized. Cardiac Activity: Visualized. Heart Rate: Could not measured due to small fetal size. MSD:   mm    w     d CRL:  2.61 mm   5 w   5 d                  [place] EDC: June 16, 2019 Subchorionic hemorrhage:  None visualized. Maternal uterus/adnexae: Normal. IMPRESSION: Single live IUP.  No cause for pain identified. Electronically Signed   By: Dorise Bullion III M.D   On: 10/19/2018 16:48    MAU Course  Procedures  MDM -r/o ectopic -UA: WNL -CBC: WNL -[place]: single IUP, FHB unable to be measured d/t small size -hCG: 8195 -ABO: A Positive -WetPrep: pending at time of discharge, pt OK with  waiting for a call for results -GC/CT collected -pt discharged to home in stable condition  Orders Placed This Encounter  Procedures  . Wet prep, genital    Standing Status:   Standing    Number of Occurrences:   1  . US OB LESS THAN 14 WEEKS WITH OB TRANSVAGINAL    Standing Status:   Standing    Number of Occurrences:   1    Order Specific Question:   Symptom/Reason for Exam    Answer:   Pelvic pain in pregnancy [[id number]]  . Urinalysis, Routine w reflex microscopic    Standing Status:   Standing    Number of Occurrences:   1  . CBC    Standing Status:   Standing    Number of Occurrences:   1  . hCG, quantitative, pregnancy    Standing Status:   Standing    Number of Occurrences:   1  . Pregnancy, urine POC    Standing Status:   Standing    Number of Occurrences:   1  . Discharge patient    Order Specific Question:   Discharge disposition    Answer:   01-Home or Self Care [1]    Order Specific Question:   Discharge patient date    Answer:   10/19/2018   No orders of the defined types were placed in this encounter.  Assessment and Plan   1. Pelvic pain in pregnancy   2. Intrauterine pregnancy   3. [redacted] weeks gestation of pregnancy   4. Blood type, Rh positive    Allergies as of 10/19/2018   No Known Allergies     Medication List    TAKE these medications   amphetamine-dextroamphetamine 20 MG tablet Commonly known as:  ADDERALL 1 tablet 3 times daily   FLUoxetine 40 MG capsule Commonly known as:  PROZAC Take 40 mg by mouth 2 (two) times daily.  fluticasone 50 MCG/ACT nasal spray Commonly known as:  FLONASE Place 2 sprays into both nostrils daily as needed.   promethazine 12.5 MG tablet Commonly known as:  PHENERGAN Take 1 tablet (12.5 mg total) by mouth every 6 (six) hours as needed for nausea or vomiting.      -pt instructed to call psychiatrist to discuss medications in pregnancy -will call with culture results, if positive -list of [facility] providers given,  pt instructed to call to schedule NOB -list of safe medications to take in pregnancy given -return MAU precautions given -pt questions asked and answered -pt discharged to home in stable condition  Elmyra Ricks E Brailey Buescher 10/19/2018, 5:47 PM

## 2018-10-20 LAB — GC/CHLAMYDIA PROBE AMP (~~LOC~~) NOT AT ARMC
Chlamydia: NEGATIVE
Neisseria Gonorrhea: NEGATIVE

## 2018-10-24 ENCOUNTER — Encounter (HOSPITAL_COMMUNITY): Payer: Self-pay

## 2018-10-24 ENCOUNTER — Inpatient Hospital Stay (HOSPITAL_COMMUNITY)
Admission: AD | Admit: 2018-10-24 | Discharge: 2018-10-24 | Disposition: A | Discharge status: Home / Self Care | Payer: Self-pay | Attending: Obstetrics and Gynecology | Admitting: Obstetrics and Gynecology

## 2018-10-24 ENCOUNTER — Other Ambulatory Visit: Payer: Self-pay

## 2018-10-24 ENCOUNTER — Inpatient Hospital Stay (HOSPITAL_COMMUNITY): Payer: Self-pay

## 2018-10-24 DIAGNOSIS — Z3A01 Less than 8 weeks gestation of pregnancy: Secondary | ICD-10-CM | POA: Insufficient documentation

## 2018-10-24 DIAGNOSIS — Z3491 Encounter for supervision of normal pregnancy, unspecified, first trimester: Secondary | ICD-10-CM

## 2018-10-24 DIAGNOSIS — R109 Unspecified abdominal pain: Secondary | ICD-10-CM | POA: Insufficient documentation

## 2018-10-24 DIAGNOSIS — Z87891 Personal history of nicotine dependence: Secondary | ICD-10-CM | POA: Insufficient documentation

## 2018-10-24 DIAGNOSIS — N96 Recurrent pregnancy loss: Secondary | ICD-10-CM

## 2018-10-24 DIAGNOSIS — O26891 Other specified pregnancy related conditions, first trimester: Secondary | ICD-10-CM | POA: Insufficient documentation

## 2018-10-24 LAB — URINALYSIS, ROUTINE W REFLEX MICROSCOPIC
Bilirubin Urine: NEGATIVE
Glucose, UA: NEGATIVE mg/dL
Hgb urine dipstick: NEGATIVE
Ketones, ur: NEGATIVE mg/dL
Leukocytes,Ua: NEGATIVE
Nitrite: NEGATIVE
Protein, ur: NEGATIVE mg/dL
Specific Gravity, Urine: 1.023 (ref 1.005–1.030)
pH: 5 (ref 5.0–8.0)

## 2018-10-24 NOTE — MAU Provider Note (Signed)
Chief Complaint: Abdominal Pain   None     SUBJECTIVE HPI: Alexis Ellis is a 28 y.o. G5P0040 at [redacted]w[redacted]d by LMP who presents to maternity admissions reporting abdominal cramping in the front of her abdomen x 3 days. She has hx of right ectopic with salpingectomy and miscarriage x 2 with no pregnancies continuing past 5-6 weeks.  Her pain is mild but persistent, intermittent pain in the front of her abdomen that is cramping pain and does not radiate.  She has taken Tylenol which helps some.  There are no other symptoms.  She denies vaginal bleeding, vaginal itching/burning, urinary symptoms, h/a, dizziness, n/v, or fever/chills.     HPI  Past Medical History:  Diagnosis Date  . ADD (attention deficit disorder)   . Chicken pox   . Depression   . Ectopic pregnancy   . Elevated blood pressure   . Elevated cholesterol   . Frequent headaches   . Frequent headaches   . History of kidney stones    Past Surgical History:  Procedure Laterality Date  . LAPAROSCOPY N/A 09/16/2012   Procedure: LAPAROSCOPY OPERATIVE;  Surgeon: Allena Katz, MD;  Location: [facility];  Service: Gynecology;  Laterality: N/A;  Evacuation Hemoperitoneum  . UNILATERAL SALPINGECTOMY Right 09/16/2012   Procedure: UNILATERAL SALPINGECTOMY;  Surgeon: Allena Katz, MD;  Location: [facility];  Service: Gynecology;  Laterality: Right;  . WISDOM TOOTH EXTRACTION     Social History   Socioeconomic History  . Marital status: Single    Spouse name: Not on file  . Number of children: Not on file  . Years of education: Not on file  . Highest education level: Not on file  Occupational History  . Not on file  Social Needs  . Financial resource strain: Not on file  . Food insecurity    Worry: Not on file    Inability: Not on file  . Transportation needs    Medical: Not on file    Non-medical: Not on file  Tobacco Use  . Smoking status: Former Smoker    Packs/day: 0.00  . Smokeless tobacco: Never Used  Substance and  Sexual Activity  . Alcohol use: Not Currently    Alcohol/week: 0.0 standard drinks    Comment: social  . Drug use: No  . Sexual activity: Yes    Birth control/protection: None  Lifestyle  . Physical activity    Days per week: Not on file    Minutes per session: Not on file  . Stress: Not on file  Relationships  . Social Herbalist on phone: Not on file    Gets together: Not on file    Attends religious service: Not on file    Active member of club or organization: Not on file    Attends meetings of clubs or organizations: Not on file    Relationship status: Not on file  . Intimate partner violence    Fear of current or ex partner: Not on file    Emotionally abused: Not on file    Physically abused: Not on file    Forced sexual activity: Not on file  Other Topics Concern  . Not on file  Social History Narrative  . Not on file   No current facility-administered medications on file prior to encounter.    Current Outpatient Medications on File Prior to Encounter  Medication Sig Dispense Refill  . amphetamine-dextroamphetamine (ADDERALL) 20 MG tablet 1 tablet 3 times daily  0  . FLUoxetine (PROZAC) 40 MG capsule Take 40 mg by mouth 2 (two) times daily.    . fluticasone (FLONASE) 50 MCG/ACT nasal spray Place 2 sprays into both nostrils daily as needed.   0  . promethazine (PHENERGAN) 12.5 MG tablet Take 1 tablet (12.5 mg total) by mouth every 6 (six) hours as needed for nausea or vomiting. 30 tablet 0   No Known Allergies  ROS:  Review of Systems  Constitutional: Negative for chills, fatigue and fever.  Eyes: Negative for visual disturbance.  Respiratory: Negative for shortness of breath.   Cardiovascular: Negative for chest pain.  Gastrointestinal: Positive for abdominal pain. Negative for nausea and vomiting.  Genitourinary: Positive for pelvic pain. Negative for difficulty urinating, dysuria, flank pain, vaginal bleeding, vaginal discharge and vaginal pain.   Neurological: Negative for dizziness and headaches.  Psychiatric/Behavioral: Negative.      I have reviewed patient's Past Medical Hx, Surgical Hx, Family Hx, Social Hx, medications and allergies.   Physical Exam   Patient Vitals for the past 24 hrs:  BP Temp Temp src Pulse Resp SpO2 Height Weight  10/24/18 1510 139/76 - - - - - - -  10/24/18 1447 (!) 142/86 98.1 F (36.7 C) Oral 99 20 - 5\' 5"  (1.651 m) 109.4 kg  10/24/18 1445 - - - - - 100 % - -   Constitutional: Well-developed, well-nourished female in no acute distress.  Cardiovascular: normal rate Respiratory: normal effort GI: Abd soft, non-tender. Pos BS x 4 MS: Extremities nontender, no edema, normal ROM Neurologic: Alert and oriented x 4.  GU: Neg CVAT.  PELVIC EXAM: Deferred     LAB RESULTS Results for orders placed or performed during the hospital encounter of 10/24/18 (from the past 24 hour(s))  Urinalysis, Routine w reflex microscopic     Status: Abnormal   Collection Time: 10/24/18  4:42 PM  Result Value Ref Range   Color, Urine YELLOW YELLOW   APPearance HAZY (A) CLEAR   Specific Gravity, Urine 1.023 1.005 - 1.030   pH 5.0 5.0 - 8.0   Glucose, UA NEGATIVE NEGATIVE mg/dL   Hgb urine dipstick NEGATIVE NEGATIVE   Bilirubin Urine NEGATIVE NEGATIVE   Ketones, ur NEGATIVE NEGATIVE mg/dL   Protein, ur NEGATIVE NEGATIVE mg/dL   Nitrite NEGATIVE NEGATIVE   Leukocytes,Ua NEGATIVE NEGATIVE       IMAGING US Ob Transvaginal  Result Date: 10/24/2018 CLINICAL DATA:  Abdominal pain.  Pregnant patient. EXAM: TRANSVAGINAL OB ULTRASOUND TECHNIQUE: Transvaginal ultrasound was performed for complete evaluation of the gestation as well as the maternal uterus, adnexal regions, and pelvic cul-de-sac. COMPARISON:  12/26/2017. FINDINGS: Intrauterine gestational sac: Single Yolk sac:  Visualized. Embryo:  Visualized. Cardiac Activity: Visualized. Heart Rate: 110 bpm MSD: 17.58 mm   6 w   1 d CRL:   2.9 mm   5 w 5 d                   [place] EDC: 06/21/2019 Subchorionic hemorrhage:  None visualized. Maternal uterus/adnexae: No uterine masses. Cervix is unremarkable. Normal ovaries. No adnexal masses. No pelvic free fluid. IMPRESSION: 1. Single live intrauterine pregnancy with a measured gestational age of [redacted] weeks and 5 days. No acute findings or pregnancy complication. Electronically Signed   By: Lajean Manes M.D.   On: 10/24/2018 16:09   US Ob Less Than 14 Weeks With Ob Transvaginal  Result Date: 10/19/2018 CLINICAL DATA:  Pelvic pain.  Pregnant patient. EXAM: OBSTETRIC <14 WK [place]  AND TRANSVAGINAL OB US TECHNIQUE: Both transabdominal and transvaginal ultrasound examinations were performed for complete evaluation of the gestation as well as the maternal uterus, adnexal regions, and pelvic cul-de-sac. Transvaginal technique was performed to assess early pregnancy. COMPARISON:  None. FINDINGS: Intrauterine gestational sac: Single Yolk sac:  Visualized. Embryo:  Visualized. Cardiac Activity: Visualized. Heart Rate: Could not measured due to small fetal size. MSD:   mm    w     d CRL:  2.61 mm   5 w   5 d                  [place] EDC: June 16, 2019 Subchorionic hemorrhage:  None visualized. Maternal uterus/adnexae: Normal. IMPRESSION: Single live IUP.  No cause for pain identified. Electronically Signed   By: Dorise Bullion III M.D   On: 10/19/2018 16:48    MAU Management/MDM: Orders Placed This Encounter  Procedures  . US OB Transvaginal  . US OB Transvaginal  . Urinalysis, Routine w reflex microscopic  . Discharge patient    No orders of the defined types were placed in this encounter.   Today's US reveals IUP with appropriate growth from 10/19/18.  Reassurance provided. Pt worried with hx previous losses x 3, all first trimester.  Outpatient [place] at [facility] in 2 weeks the new OB with provider of her choice, message sent to establish care at [facility].  Pt discharged with strict return precautions.  ASSESSMENT 1. Normal IUP (intrauterine  pregnancy) on prenatal ultrasound, first trimester   2. History of multiple miscarriages   3. Abdominal pain during pregnancy in first trimester     PLAN Discharge home Allergies as of 10/24/2018   No Known Allergies     Medication List    STOP taking these medications   amphetamine-dextroamphetamine 20 MG tablet Commonly known as: ADDERALL     TAKE these medications   FLUoxetine 40 MG capsule Commonly known as: PROZAC Take 40 mg by mouth 2 (two) times daily.   fluticasone 50 MCG/ACT nasal spray Commonly known as: FLONASE Place 2 sprays into both nostrils daily as needed.   promethazine 12.5 MG tablet Commonly known as: PHENERGAN Take 1 tablet (12.5 mg total) by mouth every 6 (six) hours as needed for nausea or vomiting.      Follow-up Information    [place] Follow up.   Why: The office will call you or call the number listed to make appointment. Return to MAU as needed for emergencies. Contact information: [address] [place] [address] [id number] [place] Certified Nurse-Midwife 10/24/2018  5:23 PM

## 2018-10-24 NOTE — MAU Note (Addendum)
Pt having persistant cramping during this pregnancy, has tried Tylenol.  Pain is 5/10No bleeding. Has not had a successful pregnancy and is very nervous and tearful. Pt states she "just doesn't feel pregnant anymore and feel like I'm losing weight."

## 2018-11-05 ENCOUNTER — Other Ambulatory Visit: Payer: Self-pay

## 2018-11-05 ENCOUNTER — Ambulatory Visit (INDEPENDENT_AMBULATORY_CARE_PROVIDER_SITE_OTHER): Payer: Self-pay

## 2018-11-05 ENCOUNTER — Ambulatory Visit (HOSPITAL_COMMUNITY)
Admission: RE | Admit: 2018-11-05 | Discharge: 2018-11-05 | Disposition: A | Discharge status: Home / Self Care | Payer: Self-pay | Source: Ambulatory Visit | Attending: Obstetrics and Gynecology | Admitting: Obstetrics and Gynecology

## 2018-11-05 DIAGNOSIS — N96 Recurrent pregnancy loss: Secondary | ICD-10-CM | POA: Insufficient documentation

## 2018-11-05 DIAGNOSIS — R109 Unspecified abdominal pain: Secondary | ICD-10-CM | POA: Insufficient documentation

## 2018-11-05 DIAGNOSIS — O26891 Other specified pregnancy related conditions, first trimester: Secondary | ICD-10-CM | POA: Insufficient documentation

## 2018-11-05 DIAGNOSIS — O3680X0 Pregnancy with inconclusive fetal viability, not applicable or unspecified: Secondary | ICD-10-CM

## 2018-11-05 NOTE — Progress Notes (Signed)
Pt here for [place] results/ reviewed with Dr. Kennon Rounds and she states everything looks good & for Pt to come back in 10 weeks or more.Pt has already started Prenatal vitamins. Pt verbalized understanding.

## 2018-11-05 NOTE — Progress Notes (Signed)
Patient seen and assessed by nursing staff.  Agree with documentation and plan.  

## 2018-11-30 ENCOUNTER — Other Ambulatory Visit: Payer: Self-pay

## 2018-11-30 ENCOUNTER — Other Ambulatory Visit (HOSPITAL_COMMUNITY)
Admission: RE | Admit: 2018-11-30 | Discharge: 2018-11-30 | Disposition: A | Discharge status: Home / Self Care | Payer: Medicaid Other | Source: Ambulatory Visit | Attending: Advanced Practice Midwife | Admitting: Advanced Practice Midwife

## 2018-11-30 ENCOUNTER — Encounter: Payer: Self-pay | Admitting: Advanced Practice Midwife

## 2018-11-30 ENCOUNTER — Ambulatory Visit (INDEPENDENT_AMBULATORY_CARE_PROVIDER_SITE_OTHER): Payer: Self-pay | Admitting: Advanced Practice Midwife

## 2018-11-30 VITALS — BP 129/84 | HR 103 | Wt 242.0 lb

## 2018-11-30 DIAGNOSIS — O09291 Supervision of pregnancy with other poor reproductive or obstetric history, first trimester: Secondary | ICD-10-CM

## 2018-11-30 DIAGNOSIS — Z3490 Encounter for supervision of normal pregnancy, unspecified, unspecified trimester: Secondary | ICD-10-CM | POA: Diagnosis present

## 2018-11-30 DIAGNOSIS — O0990 Supervision of high risk pregnancy, unspecified, unspecified trimester: Secondary | ICD-10-CM | POA: Insufficient documentation

## 2018-11-30 DIAGNOSIS — Z3A11 11 weeks gestation of pregnancy: Secondary | ICD-10-CM

## 2018-11-30 DIAGNOSIS — Z8759 Personal history of other complications of pregnancy, childbirth and the puerperium: Secondary | ICD-10-CM | POA: Insufficient documentation

## 2018-11-30 DIAGNOSIS — Z205 Contact with and (suspected) exposure to viral hepatitis: Secondary | ICD-10-CM

## 2018-11-30 DIAGNOSIS — N96 Recurrent pregnancy loss: Secondary | ICD-10-CM | POA: Insufficient documentation

## 2018-11-30 MED ORDER — BLOOD PRESSURE MONITOR KIT: 1.0000 | PACK | 0 refills | Status: DC

## 2018-11-30 NOTE — Progress Notes (Signed)
NOB Genetic Screening: Desires Last pap; 2015 per pt WNL  GC/CT on 10/19/18 : NEGATIVE   CC: Swelling in feet and Nausea and vomiting.   *FOB has Hep C pt wants to be tested.   *Pt notes hard of hearing in right ear*  FHT not heard will need CNM assistance

## 2018-11-30 NOTE — Progress Notes (Signed)
Subjective:   Alexis Ellis is a 28 y.o. G5P0040 at 95w2dby LMP being seen today for her first obstetrical visit.  Her obstetrical history is significant for SAB x 3, ectopic x 1 with salpingectomy 2015 and has ADD (attention deficit disorder); Anxiety and depression; Tension headache; HLD (hyperlipidemia); Obesity (BMI 35.0-39.9 without comorbidity); Supervision of normal pregnancy, antepartum; and History of ectopic pregnancy on their problem list.. Patient does intend to breast feed. Pregnancy history fully reviewed.  Patient reports nausea.  HISTORY: OB History  Gravida Para Term Preterm AB Living  5 0 0 0 4 0  SAB TAB Ectopic Multiple Live Births  3 0 1 0 0    # Outcome Date GA Lbr Len/2nd Weight Sex Delivery Anes PTL Lv  5 Current           4 Ectopic 2015             Birth Comments: System Generated. Please review and update pregnancy details.     Complications: H/O unilateral salpingectomy  3 SAB           2 SAB           1 SAB            Past Medical History:  Diagnosis Date  . ADD (attention deficit disorder)   . Chicken pox   . Depression   . Ectopic pregnancy   . Elevated blood pressure   . Elevated cholesterol   . Frequent headaches   . Frequent headaches   . History of kidney stones    Past Surgical History:  Procedure Laterality Date  . LAPAROSCOPY N/A 09/16/2012   Procedure: LAPAROSCOPY OPERATIVE;  Surgeon: JAllena Katz MD;  [facility];  Service: Gynecology;  Laterality: N/A;  Evacuation Hemoperitoneum  . UNILATERAL SALPINGECTOMY Right 09/16/2012   Procedure: UNILATERAL SALPINGECTOMY;  Surgeon: JAllena Katz MD;  [facility];  Service: Gynecology;  Laterality: Right;  . WISDOM TOOTH EXTRACTION     Family History  Problem Relation Age of Onset  . Hypertension Mother        Living  . Fibromyalgia Mother   . Diabetes Father 467      Deceased  . Heart attack Father   . Sudden death Father   . Bipolar disorder Maternal  Grandmother   . Diabetes Maternal Grandfather   . Alcohol abuse Paternal Grandfather   . Liver disease Paternal Grandfather   . Hyperlipidemia Paternal Grandmother   . Kidney disease Paternal Grandmother   . Liver disease Paternal Grandmother   . Heart disease Paternal Grandmother   . Healthy Sister    Social History   Tobacco Use  . Smoking status: Former Smoker    Packs/day: 0.00  . Smokeless tobacco: Never Used  Substance Use Topics  . Alcohol use: Not Currently    Alcohol/week: 0.0 standard drinks    Comment: social  . Drug use: No   No Known Allergies Current Outpatient Medications on File Prior to Visit  Medication Sig Dispense Refill  . acetaminophen (TYLENOL) 325 MG tablet Take 650 mg by mouth every 6 (six) hours as needed.    .Marland KitchenFLUoxetine (PROZAC) 40 MG capsule Take 40 mg by mouth 2 (two) times daily.    . fluticasone (FLONASE) 50 MCG/ACT nasal spray Place 2 sprays into both nostrils daily as needed.   0  . promethazine (PHENERGAN) 12.5 MG tablet Take 1 tablet (12.5 mg total) by  mouth every 6 (six) hours as needed for nausea or vomiting. (Patient not taking: Reported on 11/30/2018) 30 tablet 0   No current facility-administered medications on file prior to visit.      Exam   Vitals:   11/30/18 0833  BP: 129/84  Pulse: (!) 103  Weight: 242 lb (109.8 kg)      Uterus:     Pelvic Exam: Perineum: no hemorrhoids, normal perineum   Vulva: normal external genitalia, no lesions   Vagina:  normal mucosa, normal discharge   Cervix: no lesions and normal, pap smear done.    Adnexa: normal adnexa and no mass, fullness, tenderness   Bony Pelvis: average  System: General: well-developed, well-nourished female in no acute distress   Breast:  normal appearance, no masses or tenderness   Skin: normal coloration and turgor, no rashes   Neurologic: oriented, normal, negative, normal mood   Extremities: normal strength, tone, and muscle mass, ROM of all joints is normal    HEENT PERRLA, extraocular movement intact and sclera clear, anicteric   Mouth/Teeth mucous membranes moist, pharynx normal without lesions and dental hygiene good   Neck supple and no masses   Cardiovascular: regular rate and rhythm   Respiratory:  no respiratory distress, normal breath sounds   Abdomen: soft, non-tender; bowel sounds normal; no masses,  no organomegaly     Assessment:   Pregnancy: G5P0040 Patient Active Problem List   Diagnosis Date Noted  . Supervision of normal pregnancy, antepartum 11/30/2018  . History of ectopic pregnancy 11/30/2018  . Tension headache 09/29/2015  . HLD (hyperlipidemia) 09/29/2015  . Obesity (BMI 35.0-39.9 without comorbidity) 09/29/2015  . ADD (attention deficit disorder) 11/07/2013  . Anxiety and depression 11/07/2013     Plan:  1. Encounter for supervision of normal pregnancy, antepartum, unspecified gravidity --Anticipatory guidance about next visits/weeks of pregnancy given.  --Reviewed safety, visitor policy, and preprocedure COVID testing for IOL or scheduled C/S.  Reassurance about COVID-19 with young healthy women according to current data. Discussed possible changes to visits, including televisits, that may occur due to COVID-19.  The office remains open if pt needs to be seen and MAU is open 24 hours/day for OB emergencies. --Pt was interested in [facility] before the facility closed and is interested in natural birth, waterbirth if it becomes available again.  Pt is midwife preferred for office visits and in labor.  Discussed birth plan options with pt, desires for low intervention birth, and questions answered today.  --FHT difficult to obtain today, bedside US shows active fetus and then doppler obtain FHT at 164.  Next visit in office for [place] due to hx miscarriage x 3/ectopic x 1.   - Obstetric Panel, Including HIV - Culture, OB Urine - Genetic Screening - Babyscripts Schedule Optimization - Cytology - PAP( [facility]) - Blood Pressure Monitor KIT; 1 Device by Does not apply route once a week. To be monitored Regularly at home.  Dispense: 1 kit; Refill: 0  2. Exposure to hepatitis C --Pt reports that FOB is Hep C positive and she desires testing. - Hepatitis C antibody    Initial labs drawn. Continue prenatal vitamins. Discussed and offered genetic screening options, including Quad screen/AFP, NIPS testing, and option to decline testing. Benefits/risks/alternatives reviewed. Pt aware that anatomy US is form of genetic screening with lower accuracy in detecting trisomies than blood work.  Pt chooses/declines genetic screening today. NIPS: ordered. Ultrasound discussed; fetal anatomic survey: ordered. Problem list reviewed and updated.  The nature of [address] with multiple MDs and other Advanced Practice Providers was explained to patient; also emphasized that residents, students are part of our team. Routine obstetric precautions reviewed. Return in about 4 weeks (around 12/28/2018).   Fatima Blank, CNM 11/30/18 9:53 AM

## 2018-11-30 NOTE — Patient Instructions (Signed)

## 2018-11-30 NOTE — Addendum Note (Signed)
Addended by: Courtney Heys on: 11/30/2018 01:25 PM   Modules accepted: Orders

## 2018-12-01 ENCOUNTER — Other Ambulatory Visit: Payer: Self-pay | Admitting: Advanced Practice Midwife

## 2018-12-01 LAB — OBSTETRIC PANEL, INCLUDING HIV
Antibody Screen: NEGATIVE
Basophils Absolute: 0 10*3/uL (ref 0.0–0.2)
Basos: 1 %
EOS (ABSOLUTE): 0.1 10*3/uL (ref 0.0–0.4)
Eos: 2 %
HIV Screen 4th Generation wRfx: NONREACTIVE
Hematocrit: 38 % (ref 34.0–46.6)
Hemoglobin: 13.1 g/dL (ref 11.1–15.9)
Hepatitis B Surface Ag: NEGATIVE
Immature Grans (Abs): 0 10*3/uL (ref 0.0–0.1)
Immature Granulocytes: 1 %
Lymphocytes Absolute: 2.3 10*3/uL (ref 0.7–3.1)
Lymphs: 28 %
MCH: 30.5 pg (ref 26.6–33.0)
MCHC: 34.5 g/dL (ref 31.5–35.7)
MCV: 89 fL (ref 79–97)
Monocytes Absolute: 0.5 10*3/uL (ref 0.1–0.9)
Monocytes: 6 %
Neutrophils Absolute: 5.1 10*3/uL (ref 1.4–7.0)
Neutrophils: 62 %
Platelets: 254 10*3/uL (ref 150–450)
RBC: 4.29 x10E6/uL (ref 3.77–5.28)
RDW: 12 % (ref 11.7–15.4)
RPR Ser Ql: NONREACTIVE
Rh Factor: POSITIVE
Rubella Antibodies, IGG: 15.9 index (ref >0.99)
WBC: 8.1 10*3/uL (ref 3.4–10.8)

## 2018-12-01 LAB — HEPATITIS C ANTIBODY: Hep C Virus Ab: <0.1 s/co ratio (ref 0.0–0.9)

## 2018-12-01 MED ORDER — ONDANSETRON HCL 4 MG PO TABS: 4.0000 mg | ORAL_TABLET | Freq: Three times a day (TID) | ORAL | 0 refills | Status: DC | PRN

## 2018-12-01 MED FILL — ONDANSETRON HCL 4 MG TABLET: 4 | 6 days supply | Qty: 20 | Fill #0

## 2018-12-02 ENCOUNTER — Other Ambulatory Visit: Payer: Self-pay

## 2018-12-02 LAB — CYTOLOGY - PAP

## 2018-12-02 MED ORDER — ONDANSETRON HCL 4 MG PO TABS: 4.0000 mg | ORAL_TABLET | Freq: Three times a day (TID) | ORAL | 0 refills | Status: DC | PRN

## 2018-12-05 ENCOUNTER — Encounter: Payer: Self-pay | Admitting: Advanced Practice Midwife

## 2018-12-05 DIAGNOSIS — R87619 Unspecified abnormal cytological findings in specimens from cervix uteri: Secondary | ICD-10-CM | POA: Insufficient documentation

## 2018-12-05 LAB — URINE CULTURE, OB REFLEX

## 2018-12-05 LAB — CULTURE, OB URINE

## 2018-12-09 ENCOUNTER — Encounter: Payer: Self-pay | Admitting: Advanced Practice Midwife

## 2018-12-10 ENCOUNTER — Encounter: Payer: Self-pay | Admitting: Advanced Practice Midwife

## 2018-12-11 ENCOUNTER — Encounter: Payer: Self-pay | Admitting: Advanced Practice Midwife

## 2018-12-14 ENCOUNTER — Telehealth: Payer: Self-pay | Admitting: Advanced Practice Midwife

## 2018-12-14 NOTE — Telephone Encounter (Signed)
Called Alexis Ellis to discuss her recent abnormal Pap results.  Abnormal Pap warrants further investigation but is not an indication of cervical cancer. Reassurance provided.  Discussed plan for colpo postpartum.  Pt reports she had an abnormal Pap 4-5 years ago and thinks she had a colpo with [facility] at that time.  Pt to fill out request at her office visit this month to get records of Pap and colpo.  Pt to contact office with any additional questions/concerns.

## 2018-12-28 ENCOUNTER — Encounter: Payer: Self-pay | Admitting: Obstetrics and Gynecology

## 2018-12-28 ENCOUNTER — Ambulatory Visit (INDEPENDENT_AMBULATORY_CARE_PROVIDER_SITE_OTHER): Payer: Medicaid Other | Admitting: Obstetrics and Gynecology

## 2018-12-28 ENCOUNTER — Other Ambulatory Visit: Payer: Self-pay

## 2018-12-28 VITALS — BP 123/82 | HR 103 | Wt 243.0 lb

## 2018-12-28 DIAGNOSIS — R87612 Low grade squamous intraepithelial lesion on cytologic smear of cervix (LGSIL): Secondary | ICD-10-CM

## 2018-12-28 DIAGNOSIS — Z3A15 15 weeks gestation of pregnancy: Secondary | ICD-10-CM

## 2018-12-28 DIAGNOSIS — Z3400 Encounter for supervision of normal first pregnancy, unspecified trimester: Secondary | ICD-10-CM

## 2018-12-28 DIAGNOSIS — Z3482 Encounter for supervision of other normal pregnancy, second trimester: Secondary | ICD-10-CM | POA: Diagnosis not present

## 2018-12-28 DIAGNOSIS — O99212 Obesity complicating pregnancy, second trimester: Secondary | ICD-10-CM

## 2018-12-28 DIAGNOSIS — O99210 Obesity complicating pregnancy, unspecified trimester: Secondary | ICD-10-CM | POA: Insufficient documentation

## 2018-12-28 MED ORDER — SALINE SPRAY 0.65 % NA SOLN: 1.0000 | NASAL | 3 refills | Status: DC | PRN

## 2018-12-28 MED ORDER — ASPIRIN EC 81 MG PO TBEC: 81.0000 mg | DELAYED_RELEASE_TABLET | Freq: Every day | ORAL | 2 refills | Status: DC

## 2018-12-28 NOTE — Progress Notes (Signed)
Patient is unsure if she is feeling fetal movement yet, requests rx for nasal spray.

## 2018-12-28 NOTE — Progress Notes (Signed)
   PRENATAL VISIT NOTE  Subjective:  Alexis Ellis is a 28 y.o. [id number] at [redacted]w[redacted]d being seen today for ongoing prenatal care.  She is currently monitored for the following issues for this low-risk pregnancy and has ADD (attention deficit disorder); Anxiety and depression; Tension headache; HLD (hyperlipidemia); Obesity (BMI 35.0-39.9 without comorbidity); Supervision of normal pregnancy, antepartum; History of ectopic pregnancy; History of multiple miscarriages; Abnormal Pap smear of cervix; and Maternal obesity affecting pregnancy, antepartum on their problem list.  Patient reports nasal congestion.  Contractions: Not present. Vag. Bleeding: None.   . Denies leaking of fluid.   The following portions of the patient's history were reviewed and updated as appropriate: allergies, current medications, past family history, past medical history, past social history, past surgical history and problem list.   Objective:   Vitals:   12/28/18 0825  BP: 123/82  Pulse: (!) 103  Weight: 243 lb (110.2 kg)    Fetal Status: Fetal Heart Rate (bpm): 160         General:  Alert, oriented and cooperative. Patient is in no acute distress.  Skin: Skin is warm and dry. No rash noted.   Cardiovascular: Normal heart rate noted  Respiratory: Normal respiratory effort, no problems with respiration noted  Abdomen: Soft, gravid, appropriate for gestational age.  Pain/Pressure: Absent     Pelvic: Cervical exam deferred        Extremities: Normal range of motion.  Edema: Trace  Mental Status: Normal mood and affect. Normal behavior. Normal judgment and thought content.   Assessment and Plan:  Pregnancy: [id number] at [redacted]w[redacted]d 1. Supervision of normal first pregnancy, antepartum Patient is doing well Rx saline nasal spray provided Anatomy ultrasound scheduled AFP today - AFP, Serum, Open Spina Bifida - [place] MFM OB COMP + 14 WK; Future  2. Low grade squamous intraepithelial lesion on cytologic smear of cervix  (LGSIL) Colposcopy postpartum per patient request  3. Maternal obesity affecting pregnancy, antepartum Rx ASA provided  Preterm labor symptoms and general obstetric precautions including but not limited to vaginal bleeding, contractions, leaking of fluid and fetal movement were reviewed in detail with the patient. Please refer to After Visit Summary for other counseling recommendations.   Return in about 4 weeks (around 01/25/2019) for [facility], [place], Low risk.  Future Appointments  Date Time Provider [place]  01/21/2019  9:30 AM [facility] [place] 1 WH-[facility]US [facility]  01/25/2019  2:00 PM Shelly Bombard, MD [facility] None    Mora Bellman, MD

## 2018-12-30 LAB — AFP, SERUM, OPEN SPINA BIFIDA
AFP MoM: 0.8
AFP Value: 17.5 ng/mL
Gest. Age on Collection Date: 15.2 weeks
Maternal Age At EDD: 30.5 yr
OSBR Risk 1 IN: 10000
Test Results:: NEGATIVE
Weight: 243 [lb_av]

## 2019-01-01 DIAGNOSIS — Z136 Encounter for screening for cardiovascular disorders: Secondary | ICD-10-CM | POA: Diagnosis not present

## 2019-01-21 ENCOUNTER — Ambulatory Visit (HOSPITAL_COMMUNITY)
Admission: RE | Admit: 2019-01-21 | Discharge: 2019-01-21 | Disposition: A | Discharge status: Home / Self Care | Payer: Medicaid Other | Source: Ambulatory Visit | Attending: Obstetrics and Gynecology | Admitting: Obstetrics and Gynecology

## 2019-01-21 ENCOUNTER — Other Ambulatory Visit: Payer: Self-pay

## 2019-01-21 ENCOUNTER — Other Ambulatory Visit: Payer: Self-pay | Admitting: Obstetrics and Gynecology

## 2019-01-21 DIAGNOSIS — E669 Obesity, unspecified: Secondary | ICD-10-CM

## 2019-01-21 DIAGNOSIS — Z363 Encounter for antenatal screening for malformations: Secondary | ICD-10-CM

## 2019-01-21 DIAGNOSIS — Z3400 Encounter for supervision of normal first pregnancy, unspecified trimester: Secondary | ICD-10-CM | POA: Insufficient documentation

## 2019-01-21 DIAGNOSIS — Z3A18 18 weeks gestation of pregnancy: Secondary | ICD-10-CM | POA: Diagnosis not present

## 2019-01-21 DIAGNOSIS — O2622 Pregnancy care for patient with recurrent pregnancy loss, second trimester: Secondary | ICD-10-CM | POA: Diagnosis not present

## 2019-01-21 DIAGNOSIS — O99212 Obesity complicating pregnancy, second trimester: Secondary | ICD-10-CM | POA: Diagnosis not present

## 2019-01-22 ENCOUNTER — Other Ambulatory Visit (HOSPITAL_COMMUNITY): Payer: Self-pay | Admitting: *Deleted

## 2019-01-22 DIAGNOSIS — O99210 Obesity complicating pregnancy, unspecified trimester: Secondary | ICD-10-CM

## 2019-01-25 ENCOUNTER — Encounter: Payer: Self-pay | Admitting: Obstetrics

## 2019-01-25 ENCOUNTER — Telehealth (INDEPENDENT_AMBULATORY_CARE_PROVIDER_SITE_OTHER): Payer: Medicaid Other | Admitting: Obstetrics

## 2019-01-25 VITALS — BP 152/96 | HR 92

## 2019-01-25 DIAGNOSIS — O0990 Supervision of high risk pregnancy, unspecified, unspecified trimester: Secondary | ICD-10-CM

## 2019-01-25 DIAGNOSIS — R87612 Low grade squamous intraepithelial lesion on cytologic smear of cervix (LGSIL): Secondary | ICD-10-CM

## 2019-01-25 DIAGNOSIS — R030 Elevated blood-pressure reading, without diagnosis of hypertension: Secondary | ICD-10-CM | POA: Diagnosis not present

## 2019-01-25 DIAGNOSIS — Z3A19 19 weeks gestation of pregnancy: Secondary | ICD-10-CM

## 2019-01-25 DIAGNOSIS — O99210 Obesity complicating pregnancy, unspecified trimester: Secondary | ICD-10-CM

## 2019-01-25 DIAGNOSIS — N96 Recurrent pregnancy loss: Secondary | ICD-10-CM | POA: Diagnosis not present

## 2019-01-25 DIAGNOSIS — O0992 Supervision of high risk pregnancy, unspecified, second trimester: Secondary | ICD-10-CM

## 2019-01-25 DIAGNOSIS — O99212 Obesity complicating pregnancy, second trimester: Secondary | ICD-10-CM

## 2019-01-25 NOTE — Progress Notes (Signed)
   TELEHEALTH VIRTUAL OBSTETRICS VISIT ENCOUNTER NOTE  I connected with Alexis Ellis on 01/25/19 at  2:00 PM EDT by telephone at home and verified that I am speaking with the correct person using two identifiers.   I discussed the limitations, risks, security and privacy concerns of performing an evaluation and management service by telephone and the availability of in person appointments. I also discussed with the patient that there may be a patient responsible charge related to this service. The patient expressed understanding and agreed to proceed.  Subjective:  Alexis Ellis is a 28 y.o. [id number] at [redacted]w[redacted]d being followed for ongoing prenatal care.  She is currently monitored for the following issues for this high-risk pregnancy and has ADD (attention deficit disorder); Anxiety and depression; Tension headache; HLD (hyperlipidemia); Obesity (BMI 35.0-39.9 without comorbidity); Supervision of normal pregnancy, antepartum; History of ectopic pregnancy; History of multiple miscarriages; Abnormal Pap smear of cervix; and Maternal obesity affecting pregnancy, antepartum on their problem list.  Patient reports no complaints. Reports fetal movement. Denies any contractions, bleeding or leaking of fluid.   The following portions of the patient's history were reviewed and updated as appropriate: allergies, current medications, past family history, past medical history, past social history, past surgical history and problem list.   Objective:   General:  Alert, oriented and cooperative.   Mental Status: Normal mood and affect perceived. Normal judgment and thought content.  Rest of physical exam deferred due to type of encounter  Assessment and Plan:  Pregnancy: G5P0040 at [redacted]w[redacted]d  1. Supervision of high risk pregnancy, antepartum  2. History of multiple miscarriages - taking Baby ASA  3. Maternal obesity affecting pregnancy, antepartum  4. Low grade squamous intraepithelial lesion on  cytologic smear of cervix (LGSIL) - repeat pap 3-4 months postpartum  5. Elevated blood pressure reading without diagnosis of hypertension - repeat BP in 4 hours and call office if > 150/90 mm/hg   Preterm labor symptoms and general obstetric precautions including but not limited to vaginal bleeding, contractions, leaking of fluid and fetal movement were reviewed in detail with the patient.  I discussed the assessment and treatment plan with the patient. The patient was provided an opportunity to ask questions and all were answered. The patient agreed with the plan and demonstrated an understanding of the instructions. The patient was advised to call back or seek an in-person office evaluation/go to MAU at [facility] for any urgent or concerning symptoms. Please refer to After Visit Summary for other counseling recommendations.   I provided 10 minutes of non-face-to-face time during this encounter.  Return in about 4 weeks (around 02/22/2019) for MyChart.  Future Appointments  Date Time Provider [place]  02/18/2019  8:40 AM [place] [facility]  02/18/2019  8:45 AM [place] 2 WH-MFCUS [facility]-US    Baltazar Najjar, [place] for [facility], [place] [facility] 01/25/2019

## 2019-01-25 NOTE — Progress Notes (Signed)
S/w for mychart visit. Pt rports fetal movement, denies pain.

## 2019-02-16 ENCOUNTER — Other Ambulatory Visit: Payer: Self-pay

## 2019-02-16 ENCOUNTER — Inpatient Hospital Stay (HOSPITAL_COMMUNITY)
Admission: AD | Admit: 2019-02-16 | Discharge: 2019-02-16 | Disposition: A | Discharge status: Home / Self Care | Payer: Medicaid Other | Attending: Obstetrics and Gynecology | Admitting: Obstetrics and Gynecology

## 2019-02-16 ENCOUNTER — Encounter (HOSPITAL_COMMUNITY): Payer: Self-pay | Admitting: *Deleted

## 2019-02-16 DIAGNOSIS — O1202 Gestational edema, second trimester: Secondary | ICD-10-CM | POA: Insufficient documentation

## 2019-02-16 DIAGNOSIS — Z7982 Long term (current) use of aspirin: Secondary | ICD-10-CM | POA: Diagnosis not present

## 2019-02-16 DIAGNOSIS — Z3A22 22 weeks gestation of pregnancy: Secondary | ICD-10-CM

## 2019-02-16 DIAGNOSIS — Z87891 Personal history of nicotine dependence: Secondary | ICD-10-CM | POA: Insufficient documentation

## 2019-02-16 NOTE — MAU Note (Signed)
Pt fell off sidewalk on Sunday and landed on left ankle and leg. Pain in leg is 3/10. Did not land on abdomen.  Has pain radiating up leg.  Now has swelling in hands and legs that won't go down.  Denies LOF/VB/+FM

## 2019-02-16 NOTE — MAU Provider Note (Signed)
Chief Complaint: Edema   First Provider Initiated Contact with Patient 02/16/19 1111     SUBJECTIVE HPI: Alexis Ellis is a 28 y.o. [id number] at 32w3dwho presents to Maternity Admissions reporting swelling. Has noticed increased swelling in her hands & ankles since over the weekend. Has increased water intake & elevated her LE when she can without relief. Occasionally has had some pain & numbness in her hands. Denies headache, visual disturbance, or epigastric pain.  States she fell on Sunday. Her foot slipped off the edge of a sidewalk. Hit her left ankle & shin. Did not hit her abdomen. Denies abdominal pain, vaginal bleeding, or LOF. Reports mild discomfort in her left ankle. No problems with walking. States she is not here for evaluation of her fall, just worried about her swelling.     Past Medical History:  Diagnosis Date  . ADD (attention deficit disorder)   . Chicken pox   . Depression   . Ectopic pregnancy   . Elevated blood pressure   . Elevated cholesterol   . Frequent headaches   . History of kidney stones    OB History  Gravida Para Term Preterm AB Living  5       4    SAB TAB Ectopic Multiple Live Births  3   1        # Outcome Date GA Lbr Len/2nd Weight Sex Delivery Anes PTL Lv  5 Current           4 Ectopic 2015             Birth Comments: System Generated. Please review and update pregnancy details.     Complications: H/O unilateral salpingectomy  3 SAB           2 SAB           1 SAB            Past Surgical History:  Procedure Laterality Date  . LAPAROSCOPY N/A 09/16/2012   Procedure: LAPAROSCOPY OPERATIVE;  Surgeon: JAllena Katz MD;  [facility];  Service: Gynecology;  Laterality: N/A;  Evacuation Hemoperitoneum  . UNILATERAL SALPINGECTOMY Right 09/16/2012   Procedure: UNILATERAL SALPINGECTOMY;  Surgeon: JAllena Katz MD;  [facility];  Service: Gynecology;  Laterality: Right;  . WISDOM TOOTH EXTRACTION     Social History    Socioeconomic History  . Marital status: Single    Spouse name: Not on file  . Number of children: Not on file  . Years of education: Not on file  . Highest education level: Not on file  Occupational History  . Not on file  Social Needs  . Financial resource strain: Not on file  . Food insecurity    Worry: Not on file    Inability: Not on file  . Transportation needs    Medical: Not on file    Non-medical: Not on file  Tobacco Use  . Smoking status: Former Smoker    Packs/day: 0.00  . Smokeless tobacco: Never Used  Substance and Sexual Activity  . Alcohol use: Not Currently    Alcohol/week: 0.0 standard drinks    Comment: social  . Drug use: No  . Sexual activity: Yes    Partners: Male    Birth control/protection: None  Lifestyle  . Physical activity    Days per week: Not on file    Minutes per session: Not on file  . Stress: Not on file  Relationships  .  Social Herbalist on phone: Not on file    Gets together: Not on file    Attends religious service: Not on file    Active member of club or organization: Not on file    Attends meetings of clubs or organizations: Not on file    Relationship status: Not on file  . Intimate partner violence    Fear of current or ex partner: Not on file    Emotionally abused: Not on file    Physically abused: Not on file    Forced sexual activity: Not on file  Other Topics Concern  . Not on file  Social History Narrative  . Not on file   Family History  Problem Relation Age of Onset  . Hypertension Mother        Living  . Fibromyalgia Mother   . Diabetes Father 30       Deceased  . Heart attack Father   . Sudden death Father   . Bipolar disorder Maternal Grandmother   . Diabetes Maternal Grandfather   . Alcohol abuse Paternal Grandfather   . Liver disease Paternal Grandfather   . Hyperlipidemia Paternal Grandmother   . Kidney disease Paternal Grandmother   . Liver disease Paternal Grandmother   . Heart  disease Paternal Grandmother   . Healthy Sister    No current facility-administered medications on file prior to encounter.    Current Outpatient Medications on File Prior to Encounter  Medication Sig Dispense Refill  . acetaminophen (TYLENOL) 325 MG tablet Take 650 mg by mouth every 6 (six) hours as needed.    Marland Kitchen aspirin EC 81 MG tablet Take 1 tablet (81 mg total) by mouth daily. Take after 12 weeks for prevention of preeclampsia later in pregnancy 300 tablet 2  . Prenatal MV-Min-FA-Omega-3 (PRENATAL GUMMIES/DHA & FA) 0.4-32.5 MG CHEW Chew by mouth.    . Blood Pressure Monitor KIT 1 Device by Does not apply route once a week. To be monitored Regularly at home. 1 kit 0  . FLUoxetine (PROZAC) 40 MG capsule Take 40 mg by mouth 2 (two) times daily.    . fluticasone (FLONASE) 50 MCG/ACT nasal spray Place 2 sprays into both nostrils daily as needed.   0  . ondansetron (ZOFRAN) 4 MG tablet Take 1 tablet (4 mg total) by mouth every 8 (eight) hours as needed for nausea or vomiting. (Patient not taking: Reported on 12/28/2018) 20 tablet 0  . promethazine (PHENERGAN) 12.5 MG tablet Take 1 tablet (12.5 mg total) by mouth every 6 (six) hours as needed for nausea or vomiting. (Patient not taking: Reported on 11/30/2018) 30 tablet 0  . sodium chloride (OCEAN) 0.65 % SOLN nasal spray Place 1 spray into both nostrils as needed for congestion. 15 mL 3   No Known Allergies  I have reviewed patient's Past Medical Hx, Surgical Hx, Family Hx, Social Hx, medications and allergies.   Review of Systems  Constitutional: Negative.   Eyes: Negative for visual disturbance.  Cardiovascular: Positive for leg swelling.  Gastrointestinal: Negative.   Genitourinary: Negative.   Neurological: Negative for headaches.    OBJECTIVE Patient Vitals for the past 24 hrs:  BP Pulse Resp SpO2 Height Weight  02/16/19 1059 121/75 89 - 100 % - -  02/16/19 1034 129/73 - - - - -  02/16/19 1032 - 94 18 100 % 5' 5" (1.651 m) 114.6 kg    Constitutional: Well-developed, well-nourished female in no acute distress.  Cardiovascular: normal rate &  rhythm, no murmur Respiratory: normal rate and effort. Lung sounds clear throughout GI: Abd soft, non-tender, Pos BS x 4. No guarding or rebound tenderness MS: Extremities nontender, mild non pitting edema of BLE, mild swelling of bilateral fingers & hands, normal ROM Neurologic: Alert and oriented x 4.     LAB RESULTS No results found for this or any previous visit (from the past 24 hour(s)).  IMAGING No results found.  MAU COURSE Orders Placed This Encounter  Procedures  . Discharge patient   No orders of the defined types were placed in this encounter.   MDM FHT present via doppler Pt normotensive & no concerning signs r/t BPs.  Abdomen soft & non tender.  Mild swelling of BLE & BUE. Discussed interventions to help with swelling. Patient reassured. States she was mainly worried about BP & possibility of preeclampsia.   ASSESSMENT 1. Swelling of lower extremity during pregnancy in second trimester   2. [redacted] weeks gestation of pregnancy     PLAN Discharge home in stable condition. PEC & PTL precautions  Follow-up Information    [facility] Follow up.   Specialty: Obstetrics and Gynecology Why: return for worsening symptoms Contact information: [address] [id number] [place] [phone]         Allergies as of 02/16/2019   No Known Allergies     Medication List    STOP taking these medications   FLUoxetine 40 MG capsule Commonly known as: PROZAC   fluticasone 50 MCG/ACT nasal spray Commonly known as: FLONASE   ondansetron 4 MG tablet Commonly known as: Zofran   promethazine 12.5 MG tablet Commonly known as: PHENERGAN   sodium chloride 0.65 % Soln nasal spray Commonly known as: OCEAN     TAKE these medications   acetaminophen 325 MG tablet Commonly known as: TYLENOL Take 650  mg by mouth every 6 (six) hours as needed.   aspirin EC 81 MG tablet Take 1 tablet (81 mg total) by mouth daily. Take after 12 weeks for prevention of preeclampsia later in pregnancy   Blood Pressure Monitor Kit 1 Device by Does not apply route once a week. To be monitored Regularly at home.   Prenatal Gummies/DHA & FA 0.4-32.5 MG Chew Chew by mouth.        Jorje Guild, NP 02/16/2019  11:34 AM

## 2019-02-16 NOTE — Discharge Instructions (Signed)
Warning Signs During Pregnancy  A pregnancy lasts about 40 weeks, starting from the first day of your last period until the baby is born. Pregnancy is divided into three phases called trimesters.  · The first trimester refers to week 1 through week 13 of pregnancy.  · The second trimester is the start of week 14 through the end of week 27.  · The third trimester is the start of week 28 until you deliver your baby.  During each trimester of pregnancy, certain signs and symptoms may indicate a problem. Talk with your health care provider about your current health and any medical conditions you have. Make sure you know the symptoms that you should watch for and report.  How does this affect me?    Warning signs in the first trimester  While some changes during the first trimester may be uncomfortable, most do not represent a serious problem. Let your health care provider know if you have any of the following warning signs in the first trimester:  · You cannot eat or drink without vomiting, and this lasts for longer than a day.  · You have vaginal bleeding or spotting along with menstrual-like cramping.  · You have diarrhea for longer than a day.  · You have a fever or other signs of infection, such as:  ? Pain or burning when you urinate.  ? Foul smelling or thick or yellowish vaginal discharge.  Warning signs in the second trimester  As your baby grows and changes during the second trimester, there are additional signs and symptoms that may indicate a problem. These include:  · Signs and symptoms of infection, including a fever.  · Signs or symptoms of a miscarriage or preterm labor, such as regular contractions, menstrual-like cramping, or lower abdominal pain.  · Bloody or watery vaginal discharge or obvious vaginal bleeding.  · Feeling like your heart is pounding.  · Having trouble breathing.  · Nausea, vomiting, or diarrhea that lasts for longer than a day.  · Craving non-food items, such as clay, chalk, or dirt.  This may be a sign of a very treatable medical condition called pica.  Later in your second trimester, watch for signs and symptoms of a serious medical condition called preeclampsia.These include:  · Changes in your vision.  · A severe headache that does not go away.  · Nausea and vomiting.  It is also important to notice if your baby stops moving or moves less than usual during this time.  Warning signs in the third trimester  As you approach the third trimester, your baby is growing and your body is preparing for the birth of your baby. In your third trimester, be sure to let your health care provider know if:  · You have signs and symptoms of infection, including a fever.  · You have vaginal bleeding.  · You notice that your baby is moving less than usual or is not moving.  · You have nausea, vomiting, or diarrhea that lasts for longer than a day.  · You have a severe headache that does not go away.  · You have vision changes, including seeing spots or having blurry or double vision.  · You have increased swelling in your hands or face.  How does this affect my baby?  Throughout your pregnancy, always report any of the warning signs of a problem to your health care provider. This can help prevent complications that may affect your baby, including:  · Increased risk   Any of the following apply to you during any trimester of pregnancy: ? You have strong emotions, such as sadness or anxiety, that interfere with work or personal relationships. ? You feel unsafe in your home and need help finding a safe place to live. ? You are using tobacco products, alcohol, or drugs and you need help to stop. Get help right away if: You have signs or symptoms of labor before 37 weeks of  pregnancy. These include:  Contractions that are 5 minutes or less apart, or that increase in frequency, intensity, or length.  Sudden, sharp abdominal pain or low back pain.  Uncontrolled gush or trickle of fluid from your vagina. Summary  A pregnancy lasts about 40 weeks, starting from the first day of your last period until the baby is born. Pregnancy is divided into three phases called trimesters. Each trimester has warning signs to watch for.  Always report any warning signs to your health care provider in order to prevent complications that may affect both you and your baby.  Talk with your health care provider about your current health and any medical conditions you have. Make sure you know the symptoms that you should watch for and report. This information is not intended to replace advice given to you by your health care provider. Make sure you discuss any questions you have with your health care provider. Document Released: 02/13/2017 Document Revised: 08/18/2018 Document Reviewed: 02/13/2017 Elsevier Patient Education  2020 [employer].

## 2019-02-18 ENCOUNTER — Ambulatory Visit (HOSPITAL_COMMUNITY)
Admission: RE | Admit: 2019-02-18 | Discharge: 2019-02-18 | Disposition: A | Discharge status: Home / Self Care | Payer: Medicaid Other | Source: Ambulatory Visit | Attending: Obstetrics and Gynecology | Admitting: Obstetrics and Gynecology

## 2019-02-18 ENCOUNTER — Other Ambulatory Visit (HOSPITAL_COMMUNITY): Payer: Self-pay | Admitting: *Deleted

## 2019-02-18 ENCOUNTER — Other Ambulatory Visit: Payer: Self-pay

## 2019-02-18 ENCOUNTER — Ambulatory Visit (HOSPITAL_COMMUNITY): Payer: Medicaid Other | Admitting: *Deleted

## 2019-02-18 ENCOUNTER — Encounter (HOSPITAL_COMMUNITY): Payer: Self-pay

## 2019-02-18 DIAGNOSIS — O99210 Obesity complicating pregnancy, unspecified trimester: Secondary | ICD-10-CM | POA: Diagnosis not present

## 2019-02-18 DIAGNOSIS — Z362 Encounter for other antenatal screening follow-up: Secondary | ICD-10-CM | POA: Diagnosis not present

## 2019-02-18 DIAGNOSIS — Z3A22 22 weeks gestation of pregnancy: Secondary | ICD-10-CM | POA: Diagnosis not present

## 2019-02-18 DIAGNOSIS — O99212 Obesity complicating pregnancy, second trimester: Secondary | ICD-10-CM | POA: Diagnosis not present

## 2019-02-18 DIAGNOSIS — O2622 Pregnancy care for patient with recurrent pregnancy loss, second trimester: Secondary | ICD-10-CM | POA: Diagnosis not present

## 2019-02-22 ENCOUNTER — Encounter: Payer: Self-pay | Admitting: Advanced Practice Midwife

## 2019-02-22 ENCOUNTER — Telehealth (INDEPENDENT_AMBULATORY_CARE_PROVIDER_SITE_OTHER): Payer: Medicaid Other | Admitting: Advanced Practice Midwife

## 2019-02-22 VITALS — BP 126/83 | HR 94 | Ht 65.0 in

## 2019-02-22 DIAGNOSIS — F32A Depression, unspecified: Secondary | ICD-10-CM

## 2019-02-22 DIAGNOSIS — O26892 Other specified pregnancy related conditions, second trimester: Secondary | ICD-10-CM

## 2019-02-22 DIAGNOSIS — F329 Major depressive disorder, single episode, unspecified: Secondary | ICD-10-CM

## 2019-02-22 DIAGNOSIS — O99212 Obesity complicating pregnancy, second trimester: Secondary | ICD-10-CM

## 2019-02-22 DIAGNOSIS — O99210 Obesity complicating pregnancy, unspecified trimester: Secondary | ICD-10-CM

## 2019-02-22 DIAGNOSIS — O99342 Other mental disorders complicating pregnancy, second trimester: Secondary | ICD-10-CM

## 2019-02-22 DIAGNOSIS — R519 Headache, unspecified: Secondary | ICD-10-CM

## 2019-02-22 DIAGNOSIS — Z3490 Encounter for supervision of normal pregnancy, unspecified, unspecified trimester: Secondary | ICD-10-CM

## 2019-02-22 DIAGNOSIS — Z3A23 23 weeks gestation of pregnancy: Secondary | ICD-10-CM

## 2019-02-22 MED ORDER — BUTALBITAL-APAP-CAFFEINE 50-325-40 MG PO TABS: 1.0000 | ORAL_TABLET | Freq: Four times a day (QID) | ORAL | 0 refills | Status: DC | PRN

## 2019-02-22 NOTE — Progress Notes (Signed)
TELEHEALTH OBSTETRICS PRENATAL VIRTUAL VIDEO VISIT ENCOUNTER NOTE  Provider location: [facility] [place]   I connected with Alexis Ellis on 02/22/19 at  1:55 PM EDT by MyChart Video Encounter at home and verified that I am speaking with the correct person using two identifiers.   I discussed the limitations, risks, security and privacy concerns of performing an evaluation and management service virtually and the availability of in person appointments. I also discussed with the patient that there may be a patient responsible charge related to this service. The patient expressed understanding and agreed to proceed. Subjective:  Alexis Ellis is a 28 y.o. [name] at [redacted]w[redacted]d being seen today for ongoing prenatal care.  She is currently monitored for the following issues for this low-risk pregnancy and has ADD (attention deficit disorder); Anxiety and depression; Tension headache; HLD (hyperlipidemia); Obesity (BMI 35.0-39.9 without comorbidity); Supervision of normal pregnancy, antepartum; History of ectopic pregnancy; History of multiple miscarriages; Abnormal Pap smear of cervix; and Maternal obesity affecting pregnancy, antepartum on their problem list.  Patient reports headache.  Contractions: Not present. Vag. Bleeding: None.  Movement: Present. Denies any leaking of fluid.   The following portions of the patient's history were reviewed and updated as appropriate: allergies, current medications, past family history, past medical history, past social history, past surgical history and problem list.   Objective:   Vitals:   02/22/19 1336  BP: 126/83  Pulse: 94    Fetal Status:     Movement: Present     General:  Alert, oriented and cooperative. Patient is in no acute distress.  Respiratory: Normal respiratory effort, no problems with respiration noted  Mental Status: Normal mood and affect. Normal behavior. Normal judgment and thought content.  Rest of physical  exam deferred due to type of encounter  Imaging: [place] Mfm Ob Follow Up  Result Date: 02/18/2019 ----------------------------------------------------------------------  OBSTETRICS REPORT                       (Signed Final 02/18/2019 10:56 am) ---------------------------------------------------------------------- Patient Info  ID #:       [id number]                          D.O.B.:  [birth year] (28 yrs)  Name:       Alexis Ellis                Visit Date: 02/18/2019 08:47 am ---------------------------------------------------------------------- Performed By  Performed By:     Hubert Azure          Ref. Address:     Faculty                    RDMS  Attending:        Tama High MD        [facility]  Referred By:      Vickii Chafe                    CONSTANT MD ----------------------------------------------------------------------  Orders   #  Description                          Code         Ordered By   1  [place] MFM OB FOLLOW UP                  [id number]     YU FANG  ----------------------------------------------------------------------   #  Order #                    Accession #                 Episode #   1  [id number]                  [id number]                  [id number]  ---------------------------------------------------------------------- Indications   Encounter for other antenatal screening        Z36.2   follow-up   Obesity complicating pregnancy (pregravid      O99.210 E66.9   BMI 40)   [redacted] weeks gestation of pregnancy                Z3A.22   Poor obstetric history-Recurrent (habitual)    O26.20   abortion (4 consecutive ab's)  ---------------------------------------------------------------------- Vital Signs                                                 Height:        5'5" ---------------------------------------------------------------------- Fetal Evaluation  Num Of Fetuses:         1  Fetal Heart Rate(bpm):  150   Cardiac Activity:       Observed  Presentation:           Cephalic  Placenta:               Anterior  P. Cord Insertion:      Visualized, central  Amniotic Fluid  AFI FV:      Within normal limits                              Largest Pocket(cm)                              5.4 ---------------------------------------------------------------------- Biometry  BPD:      59.4  mm     G. Age:  24w 2d         92  %    CI:        77.05   %    70 - 86                                                          FL/HC:      18.7   %    19.2 - 20.8  HC:      214.3  mm     G. Age:  23w 3d  68  %    HC/AC:      1.15        1.05 - 1.21  AC:      186.5  mm     G. Age:  23w 3d         12  %    FL/BPD:     67.5   %    71 - 87  FL:       40.1  mm     G. Age:  23w 0d         3  %    FL/AC:      21.5   %    20 - 24  HUM:      37.3  mm     G. Age:  23w 0d         36  %  LV:        6.3  mm  Est. FW:     581  gm      1 lb 4 oz     72  % ---------------------------------------------------------------------- OB History  Gravidity:    5         Term:   0        Prem:   0        SAB:   3  Ectopic:      1        Living:  0 ---------------------------------------------------------------------- Gestational Age  LMP:           22w 5d        Date:  09/12/18                 EDD:   06/19/19  U/S Today:     23w 4d                                        EDD:   06/13/19  Best:          22w 5d     Det. By:  LMP  (09/12/18)          EDD:   06/19/19 ---------------------------------------------------------------------- Anatomy  Cranium:               Appears normal         LVOT:                   Previously seen  Cavum:                 Appears normal         Aortic Arch:            Appears normal  Ventricles:            Appears normal         Ductal Arch:            limited views  Choroid Plexus:        Previously seen        Diaphragm:              Appears normal  Cerebellum:            Previously seen        Stomach:                Appears normal,  left  sided  Posterior Fossa:       Previously seen        Abdomen:                Appears normal  Nuchal Fold:           Previously seen        Abdominal Wall:         Appears nml (cord                                                                        insert, abd wall)  Face:                  Orbits and profile     Cord Vessels:           Previously seen                         previously seen  Lips:                  Previously seen        Kidneys:                Appear normal  Palate:                Previously seen        Bladder:                Appears normal  Thoracic:              Appears normal         Spine:                  Appears normal  Heart:                 Appears normal         Upper Extremities:      Previously seen                         (4CH, axis, and                         situs)  RVOT:                  Appears normal         Lower Extremities:      Previously seen  Other:  Female gender. Nasal bone visualized. 5th digit visualized. Technically          difficult due to maternal habitus and fetal position. ---------------------------------------------------------------------- Impression  Patient returned for completion of fetal anatomy.  Amniotic fluid is normal and good fetal activity is seen. Fetal  growth is appropriate for gestational age. Fetal anatomical  survey was completed and appears normal.  Maternal obesity imposes limitations on the resolution of  images, and failure to detect fetal anomalies is more common  in obese pregnant women. As maternal obesity makes  clinical assessment of fetal growth difficult, we recommend  serial growth scans until delivery. ---------------------------------------------------------------------- Recommendations  -An appointment was made for her to return in 4 weeks for  fetal growth assessment. ----------------------------------------------------------------------                   Tama High, MD Electronically Signed Final Report   02/18/2019 10:56 am ----------------------------------------------------------------------   Assessment and Plan:  Pregnancy: [id number] at [redacted]w[redacted]d  1. Maternal obesity affecting pregnancy, antepartum   2. Encounter for supervision of normal pregnancy, antepartum, unspecified gravidity --Anticipatory guidance about next visits/weeks of pregnancy given.   3. Pregnancy headache in second trimester --Started with start of Prozac by behavioral health.  H/a are one sided, associated with nausea and vomiting, improved but not resolved by Tylenol. - butalbital-acetaminophen-caffeine (FIORICET) 50-325-40 MG tablet; Take 1-2 tablets by mouth every 6 (six) hours as needed for headache.  Dispense: 20 tablet; Refill: 0  4. Depression affecting pregnancy in second trimester, antepartum --Pt resumed Prozac due to worsening depression.    Preterm labor symptoms and general obstetric precautions including but not limited to vaginal bleeding, contractions, leaking of fluid and fetal movement were reviewed in detail with the patient. I discussed the assessment and treatment plan with the patient. The patient was provided an opportunity to ask questions and all were answered. The patient agreed with the plan and demonstrated an understanding of the instructions. The patient was advised to call back or seek an in-person office evaluation/go to [facility] at [facility] for any urgent or concerning symptoms. Please refer to After Visit Summary for other counseling recommendations.   I provided 10 minutes of face-to-face time during this encounter.  Return in about 4 weeks (around 03/22/2019).  Future Appointments  Date Time Provider [facility]  03/18/2019  8:45 AM [facility]  03/18/2019  8:45 AM [facility] [place] 2 [facility]  03/22/2019  8:45 AM [facility] None  03/22/2019 10:55 AM [place], Kathie Dike, CNM [facility] None     Fatima Blank, [place] for [employer], [place]

## 2019-02-22 NOTE — Progress Notes (Signed)
I connected with  Alexis Ellis on 02/22/19 by a video enabled telemedicine application and verified that I am speaking with the correct person using two identifiers.   MyChart ROB.  Reports no problems today.

## 2019-02-24 ENCOUNTER — Telehealth: Payer: Self-pay

## 2019-02-24 NOTE — Telephone Encounter (Signed)
Patient called stating that she is having a lot of pressure in her head from her migraines. She states that she is not feeling any better since starting the Fioricet on Monday. She is still vomiting, no energy, and just not feeling well. Patient would like to know what else can be done to help her symptoms.

## 2019-02-25 ENCOUNTER — Other Ambulatory Visit: Payer: Self-pay | Admitting: Advanced Practice Midwife

## 2019-02-25 DIAGNOSIS — G43009 Migraine without aura, not intractable, without status migrainosus: Secondary | ICD-10-CM

## 2019-02-25 NOTE — Progress Notes (Signed)
Pt called office reporting worsening migraine h/a. Since pt is in second trimester, pt instructed to take ibuprofen 600 mg and drink plenty of water at onset of migraine, then Fioricet as prescribed if that does not work.  Referral to headache specialist, Allie Dimmer, PA, and to neurology.

## 2019-03-11 ENCOUNTER — Other Ambulatory Visit: Payer: Self-pay

## 2019-03-11 DIAGNOSIS — O99210 Obesity complicating pregnancy, unspecified trimester: Secondary | ICD-10-CM

## 2019-03-11 DIAGNOSIS — Z3490 Encounter for supervision of normal pregnancy, unspecified, unspecified trimester: Secondary | ICD-10-CM

## 2019-03-11 MED ORDER — MISC. DEVICES MISC: 0 refills | Status: DC

## 2019-03-17 ENCOUNTER — Telehealth: Payer: Self-pay

## 2019-03-17 DIAGNOSIS — O26899 Other specified pregnancy related conditions, unspecified trimester: Secondary | ICD-10-CM

## 2019-03-17 DIAGNOSIS — G5600 Carpal tunnel syndrome, unspecified upper limb: Secondary | ICD-10-CM

## 2019-03-17 NOTE — Telephone Encounter (Signed)
  Referral placed for Othro. Per Dr. Jodi Mourning

## 2019-03-18 ENCOUNTER — Encounter (HOSPITAL_COMMUNITY): Payer: Self-pay

## 2019-03-18 ENCOUNTER — Telehealth: Payer: Self-pay | Admitting: Radiology

## 2019-03-18 ENCOUNTER — Ambulatory Visit (HOSPITAL_COMMUNITY): Payer: Medicaid Other | Admitting: *Deleted

## 2019-03-18 ENCOUNTER — Other Ambulatory Visit (HOSPITAL_COMMUNITY): Payer: Self-pay | Admitting: *Deleted

## 2019-03-18 ENCOUNTER — Ambulatory Visit (HOSPITAL_COMMUNITY)
Admission: RE | Admit: 2019-03-18 | Discharge: 2019-03-18 | Disposition: A | Discharge status: Home / Self Care | Payer: Medicaid Other | Source: Ambulatory Visit | Attending: Obstetrics and Gynecology | Admitting: Obstetrics and Gynecology

## 2019-03-18 ENCOUNTER — Other Ambulatory Visit: Payer: Self-pay

## 2019-03-18 ENCOUNTER — Telehealth: Payer: Self-pay

## 2019-03-18 DIAGNOSIS — O99212 Obesity complicating pregnancy, second trimester: Secondary | ICD-10-CM

## 2019-03-18 DIAGNOSIS — Z362 Encounter for other antenatal screening follow-up: Secondary | ICD-10-CM

## 2019-03-18 DIAGNOSIS — O99210 Obesity complicating pregnancy, unspecified trimester: Secondary | ICD-10-CM | POA: Diagnosis not present

## 2019-03-18 DIAGNOSIS — Z3A26 26 weeks gestation of pregnancy: Secondary | ICD-10-CM

## 2019-03-18 DIAGNOSIS — O2622 Pregnancy care for patient with recurrent pregnancy loss, second trimester: Secondary | ICD-10-CM

## 2019-03-18 DIAGNOSIS — O09299 Supervision of pregnancy with other poor reproductive or obstetric history, unspecified trimester: Secondary | ICD-10-CM

## 2019-03-18 NOTE — Telephone Encounter (Signed)
  Returned call to patient, advised her that the referral was sent to [facility] yesterday, if they need records she can sign a Release of Information for her records.

## 2019-03-18 NOTE — Telephone Encounter (Signed)
Left message for patient to call [facility] to schedule new headache appointment

## 2019-03-19 DIAGNOSIS — G5603 Carpal tunnel syndrome, bilateral upper limbs: Secondary | ICD-10-CM | POA: Diagnosis not present

## 2019-03-22 ENCOUNTER — Other Ambulatory Visit: Payer: Self-pay

## 2019-03-22 ENCOUNTER — Other Ambulatory Visit: Payer: Medicaid Other

## 2019-03-22 ENCOUNTER — Ambulatory Visit (INDEPENDENT_AMBULATORY_CARE_PROVIDER_SITE_OTHER): Payer: Medicaid Other | Admitting: Advanced Practice Midwife

## 2019-03-22 VITALS — BP 129/79 | HR 90 | Wt 252.9 lb

## 2019-03-22 DIAGNOSIS — O26892 Other specified pregnancy related conditions, second trimester: Secondary | ICD-10-CM

## 2019-03-22 DIAGNOSIS — F329 Major depressive disorder, single episode, unspecified: Secondary | ICD-10-CM

## 2019-03-22 DIAGNOSIS — Z23 Encounter for immunization: Secondary | ICD-10-CM | POA: Diagnosis not present

## 2019-03-22 DIAGNOSIS — F32A Depression, unspecified: Secondary | ICD-10-CM

## 2019-03-22 DIAGNOSIS — O24410 Gestational diabetes mellitus in pregnancy, diet controlled: Secondary | ICD-10-CM

## 2019-03-22 DIAGNOSIS — O99342 Other mental disorders complicating pregnancy, second trimester: Secondary | ICD-10-CM

## 2019-03-22 DIAGNOSIS — Z3A27 27 weeks gestation of pregnancy: Secondary | ICD-10-CM

## 2019-03-22 DIAGNOSIS — G5600 Carpal tunnel syndrome, unspecified upper limb: Secondary | ICD-10-CM

## 2019-03-22 DIAGNOSIS — Z3490 Encounter for supervision of normal pregnancy, unspecified, unspecified trimester: Secondary | ICD-10-CM | POA: Diagnosis not present

## 2019-03-22 DIAGNOSIS — O26899 Other specified pregnancy related conditions, unspecified trimester: Secondary | ICD-10-CM

## 2019-03-22 MED ORDER — PREDNISONE 20 MG PO TABS: 20.0000 mg | ORAL_TABLET | Freq: Two times a day (BID) | ORAL | 0 refills | Status: AC

## 2019-03-22 NOTE — Progress Notes (Signed)
ROB/GTT.  Declined Flu vaccine.  TDAP given in LD, tolerated well.  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

## 2019-03-22 NOTE — Progress Notes (Signed)
PRENATAL VISIT NOTE  Subjective:  Alexis Ellis is a 28 y.o. [id number] at [redacted]w[redacted]d being seen today for ongoing prenatal care.  She is currently monitored for the following issues for this low-risk pregnancy and has ADD (attention deficit disorder); Depression affecting pregnancy in second trimester, antepartum; Tension headache; HLD (hyperlipidemia); Obesity (BMI 35.0-39.9 without comorbidity); Supervision of normal pregnancy, antepartum; History of ectopic pregnancy; History of multiple miscarriages; Abnormal Pap smear of cervix; and Maternal obesity affecting pregnancy, antepartum on their problem list.  Patient reports carpal tunnel symptoms.  Contractions: Not present. Vag. Bleeding: None.  Movement: Present. Denies leaking of fluid.   The following portions of the patient's history were reviewed and updated as appropriate: allergies, current medications, past family history, past medical history, past social history, past surgical history and problem list.   Objective:   Vitals:   03/22/19 0915  BP: 129/79  Pulse: 90  Weight: 252 lb 14.4 oz (114.7 kg)    Fetal Status:     Movement: Present     General:  Alert, oriented and cooperative. Patient is in no acute distress.  Skin: Skin is warm and dry. No rash noted.   Cardiovascular: Normal heart rate noted  Respiratory: Normal respiratory effort, no problems with respiration noted  Abdomen: Soft, gravid, appropriate for gestational age.  Pain/Pressure: Present     Pelvic: Cervical exam deferred        Extremities: Normal range of motion.  Edema: Trace  Mental Status: Normal mood and affect. Normal behavior. Normal judgment and thought content.   Assessment and Plan:  Pregnancy: [id number] at [redacted]w[redacted]d 1. Encounter for supervision of normal pregnancy, antepartum, unspecified gravidity --Anticipatory guidance about next visits/weeks of pregnancy given.  - Glucose Tolerance, 2 Hours w/1 Hour - CBC - RPR - HIV antibody (with reflex)   2. Carpal tunnel syndrome during pregnancy --Carpal tunnel is severe, pt saw Ortho and is wearing a brace which is not helping. Ortho reported that they would give prednisone but can't because of the pregnancy so have her scheduled for cortisone injections.  Talked with pt today about both options and pt would like to try oral prednisone so she can avoid painful injections if possible.  Discussed risks of prednisone in pregnancy, low risk with short course and is often used with asthma exacerbation or systemic allergic reactions.  Pt desires prednisone so Rx written.  Follow up with orthopedics. - predniSONE (DELTASONE) 20 MG tablet; Take 1 tablet (20 mg total) by mouth 2 (two) times daily with a meal for 5 days.  Dispense: 10 tablet; Refill: 0   3. Depression affecting pregnancy in second trimester, antepartum --Pt stopped Prozac because it was causing headaches. She reports she is doing well without it, feels mildly depressed but this is her baseline, denies any thoughts of harming herself or others.  Desires to keep managing without meds.  Has a psychiatrist but no regular counseling.  --Refer to integrated [place] to follow up, consider options postpartum  Preterm labor symptoms and general obstetric precautions including but not limited to vaginal bleeding, contractions, leaking of fluid and fetal movement were reviewed in detail with the patient. Please refer to After Visit Summary for other counseling recommendations.   Return in about 4 weeks (around 04/19/2019).  Future Appointments  Date Time Provider [place]  04/19/2019  2:00 PM Shelly Bombard, MD [facility] None  04/22/2019  8:45 AM [facility] NURSE [place] [facility]  04/22/2019  8:45 AM [place] 2 [facility]  Fatima Blank, CNM

## 2019-03-23 ENCOUNTER — Encounter: Payer: Self-pay | Admitting: Advanced Practice Midwife

## 2019-03-23 DIAGNOSIS — O24410 Gestational diabetes mellitus in pregnancy, diet controlled: Secondary | ICD-10-CM

## 2019-03-23 HISTORY — DX: Gestational diabetes mellitus in pregnancy, diet controlled: O24.410

## 2019-03-23 LAB — CBC
Hematocrit: 37.7 % (ref 34.0–46.6)
Hemoglobin: 12.7 g/dL (ref 11.1–15.9)
MCH: 28.2 pg (ref 26.6–33.0)
MCHC: 33.7 g/dL (ref 31.5–35.7)
MCV: 84 fL (ref 79–97)
Platelets: 280 10*3/uL (ref 150–450)
RBC: 4.51 x10E6/uL (ref 3.77–5.28)
RDW: 12.8 % (ref 11.7–15.4)
WBC: 8.6 10*3/uL (ref 3.4–10.8)

## 2019-03-23 LAB — RPR: RPR Ser Ql: NONREACTIVE

## 2019-03-23 LAB — GLUCOSE TOLERANCE, 2 HOURS W/ 1HR
Glucose, 1 hour: 226 mg/dL — ABNORMAL HIGH (ref 65–179)
Glucose, 2 hour: 139 mg/dL (ref 65–152)
Glucose, Fasting: 94 mg/dL — ABNORMAL HIGH (ref 65–91)

## 2019-03-23 LAB — HIV ANTIBODY (ROUTINE TESTING W REFLEX): HIV Screen 4th Generation wRfx: NONREACTIVE

## 2019-03-23 NOTE — Addendum Note (Signed)
Addended by: Fatima Blank A on: 03/23/2019 03:59 PM   Modules accepted: Orders

## 2019-03-30 ENCOUNTER — Other Ambulatory Visit: Payer: Self-pay

## 2019-03-30 ENCOUNTER — Ambulatory Visit: Payer: Medicaid Other | Admitting: Clinical

## 2019-03-30 ENCOUNTER — Encounter: Payer: Self-pay | Admitting: Obstetrics & Gynecology

## 2019-03-30 DIAGNOSIS — Z5329 Procedure and treatment not carried out because of patient's decision for other reasons: Secondary | ICD-10-CM

## 2019-03-30 DIAGNOSIS — Z91199 Patient's noncompliance with other medical treatment and regimen due to unspecified reason: Secondary | ICD-10-CM

## 2019-03-30 NOTE — BH Specialist Note (Signed)
Pt did not arrive to video visit and did not answer the phone ;Left HIPPA-compliant message to call back Roselyn Reef from [facility] [employer] at [phone].   left MyChart message for patient.    [place] via Telemedicine Video Visit  03/30/2019 Alexis Ellis [id number]  Garlan Fair

## 2019-03-31 ENCOUNTER — Telehealth: Payer: Self-pay | Admitting: Family Medicine

## 2019-03-31 NOTE — Telephone Encounter (Signed)
Attempted to call patient about her appointment on 11/19 @ 10:15. No answer left voicemail instructing patient to wear a face mask for the entire appointment and no visitors are allowed during the visit. Patient instructed not to attend the appointment if she was any symptoms. Symptom list and office number left.

## 2019-04-01 ENCOUNTER — Other Ambulatory Visit: Payer: Medicaid Other

## 2019-04-05 ENCOUNTER — Encounter: Payer: Medicaid Other | Attending: Advanced Practice Midwife | Admitting: Registered"

## 2019-04-05 ENCOUNTER — Other Ambulatory Visit: Payer: Self-pay

## 2019-04-05 DIAGNOSIS — O24410 Gestational diabetes mellitus in pregnancy, diet controlled: Secondary | ICD-10-CM | POA: Diagnosis not present

## 2019-04-06 ENCOUNTER — Encounter: Payer: Self-pay | Admitting: Registered"

## 2019-04-06 NOTE — Progress Notes (Signed)
Patient was seen on 04/05/19 for Gestational Diabetes self-management. EDD 06/18/2018. Patient states no history of GDM. Diet history obtained. The following learning objectives were met by the patient :   States the definition of Gestational Diabetes  States why dietary management is important in controlling blood glucose  Describes the effects of carbohydrates on blood glucose levels  Demonstrates ability to create a balanced meal plan  Demonstrates carbohydrate counting   States when to check blood glucose levels  Demonstrates proper blood glucose monitoring techniques  States the effect of stress and exercise on blood glucose levels  States the importance of limiting caffeine and abstaining from alcohol and smoking  Plan:  Aim for 3 Carb Choices per meal (45 grams) +/- 1 either way  Aim for 1-2 Carbs per snack Begin reading food labels for Total Carbohydrate of foods If OK with your MD, consider  increasing your activity level by walking, Arm Chair Exercises or other activity daily as tolerated Begin checking BG before breakfast and 2 hours after first bite of breakfast, lunch and dinner as directed by MD  Bring Log Book/Sheet and meter to every medical appointment   Baby Scripts:  Patient was not introduced to [employer]; patient will record BG on Log Sheet  Take medication if directed by MD  Blood glucose monitor given: Accu Check Guide Me Lot # [id number] Exp: 07/28/2019 Blood glucose reading: 92 mg/dl  Patient instructed to test pre breakfast and 2 hours each meal as directed by MD  Patient instructed to monitor glucose levels: FBS: 60 - 95 mg/dl 2 hour: <120 mg/dl  Patient received the following handouts:  Nutrition Diabetes and Pregnancy  Carbohydrate Counting List  BG Log Sheet  Patient will be seen for follow-up in as needed.

## 2019-04-07 ENCOUNTER — Other Ambulatory Visit: Payer: Self-pay | Admitting: *Deleted

## 2019-04-07 DIAGNOSIS — O0990 Supervision of high risk pregnancy, unspecified, unspecified trimester: Secondary | ICD-10-CM

## 2019-04-07 DIAGNOSIS — O24410 Gestational diabetes mellitus in pregnancy, diet controlled: Secondary | ICD-10-CM

## 2019-04-07 MED ORDER — ACCU-CHEK FASTCLIX LANCETS MISC: 1.0000 | Freq: Four times a day (QID) | 5 refills | Status: DC

## 2019-04-07 MED ORDER — ACCU-CHEK GUIDE VI STRP: ORAL_STRIP | 5 refills | Status: DC

## 2019-04-07 NOTE — Progress Notes (Signed)
Orders sent in for diabetic testing supplies.  Pt made aware via mychart.

## 2019-04-14 LAB — OB RESULTS CONSOLE GBS: GBS: NEGATIVE

## 2019-04-16 ENCOUNTER — Inpatient Hospital Stay (HOSPITAL_COMMUNITY)
Admission: AD | Admit: 2019-04-16 | Discharge: 2019-04-16 | Disposition: A | Discharge status: Home / Self Care | Payer: Medicaid Other | Attending: Obstetrics & Gynecology | Admitting: Obstetrics & Gynecology

## 2019-04-16 ENCOUNTER — Other Ambulatory Visit: Payer: Self-pay

## 2019-04-16 ENCOUNTER — Encounter (HOSPITAL_COMMUNITY): Payer: Self-pay | Admitting: *Deleted

## 2019-04-16 DIAGNOSIS — O99343 Other mental disorders complicating pregnancy, third trimester: Secondary | ICD-10-CM | POA: Insufficient documentation

## 2019-04-16 DIAGNOSIS — Z87891 Personal history of nicotine dependence: Secondary | ICD-10-CM | POA: Insufficient documentation

## 2019-04-16 DIAGNOSIS — Z79899 Other long term (current) drug therapy: Secondary | ICD-10-CM | POA: Diagnosis not present

## 2019-04-16 DIAGNOSIS — R030 Elevated blood-pressure reading, without diagnosis of hypertension: Secondary | ICD-10-CM | POA: Diagnosis not present

## 2019-04-16 DIAGNOSIS — E161 Other hypoglycemia: Secondary | ICD-10-CM | POA: Diagnosis not present

## 2019-04-16 DIAGNOSIS — F329 Major depressive disorder, single episode, unspecified: Secondary | ICD-10-CM | POA: Diagnosis not present

## 2019-04-16 DIAGNOSIS — O163 Unspecified maternal hypertension, third trimester: Secondary | ICD-10-CM

## 2019-04-16 DIAGNOSIS — E162 Hypoglycemia, unspecified: Secondary | ICD-10-CM | POA: Diagnosis not present

## 2019-04-16 DIAGNOSIS — Z3A30 30 weeks gestation of pregnancy: Secondary | ICD-10-CM | POA: Insufficient documentation

## 2019-04-16 DIAGNOSIS — R000 Tachycardia, unspecified: Secondary | ICD-10-CM | POA: Diagnosis not present

## 2019-04-16 DIAGNOSIS — O26893 Other specified pregnancy related conditions, third trimester: Secondary | ICD-10-CM | POA: Insufficient documentation

## 2019-04-16 DIAGNOSIS — Z7982 Long term (current) use of aspirin: Secondary | ICD-10-CM | POA: Insufficient documentation

## 2019-04-16 DIAGNOSIS — I10 Essential (primary) hypertension: Secondary | ICD-10-CM | POA: Diagnosis not present

## 2019-04-16 DIAGNOSIS — O24410 Gestational diabetes mellitus in pregnancy, diet controlled: Secondary | ICD-10-CM | POA: Diagnosis not present

## 2019-04-16 DIAGNOSIS — Z8249 Family history of ischemic heart disease and other diseases of the circulatory system: Secondary | ICD-10-CM | POA: Insufficient documentation

## 2019-04-16 LAB — CBC
HCT: 39.2 % (ref 36.0–46.0)
Hemoglobin: 13.5 g/dL (ref 12.0–15.0)
MCH: 28.5 pg (ref 26.0–34.0)
MCHC: 34.4 g/dL (ref 30.0–36.0)
MCV: 82.7 fL (ref 80.0–100.0)
Platelets: 278 10*3/uL (ref 150–400)
RBC: 4.74 MIL/uL (ref 3.87–5.11)
RDW: 13.1 % (ref 11.5–15.5)
WBC: 9.9 10*3/uL (ref 4.0–10.5)
nRBC: 0 % (ref 0.0–0.2)

## 2019-04-16 LAB — COMPREHENSIVE METABOLIC PANEL
ALT: 18 U/L (ref 0–44)
AST: 17 U/L (ref 15–41)
Albumin: 2.6 g/dL — ABNORMAL LOW (ref 3.5–5.0)
Alkaline Phosphatase: 67 U/L (ref 38–126)
Anion gap: 8 (ref 5–15)
BUN: 8 mg/dL (ref 6–20)
CO2: 24 mmol/L (ref 22–32)
Calcium: 9.6 mg/dL (ref 8.9–10.3)
Chloride: 107 mmol/L (ref 98–111)
Creatinine, Ser: 0.66 mg/dL (ref 0.44–1.00)
GFR calc Af Amer: >60 mL/min (ref >60)
GFR calc non Af Amer: >60 mL/min (ref >60)
Glucose, Bld: 83 mg/dL (ref 70–99)
Potassium: 4.7 mmol/L (ref 3.5–5.1)
Sodium: 139 mmol/L (ref 135–145)
Total Bilirubin: 0.5 mg/dL (ref 0.3–1.2)
Total Protein: 5.7 g/dL — ABNORMAL LOW (ref 6.5–8.1)

## 2019-04-16 LAB — PROTEIN / CREATININE RATIO, URINE
Creatinine, Urine: 89.73 mg/dL
Protein Creatinine Ratio: 0.13 mg/mg{Cre} (ref 0.00–0.15)
Total Protein, Urine: 12 mg/dL

## 2019-04-16 LAB — URINALYSIS, ROUTINE W REFLEX MICROSCOPIC
Bilirubin Urine: NEGATIVE
Glucose, UA: NEGATIVE mg/dL
Hgb urine dipstick: NEGATIVE
Ketones, ur: NEGATIVE mg/dL
Nitrite: NEGATIVE
Protein, ur: NEGATIVE mg/dL
Specific Gravity, Urine: 1.015 (ref 1.005–1.030)
pH: 6 (ref 5.0–8.0)

## 2019-04-16 NOTE — MAU Note (Signed)
Pt arrived EMS. Stated her B/P at home was high 200/110. 150/110on EMS. Pt denies any headache or visual disturbances. Good fetal movement reported.

## 2019-04-16 NOTE — Discharge Instructions (Signed)
Please take your blood pressure daily and call for anything >140/90. Please alert the provider you see on Monday that you were here for elevated blood pressure.

## 2019-04-16 NOTE — MAU Provider Note (Signed)
History     CSN: [id number]  Arrival date and time: 04/16/19 1451   First Provider Initiated Contact with Patient 04/16/19 1537      Chief Complaint  Patient presents with  . Hypertension   HPI  Ms.  Alexis Ellis is a 28 y.o. year old [id number] female at 45w6dweeks gestation who presents to MAU via EMS reporting high blood pressure at home. She reports her blood pressures were elevated. She then went to the fire department and the blood pressures were 205/117 and then 228/118. EMS was dispatched to the fire department and her blood pressure was reportedly 240/120 "something". She denies any headache, blurry vision, or epigastric pain. She complains of increased swelling in her BLE for the past 2 weeks or more. She has not had any problems with her blood pressures before today. She reports her normal blood pressures are 125/86; with the highest being 131/90.   Past Medical History:  Diagnosis Date  . ADD (attention deficit disorder)   . Chicken pox   . Depression   . Diet controlled gestational diabetes mellitus (GDM) in third trimester 03/23/2019  . Ectopic pregnancy   . Elevated blood pressure   . Elevated cholesterol   . Frequent headaches   . History of kidney stones     Past Surgical History:  Procedure Laterality Date  . LAPAROSCOPY N/A 09/16/2012   Procedure: LAPAROSCOPY OPERATIVE;  Surgeon: JAllena Katz MD;  [facility];  Service: Gynecology;  Laterality: N/A;  Evacuation Hemoperitoneum  . UNILATERAL SALPINGECTOMY Right 09/16/2012   Procedure: UNILATERAL SALPINGECTOMY;  Surgeon: JAllena Katz MD;  [facility];  Service: Gynecology;  Laterality: Right;  . WISDOM TOOTH EXTRACTION      Family History  Problem Relation Age of Onset  . Hypertension Mother        Living  . Fibromyalgia Mother   . Diabetes Father 469      Deceased  . Heart attack Father   . Sudden death Father   . Bipolar disorder Maternal Grandmother   . Diabetes Maternal  Grandfather   . Alcohol abuse Paternal Grandfather   . Liver disease Paternal Grandfather   . Hyperlipidemia Paternal Grandmother   . Kidney disease Paternal Grandmother   . Liver disease Paternal Grandmother   . Heart disease Paternal Grandmother   . Healthy Sister     Social History   Tobacco Use  . Smoking status: Former Smoker    Packs/day: 0.00  . Smokeless tobacco: Never Used  Substance Use Topics  . Alcohol use: Not Currently    Alcohol/week: 0.0 standard drinks    Comment: social  . Drug use: No    Allergies: No Known Allergies  Medications Prior to Admission  Medication Sig Dispense Refill Last Dose  . acetaminophen (TYLENOL) 325 MG tablet Take 650 mg by mouth every 6 (six) hours as needed.   Past Week at Unknown time  . amphetamine-dextroamphetamine (ADDERALL) 20 MG tablet Take 20 mg by mouth daily.     .Marland Kitchenaspirin EC 81 MG tablet Take 1 tablet (81 mg total) by mouth daily. Take after 12 weeks for prevention of preeclampsia later in pregnancy 300 tablet 2 04/16/2019 at Unknown time  . butalbital-acetaminophen-caffeine (FIORICET) 50-325-40 MG tablet Take 1-2 tablets by mouth every 6 (six) hours as needed for headache. 20 tablet 0 Past Month at Unknown time  . Prenatal MV-Min-FA-Omega-3 (PRENATAL GUMMIES/DHA & FA) 0.4-32.5 MG CHEW Chew by mouth.  04/16/2019 at Unknown time  . Accu-Chek FastClix Lancets MISC 1 each by Percutaneous route 4 (four) times daily. 100 each 5   . Blood Pressure Monitor KIT 1 Device by Does not apply route once a week. To be monitored Regularly at home. 1 kit 0   . FLUoxetine (PROZAC) 40 MG capsule Take 40 mg by mouth daily.     Marland Kitchen glucose blood (ACCU-CHEK GUIDE) test strip Use 1 strip to test blood glucose 4 times daily. 100 each 5   . Misc. Devices MISC Dispense one maternity belt for patient 1 Device 0     Review of Systems  Constitutional: Negative.   HENT: Negative.   Eyes: Negative.   Respiratory: Negative.   Cardiovascular:        Elevated BP @ home and at fire station  Gastrointestinal: Negative.   Endocrine: Negative.   Genitourinary: Negative.   Musculoskeletal: Negative.   Skin: Negative.   Allergic/Immunologic: Negative.   Neurological: Negative.   Hematological: Negative.   Psychiatric/Behavioral: Negative.    Physical Exam   Patient Vitals for the past 24 hrs:  BP Pulse  04/16/19 1732 123/68 80  04/16/19 1716 128/75 85  04/16/19 1702 132/72 86  04/16/19 1647 128/71 85  04/16/19 1632 136/80 88  04/16/19 1617 133/66 88  04/16/19 1604 (!) 141/82 86  04/16/19 1546 (!) 145/106 95  04/16/19 1531 (!) 141/89 95  04/16/19 1521 (!) 151/103 99    Physical Exam  Nursing note and vitals reviewed. Constitutional: She is oriented to person, place, and time. She appears well-developed and well-nourished.  HENT:  Head: Normocephalic and atraumatic.  Eyes: Pupils are equal, round, and reactive to light.  Neck: Normal range of motion.  Cardiovascular: Normal rate, regular rhythm, normal heart sounds and intact distal pulses.  Respiratory: Effort normal and breath sounds normal.  GI: Soft. Bowel sounds are normal.  Musculoskeletal: Normal range of motion.  Neurological: She is alert and oriented to person, place, and time. She has normal reflexes.  Skin: Skin is warm and dry.  Psychiatric: She has a normal mood and affect. Her behavior is normal. Judgment and thought content normal.    NST - FHR: 145 bpm / moderate variability / accels present / decels absent / TOCO: none  MAU Course  Procedures  MDM CCUA CBC CMP P/C Ratio Serial BP's    Results for orders placed or performed during the hospital encounter of 04/16/19 (from the past 24 hour(s))  CBC     Status: None   Collection Time: 04/16/19  3:19 PM  Result Value Ref Range   WBC 9.9 4.0 - 10.5 K/uL   RBC 4.74 3.87 - 5.11 MIL/uL   Hemoglobin 13.5 12.0 - 15.0 g/dL   HCT 39.2 36.0 - 46.0 %   MCV 82.7 80.0 - 100.0 fL   MCH 28.5 26.0 - 34.0 pg    MCHC 34.4 30.0 - 36.0 g/dL   RDW 13.1 11.5 - 15.5 %   Platelets 278 150 - 400 K/uL   nRBC 0.0 0.0 - 0.2 %  Comprehensive metabolic panel     Status: Abnormal   Collection Time: 04/16/19  3:19 PM  Result Value Ref Range   Sodium 139 135 - 145 mmol/L   Potassium 4.7 3.5 - 5.1 mmol/L   Chloride 107 98 - 111 mmol/L   CO2 24 22 - 32 mmol/L   Glucose, Bld 83 70 - 99 mg/dL   BUN 8 6 - 20 mg/dL  Creatinine, Ser 0.66 0.44 - 1.00 mg/dL   Calcium 9.6 8.9 - 10.3 mg/dL   Total Protein 5.7 (L) 6.5 - 8.1 g/dL   Albumin 2.6 (L) 3.5 - 5.0 g/dL   AST 17 15 - 41 U/L   ALT 18 0 - 44 U/L   Alkaline Phosphatase 67 38 - 126 U/L   Total Bilirubin 0.5 0.3 - 1.2 mg/dL   GFR calc non Af Amer >60 >60 mL/min   GFR calc Af Amer >60 >60 mL/min   Anion gap 8 5 - 15  Urinalysis, Routine w reflex microscopic     Status: Abnormal   Collection Time: 04/16/19  3:34 PM  Result Value Ref Range   Color, Urine YELLOW YELLOW   APPearance CLEAR CLEAR   Specific Gravity, Urine 1.015 1.005 - 1.030   pH 6.0 5.0 - 8.0   Glucose, UA NEGATIVE NEGATIVE mg/dL   Hgb urine dipstick NEGATIVE NEGATIVE   Bilirubin Urine NEGATIVE NEGATIVE   Ketones, ur NEGATIVE NEGATIVE mg/dL   Protein, ur NEGATIVE NEGATIVE mg/dL   Nitrite NEGATIVE NEGATIVE   Leukocytes,Ua SMALL (A) NEGATIVE   RBC / HPF 0-5 0 - 5 RBC/hpf   WBC, UA 0-5 0 - 5 WBC/hpf   Bacteria, UA RARE (A) NONE SEEN   Squamous Epithelial / LPF 0-5 0 - 5  Protein / creatinine ratio, urine     Status: None   Collection Time: 04/16/19  3:34 PM  Result Value Ref Range   Creatinine, Urine 89.73 mg/dL   Total Protein, Urine 12 mg/dL   Protein Creatinine Ratio 0.13 0.00 - 0.15 mg/mg[Cre]    Assessment and Plan  Elevated blood pressure affecting pregnancy in third trimester, antepartum - Plan: Discharge patient - Advised to check BP daily, call for BP >140/90 - Advised to make provider she sees on Monday 04/19/19 that she was seen in MAU on today for elevated BP -  Reassurance given that PEC labs and BPs are WNL at time of d/c - Elevated BPs could be the beginning of developing gHTN or PEC - Reassurance that fetal well-being is good at the time of d/c  - Return to MAU this weekend for BP>140/90 - Discharge home - Keep scheduled appt with [facility] on Monday 04/19/19 - Patient verbalized an understanding of the plan of care and agrees.   Laury Deep, MSN, CNM 04/16/2019, 3:37 PM

## 2019-04-19 ENCOUNTER — Other Ambulatory Visit: Payer: Self-pay

## 2019-04-19 ENCOUNTER — Encounter (HOSPITAL_COMMUNITY): Payer: Self-pay

## 2019-04-19 ENCOUNTER — Telehealth: Payer: Medicaid Other | Admitting: Obstetrics

## 2019-04-19 ENCOUNTER — Inpatient Hospital Stay (HOSPITAL_COMMUNITY)
Admission: AD | Admit: 2019-04-19 | Discharge: 2019-04-23 | DRG: 833 | Disposition: A | Discharge status: Home / Self Care | Payer: Medicaid Other | Attending: Obstetrics and Gynecology | Admitting: Obstetrics and Gynecology

## 2019-04-19 ENCOUNTER — Telehealth (INDEPENDENT_AMBULATORY_CARE_PROVIDER_SITE_OTHER): Payer: Medicaid Other | Admitting: Obstetrics and Gynecology

## 2019-04-19 ENCOUNTER — Encounter: Payer: Self-pay | Admitting: Obstetrics and Gynecology

## 2019-04-19 VITALS — BP 185/115 | HR 92

## 2019-04-19 DIAGNOSIS — O1410 Severe pre-eclampsia, unspecified trimester: Secondary | ICD-10-CM | POA: Diagnosis present

## 2019-04-19 DIAGNOSIS — O1413 Severe pre-eclampsia, third trimester: Principal | ICD-10-CM | POA: Diagnosis present

## 2019-04-19 DIAGNOSIS — Z3A31 31 weeks gestation of pregnancy: Secondary | ICD-10-CM | POA: Diagnosis not present

## 2019-04-19 DIAGNOSIS — O26893 Other specified pregnancy related conditions, third trimester: Secondary | ICD-10-CM

## 2019-04-19 DIAGNOSIS — O24410 Gestational diabetes mellitus in pregnancy, diet controlled: Secondary | ICD-10-CM | POA: Diagnosis not present

## 2019-04-19 DIAGNOSIS — Z362 Encounter for other antenatal screening follow-up: Secondary | ICD-10-CM | POA: Diagnosis not present

## 2019-04-19 DIAGNOSIS — Z20828 Contact with and (suspected) exposure to other viral communicable diseases: Secondary | ICD-10-CM | POA: Diagnosis present

## 2019-04-19 DIAGNOSIS — O133 Gestational [pregnancy-induced] hypertension without significant proteinuria, third trimester: Secondary | ICD-10-CM | POA: Diagnosis not present

## 2019-04-19 DIAGNOSIS — Z87891 Personal history of nicotine dependence: Secondary | ICD-10-CM

## 2019-04-19 DIAGNOSIS — O139 Gestational [pregnancy-induced] hypertension without significant proteinuria, unspecified trimester: Secondary | ICD-10-CM

## 2019-04-19 DIAGNOSIS — E669 Obesity, unspecified: Secondary | ICD-10-CM | POA: Diagnosis present

## 2019-04-19 DIAGNOSIS — O24419 Gestational diabetes mellitus in pregnancy, unspecified control: Secondary | ICD-10-CM | POA: Diagnosis not present

## 2019-04-19 DIAGNOSIS — O2623 Pregnancy care for patient with recurrent pregnancy loss, third trimester: Secondary | ICD-10-CM | POA: Diagnosis not present

## 2019-04-19 DIAGNOSIS — R87610 Atypical squamous cells of undetermined significance on cytologic smear of cervix (ASC-US): Secondary | ICD-10-CM

## 2019-04-19 DIAGNOSIS — O99210 Obesity complicating pregnancy, unspecified trimester: Secondary | ICD-10-CM

## 2019-04-19 DIAGNOSIS — O99213 Obesity complicating pregnancy, third trimester: Secondary | ICD-10-CM | POA: Diagnosis present

## 2019-04-19 DIAGNOSIS — O1403 Mild to moderate pre-eclampsia, third trimester: Secondary | ICD-10-CM | POA: Diagnosis not present

## 2019-04-19 DIAGNOSIS — Z3490 Encounter for supervision of normal pregnancy, unspecified, unspecified trimester: Secondary | ICD-10-CM

## 2019-04-19 HISTORY — DX: Gestational (pregnancy-induced) hypertension without significant proteinuria, unspecified trimester: O13.9

## 2019-04-19 LAB — PROTEIN / CREATININE RATIO, URINE
Creatinine, Urine: 186.88 mg/dL
Protein Creatinine Ratio: 0.27 mg/mg{Cre} — ABNORMAL HIGH (ref 0.00–0.15)
Total Protein, Urine: 50 mg/dL

## 2019-04-19 LAB — CBC
HCT: 38.4 % (ref 36.0–46.0)
Hemoglobin: 13.4 g/dL (ref 12.0–15.0)
MCH: 28.8 pg (ref 26.0–34.0)
MCHC: 34.9 g/dL (ref 30.0–36.0)
MCV: 82.4 fL (ref 80.0–100.0)
Platelets: 287 10*3/uL (ref 150–400)
RBC: 4.66 MIL/uL (ref 3.87–5.11)
RDW: 13.2 % (ref 11.5–15.5)
WBC: 10.6 10*3/uL — ABNORMAL HIGH (ref 4.0–10.5)
nRBC: 0 % (ref 0.0–0.2)

## 2019-04-19 LAB — COMPREHENSIVE METABOLIC PANEL
ALT: 16 U/L (ref 0–44)
AST: 17 U/L (ref 15–41)
Albumin: 2.6 g/dL — ABNORMAL LOW (ref 3.5–5.0)
Alkaline Phosphatase: 61 U/L (ref 38–126)
Anion gap: 8 (ref 5–15)
BUN: 11 mg/dL (ref 6–20)
CO2: 21 mmol/L — ABNORMAL LOW (ref 22–32)
Calcium: 9.3 mg/dL (ref 8.9–10.3)
Chloride: 107 mmol/L (ref 98–111)
Creatinine, Ser: 0.63 mg/dL (ref 0.44–1.00)
GFR calc Af Amer: >60 mL/min (ref >60)
GFR calc non Af Amer: >60 mL/min (ref >60)
Glucose, Bld: 127 mg/dL — ABNORMAL HIGH (ref 70–99)
Potassium: 3.9 mmol/L (ref 3.5–5.1)
Sodium: 136 mmol/L (ref 135–145)
Total Bilirubin: 0.1 mg/dL — ABNORMAL LOW (ref 0.3–1.2)
Total Protein: 5.6 g/dL — ABNORMAL LOW (ref 6.5–8.1)

## 2019-04-19 LAB — TYPE AND SCREEN
ABO/RH(D): A POS
Antibody Screen: NEGATIVE

## 2019-04-19 LAB — ABO/RH: ABO/RH(D): A POS

## 2019-04-19 MED ORDER — ASPIRIN EC 81 MG PO TBEC
81.0000 mg | DELAYED_RELEASE_TABLET | Freq: Every day | ORAL | Status: DC
  Administered 2019-04-20 – 2019-04-23 (×4): 81 mg via ORAL
  Filled 2019-04-19 (×4): qty 1

## 2019-04-19 MED ORDER — LACTATED RINGERS IV SOLN
INTRAVENOUS | Status: DC
  Administered 2019-04-19 – 2019-04-20 (×2): via INTRAVENOUS
  Administered 2019-04-20: 75 mL/h via INTRAVENOUS

## 2019-04-19 MED ORDER — HYDRALAZINE HCL 20 MG/ML IJ SOLN: 10.0000 mg | INTRAMUSCULAR | Status: DC | PRN

## 2019-04-19 MED ORDER — LABETALOL HCL 5 MG/ML IV SOLN: 40.0000 mg | INTRAVENOUS | Status: DC | PRN

## 2019-04-19 MED ORDER — MAGNESIUM SULFATE BOLUS VIA INFUSION
4.0000 g | Freq: Once | INTRAVENOUS | Status: AC
  Administered 2019-04-19: 4 g via INTRAVENOUS
  Filled 2019-04-19: qty 1000

## 2019-04-19 MED ORDER — LABETALOL HCL 5 MG/ML IV SOLN: 20.0000 mg | INTRAVENOUS | Status: DC | PRN

## 2019-04-19 MED ORDER — LABETALOL HCL 5 MG/ML IV SOLN
20.0000 mg | INTRAVENOUS | Status: DC | PRN
  Administered 2019-04-19: 18:00:00 20 mg via INTRAVENOUS
  Filled 2019-04-19: qty 4

## 2019-04-19 MED ORDER — FLUOXETINE HCL 20 MG PO CAPS
40.0000 mg | ORAL_CAPSULE | Freq: Every day | ORAL | Status: DC
  Filled 2019-04-19: qty 2
  Filled 2019-04-19: qty 4
  Filled 2019-04-19: qty 2
  Filled 2019-04-19 (×2): qty 4
  Filled 2019-04-19 (×2): qty 2

## 2019-04-19 MED ORDER — PRENATAL MULTIVITAMIN CH
1.0000 | ORAL_TABLET | Freq: Every day | ORAL | Status: DC
  Administered 2019-04-20 – 2019-04-23 (×3): 1 via ORAL
  Filled 2019-04-19 (×3): qty 1

## 2019-04-19 MED ORDER — BETAMETHASONE SOD PHOS & ACET 6 (3-3) MG/ML IJ SUSP
12.0000 mg | INTRAMUSCULAR | Status: AC
  Administered 2019-04-19 – 2019-04-20 (×2): 12 mg via INTRAMUSCULAR
  Filled 2019-04-19 (×2): qty 5

## 2019-04-19 MED ORDER — LABETALOL HCL 5 MG/ML IV SOLN: 80.0000 mg | INTRAVENOUS | Status: DC | PRN

## 2019-04-19 MED ORDER — MAGNESIUM SULFATE 40 GM/1000ML IV SOLN
2.0000 g/h | INTRAVENOUS | Status: AC
  Administered 2019-04-19 – 2019-04-20 (×2): 2 g/h via INTRAVENOUS
  Filled 2019-04-19 (×2): qty 1000

## 2019-04-19 MED ORDER — DOCUSATE SODIUM 100 MG PO CAPS: 100.0000 mg | ORAL_CAPSULE | Freq: Two times a day (BID) | ORAL | Status: DC | PRN

## 2019-04-19 MED ORDER — ACETAMINOPHEN 325 MG PO TABS
650.0000 mg | ORAL_TABLET | ORAL | Status: DC | PRN
  Administered 2019-04-20 – 2019-04-23 (×4): 650 mg via ORAL
  Filled 2019-04-19 (×4): qty 2

## 2019-04-19 NOTE — Progress Notes (Signed)
TELEHEALTH OBSTETRICS PRENATAL VIRTUAL VIDEO VISIT ENCOUNTER NOTE  Provider location: [facility] [place]   I connected with Alexis Ellis on 04/19/19 at  2:30 PM EST by MyChart Video Encounter at home and verified that I am speaking with the correct person using two identifiers.   I discussed the limitations, risks, security and privacy concerns of performing an evaluation and management service virtually and the availability of in person appointments. I also discussed with the patient that there may be a patient responsible charge related to this service. The patient expressed understanding and agreed to proceed. Subjective:  Alexis Ellis is a 28 y.o. [name] at [redacted]w[redacted]d being seen today for ongoing prenatal care.  She is currently monitored for the following issues for this high-risk pregnancy and has Elevated blood pressure affecting pregnancy in third trimester, antepartum; ADD (attention deficit disorder); Depression affecting pregnancy in second trimester, antepartum; Tension headache; HLD (hyperlipidemia); Obesity (BMI 35.0-39.9 without comorbidity); Supervision of normal pregnancy, antepartum; History of ectopic pregnancy; History of multiple miscarriages; Abnormal Pap smear of cervix; Maternal obesity affecting pregnancy, antepartum; Diet controlled gestational diabetes mellitus (GDM) in third trimester; and Gestational hypertension on their problem list.  Patient reports vagina pressure.  Contractions: Not present. Vag. Bleeding: None.  Movement: Present. Denies any leaking of fluid.   The following portions of the patient's history were reviewed and updated as appropriate: allergies, current medications, past family history, past medical history, past social history, past surgical history and problem list.   Objective:   Vitals:   04/19/19 1439  BP: (!) 185/115  Pulse: 92  repeat 159/103  Fetal Status:     Movement: Present     General:  Alert, oriented  and cooperative. Patient is in no acute distress.  Respiratory: Normal respiratory effort, no problems with respiration noted  Mental Status: Normal mood and affect. Normal behavior. Normal judgment and thought content.  Rest of physical exam deferred due to type of encounter  Imaging: No results found.  Assessment and Plan:  Pregnancy: [name] at [redacted]w[redacted]d 1. Maternal obesity affecting pregnancy, antepartum  2. Encounter for supervision of normal pregnancy, antepartum, unspecified gravidity  3. Atypical squamous cells of undetermined significance on cytologic smear of cervix (ASC-US) Pp colpo  4. Diet controlled gestational diabetes mellitus (GDM) in third trimester Diet control FG: 90-92 PP: 120s, occasional outlier  5. Gestational hypertension, third trimester Dx today  Patient with newly diagnosed gestational hypertension and severe range BP today To MAU now for labs and rule out PEC  Preterm labor symptoms and general obstetric precautions including but not limited to vaginal bleeding, contractions, leaking of fluid and fetal movement were reviewed in detail with the patient. I discussed the assessment and treatment plan with the patient. The patient was provided an opportunity to ask questions and all were answered. The patient agreed with the plan and demonstrated an understanding of the instructions. The patient was advised to call back or seek an in-person office evaluation/go to MAU at [facility] for any urgent or concerning symptoms. Please refer to After Visit Summary for other counseling recommendations.   I provided 15 minutes of face-to-face time during this encounter.  Return in about 2 weeks (around 05/03/2019) for high OB, virtual.  Future Appointments  Date Time Provider [place]  04/22/2019  8:45 AM [place] [facility]  04/22/2019  8:45 AM [facility] [place] 2 [facility]-US    Alexis Ellis M Ling Flesch, [place] [facility]

## 2019-04-19 NOTE — MAU Note (Signed)
PT was sent from office for severe range pressures at [facility] visit today. Was here on Friday for same issues and was cleared to go home on precautions. Pt denies blurry vison, headache, swelling or LUQ.  Denies cntx, VB, LOF. +FM. A1GDM,.

## 2019-04-19 NOTE — MAU Provider Note (Signed)
History     CSN: [id number]  Arrival date and time: 04/19/19 1718   First Provider Initiated Contact with Patient 04/19/19 1819      Chief Complaint  Patient presents with  . Hypertension   HPI  Ms.  Alexis Ellis is a 28 y.o. year old [id number] female at 71w2dweeks gestation who presents to MAU after being seen in the office today where she had severe range BPs. She was seen in MAU on Friday 04/16/19 for the same issues. She denies blurry vision, H/A, swelling, or epigastric pain. She also denies UC's, VB or LOF. She reports good (+) FM.   Past Medical History:  Diagnosis Date  . ADD (attention deficit disorder)   . Chicken pox   . Depression   . Diet controlled gestational diabetes mellitus (GDM) in third trimester 03/23/2019  . Ectopic pregnancy   . Elevated blood pressure   . Elevated cholesterol   . Frequent headaches   . Gestational hypertension 04/19/2019  . History of kidney stones     Past Surgical History:  Procedure Laterality Date  . LAPAROSCOPY N/A 09/16/2012   Procedure: LAPAROSCOPY OPERATIVE;  Surgeon: JAllena Katz MD;  [facility];  Service: Gynecology;  Laterality: N/A;  Evacuation Hemoperitoneum  . UNILATERAL SALPINGECTOMY Right 09/16/2012   Procedure: UNILATERAL SALPINGECTOMY;  Surgeon: JAllena Katz MD;  [facility];  Service: Gynecology;  Laterality: Right;  . WISDOM TOOTH EXTRACTION      Family History  Problem Relation Age of Onset  . Hypertension Mother        Living  . Fibromyalgia Mother   . Diabetes Father 418      Deceased  . Heart attack Father   . Sudden death Father   . Bipolar disorder Maternal Grandmother   . Diabetes Maternal Grandfather   . Alcohol abuse Paternal Grandfather   . Liver disease Paternal Grandfather   . Hyperlipidemia Paternal Grandmother   . Kidney disease Paternal Grandmother   . Liver disease Paternal Grandmother   . Heart disease Paternal Grandmother   . Healthy Sister     Social  History   Tobacco Use  . Smoking status: Former Smoker    Packs/day: 0.00  . Smokeless tobacco: Never Used  Substance Use Topics  . Alcohol use: Not Currently    Alcohol/week: 0.0 standard drinks    Comment: social  . Drug use: No    Allergies: No Known Allergies  Medications Prior to Admission  Medication Sig Dispense Refill Last Dose  . acetaminophen (TYLENOL) 325 MG tablet Take 650 mg by mouth every 6 (six) hours as needed.   04/19/2019 at Unknown time  . aspirin EC 81 MG tablet Take 1 tablet (81 mg total) by mouth daily. Take after 12 weeks for prevention of preeclampsia later in pregnancy 300 tablet 2 04/19/2019 at Unknown time  . glucose blood (ACCU-CHEK GUIDE) test strip Use 1 strip to test blood glucose 4 times daily. 100 each 5 04/19/2019 at Unknown time  . Prenatal MV-Min-FA-Omega-3 (PRENATAL GUMMIES/DHA & FA) 0.4-32.5 MG CHEW Chew by mouth.   04/19/2019 at Unknown time  . Accu-Chek FastClix Lancets MISC 1 each by Percutaneous route 4 (four) times daily. 100 each 5   . Blood Pressure Monitor KIT 1 Device by Does not apply route once a week. To be monitored Regularly at home. 1 kit 0   . butalbital-acetaminophen-caffeine (FIORICET) 50-325-40 MG tablet Take 1-2 tablets by mouth every 6 (six)  hours as needed for headache. 20 tablet 0 More than a month at Unknown time  . FLUoxetine (PROZAC) 40 MG capsule Take 40 mg by mouth daily.     . Misc. Devices MISC Dispense one maternity belt for patient 1 Device 0     Review of Systems  Constitutional: Negative.   HENT: Negative.   Eyes: Negative.   Respiratory: Negative.   Cardiovascular: Positive for leg swelling.  Gastrointestinal: Negative.   Endocrine: Negative.   Genitourinary: Negative.   Musculoskeletal: Negative.   Skin: Negative.   Allergic/Immunologic: Negative.   Neurological: Negative.   Hematological: Negative.   Psychiatric/Behavioral: Negative.    Physical Exam   Patient Vitals for the past 24 hrs:  BP Temp  Temp src Pulse Resp SpO2 Height Weight  04/19/19 2001 (!) 146/89 - - 82 - - - -  04/19/19 1951 (!) 144/87 - - 78 - - - -  04/19/19 1931 118/70 - - 79 - - - -  04/19/19 1916 121/68 - - 75 - - - -  04/19/19 1901 120/67 - - 79 - - - -  04/19/19 1900 - - - - - 99 % - -  04/19/19 1845 121/71 - - 86 - 99 % - -  04/19/19 1823 (!) 124/56 - - 92 - - - -  04/19/19 1810 (!) 159/115 - - (!) 105 - 99 % - -  04/19/19 1755 (!) 175/104 - - 94 - 100 % - -  04/19/19 1735 (!) 158/100 97.9 F (36.6 C) Oral (!) 104 18 100 % 5' 5"  (1.651 m) 119.1 kg    Physical Exam  Nursing note and vitals reviewed. Constitutional: She is oriented to person, place, and time. She appears well-developed and well-nourished.  HENT:  Head: Normocephalic and atraumatic.  Eyes: Pupils are equal, round, and reactive to light.  Neck: Normal range of motion.  Cardiovascular: Normal rate.  Respiratory: Effort normal.  GI: Soft.  Genitourinary:    Genitourinary Comments: Pelvic deferred   Musculoskeletal:        General: Edema (2+ pitting edema) present.  Neurological: She is alert and oriented to person, place, and time. She has normal reflexes.  Skin: Skin is warm and dry.  Psychiatric: She has a normal mood and affect. Her behavior is normal. Judgment and thought content normal.    NST - FHR: 120 bpm / moderate variability / accels present / decels absent / TOCO: UI noted  MAU Course  Procedures  MDM CCUA CBC CMP P/C Ratio Serial BP's  Labetalol Protocol   Results for orders placed or performed during the hospital encounter of 04/19/19 (from the past 24 hour(s))  CBC     Status: Abnormal   Collection Time: 04/19/19  5:42 PM  Result Value Ref Range   WBC 10.6 (H) 4.0 - 10.5 K/uL   RBC 4.66 3.87 - 5.11 MIL/uL   Hemoglobin 13.4 12.0 - 15.0 g/dL   HCT 38.4 36.0 - 46.0 %   MCV 82.4 80.0 - 100.0 fL   MCH 28.8 26.0 - 34.0 pg   MCHC 34.9 30.0 - 36.0 g/dL   RDW 13.2 11.5 - 15.5 %   Platelets 287 150 - 400 K/uL    nRBC 0.0 0.0 - 0.2 %  Comprehensive metabolic panel     Status: Abnormal   Collection Time: 04/19/19  5:42 PM  Result Value Ref Range   Sodium 136 135 - 145 mmol/L   Potassium 3.9 3.5 - 5.1 mmol/L  Chloride 107 98 - 111 mmol/L   CO2 21 (L) 22 - 32 mmol/L   Glucose, Bld 127 (H) 70 - 99 mg/dL   BUN 11 6 - 20 mg/dL   Creatinine, Ser 0.63 0.44 - 1.00 mg/dL   Calcium 9.3 8.9 - 10.3 mg/dL   Total Protein 5.6 (L) 6.5 - 8.1 g/dL   Albumin 2.6 (L) 3.5 - 5.0 g/dL   AST 17 15 - 41 U/L   ALT 16 0 - 44 U/L   Alkaline Phosphatase 61 38 - 126 U/L   Total Bilirubin 0.1 (L) 0.3 - 1.2 mg/dL   GFR calc non Af Amer >60 >60 mL/min   GFR calc Af Amer >60 >60 mL/min   Anion gap 8 5 - 15  Protein / creatinine ratio, urine     Status: Abnormal   Collection Time: 04/19/19  6:14 PM  Result Value Ref Range   Creatinine, Urine 186.88 mg/dL   Total Protein, Urine 50 mg/dL   Protein Creatinine Ratio 0.27 (H) 0.00 - 0.15 mg/mg[Cre]      Assessment and Plan  Preeclampsia, severe, third trimester - Admit to OBSCU - Magnesium Sulfate 4 gm loading dose, then 2 gm/hr - BMZ 12 mg IM every 24 hrs x 2 doses - Plan growth U/S tomorrow - NICU consult tomorrow - GBS swab now - Dr. Ilda Basset to complete H&P    Alexis Deep, MSN, CNM 04/19/2019, 6:19 PM

## 2019-04-20 ENCOUNTER — Inpatient Hospital Stay (HOSPITAL_COMMUNITY): Payer: Medicaid Other

## 2019-04-20 DIAGNOSIS — O99213 Obesity complicating pregnancy, third trimester: Secondary | ICD-10-CM | POA: Diagnosis not present

## 2019-04-20 DIAGNOSIS — O2623 Pregnancy care for patient with recurrent pregnancy loss, third trimester: Secondary | ICD-10-CM | POA: Diagnosis not present

## 2019-04-20 DIAGNOSIS — Z3A31 31 weeks gestation of pregnancy: Secondary | ICD-10-CM

## 2019-04-20 DIAGNOSIS — O1410 Severe pre-eclampsia, unspecified trimester: Secondary | ICD-10-CM | POA: Diagnosis present

## 2019-04-20 DIAGNOSIS — O133 Gestational [pregnancy-induced] hypertension without significant proteinuria, third trimester: Secondary | ICD-10-CM

## 2019-04-20 DIAGNOSIS — O24410 Gestational diabetes mellitus in pregnancy, diet controlled: Secondary | ICD-10-CM

## 2019-04-20 DIAGNOSIS — Z362 Encounter for other antenatal screening follow-up: Secondary | ICD-10-CM

## 2019-04-20 DIAGNOSIS — O1413 Severe pre-eclampsia, third trimester: Secondary | ICD-10-CM | POA: Diagnosis not present

## 2019-04-20 LAB — CBC
HCT: 39.3 % (ref 36.0–46.0)
Hemoglobin: 13.2 g/dL (ref 12.0–15.0)
MCH: 28.2 pg (ref 26.0–34.0)
MCHC: 33.6 g/dL (ref 30.0–36.0)
MCV: 84 fL (ref 80.0–100.0)
Platelets: 287 10*3/uL (ref 150–400)
RBC: 4.68 MIL/uL (ref 3.87–5.11)
RDW: 13.2 % (ref 11.5–15.5)
WBC: 11 10*3/uL — ABNORMAL HIGH (ref 4.0–10.5)
nRBC: 0 % (ref 0.0–0.2)

## 2019-04-20 LAB — COMPREHENSIVE METABOLIC PANEL
ALT: 14 U/L (ref 0–44)
AST: 24 U/L (ref 15–41)
Albumin: 2.3 g/dL — ABNORMAL LOW (ref 3.5–5.0)
Alkaline Phosphatase: 63 U/L (ref 38–126)
Anion gap: 13 (ref 5–15)
BUN: 8 mg/dL (ref 6–20)
CO2: 20 mmol/L — ABNORMAL LOW (ref 22–32)
Calcium: 8.4 mg/dL — ABNORMAL LOW (ref 8.9–10.3)
Chloride: 101 mmol/L (ref 98–111)
Creatinine, Ser: 0.72 mg/dL (ref 0.44–1.00)
GFR calc Af Amer: >60 mL/min (ref >60)
GFR calc non Af Amer: >60 mL/min (ref >60)
Glucose, Bld: 164 mg/dL — ABNORMAL HIGH (ref 70–99)
Potassium: 4.4 mmol/L (ref 3.5–5.1)
Sodium: 134 mmol/L — ABNORMAL LOW (ref 135–145)
Total Bilirubin: 0.5 mg/dL (ref 0.3–1.2)
Total Protein: 5.4 g/dL — ABNORMAL LOW (ref 6.5–8.1)

## 2019-04-20 LAB — GLUCOSE, CAPILLARY
Glucose-Capillary: 135 mg/dL — ABNORMAL HIGH (ref 70–99)
Glucose-Capillary: 158 mg/dL — ABNORMAL HIGH (ref 70–99)
Glucose-Capillary: 134 mg/dL — ABNORMAL HIGH (ref 70–99)
Glucose-Capillary: 142 mg/dL — ABNORMAL HIGH (ref 70–99)

## 2019-04-20 LAB — SARS CORONAVIRUS 2 (TAT 6-24 HRS): SARS Coronavirus 2: NEGATIVE

## 2019-04-20 NOTE — Progress Notes (Signed)
Patient ID: Alexis Ellis, female   DOB: 1991-03-28, 28 y.o.   MRN: [id number] [place]) NOTE  Alexis Ellis is a 28 y.o. [id number] at [redacted]w[redacted]d  who is admitted for severe preeclampsia.    Fetal presentation is unsure. Length of Stay:  1  Days  Date of admission:04/19/2019  Subjective: Patient reports feeling well without complaints. She denies HA, visual changes, RUQ/epigastric pain, nausea/emesis Patient reports the fetal movement as active. Patient reports uterine contraction  activity as none. Patient reports  vaginal bleeding as none. Patient describes fluid per vagina as None.  Vitals:  Blood pressure (!) 143/79, pulse 87, temperature 97.9 F (36.6 C), temperature source Oral, resp. rate 18, height 5\' 5"  (1.651 m), weight 119.1 kg, last menstrual period 09/12/2018, SpO2 98 %, unknown if currently breastfeeding. Vitals:   04/20/19 0555 04/20/19 0600 04/20/19 0605 04/20/19 0751  BP:  130/76  (!) 143/79  Pulse:  88  87  Resp:  18  18  Temp:    97.9 F (36.6 C)  TempSrc:    Oral  SpO2: 99% 99% 98% 98%  Weight:      Height:       Physical Examination: GENERAL: Well-developed, well-nourished female in no acute distress.  LUNGS: Clear to auscultation bilaterally.  HEART: Regular rate and rhythm. ABDOMEN: Soft, nontender, gravid PELVIC: Not performed EXTREMITIES: No cyanosis, clubbing, or edema, 2+ distal pulses.    Fetal Monitoring:  Baseline: 125 bpm, Variability: Good {> 6 bpm), Accelerations: Reactive and Decelerations: Absent     Labs:  Results for orders placed or performed during the hospital encounter of 04/19/19 (from the past 24 hour(s))  CBC   Collection Time: 04/19/19  5:42 PM  Result Value Ref Range   WBC 10.6 (H) 4.0 - 10.5 K/uL   RBC 4.66 3.87 - 5.11 MIL/uL   Hemoglobin 13.4 12.0 - 15.0 g/dL   HCT 38.4 36.0 - 46.0 %   MCV 82.4 80.0 - 100.0 fL   MCH 28.8 26.0 - 34.0 pg   MCHC 34.9 30.0 - 36.0 g/dL   RDW 13.2 11.5 - 15.5 %    Platelets 287 150 - 400 K/uL   nRBC 0.0 0.0 - 0.2 %  Comprehensive metabolic panel   Collection Time: 04/19/19  5:42 PM  Result Value Ref Range   Sodium 136 135 - 145 mmol/L   Potassium 3.9 3.5 - 5.1 mmol/L   Chloride 107 98 - 111 mmol/L   CO2 21 (L) 22 - 32 mmol/L   Glucose, Bld 127 (H) 70 - 99 mg/dL   BUN 11 6 - 20 mg/dL   Creatinine, Ser 0.63 0.44 - 1.00 mg/dL   Calcium 9.3 8.9 - 10.3 mg/dL   Total Protein 5.6 (L) 6.5 - 8.1 g/dL   Albumin 2.6 (L) 3.5 - 5.0 g/dL   AST 17 15 - 41 U/L   ALT 16 0 - 44 U/L   Alkaline Phosphatase 61 38 - 126 U/L   Total Bilirubin 0.1 (L) 0.3 - 1.2 mg/dL   GFR calc non Af Amer >60 >60 mL/min   GFR calc Af Amer >60 >60 mL/min   Anion gap 8 5 - 15  Type and screen [place]   Collection Time: 04/19/19  5:43 PM  Result Value Ref Range   ABO/RH(D) A POS    Antibody Screen NEG    Sample Expiration      04/22/2019,2359 Performed at [facility], [address] [place], [place] [address]   ABO/Rh   Collection Time: 04/19/19  5:43 PM  Result Value Ref Range   ABO/RH(D)      A POS Performed at [facility] [address] [place],  [address]   Protein / creatinine ratio, urine   Collection Time: 04/19/19  6:14 PM  Result Value Ref Range   Creatinine, Urine 186.88 mg/dL   Total Protein, Urine 50 mg/dL   Protein Creatinine Ratio 0.27 (H) 0.00 - 0.15 mg/mg[Cre]  SARS CORONAVIRUS 2 (TAT 6-24 HRS) Nasopharyngeal Nasopharyngeal Swab   Collection Time: 04/19/19  8:43 PM   Specimen: Nasopharyngeal Swab  Result Value Ref Range   SARS Coronavirus 2 NEGATIVE NEGATIVE  Comprehensive metabolic panel   Collection Time: 04/20/19  5:37 AM  Result Value Ref Range   Sodium 134 (L) 135 - 145 mmol/L   Potassium 4.4 3.5 - 5.1 mmol/L   Chloride 101 98 - 111 mmol/L   CO2 20 (L) 22 - 32 mmol/L   Glucose, Bld 164 (H) 70 - 99 mg/dL   BUN 8 6 - 20 mg/dL   Creatinine, Ser 0.72 0.44 - 1.00 mg/dL   Calcium 8.4 (L) 8.9 - 10.3  mg/dL   Total Protein 5.4 (L) 6.5 - 8.1 g/dL   Albumin 2.3 (L) 3.5 - 5.0 g/dL   AST 24 15 - 41 U/L   ALT 14 0 - 44 U/L   Alkaline Phosphatase 63 38 - 126 U/L   Total Bilirubin 0.5 0.3 - 1.2 mg/dL   GFR calc non Af Amer >60 >60 mL/min   GFR calc Af Amer >60 >60 mL/min   Anion gap 13 5 - 15  CBC   Collection Time: 04/20/19  5:37 AM  Result Value Ref Range   WBC 11.0 (H) 4.0 - 10.5 K/uL   RBC 4.68 3.87 - 5.11 MIL/uL   Hemoglobin 13.2 12.0 - 15.0 g/dL   HCT 39.3 36.0 - 46.0 %   MCV 84.0 80.0 - 100.0 fL   MCH 28.2 26.0 - 34.0 pg   MCHC 33.6 30.0 - 36.0 g/dL   RDW 13.2 11.5 - 15.5 %   Platelets 287 150 - 400 K/uL   nRBC 0.0 0.0 - 0.2 %  Glucose, capillary   Collection Time: 04/20/19  5:55 AM  Result Value Ref Range   Glucose-Capillary 135 (H) 70 - 99 mg/dL    Imaging Studies:    No results found.   Medications:  Scheduled . aspirin EC  81 mg Oral Daily  . betamethasone acetate-betamethasone sodium phosphate  12 mg Intramuscular Q24 Hr x 2  . FLUoxetine  40 mg Oral Daily  . prenatal multivitamin  1 tablet Oral Q1200   I have reviewed the patient's current medications.  ASSESSMENT:  Patient Active Problem List   Diagnosis Date Noted  . Severe pre-eclampsia 04/20/2019  . Gestational hypertension 04/19/2019  . Diet controlled gestational diabetes mellitus (GDM) in third trimester 03/23/2019  . Maternal obesity affecting pregnancy, antepartum 12/28/2018  . Abnormal Pap smear of cervix 12/05/2018  . Supervision of normal pregnancy, antepartum 11/30/2018  . History of ectopic pregnancy 11/30/2018  . History of multiple miscarriages 11/30/2018  . Tension headache 09/29/2015  . HLD (hyperlipidemia) 09/29/2015  . Obesity (BMI 35.0-39.9 without comorbidity) 09/29/2015  . ADD (attention deficit disorder) 11/07/2013  . Depression affecting pregnancy in second trimester, antepartum 11/07/2013  . Elevated blood pressure affecting pregnancy in third trimester, antepartum 11/01/2013     PLAN: -  Patient to complete second dose of BMZ this evening - Continue magnesium sulfate until completion of second BMZ - Follow up growth ultrasound - NICU consult - Delivery by 34 weeks or worsening maternal/fetal status - Continue current antepartum care  Demosthenes Virnig 04/20/2019,8:51 AM

## 2019-04-20 NOTE — Progress Notes (Signed)
24hr Urine started 12/7 @ 2300 to end 12/8 2300.

## 2019-04-20 NOTE — H&P (Signed)
Obstetrics Admission History & Physical  04/19/2019 - 8:51 PM Primary OBGYN: [facility]  Chief Complaint: severe pre-eclampsia (BPs) History of Present Illness  28 y.o. [id number] @ [redacted]w[redacted]d with the above CC. Pregnancy complicated by: recent gHTN dx, BMI 40s, h/o RS for ectopic, migraines, GDMa1.  Patient had virtual visit today and severe BPs noted, otherwise asymptomatic, so patient sent to MAU for eval. She was here a few days ago for elevated BPs that didn't need meds to resolve. Today, she received a dose of IV labetalol in order to bring down her BPs. Her labs were normal.   Currently, patient continues to be asymptomatic with no HA, visual changes, abdominal pain, chest pain, sob.   Review of Systems: as noted in the History of Present Illness.  PMHx:  Past Medical History:  Diagnosis Date  . ADD (attention deficit disorder)   . Chicken pox   . Depression   . Diet controlled gestational diabetes mellitus (GDM) in third trimester 03/23/2019  . Ectopic pregnancy   . Elevated blood pressure   . Elevated cholesterol   . Frequent headaches   . Gestational hypertension 04/19/2019  . History of kidney stones    PSHx:  Past Surgical History:  Procedure Laterality Date  . LAPAROSCOPY N/A 09/16/2012   Procedure: LAPAROSCOPY OPERATIVE;  Surgeon: JAllena Katz MD;  [facility];  Service: Gynecology;  Laterality: N/A;  Evacuation Hemoperitoneum  . UNILATERAL SALPINGECTOMY Right 09/16/2012   Procedure: UNILATERAL SALPINGECTOMY;  Surgeon: JAllena Katz MD;  [facility];  Service: Gynecology;  Laterality: Right;  . WISDOM TOOTH EXTRACTION     Medications:  Medications Prior to Admission  Medication Sig Dispense Refill Last Dose  . acetaminophen (TYLENOL) 325 MG tablet Take 650 mg by mouth every 6 (six) hours as needed.   04/19/2019 at Unknown time  . aspirin EC 81 MG tablet Take 1 tablet (81 mg total) by mouth daily. Take after 12 weeks for prevention of preeclampsia later in  pregnancy 300 tablet 2 04/19/2019 at Unknown time  . glucose blood (ACCU-CHEK GUIDE) test strip Use 1 strip to test blood glucose 4 times daily. 100 each 5 04/19/2019 at Unknown time  . Prenatal MV-Min-FA-Omega-3 (PRENATAL GUMMIES/DHA & FA) 0.4-32.5 MG CHEW Chew by mouth.   04/19/2019 at Unknown time  . Accu-Chek FastClix Lancets MISC 1 each by Percutaneous route 4 (four) times daily. 100 each 5   . Blood Pressure Monitor KIT 1 Device by Does not apply route once a week. To be monitored Regularly at home. 1 kit 0   . butalbital-acetaminophen-caffeine (FIORICET) 50-325-40 MG tablet Take 1-2 tablets by mouth every 6 (six) hours as needed for headache. 20 tablet 0 More than a month at Unknown time  . FLUoxetine (PROZAC) 40 MG capsule Take 40 mg by mouth daily.     . Misc. Devices MISC Dispense one maternity belt for patient 1 Device 0      Allergies: has No Known Allergies. OBHx:  OB History  Gravida Para Term Preterm AB Living  5       4    SAB TAB Ectopic Multiple Live Births  3   1        # Outcome Date GA Lbr Len/2nd Weight Sex Delivery Anes PTL Lv  5 Current           4 Ectopic 2015             Birth Comments: System Generated. Please review and  update pregnancy details.     Complications: H/O unilateral salpingectomy  3 SAB           2 SAB           1 SAB                    FHx:  Family History  Problem Relation Age of Onset  . Hypertension Mother        Living  . Fibromyalgia Mother   . Diabetes Father 68       Deceased  . Heart attack Father   . Sudden death Father   . Bipolar disorder Maternal Grandmother   . Diabetes Maternal Grandfather   . Alcohol abuse Paternal Grandfather   . Liver disease Paternal Grandfather   . Hyperlipidemia Paternal Grandmother   . Kidney disease Paternal Grandmother   . Liver disease Paternal Grandmother   . Heart disease Paternal Grandmother   . Healthy Sister    Soc Hx:  Social History   Socioeconomic History  . Marital status:  Single    Spouse name: Not on file  . Number of children: Not on file  . Years of education: Not on file  . Highest education level: Not on file  Occupational History  . Not on file  Social Needs  . Financial resource strain: Not on file  . Food insecurity    Worry: Not on file    Inability: Not on file  . Transportation needs    Medical: Not on file    Non-medical: Not on file  Tobacco Use  . Smoking status: Former Smoker    Packs/day: 0.00  . Smokeless tobacco: Never Used  Substance and Sexual Activity  . Alcohol use: Not Currently    Alcohol/week: 0.0 standard drinks    Comment: social  . Drug use: No  . Sexual activity: Yes    Partners: Male    Birth control/protection: None  Lifestyle  . Physical activity    Days per week: Not on file    Minutes per session: Not on file  . Stress: Not on file  Relationships  . Social Herbalist on phone: Not on file    Gets together: Not on file    Attends religious service: Not on file    Active member of club or organization: Not on file    Attends meetings of clubs or organizations: Not on file    Relationship status: Not on file  . Intimate partner violence    Fear of current or ex partner: Not on file    Emotionally abused: Not on file    Physically abused: Not on file    Forced sexual activity: Not on file  Other Topics Concern  . Not on file  Social History Narrative  . Not on file    Objective    Current Vital Signs 24h Vital Sign Ranges  T 98.7 F (37.1 C) Temp  Avg: 98.3 F (36.8 C)  Min: 97.9 F (36.6 C)  Max: 98.7 F (37.1 C)  BP 130/76 BP  Min: 118/70  Max: 185/115  HR 88 Pulse  Avg: 86.7  Min: 75  Max: 105  RR 18 Resp  Avg: 17.7  Min: 16  Max: 18  SaO2 99 %   SpO2  Avg: 99 %  Min: 98 %  Max: 100 %       24 Hour I/O Current Shift I/O  Time Ins Outs 12/07  0701 - 12/08 0700 In: 1598.3 [P.O.:550; I.V.:1048.3] Out: 4158 [XENMM:7680] 12/07 1901 - 12/08 0700 In: 1598.3 [P.O.:550;  I.V.:1048.3] Out: 1550 [Urine:1550]   EFM: 140 baseline, +accels, no decel, mod variability. Reactive NST  Toco: quiet  General: Well nourished, well developed female in no acute distress.  Skin:  Warm and dry.  Cardiovascular: S1, S2 normal, no murmur, rub or gallop, regular rate and rhythm Respiratory:  Clear to auscultation bilateral. Normal respiratory effort Abdomen: obese, gravid, nttp Neuro/Psych:  Normal mood and affect.   Labs  Recent Labs  Lab 04/16/19 1519 04/19/19 1742  WBC 9.9 10.6*  HGB 13.5 13.4  HCT 39.2 38.4  PLT 278 287    Recent Labs  Lab 04/16/19 1519 04/19/19 1742  NA 139 136  K 4.7 3.9  CL 107 107  CO2 24 21*  BUN 8 11  CREATININE 0.66 0.63  CALCIUM 9.6 9.3  PROT 5.7* 5.6*  BILITOT 0.5 0.1*  ALKPHOS 67 61  ALT 18 16  AST 17 17  GLUCOSE 83 127*    Radiology No new imaging   Assessment & Plan   28 y.o. S8P1031 @ 21w2dwith severe pre-eclampsia based on BPs; pt stable *Admit to Ante *Pregnancy: routine care *Severe pre-eclampsia: start Mg x 24 hours, rpt labs in morning; 24 hour urine ordered. D/w pt re: inpatient admission, goal for 34wk delivery, indications for delivery *Preterm: BMZ course. Rpt growth for tomorrow and have NICU come see after that *GDMa1: am fasting and 2 hour post prandial CBGs.  *GBS: swab ordered *Analgesia: no current needs *Dispo: see above.   CDurene RomansMD Attending Center for [facility]

## 2019-04-20 NOTE — Progress Notes (Signed)
Inpatient Diabetes Program Recommendations  Inpatient Diabetes Program Recommendations  Diabetes Treatment Program Recommendations  ADA Standards of Care 2018 Diabetes in Pregnancy Target Glucose Ranges:  Fasting: 60 - 90 mg/dL Preprandial: 60 - 105 mg/dL 1 hr postprandial: Less than 140mg /dL (from first bite of meal) 2 hr postprandial: Less than 120 mg/dL (from first bite of meal)    Lab Results  Component Value Date   GLUCAP 135 (H) 04/20/2019   HGBA1C 5.1 11/01/2013    Review of Glycemic Control Results for CORY, SZYMANOWSKI (MRN [id number]) as of 04/20/2019 08:56  Ref. Range 04/20/2019 05:55  Glucose-Capillary Latest Ref Range: 70 - 99 mg/dL 135 (H)   Diabetes history: GDM Outpatient Diabetes medications: none Current orders for Inpatient glycemic control: none BMZX1  Inpatient Diabetes Program Recommendations:    Consider adding Novolog 0-14 units TID (after meals) under the Diabetes treatment for Pregnant/Postpartum patients.  Thanks, Bronson Curb, MSN, RNC-OB Diabetes Coordinator [phone] (8a-5p)

## 2019-04-21 DIAGNOSIS — Z3A31 31 weeks gestation of pregnancy: Secondary | ICD-10-CM | POA: Diagnosis not present

## 2019-04-21 DIAGNOSIS — O24410 Gestational diabetes mellitus in pregnancy, diet controlled: Secondary | ICD-10-CM | POA: Diagnosis not present

## 2019-04-21 DIAGNOSIS — O1413 Severe pre-eclampsia, third trimester: Secondary | ICD-10-CM | POA: Diagnosis not present

## 2019-04-21 LAB — CREATININE CLEARANCE, URINE, 24 HOUR
Collection Interval-CRCL: 24 hours
Creatinine Clearance: 186 mL/min — ABNORMAL HIGH (ref 75–115)
Creatinine, 24H Ur: 1927 mg/d — ABNORMAL HIGH (ref 600–1800)
Creatinine, Urine: 77.08 mg/dL
Urine Total Volume-CRCL: 2500 mL

## 2019-04-21 LAB — PROTEIN, URINE, 24 HOUR
Collection Interval-UPROT: 24 hours
Protein, 24H Urine: 450 mg/d — ABNORMAL HIGH (ref 50–100)
Protein, Urine: 18 mg/dL
Urine Total Volume-UPROT: 2500 mL

## 2019-04-21 LAB — GLUCOSE, CAPILLARY
Glucose-Capillary: 162 mg/dL — ABNORMAL HIGH (ref 70–99)
Glucose-Capillary: 192 mg/dL — ABNORMAL HIGH (ref 70–99)
Glucose-Capillary: 117 mg/dL — ABNORMAL HIGH (ref 70–99)
Glucose-Capillary: 147 mg/dL — ABNORMAL HIGH (ref 70–99)

## 2019-04-21 MED ORDER — INSULIN ASPART 100 UNIT/ML ~~LOC~~ SOLN
0.0000 [IU] | Freq: Three times a day (TID) | SUBCUTANEOUS | Status: DC
  Administered 2019-04-21: 1 [IU] via SUBCUTANEOUS
  Administered 2019-04-21: 2 [IU] via SUBCUTANEOUS
  Administered 2019-04-21: 3 [IU] via SUBCUTANEOUS

## 2019-04-21 MED ORDER — SODIUM CHLORIDE 0.9% FLUSH
3.0000 mL | Freq: Two times a day (BID) | INTRAVENOUS | Status: DC
  Administered 2019-04-21 – 2019-04-22 (×2): 3 mL via INTRAVENOUS

## 2019-04-21 NOTE — Progress Notes (Addendum)
Patient ID: Alexis Ellis, female   DOB: Aug 17, 1990, 28 y.o.   MRN: [id number] [place]) NOTE  Alexis Ellis is a 28 y.o. [id number] at [redacted]w[redacted]d  who is admitted for severe preeclampsia.    Fetal presentation is unsure. Length of Stay:  2  Days  Date of admission:04/19/2019  Subjective: Patient reports feeling well without complaints. She denies HA, visual changes, RUQ/epigastric pain, nausea/emesis Patient reports the fetal movement as active. Patient reports uterine contraction  activity as none. Patient reports  vaginal bleeding as none. Patient describes fluid per vagina as None.  Vitals:  Blood pressure 119/60, pulse 81, temperature 97.8 F (36.6 C), temperature source Oral, resp. rate 18, height 5\' 5"  (1.651 m), weight 119.1 kg, last menstrual period 09/12/2018, SpO2 99 %, unknown if currently breastfeeding. Vitals:   04/20/19 1939 04/20/19 2318 04/21/19 0625 04/21/19 0820  BP: 139/71 (!) 142/77 126/62 119/60  Pulse: 86 84 78 81  Resp: 18 20 18 18   Temp: 98 F (36.7 C) 98.1 F (36.7 C) 98.2 F (36.8 C) 97.8 F (36.6 C)  TempSrc: Oral Oral Oral Oral  SpO2: 99% 98% 98% 99%  Weight:      Height:       Physical Examination: GENERAL: Well-developed, well-nourished female in no acute distress.  LUNGS: Clear to auscultation bilaterally.  HEART: Regular rate and rhythm. ABDOMEN: Soft, nontender, gravid PELVIC: Not performed EXTREMITIES: No cyanosis, clubbing, or edema, 2+ distal pulses.    Fetal Monitoring:  Baseline: 135 bpm, Variability: Good {> 6 bpm), Accelerations: Reactive and Decelerations: Absent     Labs:  Results for orders placed or performed during the hospital encounter of 04/19/19 (from the past 24 hour(s))  Glucose, capillary   Collection Time: 04/20/19  2:57 PM  Result Value Ref Range   Glucose-Capillary 134 (H) 70 - 99 mg/dL  Glucose, capillary   Collection Time: 04/20/19 11:24 PM  Result Value Ref Range    Glucose-Capillary 142 (H) 70 - 99 mg/dL  Glucose, capillary   Collection Time: 04/21/19  6:23 AM  Result Value Ref Range   Glucose-Capillary 162 (H) 70 - 99 mg/dL  Glucose, capillary   Collection Time: 04/21/19 10:32 AM  Result Value Ref Range   Glucose-Capillary 192 (H) 70 - 99 mg/dL    Imaging Studies:    [place] [facility] Ob Follow Up  Result Date: 04/20/2019 ----------------------------------------------------------------------  OBSTETRICS REPORT                       (Signed Final 04/20/2019 10:10 am) ---------------------------------------------------------------------- Patient Info  ID #:       [id number]                          D.O.B.:  28-Jan-1991 (28 yrs)  Name:       Alexis Ellis                Visit Date: 04/20/2019 07:49 am ---------------------------------------------------------------------- Performed By  Performed By:     Novella Rob        Ref. Address:     Faculty                    RDMS  Attending:        Johnell Comings MD         [facility]  Referred By:      Vickii Chafe                    Shaylynn Nulty MD ---------------------------------------------------------------------- Orders   #  Description                          Code         Ordered By   1  [place] [facility] OB FOLLOW UP                  [id number]     CHARLIE PICKENS  ----------------------------------------------------------------------   #  Order #                    Accession #                 Episode #   1  [id number]                  [id number]                  [id number]  ---------------------------------------------------------------------- Indications   Obesity complicating pregnancy (pregravid      O99.210 E66.9   BMI 40)   Hypertension - Gestational (IV Labetalol)      O13.9   Poor obstetric history-Recurrent (habitual)    O26.20   abortion (4 consecutive ab's)   Encounter for other antenatal screening        Z36.2   follow-up   Gestational  diabetes in pregnancy, diet        O24.410   controlled   [redacted] weeks gestation of pregnancy                Z3A.31  ---------------------------------------------------------------------- Vital Signs  Weight (lb): 262                               Height:        5'5"  BMI:         43.59 ---------------------------------------------------------------------- Fetal Evaluation  Num Of Fetuses:         1  Fetal Heart Rate(bpm):  135  Cardiac Activity:       Observed  Presentation:           Cephalic  Placenta:               Anterior  P. Cord Insertion:      Previously Visualized  Amniotic Fluid  AFI FV:      Within normal limits  AFI Sum(cm)     %Tile       Largest Pocket(cm)  11.68           28          4.95  RUQ(cm)       RLQ(cm)       LUQ(cm)        LLQ(cm)  4.95          0             2.14           4.59 ---------------------------------------------------------------------- Biometry  BPD:      80.7  mm     G. Age:  32w 3d         70  %    CI:        80.19   %    70 -  86                                                          FL/HC:      20.5   %    19.3 - 21.3  HC:      284.7  mm     G. Age:  31w 2d         12  %    HC/AC:      1.06        0.96 - 1.17  AC:      267.8  mm     G. Age:  30w 6d         31  %    FL/BPD:     72.2   %    71 - 87  FL:       58.3  mm     G. Age:  30w 3d         14  %    FL/AC:      21.8   %    20 - 24  HUM:      52.1  mm     G. Age:  30w 3d         30  %  Est. FW:    1663  gm    3 lb 11 oz      23  % ---------------------------------------------------------------------- OB History  Gravidity:    5         Term:   0        Prem:   0        SAB:   3  Ectopic:      1        Living:  0 ---------------------------------------------------------------------- Gestational Age  LMP:           31w 3d        Date:  09/12/18                 EDD:   06/19/19  U/S Today:     31w 2d                                        EDD:   06/20/19  Best:          31w 3d     Det. By:  LMP  (09/12/18)          EDD:    06/19/19 ---------------------------------------------------------------------- Anatomy  Cranium:               Appears normal         LVOT:                   Previously seen  Cavum:                 Previously seen        Aortic Arch:            Previously seen  Ventricles:            Previously seen        Ductal Arch:            limited  views prev  Choroid Plexus:        Previously seen        Diaphragm:              Appears normal  Cerebellum:            Previously seen        Stomach:                Appears normal, left                                                                        sided  Posterior Fossa:       Previously seen        Abdomen:                Appears normal  Nuchal Fold:           Previously seen        Abdominal Wall:         Previously seen  Face:                  Orbits and profile     Cord Vessels:           Previously seen                         previously seen  Lips:                  Previously seen        Kidneys:                Appear normal  Palate:                Previously seen        Bladder:                Appears normal  Thoracic:              Appears normal         Spine:                  Previously seen  Heart:                 Previously seen        Upper Extremities:      Previously seen  RVOT:                  Previously seen        Lower Extremities:      Previously seen  Other:  Female gender. Nasal bone prev visualized. 5th digit prev visualized.          Technically difficult due to maternal habitus and fetal position. ---------------------------------------------------------------------- Cervix Uterus Adnexa  Cervix  Not visualized (advanced GA >24wks) ---------------------------------------------------------------------- Comments  This patient is currently hospitalized due to newly diagnosed  hypertension and possible severe preeclampsia.  She is  receiving a complete course of antenatal corticosteroids is  currently on magnesium sulfate for maternal seizure   prophylaxis.  The fetal growth and amniotic fluid level appears appropriate  for her gestational age. ----------------------------------------------------------------------  Johnell Comings, MD Electronically Signed Final Report   04/20/2019 10:10 am ----------------------------------------------------------------------    Medications:  Scheduled . aspirin EC  81 mg Oral Daily  . FLUoxetine  40 mg Oral Daily  . insulin aspart  0-14 Units Subcutaneous TID PC  . prenatal multivitamin  1 tablet Oral Q1200   I have reviewed the patient's current medications.  ASSESSMENT:  Patient Active Problem List   Diagnosis Date Noted  . Severe pre-eclampsia 04/20/2019  . Gestational hypertension 04/19/2019  . Diet controlled gestational diabetes mellitus (GDM) in third trimester 03/23/2019  . Maternal obesity affecting pregnancy, antepartum 12/28/2018  . Abnormal Pap smear of cervix 12/05/2018  . Supervision of normal pregnancy, antepartum 11/30/2018  . History of ectopic pregnancy 11/30/2018  . History of multiple miscarriages 11/30/2018  . Tension headache 09/29/2015  . HLD (hyperlipidemia) 09/29/2015  . Obesity (BMI 35.0-39.9 without comorbidity) 09/29/2015  . ADD (attention deficit disorder) 11/07/2013  . Depression affecting pregnancy in second trimester, antepartum 11/07/2013  . Elevated blood pressure affecting pregnancy in third trimester, antepartum 11/01/2013    PLAN: - Patient completed BMZ - s/p magnesium sulfate for seizure and CP prophylaxis - BP has been stable since admission. Patient remains asymptomatic. Diagnosis revised to preeclampsia without severe features. Case discussed with MFM Dr. Donalee Citrin - Normal growth ultrasound - NICU consult pending - Elevated CBG post BMZ will start insulin per diabetic coordinator - Continue current antepartum care  Mollee Neer 04/21/2019,11:16 AM

## 2019-04-21 NOTE — Progress Notes (Signed)
Inpatient Diabetes Program Recommendations  AACE/ADA: New Consensus Statement on Inpatient Glycemic Control (2015)  Target Ranges:  Prepandial:   less than 140 mg/dL      Peak postprandial:   less than 180 mg/dL (1-2 hours)      Critically ill patients:  140 - 180 mg/dL   Lab Results  Component Value Date   GLUCAP 162 (H) 04/21/2019   HGBA1C 5.1 11/01/2013    Review of Glycemic Control Results for MAKENZEE, MATHE (MRN [id number]) as of 04/21/2019 09:38  Ref. Range 04/20/2019 14:57 04/20/2019 23:24 04/21/2019 06:23  Glucose-Capillary Latest Ref Range: 70 - 99 mg/dL 134 (H) 142 (H) 162 (H)   Diabetes history: GDM Outpatient Diabetes medications: none Current orders for Inpatient glycemic control: novolog 0-14 units TID BMZX2  Inpatient Diabetes Program Recommendations:    Noted consult and insulin changes. In agreement. Hopeful that now steroids completed, trends will decrease. However, If trends continue to exceed 120's mg/dL, consider adding NPH 12 units BID.  Thanks, Bronson Curb, MSN, RNC-OB Diabetes Coordinator [phone] (8a-5p)]

## 2019-04-22 ENCOUNTER — Other Ambulatory Visit: Payer: Self-pay

## 2019-04-22 ENCOUNTER — Ambulatory Visit (HOSPITAL_COMMUNITY): Payer: Medicaid Other | Attending: Obstetrics

## 2019-04-22 ENCOUNTER — Encounter (HOSPITAL_COMMUNITY): Payer: Self-pay

## 2019-04-22 ENCOUNTER — Encounter (HOSPITAL_COMMUNITY): Payer: Self-pay | Admitting: Obstetrics and Gynecology

## 2019-04-22 ENCOUNTER — Ambulatory Visit (HOSPITAL_COMMUNITY)
Admission: RE | Admit: 2019-04-22 | Payer: Medicaid Other | Source: Ambulatory Visit | Attending: Advanced Practice Midwife | Admitting: Advanced Practice Midwife

## 2019-04-22 DIAGNOSIS — O24419 Gestational diabetes mellitus in pregnancy, unspecified control: Secondary | ICD-10-CM

## 2019-04-22 DIAGNOSIS — O1403 Mild to moderate pre-eclampsia, third trimester: Secondary | ICD-10-CM

## 2019-04-22 LAB — CULTURE, BETA STREP (GROUP B ONLY)

## 2019-04-22 LAB — GLUCOSE, CAPILLARY
Glucose-Capillary: 87 mg/dL (ref 70–99)
Glucose-Capillary: 72 mg/dL (ref 70–99)
Glucose-Capillary: 75 mg/dL (ref 70–99)
Glucose-Capillary: 80 mg/dL (ref 70–99)

## 2019-04-22 MED ORDER — INSULIN NPH (HUMAN) (ISOPHANE) 100 UNIT/ML ~~LOC~~ SUSP
12.0000 [IU] | Freq: Every day | SUBCUTANEOUS | Status: DC
  Administered 2019-04-22: 12 [IU] via SUBCUTANEOUS
  Filled 2019-04-22: qty 10

## 2019-04-22 MED ORDER — PANTOPRAZOLE SODIUM 40 MG IV SOLR
40.0000 mg | INTRAVENOUS | Status: DC
  Administered 2019-04-22 – 2019-04-23 (×2): 40 mg via INTRAVENOUS
  Filled 2019-04-22 (×2): qty 40

## 2019-04-22 MED ORDER — ONDANSETRON HCL 4 MG/2ML IJ SOLN
4.0000 mg | Freq: Four times a day (QID) | INTRAMUSCULAR | Status: DC | PRN
  Administered 2019-04-22 (×2): 4 mg via INTRAVENOUS
  Filled 2019-04-22 (×2): qty 2

## 2019-04-22 NOTE — Plan of Care (Signed)
  Problem: Education: Goal: Knowledge of disease or condition will improve Outcome: Progressing   Problem: Education: Goal: Knowledge of disease or condition will improve Outcome: Progressing   

## 2019-04-22 NOTE — Progress Notes (Signed)
Patient ID: Alexis Ellis, female   DOB: 1991/03/09, 28 y.o.   MRN: [id number] [place]) NOTE  Alexis Ellis is a 28 y.o. [id number] at [redacted]w[redacted]d  who is admitted for preeclampsia.    Fetal presentation is unsure. Length of Stay:  3  Days  Date of admission:04/19/2019  Subjective: Patient seen walking out of the bathroom stating that she just threw up. Patient reports feeling well otherwise. She denies HA, visual changes, RUQ/epigastric pain. Patient states that she has experienced emesis throughout her pregnancy Patient reports the fetal movement as active. Patient reports uterine contraction  activity as none. Patient reports  vaginal bleeding as none. Patient describes fluid per vagina as None.  Vitals:  Blood pressure 139/68, pulse 71, temperature 97.8 F (36.6 C), temperature source Oral, resp. rate 20, height 5\' 5"  (1.651 m), weight 119.1 kg, last menstrual period 09/12/2018, SpO2 98 %, unknown if currently breastfeeding. Vitals:   04/22/19 0430 04/22/19 0834 04/22/19 0835 04/22/19 0856  BP: (!) 123/58 (!) 158/101  139/68  Pulse: 64 69  71  Resp: 18 18  20   Temp: 97.6 F (36.4 C) 97.8 F (36.6 C)    TempSrc: Oral Oral    SpO2: 99% 100% 98%   Weight:      Height:       Physical Examination: GENERAL: Well-developed, well-nourished female in no acute distress.  LUNGS: Clear to auscultation bilaterally.  HEART: Regular rate and rhythm. ABDOMEN: Soft, nontender, gravid PELVIC: Not performed EXTREMITIES: No cyanosis, clubbing, or edema, 2+ distal pulses.    Fetal Monitoring:  Baseline: 135 bpm, Variability: Good {> 6 bpm), Accelerations: Reactive and Decelerations: Absent     Labs:  Results for orders placed or performed during the hospital encounter of 04/19/19 (from the past 24 hour(s))  Glucose, capillary   Collection Time: 04/21/19 10:32 AM  Result Value Ref Range   Glucose-Capillary 192 (H) 70 - 99 mg/dL  Glucose, capillary   Collection Time: 04/21/19  3:51 PM  Result Value Ref Range   Glucose-Capillary 117 (H) 70 - 99 mg/dL  Glucose, capillary   Collection Time: 04/21/19  8:39 PM  Result Value Ref Range   Glucose-Capillary 147 (H) 70 - 99 mg/dL  Glucose, capillary   Collection Time: 04/22/19  6:16 AM  Result Value Ref Range   Glucose-Capillary 87 70 - 99 mg/dL    Imaging Studies:    [place] [facility] OB FOLLOW UP  Result Date: 04/20/2019 ----------------------------------------------------------------------  OBSTETRICS REPORT                       (Signed Final 04/20/2019 10:10 am) ---------------------------------------------------------------------- Patient Info  ID #:       [id number]                          D.O.B.:  1991-02-04 (28 yrs)  Name:       Alexis Ellis                Visit Date: 04/20/2019 07:49 am ---------------------------------------------------------------------- Performed By  Performed By:     Novella Rob        Ref. Address:     Faculty                    RDMS  Attending:        Johnell Comings MD         [facility]  Referred By:      Vickii Chafe                    Tomara Youngberg MD ---------------------------------------------------------------------- Orders   #  Description                          Code         Ordered By   1  [place] [facility]FM OB FOLLOW UP                  [id number]     CHARLIE PICKENS  ----------------------------------------------------------------------   #  Order #                    Accession #                 Episode #   1  [id number]                  [id number]                  [id number]  ---------------------------------------------------------------------- Indications   Obesity complicating pregnancy (pregravid      O99.210 E66.9   BMI 40)   Hypertension - Gestational (IV Labetalol)      O13.9   Poor obstetric history-Recurrent (habitual)    O26.20   abortion (4 consecutive ab's)   Encounter for other  antenatal screening        Z36.2   follow-up   Gestational diabetes in pregnancy, diet        O24.410   controlled   [redacted] weeks gestation of pregnancy                Z3A.31  ---------------------------------------------------------------------- Vital Signs  Weight (lb): 262                               Height:        5'5"  BMI:         43.59 ---------------------------------------------------------------------- Fetal Evaluation  Num Of Fetuses:         1  Fetal Heart Rate(bpm):  135  Cardiac Activity:       Observed  Presentation:           Cephalic  Placenta:               Anterior  P. Cord Insertion:      Previously Visualized  Amniotic Fluid  AFI FV:      Within normal limits  AFI Sum(cm)     %Tile       Largest Pocket(cm)  11.68           28          4.95  RUQ(cm)       RLQ(cm)       LUQ(cm)        LLQ(cm)  4.95          0  2.14           4.59 ---------------------------------------------------------------------- Biometry  BPD:      80.7  mm     G. Age:  32w 3d         70  %    CI:        80.19   %    70 - 86                                                          FL/HC:      20.5   %    19.3 - 21.3  HC:      284.7  mm     G. Age:  31w 2d         12  %    HC/AC:      1.06        0.96 - 1.17  AC:      267.8  mm     G. Age:  30w 6d         31  %    FL/BPD:     72.2   %    71 - 87  FL:       58.3  mm     G. Age:  30w 3d         14  %    FL/AC:      21.8   %    20 - 24  HUM:      52.1  mm     G. Age:  30w 3d         30  %  Est. FW:    1663  gm    3 lb 11 oz      23  % ---------------------------------------------------------------------- OB History  Gravidity:    5         Term:   0        Prem:   0        SAB:   3  Ectopic:      1        Living:  0 ---------------------------------------------------------------------- Gestational Age  LMP:           31w 3d        Date:  09/12/18                 EDD:   06/19/19  U/S Today:     31w 2d                                        EDD:   06/20/19  Best:           31w 3d     Det. By:  LMP  (09/12/18)          EDD:   06/19/19 ---------------------------------------------------------------------- Anatomy  Cranium:               Appears normal         LVOT:                   Previously seen  Cavum:  Previously seen        Aortic Arch:            Previously seen  Ventricles:            Previously seen        Ductal Arch:            limited views prev  Choroid Plexus:        Previously seen        Diaphragm:              Appears normal  Cerebellum:            Previously seen        Stomach:                Appears normal, left                                                                        sided  Posterior Fossa:       Previously seen        Abdomen:                Appears normal  Nuchal Fold:           Previously seen        Abdominal Wall:         Previously seen  Face:                  Orbits and profile     Cord Vessels:           Previously seen                         previously seen  Lips:                  Previously seen        Kidneys:                Appear normal  Palate:                Previously seen        Bladder:                Appears normal  Thoracic:              Appears normal         Spine:                  Previously seen  Heart:                 Previously seen        Upper Extremities:      Previously seen  RVOT:                  Previously seen        Lower Extremities:      Previously seen  Other:  Female gender. Nasal bone prev visualized. 5th digit prev visualized.          Technically difficult due to maternal habitus and fetal position. ---------------------------------------------------------------------- Cervix Uterus Adnexa  Cervix  Not visualized (advanced GA >24wks) ---------------------------------------------------------------------- Comments  This patient is currently hospitalized due to newly diagnosed  hypertension and possible severe preeclampsia.  She is  receiving a complete course of antenatal corticosteroids is   currently on magnesium sulfate for maternal seizure  prophylaxis.  The fetal growth and amniotic fluid level appears appropriate  for her gestational age. ----------------------------------------------------------------------                   Johnell Comings, MD Electronically Signed Final Report   04/20/2019 10:10 am ----------------------------------------------------------------------    Medications:  Scheduled . aspirin EC  81 mg Oral Daily  . FLUoxetine  40 mg Oral Daily  . insulin aspart  0-14 Units Subcutaneous TID PC  . insulin NPH Human  12 Units Subcutaneous QAC breakfast  . pantoprazole (PROTONIX) IV  40 mg Intravenous Q24H  . prenatal multivitamin  1 tablet Oral Q1200  . sodium chloride flush  3 mL Intravenous Q12H   I have reviewed the patient's current medications.  ASSESSMENT:  Patient Active Problem List   Diagnosis Date Noted  . Severe pre-eclampsia 04/20/2019  . Gestational hypertension 04/19/2019  . Diet controlled gestational diabetes mellitus (GDM) in third trimester 03/23/2019  . Maternal obesity affecting pregnancy, antepartum 12/28/2018  . Abnormal Pap smear of cervix 12/05/2018  . Supervision of normal pregnancy, antepartum 11/30/2018  . History of ectopic pregnancy 11/30/2018  . History of multiple miscarriages 11/30/2018  . Tension headache 09/29/2015  . HLD (hyperlipidemia) 09/29/2015  . Obesity (BMI 35.0-39.9 without comorbidity) 09/29/2015  . ADD (attention deficit disorder) 11/07/2013  . Depression affecting pregnancy in second trimester, antepartum 11/07/2013  . Elevated blood pressure affecting pregnancy in third trimester, antepartum 11/01/2013    PLAN: - Patient completed BMZ - s/p magnesium sulfate for seizure and CP prophylaxis - BP has been stable since admission. Patient remains asymptomatic. Diagnosis revised to preeclampsia without severe features. Case discussed with MFM Dr. Donalee Citrin - Normal growth ultrasound - NICU consult pending -  Elevated CBG post BMZ will start NPH 12 units in am given a normal fasting this morning per diabetic coordinator - Continue current antepartum care  Adessa Primiano 04/22/2019,9:10 AM

## 2019-04-22 NOTE — Plan of Care (Signed)
  Problem: Education: Goal: Knowledge of disease or condition will improve Outcome: Progressing Goal: Knowledge of the prescribed therapeutic regimen will improve Outcome: Progressing   Problem: Clinical Measurements: Goal: Complications related to disease process, condition or treatment will be avoided or minimized Outcome: Progressing   Problem: Education: Goal: Knowledge of the prescribed therapeutic regimen will improve Outcome: Progressing   Problem: Clinical Measurements: Goal: Complications related to the disease process, condition or treatment will be avoided or minimized Outcome: Progressing

## 2019-04-23 ENCOUNTER — Inpatient Hospital Stay (HOSPITAL_COMMUNITY): Payer: Medicaid Other

## 2019-04-23 DIAGNOSIS — Z3A31 31 weeks gestation of pregnancy: Secondary | ICD-10-CM

## 2019-04-23 DIAGNOSIS — O133 Gestational [pregnancy-induced] hypertension without significant proteinuria, third trimester: Secondary | ICD-10-CM

## 2019-04-23 DIAGNOSIS — O2623 Pregnancy care for patient with recurrent pregnancy loss, third trimester: Secondary | ICD-10-CM

## 2019-04-23 DIAGNOSIS — O24410 Gestational diabetes mellitus in pregnancy, diet controlled: Secondary | ICD-10-CM

## 2019-04-23 DIAGNOSIS — O99213 Obesity complicating pregnancy, third trimester: Secondary | ICD-10-CM

## 2019-04-23 LAB — COMPREHENSIVE METABOLIC PANEL
ALT: 15 U/L (ref 0–44)
AST: 22 U/L (ref 15–41)
Albumin: 2.2 g/dL — ABNORMAL LOW (ref 3.5–5.0)
Alkaline Phosphatase: 61 U/L (ref 38–126)
Anion gap: 9 (ref 5–15)
BUN: 11 mg/dL (ref 6–20)
CO2: 23 mmol/L (ref 22–32)
Calcium: 8.4 mg/dL — ABNORMAL LOW (ref 8.9–10.3)
Chloride: 103 mmol/L (ref 98–111)
Creatinine, Ser: 0.63 mg/dL (ref 0.44–1.00)
GFR calc Af Amer: >60 mL/min (ref >60)
GFR calc non Af Amer: >60 mL/min (ref >60)
Glucose, Bld: 81 mg/dL (ref 70–99)
Potassium: 4.4 mmol/L (ref 3.5–5.1)
Sodium: 135 mmol/L (ref 135–145)
Total Bilirubin: 0.6 mg/dL (ref 0.3–1.2)
Total Protein: 4.9 g/dL — ABNORMAL LOW (ref 6.5–8.1)

## 2019-04-23 LAB — CBC
HCT: 35.3 % — ABNORMAL LOW (ref 36.0–46.0)
Hemoglobin: 11.9 g/dL — ABNORMAL LOW (ref 12.0–15.0)
MCH: 28.5 pg (ref 26.0–34.0)
MCHC: 33.7 g/dL (ref 30.0–36.0)
MCV: 84.7 fL (ref 80.0–100.0)
Platelets: 218 10*3/uL (ref 150–400)
RBC: 4.17 MIL/uL (ref 3.87–5.11)
RDW: 13.2 % (ref 11.5–15.5)
WBC: 13.1 10*3/uL — ABNORMAL HIGH (ref 4.0–10.5)
nRBC: 0 % (ref 0.0–0.2)

## 2019-04-23 LAB — GLUCOSE, CAPILLARY
Glucose-Capillary: 100 mg/dL — ABNORMAL HIGH (ref 70–99)
Glucose-Capillary: 76 mg/dL (ref 70–99)

## 2019-04-23 MED ORDER — PANTOPRAZOLE SODIUM 40 MG PO TBEC: 40.0000 mg | DELAYED_RELEASE_TABLET | Freq: Every day | ORAL | 1 refills | Status: DC

## 2019-04-23 MED ORDER — PANTOPRAZOLE SODIUM 40 MG PO TBEC: 40.0000 mg | DELAYED_RELEASE_TABLET | Freq: Every day | ORAL | Status: DC

## 2019-04-23 MED ORDER — CYCLOBENZAPRINE HCL 10 MG PO TABS: 10.0000 mg | ORAL_TABLET | Freq: Three times a day (TID) | ORAL | 0 refills | Status: DC | PRN

## 2019-04-23 MED ORDER — CYCLOBENZAPRINE HCL 10 MG PO TABS: 10.0000 mg | ORAL_TABLET | Freq: Three times a day (TID) | ORAL | Status: DC | PRN

## 2019-04-23 NOTE — Discharge Summary (Signed)
OB Discharge Summary     Patient Name: Alexis Ellis DOB: 09/15/1990 MRN: [id number]  Date of admission: 04/19/2019  Date of discharge: 04/23/2019  Admitting diagnosis: HBP  Intrauterine pregnancy: [redacted]w[redacted]d    Secondary diagnosis:  Active Problems:   Severe pre-eclampsia      Discharge diagnosis: preterm pregnancy with preeclampsia without severe features                                                                                               Hospital course:  Patient admitted with severe range BP, completed BMZ and received magnesium sulfate for CP and seizure prophylaxis. Throughout the remainder of her hospitalization, she remained normotensive and without symptoms. Repeat labs were normal and BPP on day of discharge was 8/8. CBGs normalized without insulin. Patient was found stable for discharge with close outpatient follow up. Discharge instructions were reviewed  Physical exam  Vitals:   04/22/19 2339 04/23/19 0356 04/23/19 0808 04/23/19 1202  BP: 129/62 139/69 (!) 157/88 (!) 142/70  Pulse: 74 61 74 69  Resp: 18 18 18 20   Temp: 98.4 F (36.9 C) 98.7 F (37.1 C) 98.7 F (37.1 C) 97.8 F (36.6 C)  TempSrc:  Oral Oral Oral  SpO2: 100% 100% 97% 98%  Weight:      Height:       GENERAL: Well-developed, well-nourished female in no acute distress.  HEENT: Normocephalic, atraumatic. Sclerae anicteric.  LUNGS: Clear to auscultation bilaterally.  HEART: Regular rate and rhythm. ABDOMEN: Soft, nontender, gravid PELVIC: Not indicated EXTREMITIES: No cyanosis, clubbing, or edema, 2+ distal pulses.  Labs: Lab Results  Component Value Date   WBC 13.1 (H) 04/23/2019   HGB 11.9 (L) 04/23/2019   HCT 35.3 (L) 04/23/2019   MCV 84.7 04/23/2019   PLT 218 04/23/2019   CMP Latest Ref Rng & Units 04/23/2019  Glucose 70 - 99 mg/dL 81  BUN 6 - 20 mg/dL 11  Creatinine 0.44 - 1.00 mg/dL 0.63  Sodium 135 - 145 mmol/L 135  Potassium 3.5 - 5.1 mmol/L 4.4  Chloride 98 - 111  mmol/L 103  CO2 22 - 32 mmol/L 23  Calcium 8.9 - 10.3 mg/dL 8.4(L)  Total Protein 6.5 - 8.1 g/dL 4.9(L)  Total Bilirubin 0.3 - 1.2 mg/dL 0.6  Alkaline Phos 38 - 126 U/L 61  AST 15 - 41 U/L 22  ALT 0 - 44 U/L 15    Discharge instruction: per After Visit Summary and "Baby and Me Booklet".  After visit meds:  Allergies as of 04/23/2019   No Known Allergies     Medication List    TAKE these medications   Accu-Chek FastClix Lancets Misc 1 each by Percutaneous route 4 (four) times daily.   Accu-Chek Guide test strip Generic drug: glucose blood Use 1 strip to test blood glucose 4 times daily.   acetaminophen 325 MG tablet Commonly known as: TYLENOL Take 650 mg by mouth every 6 (six) hours as needed.   aspirin EC 81 MG tablet Take 1 tablet (81 mg total) by mouth daily. Take after 12 weeks for prevention of  preeclampsia later in pregnancy   Blood Pressure Monitor Kit 1 Device by Does not apply route once a week. To be monitored Regularly at home.   butalbital-acetaminophen-caffeine 50-325-40 MG tablet Commonly known as: FIORICET Take 1-2 tablets by mouth every 6 (six) hours as needed for headache.   cyclobenzaprine 10 MG tablet Commonly known as: FLEXERIL Take 1 tablet (10 mg total) by mouth 3 (three) times daily as needed for muscle spasms.   FLUoxetine 40 MG capsule Commonly known as: PROZAC Take 40 mg by mouth daily.   Misc. Devices Misc Dispense one maternity belt for patient   pantoprazole 40 MG tablet Commonly known as: PROTONIX Take 1 tablet (40 mg total) by mouth daily. Start taking on: April 24, 2019   Prenatal Gummies/DHA & FA 0.4-32.5 MG Chew Chew by mouth.       Diet: carb modified diet  Activity: Advance as tolerated. Pelvic rest for 6 weeks.   Outpatient follow up:1 week Follow up Appt: Future Appointments  Date Time Provider Comunas  04/27/2019 11:15 AM WH-MFC [place] 4 WH-MFCUS MFC-US  04/29/2019  9:00 AM Anyanwu, Sallyanne Havers, MD  [facility] None  05/04/2019 10:15 AM Guenther Dunshee, Vickii Chafe, MD [facility] None     04/23/2019 Mora Bellman, MD

## 2019-04-23 NOTE — Progress Notes (Signed)
Pt discharged to home with husband.  Condition stable.  Pt ambulated to car with L. Graylon Good, NT.  No equipment for home ordered at discharge.

## 2019-04-23 NOTE — Progress Notes (Addendum)
Patient ID: [name], female   DOB: 20-Nov-1990, 28 y.o.   MRN: [id number] [place]) NOTE  MARIADELOSANG PRESTO is a 28 y.o. [id number] at [redacted]w[redacted]d  who is admitted for preeclampsia.    Fetal presentation is unsure. Length of Stay:  4  Days  Date of admission:04/19/2019  Subjective: Reports back pain today, reports the bed is uncomfortable. She denies HA, visual changes, RUQ/epigastric pain. Patient reports the fetal movement as active. Patient reports uterine contraction  activity as none. Patient reports  vaginal bleeding as none. Patient describes fluid per vagina as none.  Vitals:  Blood pressure 139/69, pulse 61, temperature 98.7 F (37.1 C), temperature source Oral, resp. rate 18, height 5\' 5"  (1.651 m), weight 119.1 kg, last menstrual period 09/12/2018, SpO2 100 %, unknown if currently breastfeeding. Vitals:   04/22/19 1610 04/22/19 2008 04/22/19 2339 04/23/19 0356  BP:  134/65 129/62 139/69  Pulse:  66 74 61  Resp:  18 18 18   Temp:  97.7 F (36.5 C) 98.4 F (36.9 C) 98.7 F (37.1 C)  TempSrc:  Oral  Oral  SpO2: 98% 99% 100% 100%  Weight:      Height:       Physical Examination: GENERAL: Well-developed, well-nourished female in no acute distress.  LUNGS: Clear to auscultation bilaterally.  HEART: Regular rate and rhythm. ABDOMEN: Soft, nontender, gravid PELVIC: Not performed EXTREMITIES: No cyanosis, clubbing, or edema, 2+ distal pulses.    Fetal Monitoring:  Baseline: 135 bpm, Variability: Good {> 6 bpm), Accelerations: Reactive and Decelerations: Absent     Labs:  Results for orders placed or performed during the hospital encounter of 04/19/19 (from the past 24 hour(s))  Glucose, capillary   Collection Time: 04/22/19  1:07 PM  Result Value Ref Range   Glucose-Capillary 72 70 - 99 mg/dL   Comment 1 Notify RN    Comment 2 Document in Chart   Glucose, capillary   Collection Time: 04/22/19  5:00 PM  Result Value Ref Range   Glucose-Capillary 75 70 - 99 mg/dL   Comment 1 Notify RN    Comment 2 Document in Chart   Glucose, capillary   Collection Time: 04/22/19  8:46 PM  Result Value Ref Range   Glucose-Capillary 80 70 - 99 mg/dL  Comprehensive metabolic panel   Collection Time: 04/23/19  5:02 AM  Result Value Ref Range   Sodium 135 135 - 145 mmol/L   Potassium 4.4 3.5 - 5.1 mmol/L   Chloride 103 98 - 111 mmol/L   CO2 23 22 - 32 mmol/L   Glucose, Bld 81 70 - 99 mg/dL   BUN 11 6 - 20 mg/dL   Creatinine, Ser 0.63 0.44 - 1.00 mg/dL   Calcium 8.4 (L) 8.9 - 10.3 mg/dL   Total Protein 4.9 (L) 6.5 - 8.1 g/dL   Albumin 2.2 (L) 3.5 - 5.0 g/dL   AST 22 15 - 41 U/L   ALT 15 0 - 44 U/L   Alkaline Phosphatase 61 38 - 126 U/L   Total Bilirubin 0.6 0.3 - 1.2 mg/dL   GFR calc non Af Amer >60 >60 mL/min   GFR calc Af Amer >60 >60 mL/min   Anion gap 9 5 - 15  CBC   Collection Time: 04/23/19  5:02 AM  Result Value Ref Range   WBC 13.1 (H) 4.0 - 10.5 K/uL   RBC 4.17 3.87 - 5.11 MIL/uL   Hemoglobin 11.9 (L) 12.0 - 15.0 g/dL   HCT  35.3 (L) 36.0 - 46.0 %   MCV 84.7 80.0 - 100.0 fL   MCH 28.5 26.0 - 34.0 pg   MCHC 33.7 30.0 - 36.0 g/dL   RDW 13.2 11.5 - 15.5 %   Platelets 218 150 - 400 K/uL   nRBC 0.0 0.0 - 0.2 %  Glucose, capillary   Collection Time: 04/23/19  6:23 AM  Result Value Ref Range   Glucose-Capillary 100 (H) 70 - 99 mg/dL    Imaging Studies:    [place] MFM OB FOLLOW UP  Result Date: 04/20/2019 ----------------------------------------------------------------------  OBSTETRICS REPORT                       (Signed Final 04/20/2019 10:10 am) ---------------------------------------------------------------------- Patient Info  ID #:       [id number]                          D.O.B.:  [birth year] (28 yrs)  Name:       [name]                Visit Date: 04/20/2019 07:49 am ---------------------------------------------------------------------- Performed By  Performed By:     Novella Rob        Ref. Address:      Faculty                    RDMS  Attending:        Johnell Comings MD         [facility]  Referred By:      Mora Bellman MD ---------------------------------------------------------------------- Orders   #  Description                          Code         Ordered By   1  [place] MFM OB FOLLOW UP                  [id number]     CHARLIE PICKENS  ----------------------------------------------------------------------   #  Order #                    Accession #                 Episode #   1  [id number]                  [id number]                  [id number]  ---------------------------------------------------------------------- Indications   Obesity complicating pregnancy (pregravid      O99.210 E66.9   BMI 40)   Hypertension - Gestational (IV Labetalol)      O13.9   Poor obstetric history-Recurrent (habitual)    O26.20   abortion (4 consecutive ab's)   Encounter for other antenatal screening        Z36.2  follow-up   Gestational diabetes in pregnancy, diet        O24.410   controlled   [redacted] weeks gestation of pregnancy                Z3A.31  ---------------------------------------------------------------------- Vital Signs  Weight (lb): 262                               Height:        5'5"  BMI:         43.59 ---------------------------------------------------------------------- Fetal Evaluation  Num Of Fetuses:         1  Fetal Heart Rate(bpm):  135  Cardiac Activity:       Observed  Presentation:           Cephalic  Placenta:               Anterior  P. Cord Insertion:      Previously Visualized  Amniotic Fluid  AFI FV:      Within normal limits  AFI Sum(cm)     %Tile       Largest Pocket(cm)  11.68           28          4.95  RUQ(cm)       RLQ(cm)       LUQ(cm)        LLQ(cm)  4.95          0             2.14           4.59 ---------------------------------------------------------------------- Biometry  BPD:       80.7  mm     G. Age:  32w 3d         70  %    CI:        80.19   %    70 - 86                                                          FL/HC:      20.5   %    19.3 - 21.3  HC:      284.7  mm     G. Age:  31w 2d         12  %    HC/AC:      1.06        0.96 - 1.17  AC:      267.8  mm     G. Age:  30w 6d         31  %    FL/BPD:     72.2   %    71 - 87  FL:       58.3  mm     G. Age:  30w 3d         14  %    FL/AC:      21.8   %    20 - 24  HUM:      52.1  mm     G. Age:  30w 3d         30  %  Est. FW:    1663  gm  3 lb 11 oz      23  % ---------------------------------------------------------------------- OB History  Gravidity:    5         Term:   0        Prem:   0        SAB:   3  Ectopic:      1        Living:  0 ---------------------------------------------------------------------- Gestational Age  LMP:           31w 3d        Date:  09/12/18                 EDD:   06/19/19  U/S Today:     31w 2d                                        EDD:   06/20/19  Best:          31w 3d     Det. By:  LMP  (09/12/18)          EDD:   06/19/19 ---------------------------------------------------------------------- Anatomy  Cranium:               Appears normal         LVOT:                   Previously seen  Cavum:                 Previously seen        Aortic Arch:            Previously seen  Ventricles:            Previously seen        Ductal Arch:            limited views prev  Choroid Plexus:        Previously seen        Diaphragm:              Appears normal  Cerebellum:            Previously seen        Stomach:                Appears normal, left                                                                        sided  Posterior Fossa:       Previously seen        Abdomen:                Appears normal  Nuchal Fold:           Previously seen        Abdominal Wall:         Previously seen  Face:                  Orbits and profile     Cord Vessels:           Previously seen  previously seen   Lips:                  Previously seen        Kidneys:                Appear normal  Palate:                Previously seen        Bladder:                Appears normal  Thoracic:              Appears normal         Spine:                  Previously seen  Heart:                 Previously seen        Upper Extremities:      Previously seen  RVOT:                  Previously seen        Lower Extremities:      Previously seen  Other:  Female gender. Nasal bone prev visualized. 5th digit prev visualized.          Technically difficult due to maternal habitus and fetal position. ---------------------------------------------------------------------- Cervix Uterus Adnexa  Cervix  Not visualized (advanced GA >24wks) ---------------------------------------------------------------------- Comments  This patient is currently hospitalized due to newly diagnosed  hypertension and possible severe preeclampsia.  She is  receiving a complete course of antenatal corticosteroids is  currently on magnesium sulfate for maternal seizure  prophylaxis.  The fetal growth and amniotic fluid level appears appropriate  for her gestational age. ----------------------------------------------------------------------                   Johnell Comings, MD Electronically Signed Final Report   04/20/2019 10:10 am ----------------------------------------------------------------------    Medications:  Scheduled . aspirin EC  81 mg Oral Daily  . FLUoxetine  40 mg Oral Daily  . insulin aspart  0-14 Units Subcutaneous TID PC  . pantoprazole (PROTONIX) IV  40 mg Intravenous Q24H  . prenatal multivitamin  1 tablet Oral Q1200  . sodium chloride flush  3 mL Intravenous Q12H   I have reviewed the patient's current medications.  ASSESSMENT:  Patient Active Problem List   Diagnosis Date Noted  . Severe pre-eclampsia 04/20/2019  . Gestational hypertension 04/19/2019  . Diet controlled gestational diabetes mellitus (GDM) in third trimester  03/23/2019  . Maternal obesity affecting pregnancy, antepartum 12/28/2018  . Abnormal Pap smear of cervix 12/05/2018  . Supervision of normal pregnancy, antepartum 11/30/2018  . History of ectopic pregnancy 11/30/2018  . History of multiple miscarriages 11/30/2018  . Tension headache 09/29/2015  . HLD (hyperlipidemia) 09/29/2015  . Obesity (BMI 35.0-39.9 without comorbidity) 09/29/2015  . ADD (attention deficit disorder) 11/07/2013  . Depression affecting pregnancy in second trimester, antepartum 11/07/2013  . Elevated blood pressure affecting pregnancy in third trimester, antepartum 11/01/2013    PLAN: - Flexeril prescribed for back pain - Patient completed BMZ - s/p magnesium sulfate for seizure and CP prophylaxis - BP has been stable since admission. Patient remains asymptomatic. Diagnosis revised to preeclampsia without severe features. Case discussed with MFM Dr. Donalee Citrin - Normal growth ultrasound - Normal labs today. - Elevated fasting CBG post BMZ will follow diabetic coordinator recommendations - BPP today, will  follow up results and manage accordingly. - Continue current antepartum care, will discuss disposition later.  Verita Schneiders, MD 04/23/2019,7:42 AM

## 2019-04-23 NOTE — Discharge Instructions (Signed)

## 2019-04-26 ENCOUNTER — Other Ambulatory Visit (HOSPITAL_COMMUNITY): Payer: Self-pay | Admitting: *Deleted

## 2019-04-26 DIAGNOSIS — O133 Gestational [pregnancy-induced] hypertension without significant proteinuria, third trimester: Secondary | ICD-10-CM

## 2019-04-27 ENCOUNTER — Ambulatory Visit (HOSPITAL_COMMUNITY): Payer: Medicaid Other | Admitting: *Deleted

## 2019-04-27 ENCOUNTER — Other Ambulatory Visit: Payer: Self-pay

## 2019-04-27 ENCOUNTER — Ambulatory Visit (HOSPITAL_COMMUNITY)
Admission: RE | Admit: 2019-04-27 | Discharge: 2019-04-27 | Disposition: A | Discharge status: Home / Self Care | Payer: Medicaid Other | Source: Ambulatory Visit | Attending: Obstetrics and Gynecology | Admitting: Obstetrics and Gynecology

## 2019-04-27 ENCOUNTER — Other Ambulatory Visit (HOSPITAL_COMMUNITY): Payer: Self-pay | Admitting: *Deleted

## 2019-04-27 ENCOUNTER — Encounter (HOSPITAL_COMMUNITY): Payer: Self-pay

## 2019-04-27 VITALS — BP 145/85 | HR 102 | Temp 97.5°F

## 2019-04-27 DIAGNOSIS — O139 Gestational [pregnancy-induced] hypertension without significant proteinuria, unspecified trimester: Secondary | ICD-10-CM

## 2019-04-27 DIAGNOSIS — E669 Obesity, unspecified: Secondary | ICD-10-CM | POA: Insufficient documentation

## 2019-04-27 DIAGNOSIS — O99210 Obesity complicating pregnancy, unspecified trimester: Secondary | ICD-10-CM

## 2019-04-27 DIAGNOSIS — O133 Gestational [pregnancy-induced] hypertension without significant proteinuria, third trimester: Secondary | ICD-10-CM

## 2019-04-27 DIAGNOSIS — Z3A32 32 weeks gestation of pregnancy: Secondary | ICD-10-CM | POA: Diagnosis not present

## 2019-04-27 DIAGNOSIS — O99213 Obesity complicating pregnancy, third trimester: Secondary | ICD-10-CM | POA: Insufficient documentation

## 2019-04-27 DIAGNOSIS — O2623 Pregnancy care for patient with recurrent pregnancy loss, third trimester: Secondary | ICD-10-CM | POA: Diagnosis not present

## 2019-04-27 DIAGNOSIS — O24410 Gestational diabetes mellitus in pregnancy, diet controlled: Secondary | ICD-10-CM | POA: Insufficient documentation

## 2019-04-28 ENCOUNTER — Other Ambulatory Visit (HOSPITAL_COMMUNITY): Payer: Medicaid Other

## 2019-04-29 ENCOUNTER — Encounter (HOSPITAL_COMMUNITY): Payer: Self-pay | Admitting: Obstetrics & Gynecology

## 2019-04-29 ENCOUNTER — Other Ambulatory Visit: Payer: Self-pay

## 2019-04-29 ENCOUNTER — Ambulatory Visit (INDEPENDENT_AMBULATORY_CARE_PROVIDER_SITE_OTHER): Payer: Medicaid Other | Admitting: Obstetrics & Gynecology

## 2019-04-29 ENCOUNTER — Inpatient Hospital Stay (HOSPITAL_COMMUNITY)
Admission: AC | Admit: 2019-04-29 | Discharge: 2019-05-03 | DRG: 807 | Disposition: A | Discharge status: Home / Self Care | Payer: Medicaid Other | Attending: Obstetrics and Gynecology | Admitting: Obstetrics and Gynecology

## 2019-04-29 VITALS — BP 164/116 | HR 112 | Wt 265.1 lb

## 2019-04-29 DIAGNOSIS — F329 Major depressive disorder, single episode, unspecified: Secondary | ICD-10-CM | POA: Diagnosis present

## 2019-04-29 DIAGNOSIS — O99344 Other mental disorders complicating childbirth: Secondary | ICD-10-CM | POA: Diagnosis present

## 2019-04-29 DIAGNOSIS — E669 Obesity, unspecified: Secondary | ICD-10-CM | POA: Diagnosis present

## 2019-04-29 DIAGNOSIS — Z3A32 32 weeks gestation of pregnancy: Secondary | ICD-10-CM

## 2019-04-29 DIAGNOSIS — O1413 Severe pre-eclampsia, third trimester: Secondary | ICD-10-CM

## 2019-04-29 DIAGNOSIS — O24420 Gestational diabetes mellitus in childbirth, diet controlled: Secondary | ICD-10-CM | POA: Diagnosis present

## 2019-04-29 DIAGNOSIS — O99214 Obesity complicating childbirth: Secondary | ICD-10-CM | POA: Diagnosis present

## 2019-04-29 DIAGNOSIS — O1414 Severe pre-eclampsia complicating childbirth: Principal | ICD-10-CM | POA: Diagnosis present

## 2019-04-29 DIAGNOSIS — O99210 Obesity complicating pregnancy, unspecified trimester: Secondary | ICD-10-CM

## 2019-04-29 DIAGNOSIS — Z880 Allergy status to penicillin: Secondary | ICD-10-CM

## 2019-04-29 DIAGNOSIS — O24410 Gestational diabetes mellitus in pregnancy, diet controlled: Secondary | ICD-10-CM

## 2019-04-29 DIAGNOSIS — O0993 Supervision of high risk pregnancy, unspecified, third trimester: Secondary | ICD-10-CM

## 2019-04-29 DIAGNOSIS — Z3A33 33 weeks gestation of pregnancy: Secondary | ICD-10-CM | POA: Diagnosis not present

## 2019-04-29 DIAGNOSIS — Z3490 Encounter for supervision of normal pregnancy, unspecified, unspecified trimester: Secondary | ICD-10-CM | POA: Diagnosis present

## 2019-04-29 DIAGNOSIS — R030 Elevated blood-pressure reading, without diagnosis of hypertension: Secondary | ICD-10-CM | POA: Diagnosis present

## 2019-04-29 HISTORY — DX: Gestational (pregnancy-induced) hypertension without significant proteinuria, unspecified trimester: O13.9

## 2019-04-29 LAB — COMPREHENSIVE METABOLIC PANEL
ALT: 18 U/L (ref 0–44)
AST: 17 U/L (ref 15–41)
Albumin: 2.6 g/dL — ABNORMAL LOW (ref 3.5–5.0)
Alkaline Phosphatase: 76 U/L (ref 38–126)
Anion gap: 10 (ref 5–15)
BUN: 15 mg/dL (ref 6–20)
CO2: 21 mmol/L — ABNORMAL LOW (ref 22–32)
Calcium: 9.2 mg/dL (ref 8.9–10.3)
Chloride: 107 mmol/L (ref 98–111)
Creatinine, Ser: 0.72 mg/dL (ref 0.44–1.00)
GFR calc Af Amer: >60 mL/min (ref >60)
GFR calc non Af Amer: >60 mL/min (ref >60)
Glucose, Bld: 93 mg/dL (ref 70–99)
Potassium: 4.3 mmol/L (ref 3.5–5.1)
Sodium: 138 mmol/L (ref 135–145)
Total Bilirubin: 0.2 mg/dL — ABNORMAL LOW (ref 0.3–1.2)
Total Protein: 6 g/dL — ABNORMAL LOW (ref 6.5–8.1)

## 2019-04-29 LAB — CBC
HCT: 39.4 % (ref 36.0–46.0)
Hemoglobin: 13.6 g/dL (ref 12.0–15.0)
MCH: 28.6 pg (ref 26.0–34.0)
MCHC: 34.5 g/dL (ref 30.0–36.0)
MCV: 82.9 fL (ref 80.0–100.0)
Platelets: 284 10*3/uL (ref 150–400)
RBC: 4.75 MIL/uL (ref 3.87–5.11)
RDW: 13.2 % (ref 11.5–15.5)
WBC: 13.3 10*3/uL — ABNORMAL HIGH (ref 4.0–10.5)
nRBC: 0 % (ref 0.0–0.2)

## 2019-04-29 LAB — TYPE AND SCREEN
ABO/RH(D): A POS
Antibody Screen: NEGATIVE

## 2019-04-29 LAB — URINALYSIS, ROUTINE W REFLEX MICROSCOPIC
Bilirubin Urine: NEGATIVE
Glucose, UA: NEGATIVE mg/dL
Hgb urine dipstick: NEGATIVE
Ketones, ur: NEGATIVE mg/dL
Leukocytes,Ua: NEGATIVE
Nitrite: NEGATIVE
Protein, ur: 100 mg/dL — AB
Specific Gravity, Urine: 1.025 (ref 1.005–1.030)
pH: 6 (ref 5.0–8.0)

## 2019-04-29 LAB — PROTEIN / CREATININE RATIO, URINE
Creatinine, Urine: 127.26 mg/dL
Protein Creatinine Ratio: 2.41 mg/mg{Cre} — ABNORMAL HIGH (ref 0.00–0.15)
Total Protein, Urine: 307 mg/dL

## 2019-04-29 LAB — GLUCOSE, CAPILLARY: Glucose-Capillary: 118 mg/dL — ABNORMAL HIGH (ref 70–99)

## 2019-04-29 MED ORDER — OXYCODONE-ACETAMINOPHEN 5-325 MG PO TABS
1.0000 | ORAL_TABLET | Freq: Once | ORAL | Status: AC
  Administered 2019-04-29: 2 via ORAL
  Filled 2019-04-29: qty 2

## 2019-04-29 MED ORDER — LABETALOL HCL 5 MG/ML IV SOLN
20.0000 mg | INTRAVENOUS | Status: DC | PRN
  Administered 2019-04-29 – 2019-05-01 (×2): 20 mg via INTRAVENOUS
  Filled 2019-04-29 (×3): qty 4

## 2019-04-29 MED ORDER — CALCIUM CARBONATE ANTACID 500 MG PO CHEW: 2.0000 | CHEWABLE_TABLET | ORAL | Status: DC | PRN

## 2019-04-29 MED ORDER — HYDRALAZINE HCL 20 MG/ML IJ SOLN: 10.0000 mg | INTRAMUSCULAR | Status: DC | PRN

## 2019-04-29 MED ORDER — LACTATED RINGERS IV SOLN: INTRAVENOUS | Status: DC

## 2019-04-29 MED ORDER — PRENATAL MULTIVITAMIN CH: 1.0000 | ORAL_TABLET | Freq: Every day | ORAL | Status: DC

## 2019-04-29 MED ORDER — MAGNESIUM SULFATE 40 GM/1000ML IV SOLN
2.0000 g/h | INTRAVENOUS | Status: AC
  Administered 2019-04-29 – 2019-04-30 (×2): 2 g/h via INTRAVENOUS
  Filled 2019-04-29 (×2): qty 1000

## 2019-04-29 MED ORDER — DOCUSATE SODIUM 100 MG PO CAPS: 100.0000 mg | ORAL_CAPSULE | Freq: Every day | ORAL | Status: DC

## 2019-04-29 MED ORDER — ZOLPIDEM TARTRATE 5 MG PO TABS: 5.0000 mg | ORAL_TABLET | Freq: Every evening | ORAL | Status: DC | PRN

## 2019-04-29 MED ORDER — LABETALOL HCL 5 MG/ML IV SOLN
80.0000 mg | INTRAVENOUS | Status: DC | PRN
  Administered 2019-05-01: 80 mg via INTRAVENOUS
  Filled 2019-04-29: qty 16

## 2019-04-29 MED ORDER — LABETALOL HCL 5 MG/ML IV SOLN
40.0000 mg | INTRAVENOUS | Status: DC | PRN
  Administered 2019-04-29 – 2019-05-01 (×2): 40 mg via INTRAVENOUS
  Filled 2019-04-29 (×2): qty 8

## 2019-04-29 MED ORDER — MAGNESIUM SULFATE BOLUS VIA INFUSION
4.0000 g | Freq: Once | INTRAVENOUS | Status: AC
  Administered 2019-04-29: 4 g via INTRAVENOUS
  Filled 2019-04-29: qty 1000

## 2019-04-29 MED ORDER — ACETAMINOPHEN 325 MG PO TABS
650.0000 mg | ORAL_TABLET | ORAL | Status: DC | PRN
  Administered 2019-04-29 – 2019-04-30 (×2): 650 mg via ORAL
  Filled 2019-04-29 (×3): qty 2

## 2019-04-29 NOTE — H&P (Addendum)
Chief Complaint  Patient presents with  . BP Eval     First Provider Initiated Contact with Patient 04/29/19 1106      S: Alexis Ellis  is a 28 y.o. y.o. year old G70P0040 female at [redacted]w[redacted]d weeks gestation by LMP/5.5 week [place] who was sent to [facility]U with severely elevated blood pressures at [facility]. Hx Pr-E. Was admitted 04/19/19-04/23/19 Current blood pressure medication: None  Associated symptoms: Negative for headache, vision changes, epigastric pain Contractions: Denies Vaginal bleeding: Denies Fetal movement: Active   Nursing Staff Provider  Office Location  [facility]  Dating  LMP c/w 6week [place]  Language  English  Anatomy US  suboptimal- f/u ordered  Flu Vaccine  Declined 03/22/2019 Genetic Screen  NIPS: low risk female  AFP: neg  TDaP vaccine   03/22/2019 Hgb A1C or  GTT Early  Third trimester   Rhogam     LAB RESULTS   Feeding Plan BREAST  Blood Type A/Positive/-- (07/20 0955)   Contraception None per pt  Antibody Negative (07/20 0955)  Circumcision Yes  Rubella 15.90 (07/20 0955)  Pediatrician  [facility] Dr. Trilby Drummer RPR Non Reactive (07/20 0955)   Support Person FOB and Family  HBsAg Negative (07/20 0955)   Prenatal Classes  HIV Non Reactive (07/20 0955)  BTL Consent  GBS  (For PCN allergy, check sensitivities)   VBAC Consent  Pap LGSIL 11/2018    Hgb Electro  AA  BP Cuff yes CF Neg    SMA  3 copies    Waterbirth  [ ]  Class [ ]  Consent [ ]  CNM visit    O:  Patient Vitals for the past 24 hrs:  04/29/19 1400 (!) 148/87 -- -- 86 -- -- -- --  04/29/19 1346 (!) 170/99 -- -- 97 -- -- -- --  04/29/19 1330 (!) 150/98 -- -- 94 -- -- -- --  04/29/19 1316 127/71 -- -- 88 -- -- -- --  04/29/19 1301 128/67 -- -- 90 -- -- -- --  04/29/19 1246 (!) 125/54 -- -- 90 -- -- -- --  04/29/19 1230 (!) 144/79 -- -- 83 -- -- -- --  04/29/19 1216 134/79 -- -- 88 -- -- -- --  04/29/19 1200 139/80 -- -- 89 -- -- -- --  04/29/19 1145 (!) 156/99 -- -- 86 -- -- -- --  04/29/19 1130 (!) 155/96 --  -- 87 -- -- -- --  04/29/19 1115 (!) 156/90 -- -- 93 -- -- -- --  04/29/19 1100 (!) 171/106 -- -- (!) 111 -- -- -- --  04/29/19 1046 (!) 172/114 -- -- (!) 111 -- -- -- --  04/29/19 1038 (!) 175/107 -- -- (!) 107 -- -- -- --  04/29/19 1028 (!) 162/105 98.1 F (36.7 C) Oral (!) 108 18 98 % -- --  04/29/19 1017 -- -- -- -- -- -- 5\' 5"  (1.651 m) 120.2 kg   General: NAD Heart: Regular rate Lungs: Normal rate and effort Abd: Soft, NT, Gravid, S=D Extremities: 2+Pedal edema Neuro: 2+ deep tendon reflexes, No clonus Pelvic: NEFG, no bleeding or LOF.      EFM: 140, Moderate variability, 15 x 15 accelerations, no decelerations Toco: UI  Results for orders placed or performed during the hospital encounter of 04/29/19 (from the past 24 hour(s))  Urinalysis, Routine w reflex microscopic     Status: Abnormal   Collection Time: 04/29/19 10:50 AM  Result Value Ref Range   Color, Urine YELLOW YELLOW   APPearance  HAZY (A) CLEAR   Specific Gravity, Urine 1.025 1.005 - 1.030   pH 6.0 5.0 - 8.0   Glucose, UA NEGATIVE NEGATIVE mg/dL   Hgb urine dipstick NEGATIVE NEGATIVE   Bilirubin Urine NEGATIVE NEGATIVE   Ketones, ur NEGATIVE NEGATIVE mg/dL   Protein, ur 100 (A) NEGATIVE mg/dL   Nitrite NEGATIVE NEGATIVE   Leukocytes,Ua NEGATIVE NEGATIVE   RBC / HPF 0-5 0 - 5 RBC/hpf   WBC, UA 0-5 0 - 5 WBC/hpf   Bacteria, UA RARE (A) NONE SEEN   Squamous Epithelial / LPF 0-5 0 - 5   Mucus PRESENT   Protein / creatinine ratio, urine     Status: Abnormal   Collection Time: 04/29/19 10:50 AM  Result Value Ref Range   Creatinine, Urine 127.26 mg/dL   Total Protein, Urine 307 mg/dL   Protein Creatinine Ratio 2.41 (H) 0.00 - 0.15 mg/mg[Cre]  Comprehensive metabolic panel     Status: Abnormal   Collection Time: 04/29/19 10:59 AM  Result Value Ref Range   Sodium 138 135 - 145 mmol/L   Potassium 4.3 3.5 - 5.1 mmol/L   Chloride 107 98 - 111 mmol/L   CO2 21 (L) 22 - 32 mmol/L   Glucose, Bld 93 70 - 99 mg/dL    BUN 15 6 - 20 mg/dL   Creatinine, Ser 0.72 0.44 - 1.00 mg/dL   Calcium 9.2 8.9 - 10.3 mg/dL   Total Protein 6.0 (L) 6.5 - 8.1 g/dL   Albumin 2.6 (L) 3.5 - 5.0 g/dL   AST 17 15 - 41 U/L   ALT 18 0 - 44 U/L   Alkaline Phosphatase 76 38 - 126 U/L   Total Bilirubin 0.2 (L) 0.3 - 1.2 mg/dL   GFR calc non Af Amer >60 >60 mL/min   GFR calc Af Amer >60 >60 mL/min   Anion gap 10 5 - 15  CBC     Status: Abnormal   Collection Time: 04/29/19 10:59 AM  Result Value Ref Range   WBC 13.3 (H) 4.0 - 10.5 K/uL   RBC 4.75 3.87 - 5.11 MIL/uL   Hemoglobin 13.6 12.0 - 15.0 g/dL   HCT 39.4 36.0 - 46.0 %   MCV 82.9 80.0 - 100.0 fL   MCH 28.6 26.0 - 34.0 pg   MCHC 34.5 30.0 - 36.0 g/dL   RDW 13.2 11.5 - 15.5 %   Platelets 284 150 - 400 K/uL   nRBC 0.0 0.0 - 0.2 %    A: [redacted]w[redacted]d week IUP Preeclampsia w/ severe features (BP)  P: Admit to [facility] per consult with Dr. Hulan Fray.  Recommends delivery at 34 weeks and inpatient management until delivery. NICU Consult  Mag Sulfate Received BMZ 04/19/19 and 04/20/19. Repeat dose not indicated Repeat Pre-E labs in am   [place], [place], [place] 04/29/2019 1:00 PM

## 2019-04-29 NOTE — MAU Provider Note (Signed)
Chief Complaint  Patient presents with  . BP Eval     First Provider Initiated Contact with Patient 04/29/19 1106      S: Alexis Ellis  is a 28 y.o. y.o. year old G21P0040 female at [redacted]w[redacted]d weeks gestation by LMP/5.5 week [place] who was sent to MAU with severely elevated blood pressures at [facility]. Hx Pr-E. Was admitted 04/19/19-04/23/19 Current blood pressure medication: None  Associated symptoms: Negative for headache, vision changes, epigastric pain Contractions: Denies Vaginal bleeding: Denies Fetal movement: Active  O:  Patient Vitals for the past 24 hrs:  04/29/19 1400 (!) 148/87 -- -- 86 -- -- -- --  04/29/19 1346 (!) 170/99 -- -- 97 -- -- -- --  04/29/19 1330 (!) 150/98 -- -- 94 -- -- -- --  04/29/19 1316 127/71 -- -- 88 -- -- -- --  04/29/19 1301 128/67 -- -- 90 -- -- -- --  04/29/19 1246 (!) 125/54 -- -- 90 -- -- -- --  04/29/19 1230 (!) 144/79 -- -- 83 -- -- -- --  04/29/19 1216 134/79 -- -- 88 -- -- -- --  04/29/19 1200 139/80 -- -- 89 -- -- -- --  04/29/19 1145 (!) 156/99 -- -- 86 -- -- -- --  04/29/19 1130 (!) 155/96 -- -- 87 -- -- -- --  04/29/19 1115 (!) 156/90 -- -- 93 -- -- -- --  04/29/19 1100 (!) 171/106 -- -- (!) 111 -- -- -- --  04/29/19 1046 (!) 172/114 -- -- (!) 111 -- -- -- --  04/29/19 1038 (!) 175/107 -- -- (!) 107 -- -- -- --  04/29/19 1028 (!) 162/105 98.1 F (36.7 C) Oral (!) 108 18 98 % -- --  04/29/19 1017 -- -- -- -- -- -- 5\' 5"  (1.651 m) 120.2 kg   General: NAD Heart: Regular rate Lungs: Normal rate and effort Abd: Soft, NT, Gravid, S=D Extremities: 2+Pedal edema Neuro: 2+ deep tendon reflexes, No clonus Pelvic: NEFG, no bleeding or LOF.      EFM: 140, Moderate variability, 15 x 15 accelerations, no decelerations Toco: UI  Results for orders placed or performed during the hospital encounter of 04/29/19 (from the past 24 hour(s))  Urinalysis, Routine w reflex microscopic     Status: Abnormal   Collection Time: 04/29/19 10:50 AM   Result Value Ref Range   Color, Urine YELLOW YELLOW   APPearance HAZY (A) CLEAR   Specific Gravity, Urine 1.025 1.005 - 1.030   pH 6.0 5.0 - 8.0   Glucose, UA NEGATIVE NEGATIVE mg/dL   Hgb urine dipstick NEGATIVE NEGATIVE   Bilirubin Urine NEGATIVE NEGATIVE   Ketones, ur NEGATIVE NEGATIVE mg/dL   Protein, ur 100 (A) NEGATIVE mg/dL   Nitrite NEGATIVE NEGATIVE   Leukocytes,Ua NEGATIVE NEGATIVE   RBC / HPF 0-5 0 - 5 RBC/hpf   WBC, UA 0-5 0 - 5 WBC/hpf   Bacteria, UA RARE (A) NONE SEEN   Squamous Epithelial / LPF 0-5 0 - 5   Mucus PRESENT   Protein / creatinine ratio, urine     Status: Abnormal   Collection Time: 04/29/19 10:50 AM  Result Value Ref Range   Creatinine, Urine 127.26 mg/dL   Total Protein, Urine 307 mg/dL   Protein Creatinine Ratio 2.41 (H) 0.00 - 0.15 mg/mg[Cre]  Comprehensive metabolic panel     Status: Abnormal   Collection Time: 04/29/19 10:59 AM  Result Value Ref Range   Sodium 138 135 - 145 mmol/L   Potassium 4.3  3.5 - 5.1 mmol/L   Chloride 107 98 - 111 mmol/L   CO2 21 (L) 22 - 32 mmol/L   Glucose, Bld 93 70 - 99 mg/dL   BUN 15 6 - 20 mg/dL   Creatinine, Ser 0.72 0.44 - 1.00 mg/dL   Calcium 9.2 8.9 - 10.3 mg/dL   Total Protein 6.0 (L) 6.5 - 8.1 g/dL   Albumin 2.6 (L) 3.5 - 5.0 g/dL   AST 17 15 - 41 U/L   ALT 18 0 - 44 U/L   Alkaline Phosphatase 76 38 - 126 U/L   Total Bilirubin 0.2 (L) 0.3 - 1.2 mg/dL   GFR calc non Af Amer >60 >60 mL/min   GFR calc Af Amer >60 >60 mL/min   Anion gap 10 5 - 15  CBC     Status: Abnormal   Collection Time: 04/29/19 10:59 AM  Result Value Ref Range   WBC 13.3 (H) 4.0 - 10.5 K/uL   RBC 4.75 3.87 - 5.11 MIL/uL   Hemoglobin 13.6 12.0 - 15.0 g/dL   HCT 39.4 36.0 - 46.0 %   MCV 82.9 80.0 - 100.0 fL   MCH 28.6 26.0 - 34.0 pg   MCHC 34.5 30.0 - 36.0 g/dL   RDW 13.2 11.5 - 15.5 %   Platelets 284 150 - 400 K/uL   nRBC 0.0 0.0 - 0.2 %    A: [redacted]w[redacted]d week IUP Preeclampsia w/ severe features (BP)  P: Admit to [facility] per consult with Dr. Hulan Fray.  Recommends delivery at 34 weeks and inpatient management until delivery. NICU Consult  Mag Sulfate Received BMZ 04/19/19 and 04/20/19. Repeat dose not indicated Repeat Pre-E labs in am   [place], [place], [place] 04/29/2019 1:00 PM

## 2019-04-29 NOTE — MAU Note (Signed)
Presents for BP evaluation, sent from MD office.  Denies H/A, visual disturbances, and epigastric pain.  Reports +FM.  Denies VB or LOF.

## 2019-04-29 NOTE — Progress Notes (Signed)
   PRENATAL VISIT NOTE  Subjective:  Alexis Ellis is a 28 y.o. [id number] at [redacted]w[redacted]d being seen today for ongoing prenatal care.  She is currently monitored for the following issues for this high-risk pregnancy and has Elevated blood pressure affecting pregnancy in third trimester, antepartum; ADD (attention deficit disorder); Depression affecting pregnancy in second trimester, antepartum; Tension headache; HLD (hyperlipidemia); Obesity (BMI 35.0-39.9 without comorbidity); Supervision of high-risk pregnancy; History of ectopic pregnancy; History of multiple miscarriages; Abnormal Pap smear of cervix; Maternal obesity affecting pregnancy, antepartum; Diet controlled gestational diabetes mellitus (GDM) in third trimester; Gestational hypertension; and Severe pre-eclampsia on their problem list.  Patient reports no complaints.  Contractions: Not present. Vag. Bleeding: None.  Movement: Present. Denies leaking of fluid.   The following portions of the patient's history were reviewed and updated as appropriate: allergies, current medications, past family history, past medical history, past social history, past surgical history and problem list.   Objective:   Vitals:   04/29/19 0918 04/29/19 0921  BP: (!) 162/107 (!) 164/116  Pulse: (!) 109 (!) 112  Weight: 265 lb 1.6 oz (120.2 kg)     Fetal Status:     Movement: Present     General:  Alert, oriented and cooperative. Patient is in no acute distress.  Skin: Skin is warm and dry. No rash noted.   Cardiovascular: Normal heart rate noted  Respiratory: Normal respiratory effort, no problems with respiration noted  Abdomen: Soft, gravid, appropriate for gestational age.  Pain/Pressure: Present     Pelvic: Cervical exam deferred        Extremities: Normal range of motion.  Edema: Mild pitting, slight indentation  Mental Status: Normal mood and affect. Normal behavior. Normal judgment and thought content.   Assessment and Plan:  Pregnancy: [id number]  at [redacted]w[redacted]d 1. Severe pre-eclampsia in third trimester I feel patient meets criteria for this and needs hospitalization until delivery. May need maintenance antihypertensives until delivery.  Talked to Dr. Roselie Awkward at [facility], MFM to be re-consulted.  Possible plan of delivery at 34 weeks.  2. Diet controlled gestational diabetes mellitus (GDM) in third trimester Will be evaluated at [facility]  3. Supervision of high risk pregnancy in third trimester Patient sent to [facility]; MAU staff notified.   Please refer to After Visit Summary for other counseling recommendations.   No follow-ups on file.  Future Appointments  Date Time Provider [place]  05/04/2019  7:15 AM [place] MFC-US  05/04/2019  7:15 AM WH-MFC [place] 4 WH-MFCUS MFC-US  05/04/2019 10:15 AM Constant, Peggy, MD [place] None  05/11/2019  7:50 AM WH-MFC NURSE WH-MFC MFC-US  05/11/2019  8:00 AM WH-MFC [place] 1 WH-MFCUS MFC-US  05/17/2019  7:40 AM WH-MFC NURSE WH-MFC MFC-US  05/17/2019  7:45 AM WH-MFC [place] 2 WH-MFCUS MFC-US  05/24/2019  9:40 AM WH-MFC NURSE WH-MFC MFC-US  05/24/2019  9:45 AM [place] 2 WH-MFCUS MFC-US    Verita Schneiders, MD

## 2019-04-30 ENCOUNTER — Encounter (HOSPITAL_COMMUNITY): Payer: Self-pay | Admitting: Obstetrics & Gynecology

## 2019-04-30 ENCOUNTER — Other Ambulatory Visit: Payer: Self-pay

## 2019-04-30 LAB — COMPREHENSIVE METABOLIC PANEL
ALT: 16 U/L (ref 0–44)
AST: 16 U/L (ref 15–41)
Albumin: 2.2 g/dL — ABNORMAL LOW (ref 3.5–5.0)
Alkaline Phosphatase: 69 U/L (ref 38–126)
Anion gap: 10 (ref 5–15)
BUN: 6 mg/dL (ref 6–20)
CO2: 23 mmol/L (ref 22–32)
Calcium: 7.5 mg/dL — ABNORMAL LOW (ref 8.9–10.3)
Chloride: 101 mmol/L (ref 98–111)
Creatinine, Ser: 0.6 mg/dL (ref 0.44–1.00)
GFR calc Af Amer: >60 mL/min (ref >60)
GFR calc non Af Amer: >60 mL/min (ref >60)
Glucose, Bld: 93 mg/dL (ref 70–99)
Potassium: 4.4 mmol/L (ref 3.5–5.1)
Sodium: 134 mmol/L — ABNORMAL LOW (ref 135–145)
Total Bilirubin: 0.3 mg/dL (ref 0.3–1.2)
Total Protein: 5.4 g/dL — ABNORMAL LOW (ref 6.5–8.1)

## 2019-04-30 LAB — GLUCOSE, CAPILLARY
Glucose-Capillary: 100 mg/dL — ABNORMAL HIGH (ref 70–99)
Glucose-Capillary: 99 mg/dL (ref 70–99)
Glucose-Capillary: 133 mg/dL — ABNORMAL HIGH (ref 70–99)
Glucose-Capillary: 160 mg/dL — ABNORMAL HIGH (ref 70–99)
Glucose-Capillary: 101 mg/dL — ABNORMAL HIGH (ref 70–99)

## 2019-04-30 LAB — CBC
HCT: 39.1 % (ref 36.0–46.0)
Hemoglobin: 13.2 g/dL (ref 12.0–15.0)
MCH: 28.3 pg (ref 26.0–34.0)
MCHC: 33.8 g/dL (ref 30.0–36.0)
MCV: 83.9 fL (ref 80.0–100.0)
Platelets: 252 10*3/uL (ref 150–400)
RBC: 4.66 MIL/uL (ref 3.87–5.11)
RDW: 13.3 % (ref 11.5–15.5)
WBC: 9.8 10*3/uL (ref 4.0–10.5)
nRBC: 0 % (ref 0.0–0.2)

## 2019-04-30 LAB — RPR: RPR Ser Ql: NONREACTIVE

## 2019-04-30 MED ORDER — FENTANYL CITRATE (PF) 100 MCG/2ML IJ SOLN
100.0000 ug | INTRAMUSCULAR | Status: DC | PRN
  Administered 2019-04-30 – 2019-05-01 (×3): 100 ug via INTRAVENOUS
  Filled 2019-04-30 (×3): qty 2

## 2019-04-30 MED ORDER — BUTALBITAL-APAP-CAFFEINE 50-325-40 MG PO TABS
1.0000 | ORAL_TABLET | Freq: Once | ORAL | Status: AC
  Administered 2019-04-30: 1 via ORAL
  Filled 2019-04-30: qty 1

## 2019-04-30 MED ORDER — OXYTOCIN 40 UNITS IN NORMAL SALINE INFUSION - SIMPLE MED: 1.0000 m[IU]/min | INTRAVENOUS | Status: DC

## 2019-04-30 MED ORDER — OXYTOCIN 40 UNITS IN NORMAL SALINE INFUSION - SIMPLE MED
INTRAVENOUS | Status: AC
  Administered 2019-05-01: 2 m[IU]/min via INTRAVENOUS
  Filled 2019-04-30: qty 1000

## 2019-04-30 MED ORDER — TERBUTALINE SULFATE 1 MG/ML IJ SOLN: 0.2500 mg | Freq: Once | INTRAMUSCULAR | Status: DC | PRN

## 2019-04-30 MED ORDER — FENTANYL CITRATE (PF) 100 MCG/2ML IJ SOLN
INTRAMUSCULAR | Status: AC
  Administered 2019-04-30: 100 ug via INTRAVENOUS
  Filled 2019-04-30: qty 2

## 2019-04-30 MED ORDER — MISOPROSTOL 50MCG HALF TABLET
50.0000 ug | ORAL_TABLET | ORAL | Status: DC
  Administered 2019-04-30 (×3): 50 ug via BUCCAL
  Filled 2019-04-30 (×3): qty 1

## 2019-04-30 NOTE — Progress Notes (Signed)
Patient ID: Alexis Ellis, female   DOB: Mar 06, 1991, 28 y.o.   MRN: [id number] [place]) NOTE  Alexis Ellis is a 28 y.o. [id number] at [redacted]w[redacted]d who is admitted for severe preeclampsia.   Fetal presentation is cephalic. Length of Stay:  1  Days  Subjective: Severe headache Patient reports the fetal movement as active. Patient reports uterine contraction  activity as none. Patient reports  vaginal bleeding as none. Patient describes fluid per vagina as None.  Vitals:  Blood pressure (!) 151/95, pulse 72, temperature 98.3 F (36.8 C), temperature source Oral, resp. rate 18, height 5\' 5"  (1.651 m), weight 120.2 kg, last menstrual period 09/12/2018, SpO2 100 %, unknown if currently breastfeeding. Physical Examination:  General appearance - alert, well appearing, and in no distress and in mild to moderate distress Heart - normal rate and regular rhythm Abdomen - soft, nontender, nondistended Fundal Height:  size equals dates Cervical Exam: Not evaluated. Extremities: extremities normal, atraumatic, no cyanosis or edema and Homans sign is negative, no sign of DVT  Membranes:intact  Fetal Monitoring:     Fetal Heart Rate A  Mode External  [adjusted] filed at 04/30/2019 [id number]  Baseline Rate (A) 120 bpm filed at 04/30/2019 0700  Variability 6-25 BPM filed at 04/30/2019 0700  Accelerations 15 x 15 filed at 04/30/2019 0700  Decelerations None filed at 04/30/2019 0700     Labs:  Results for orders placed or performed during the hospital encounter of 04/29/19 (from the past 24 hour(s))  Urinalysis, Routine w reflex microscopic   Collection Time: 04/29/19 10:50 AM  Result Value Ref Range   Color, Urine YELLOW YELLOW   APPearance HAZY (A) CLEAR   Specific Gravity, Urine 1.025 1.005 - 1.030   pH 6.0 5.0 - 8.0   Glucose, UA NEGATIVE NEGATIVE mg/dL   Hgb urine dipstick NEGATIVE NEGATIVE   Bilirubin Urine NEGATIVE NEGATIVE   Ketones, ur NEGATIVE NEGATIVE  mg/dL   Protein, ur 100 (A) NEGATIVE mg/dL   Nitrite NEGATIVE NEGATIVE   Leukocytes,Ua NEGATIVE NEGATIVE   RBC / HPF 0-5 0 - 5 RBC/hpf   WBC, UA 0-5 0 - 5 WBC/hpf   Bacteria, UA RARE (A) NONE SEEN   Squamous Epithelial / LPF 0-5 0 - 5   Mucus PRESENT   Protein / creatinine ratio, urine   Collection Time: 04/29/19 10:50 AM  Result Value Ref Range   Creatinine, Urine 127.26 mg/dL   Total Protein, Urine 307 mg/dL   Protein Creatinine Ratio 2.41 (H) 0.00 - 0.15 mg/mg[Cre]  Type and screen [place]   Collection Time: 04/29/19 10:55 AM  Result Value Ref Range   ABO/RH(D) A POS    Antibody Screen NEG    Sample Expiration      05/02/2019,2359 Performed at [facility], [address] [place], [place] [address]   Comprehensive metabolic panel   Collection Time: 04/29/19 10:59 AM  Result Value Ref Range   Sodium 138 135 - 145 mmol/L   Potassium 4.3 3.5 - 5.1 mmol/L   Chloride 107 98 - 111 mmol/L   CO2 21 (L) 22 - 32 mmol/L   Glucose, Bld 93 70 - 99 mg/dL   BUN 15 6 - 20 mg/dL   Creatinine, Ser 0.72 0.44 - 1.00 mg/dL   Calcium 9.2 8.9 - 10.3 mg/dL   Total Protein 6.0 (L) 6.5 - 8.1 g/dL   Albumin 2.6 (L) 3.5 - 5.0 g/dL   AST 17 15 - 41  U/L   ALT 18 0 - 44 U/L   Alkaline Phosphatase 76 38 - 126 U/L   Total Bilirubin 0.2 (L) 0.3 - 1.2 mg/dL   GFR calc non Af Amer >60 >60 mL/min   GFR calc Af Amer >60 >60 mL/min   Anion gap 10 5 - 15  CBC   Collection Time: 04/29/19 10:59 AM  Result Value Ref Range   WBC 13.3 (H) 4.0 - 10.5 K/uL   RBC 4.75 3.87 - 5.11 MIL/uL   Hemoglobin 13.6 12.0 - 15.0 g/dL   HCT 39.4 36.0 - 46.0 %   MCV 82.9 80.0 - 100.0 fL   MCH 28.6 26.0 - 34.0 pg   MCHC 34.5 30.0 - 36.0 g/dL   RDW 13.2 11.5 - 15.5 %   Platelets 284 150 - 400 K/uL   nRBC 0.0 0.0 - 0.2 %  Glucose, capillary   Collection Time: 04/29/19  9:18 PM  Result Value Ref Range   Glucose-Capillary 118 (H) 70 - 99 mg/dL  Comprehensive metabolic panel   Collection  Time: 04/30/19  5:33 AM  Result Value Ref Range   Sodium 134 (L) 135 - 145 mmol/L   Potassium 4.4 3.5 - 5.1 mmol/L   Chloride 101 98 - 111 mmol/L   CO2 23 22 - 32 mmol/L   Glucose, Bld 93 70 - 99 mg/dL   BUN 6 6 - 20 mg/dL   Creatinine, Ser 0.60 0.44 - 1.00 mg/dL   Calcium 7.5 (L) 8.9 - 10.3 mg/dL   Total Protein 5.4 (L) 6.5 - 8.1 g/dL   Albumin 2.2 (L) 3.5 - 5.0 g/dL   AST 16 15 - 41 U/L   ALT 16 0 - 44 U/L   Alkaline Phosphatase 69 38 - 126 U/L   Total Bilirubin 0.3 0.3 - 1.2 mg/dL   GFR calc non Af Amer >60 >60 mL/min   GFR calc Af Amer >60 >60 mL/min   Anion gap 10 5 - 15  CBC   Collection Time: 04/30/19  5:33 AM  Result Value Ref Range   WBC 9.8 4.0 - 10.5 K/uL   RBC 4.66 3.87 - 5.11 MIL/uL   Hemoglobin 13.2 12.0 - 15.0 g/dL   HCT 39.1 36.0 - 46.0 %   MCV 83.9 80.0 - 100.0 fL   MCH 28.3 26.0 - 34.0 pg   MCHC 33.8 30.0 - 36.0 g/dL   RDW 13.3 11.5 - 15.5 %   Platelets 252 150 - 400 K/uL   nRBC 0.0 0.0 - 0.2 %  Glucose, capillary   Collection Time: 04/30/19  6:04 AM  Result Value Ref Range   Glucose-Capillary 100 (H) 70 - 99 mg/dL     Medications:  Scheduled . docusate sodium  100 mg Oral Daily  . prenatal multivitamin  1 tablet Oral Q1200   I have reviewed the patient's current medications.  ASSESSMENT: Patient Active Problem List   Diagnosis Date Noted  . Preeclampsia, severe, third trimester 04/29/2019  . Severe pre-eclampsia 04/20/2019  . Gestational hypertension 04/19/2019  . Diet controlled gestational diabetes mellitus (GDM) in third trimester 03/23/2019  . Maternal obesity affecting pregnancy, antepartum 12/28/2018  . Abnormal Pap smear of cervix 12/05/2018  . Supervision of high-risk pregnancy 11/30/2018  . History of ectopic pregnancy 11/30/2018  . History of multiple miscarriages 11/30/2018  . Tension headache 09/29/2015  . HLD (hyperlipidemia) 09/29/2015  . Obesity (BMI 35.0-39.9 without comorbidity) 09/29/2015  . ADD (attention deficit  disorder) 11/07/2013  . Depression  affecting pregnancy in second trimester, antepartum 11/07/2013  . Elevated blood pressure affecting pregnancy in third trimester, antepartum 11/01/2013    PLAN: Severe preeclampsia with normal labs but persistent severe headache. Will send to L&D for IOL, continue magnesium  Emeterio Reeve 04/30/2019,9:18 AM

## 2019-04-30 NOTE — Progress Notes (Signed)
Alexis Ellis is a 28 y.o. [id number] at [redacted]w[redacted]d admitted for induction of labor due to worsening Pre E with severe features.  Subjective: Comfortably sleeping.  Objective: BP (!) 148/87   Pulse 76   Temp 98.1 F (36.7 C) (Oral)   Resp 18   Ht 5\' 5"  (1.651 m)   Wt 120.2 kg   LMP 09/12/2018   SpO2 100%   BMI 44.10 kg/m  Total I/O In: 1843.1 [P.O.:720; I.V.:1123.1] Out: 850 [Urine:850]  FHT:  FHR:120-130  bpm, variability: moderate,  accelerations:  Present,  decelerations:  Absent UC:   none  SVE:   Dilation: 1 Effacement (%): Thick Station: -2 Exam by:: Casilda Carls RN  Labs: Lab Results  Component Value Date   WBC 9.8 04/30/2019   HGB 13.2 04/30/2019   HCT 39.1 04/30/2019   MCV 83.9 04/30/2019   PLT 252 04/30/2019    Assessment / Plan: Alexis Ellis is a 28 y.o G5P0040 at [redacted]w[redacted]d here for IOL secondary to Pre Eclampsia with severe features(Headaches, vision changes, severe range BP)  Labor: s/p Cytotec x2.  Consider FB at next check.  AROM, Pitocin as approprriate Fetal Wellbeing:  Category I Pain Control:  Labor support without medications Pre-eclampsia: Continue Mag drip, No Headaches or severe range BP I/D:  GBS negative Anticipated MOD:  Vaginal Delivery, CS as appropriate  Carollee Leitz MD PGY1 Family Med Practice 04/30/2019, 4:08 PM

## 2019-04-30 NOTE — Consult Note (Signed)
Neonatology Antenatal Consultation: Referred by: Roselie Awkward Reason: 33 weeks pre-eclampsia, induction  I met with the parents today regarding the expected preterm delivery.  I explained the usual course of treatment which would include tube feeding and the need for pumping of breastmilk in order to provide a sufficient nutrition for the child.  I explained the usual expected length of stay.  We discussed the low likelihood for the need for respiratory support, but that monitoring would be needed for 2 or 3 weeks after delivery.  Parents had no additional questions.  R L Jamel Dunton M.D.

## 2019-04-30 NOTE — Progress Notes (Signed)
Dr Roselie Awkward notified of worsening pre-e symptoms. Pt moving to labor and delivery for IOL

## 2019-04-30 NOTE — Progress Notes (Signed)
Labor Progress Note Alexis Ellis is a 28 y.o. [id number] at [redacted]w[redacted]d presented for IOL due to [facility] w/ severe features.  S: She is feeling well, asymptomatic.   O:  BP (!) 156/88   Pulse 75   Temp 98.1 F (36.7 C) (Oral)   Resp 18   Ht 5\' 5"  (1.651 m)   Wt 120.2 kg   LMP 09/12/2018   SpO2 100%   BMI 44.10 kg/m  EFM: 130/moderate variability/+accels, no decels  CVE: Dilation: 2 Effacement (%): 30 Station: Ballotable Presentation: Vertex Exam by:: Dr. Lia Foyer   A&P: 28 y.o. G5P0040 [redacted]w[redacted]d IOL due to [facility] w/ severe features.   #Labor: Progressing well. S/p cytotec x3 doses, last was at 1916. Will likely need another dose around 2300. Placed FB w/o complication. Anticipate vaginal delivery. #Pain: per patient request #FWB: Cat 1 FHT #GBS negative # A1gDM: last BG was elevated to 166, repeat down to 101. Will continue to monitor q4h in latent labor and q2h in active labor.  # Pre-eclampsia w/ severe features: BP elevated after FB placement, likely related to pain. Otherwise asymptomatic. Continue Mg gtt at 2g/h. Labetalol and hydralazine prn.   Demetrius Revel, MD 9:17 PM

## 2019-04-30 NOTE — Progress Notes (Signed)
[name] is a 28 y.o. [id number] at [redacted]w[redacted]d admitted for induction of labor due to worsening Pre E with severe features.  Subjective: Comfortable.  No headache, visual disturbances or RUQ pain. No LOF or VB  Objective: BP (!) 152/71   Pulse 75   Temp 98.1 F (36.7 C) (Oral)   Resp 18   Ht 5\' 5"  (1.651 m)   Wt 120.2 kg   LMP 09/12/2018   SpO2 100%   BMI 44.10 kg/m  No intake/output data recorded.  FHT:  FHR:130  bpm, variability: Moderate,  Accelerations: Present,  decelerations:  Absent UC: None  SVE:   Dilation: 1 Effacement (%): Thick Station: -3 Exam by:: [place]  Labs: Lab Results  Component Value Date   WBC 9.8 04/30/2019   HGB 13.2 04/30/2019   HCT 39.1 04/30/2019   MCV 83.9 04/30/2019   PLT 252 04/30/2019    Assessment / Plan: ANTONINA PETROVIC is a 28 y.o G5P0040 at [redacted]w[redacted]d here for IOL secondary to Pre Eclampsia with severe features(Headaches, vision changes, severe range BP)  Labor: s/p Cytotec x2.  Consider FB at next check.  AROM, Pitocin as approprriate Fetal Wellbeing:  Category I Pain Control:  Labor support without medications Pre-eclampsia: Continue Mag drip, No Headaches or severe range BP GDMA1: Most recent CBG 166 after meal, will repeat CBG in 2hrs. If remains >130 consider starting Glucose Stabilizer I/D:  GBS negative Anticipated MOD:  Vaginal Delivery, CS as appropriate  Carollee Leitz MD PGY1 Family Med Practice 04/30/2019, 7:12 PM

## 2019-04-30 NOTE — Progress Notes (Signed)
CBG 133. Patient states she was snacking shortly before the blood sugar reading.

## 2019-04-30 NOTE — Progress Notes (Signed)
Alexis Ellis is a 28 y.o. [id number] at [redacted]w[redacted]d admitted for IOL 2/2 worsening Pre-E w/ SF  (HA, vision changes, severe range BPs).  Subjective: Labor plan discussed. Still has worsening headache. Sometimes sees stars in her vision, but none right now. No RUQ pain or SOB. Having hot flashes from magnesium. Feeling baby move.  Objective: BP (!) 151/95 (BP Location: Left Arm) Comment: RN notified  Pulse 72   Temp 98.3 F (36.8 C) (Oral)   Resp 18   Ht 5\' 5"  (1.651 m)   Wt 120.2 kg   LMP 09/12/2018   SpO2 100%   BMI 44.10 kg/m  Total I/O In: 675.2 [P.O.:300; I.V.:375.2] Out: 200 [Urine:200]  FHT:  FHR: 125-130 bpm, variability: moderate,  accelerations:  Present,  decelerations:  Absent UC:   none  SVE:   Dilation: Fingertip Station: -3 Exam by:: Petrice Beedy  Labs: Lab Results  Component Value Date   WBC 9.8 04/30/2019   HGB 13.2 04/30/2019   HCT 39.1 04/30/2019   MCV 83.9 04/30/2019   PLT 252 04/30/2019    Assessment / Plan: 28 yo G5P0040 at 32.6 EGA here for IOL 2/2 worsening pre-E with severe features (HA, vision changes, severe range BPs)  Labor: Bishop score 1, start cervical ripening with cytotec. Consider FB at next check. Fetal Wellbeing:  Category I Pain Control:  per patient request Pre-eclampsia: On Mag, still has headache, no severe range BPs at this time.  I/D:  GBS unknown.   Risks and benefits of induction were reviewed, including failure of method, prolonged labor, need for further intervention, risk of cesarean.  Patient and family seem to understand these risks and wish to proceed. Options of cytotec, foley bulb, AROM, and pitocin reviewed, with use of each discussed.  Anticipated MOD:  Vaginal, CS if indicated  Merilyn Baba DO OB Fellow, Faculty Practice 04/30/2019, 10:22 AM

## 2019-05-01 ENCOUNTER — Encounter (HOSPITAL_COMMUNITY): Payer: Self-pay | Admitting: Obstetrics & Gynecology

## 2019-05-01 DIAGNOSIS — O1414 Severe pre-eclampsia complicating childbirth: Secondary | ICD-10-CM

## 2019-05-01 DIAGNOSIS — Z3A33 33 weeks gestation of pregnancy: Secondary | ICD-10-CM

## 2019-05-01 DIAGNOSIS — Z3490 Encounter for supervision of normal pregnancy, unspecified, unspecified trimester: Secondary | ICD-10-CM | POA: Diagnosis present

## 2019-05-01 LAB — COMPREHENSIVE METABOLIC PANEL
ALT: 16 U/L (ref 0–44)
AST: 19 U/L (ref 15–41)
Albumin: 2.3 g/dL — ABNORMAL LOW (ref 3.5–5.0)
Alkaline Phosphatase: 69 U/L (ref 38–126)
Anion gap: 10 (ref 5–15)
BUN: 9 mg/dL (ref 6–20)
CO2: 22 mmol/L (ref 22–32)
Calcium: 7.4 mg/dL — ABNORMAL LOW (ref 8.9–10.3)
Chloride: 102 mmol/L (ref 98–111)
Creatinine, Ser: 0.77 mg/dL (ref 0.44–1.00)
GFR calc Af Amer: >60 mL/min (ref >60)
GFR calc non Af Amer: >60 mL/min (ref >60)
Glucose, Bld: 149 mg/dL — ABNORMAL HIGH (ref 70–99)
Potassium: 4.2 mmol/L (ref 3.5–5.1)
Sodium: 134 mmol/L — ABNORMAL LOW (ref 135–145)
Total Bilirubin: 0.2 mg/dL — ABNORMAL LOW (ref 0.3–1.2)
Total Protein: 5.4 g/dL — ABNORMAL LOW (ref 6.5–8.1)

## 2019-05-01 LAB — CBC
HCT: 37.9 % (ref 36.0–46.0)
Hemoglobin: 12.5 g/dL (ref 12.0–15.0)
MCH: 28.2 pg (ref 26.0–34.0)
MCHC: 33 g/dL (ref 30.0–36.0)
MCV: 85.4 fL (ref 80.0–100.0)
Platelets: 282 10*3/uL (ref 150–400)
RBC: 4.44 MIL/uL (ref 3.87–5.11)
RDW: 13.4 % (ref 11.5–15.5)
WBC: 15.1 10*3/uL — ABNORMAL HIGH (ref 4.0–10.5)
nRBC: 0 % (ref 0.0–0.2)

## 2019-05-01 LAB — GLUCOSE, CAPILLARY: Glucose-Capillary: 112 mg/dL — ABNORMAL HIGH (ref 70–99)

## 2019-05-01 MED ORDER — ZOLPIDEM TARTRATE 5 MG PO TABS: 5.0000 mg | ORAL_TABLET | Freq: Every evening | ORAL | Status: DC | PRN

## 2019-05-01 MED ORDER — BENZOCAINE-MENTHOL 20-0.5 % EX AERO
1.0000 "application " | INHALATION_SPRAY | CUTANEOUS | Status: DC | PRN
  Administered 2019-05-02: 1 via TOPICAL
  Filled 2019-05-01 (×2): qty 56

## 2019-05-01 MED ORDER — LACTATED RINGERS IV SOLN: INTRAVENOUS | Status: DC

## 2019-05-01 MED ORDER — LIDOCAINE HCL (PF) 1 % IJ SOLN: 30.0000 mL | INTRAMUSCULAR | Status: AC | PRN

## 2019-05-01 MED ORDER — OXYCODONE-ACETAMINOPHEN 5-325 MG PO TABS
1.0000 | ORAL_TABLET | Freq: Four times a day (QID) | ORAL | Status: DC | PRN
  Administered 2019-05-01 – 2019-05-02 (×2): 2 via ORAL
  Filled 2019-05-01 (×2): qty 2

## 2019-05-01 MED ORDER — OXYTOCIN BOLUS FROM INFUSION
500.0000 mL | Freq: Once | INTRAVENOUS | Status: AC
  Administered 2019-05-01: 500 mL via INTRAVENOUS

## 2019-05-01 MED ORDER — DIPHENHYDRAMINE HCL 25 MG PO CAPS: 25.0000 mg | ORAL_CAPSULE | Freq: Four times a day (QID) | ORAL | Status: DC | PRN

## 2019-05-01 MED ORDER — LIDOCAINE HCL (PF) 1 % IJ SOLN
INTRAMUSCULAR | Status: AC
  Administered 2019-05-01: 30 mL via SUBCUTANEOUS
  Filled 2019-05-01: qty 30

## 2019-05-01 MED ORDER — WITCH HAZEL-GLYCERIN EX PADS: 1.0000 "application " | MEDICATED_PAD | CUTANEOUS | Status: DC | PRN

## 2019-05-01 MED ORDER — AMLODIPINE BESYLATE 5 MG PO TABS
5.0000 mg | ORAL_TABLET | Freq: Every day | ORAL | Status: DC
  Administered 2019-05-01 – 2019-05-03 (×3): 5 mg via ORAL
  Filled 2019-05-01 (×3): qty 1

## 2019-05-01 MED ORDER — MAGNESIUM SULFATE 40 GM/1000ML IV SOLN
2.0000 g/h | INTRAVENOUS | Status: DC
  Administered 2019-05-01 (×2): 2 g/h via INTRAVENOUS
  Filled 2019-05-01 (×2): qty 1000

## 2019-05-01 MED ORDER — TETANUS-DIPHTH-ACELL PERTUSSIS 5-2.5-18.5 LF-MCG/0.5 IM SUSP: 0.5000 mL | Freq: Once | INTRAMUSCULAR | Status: DC

## 2019-05-01 MED ORDER — SIMETHICONE 80 MG PO CHEW: 80.0000 mg | CHEWABLE_TABLET | ORAL | Status: DC | PRN

## 2019-05-01 MED ORDER — ONDANSETRON HCL 4 MG PO TABS: 4.0000 mg | ORAL_TABLET | ORAL | Status: DC | PRN

## 2019-05-01 MED ORDER — ACETAMINOPHEN 325 MG PO TABS
650.0000 mg | ORAL_TABLET | ORAL | Status: DC | PRN
  Filled 2019-05-01: qty 2

## 2019-05-01 MED ORDER — OXYTOCIN 40 UNITS IN NORMAL SALINE INFUSION - SIMPLE MED
2.5000 [IU]/h | INTRAVENOUS | Status: DC
  Administered 2019-05-01: 2.5 [IU]/h via INTRAVENOUS

## 2019-05-01 MED ORDER — PRENATAL MULTIVITAMIN CH
1.0000 | ORAL_TABLET | Freq: Every day | ORAL | Status: DC
  Administered 2019-05-01 – 2019-05-02 (×2): 1 via ORAL
  Filled 2019-05-01 (×2): qty 1

## 2019-05-01 MED ORDER — DIBUCAINE (PERIANAL) 1 % EX OINT: 1.0000 "application " | TOPICAL_OINTMENT | CUTANEOUS | Status: DC | PRN

## 2019-05-01 MED ORDER — COCONUT OIL OIL
1.0000 "application " | TOPICAL_OIL | Status: DC | PRN
  Administered 2019-05-01: 1 via TOPICAL

## 2019-05-01 MED ORDER — IBUPROFEN 600 MG PO TABS
600.0000 mg | ORAL_TABLET | Freq: Four times a day (QID) | ORAL | Status: DC
  Administered 2019-05-01 – 2019-05-03 (×9): 600 mg via ORAL
  Filled 2019-05-01 (×9): qty 1

## 2019-05-01 MED ORDER — ONDANSETRON HCL 4 MG/2ML IJ SOLN: 4.0000 mg | INTRAMUSCULAR | Status: DC | PRN

## 2019-05-01 MED ORDER — SENNOSIDES-DOCUSATE SODIUM 8.6-50 MG PO TABS
2.0000 | ORAL_TABLET | ORAL | Status: DC
  Administered 2019-05-01 – 2019-05-02 (×2): 2 via ORAL
  Filled 2019-05-01 (×2): qty 2

## 2019-05-01 NOTE — Progress Notes (Signed)
Labor Progress Note Alexis Ellis is a 28 y.o. G5P0040 at [redacted]w[redacted]d presented for IOL due to [facility] w/ severe features. S: She is doing well, contractions ~q67min but difficult to track on external toco,  managing w/ fentanyl. No AMS, HA, vision changes, CP, SOB, RUQ pain.  O:  BP (!) 147/78   Pulse 67   Temp 98.2 F (36.8 C) (Axillary)   Resp 16   Ht 5\' 5"  (1.651 m)   Wt 120.2 kg   LMP 09/12/2018   SpO2 100%   BMI 44.10 kg/m  EFM: 115/mod var/+ accels, early decels  CVE: Dilation: 6 Effacement (%): 60 Station: -2 Presentation: Vertex Exam by:: Dr. Lia Foyer   A&P: 28 y.o. [id number] [redacted]w[redacted]d  IOL due to [facility] w/ severe features.  #Labor: Progressing well. S/p cytotec x3 doses, last was at 1916. FB fell out and she was started on pitocin at 0000, running at 54mU/min. S/p AROM at 0400 w/ blood tinged clear fluid, IUPC placed. Uptitrate pit per protocol. Anticipate vaginal delivery #Pain: declines epidural for now, cont IV fentanyl rpn #FWB: Cat 1 FHT #GBS negative # A1gDM: continue to monitor BG q2h in active labor. Has been appropriate so far.  # Pre-eclampsia w/ severe features: She had severe pressures around 2AM, requiring 1 dose of labetalol but otherwise pressures running 140-150s/70-80s. Remains asymptomatic. Continue Mg gtt at 2g/h. Labetalol and hydralazine prn.  Demetrius Revel, MD 4:16 AM

## 2019-05-01 NOTE — Progress Notes (Signed)
Labor Progress Note Alexis Ellis is a 28 y.o. G5P0040 at [redacted]w[redacted]d presented for IOL due to [facility] w/ severe features.  S: She is doing well, contraction getting more frequent but managing w/ fentanyl. No AMS, HA, vision changes, CP, SOB, RUQ pain.  O:  BP (!) 141/77   Pulse 75   Temp 98.2 F (36.8 C) (Axillary)   Resp 16   Ht 5\' 5"  (1.651 m)   Wt 120.2 kg   LMP 09/12/2018   SpO2 100%   BMI 44.10 kg/m  EFM: 120/mod var/+accels, no decels  CVE: Dilation: 5 Effacement (%): 50 Station: -3 Presentation: Vertex Exam by:: Dr. Lia Foyer   A&P: 28 y.o. [id number] [redacted]w[redacted]d IOL due to [facility] w/ severe features.  #Labor: Progressing well. Progressing well. S/p cytotec x3 doses, last was at 1916. FB fell out and she was started on pitocin at 0000, running at 44mU/min. Uptitrate per protocol.  #Pain: fentanyl prn #FWB: Cat 1 FHT #GBS negative # A1gDM: continue to monitor BG q4h in latent labor and q2h in active labor.  # Pre-eclampsia w/ severe features: Pressures running 140-150s/70-80s, not requiring prns. Otherwise asymptomatic, no longer has HA. Continue Mg gtt at 2g/h. Labetalol and hydralazine prn.   Demetrius Revel, MD 1:52 AM

## 2019-05-01 NOTE — Discharge Summary (Signed)
Postpartum Discharge Summary  Date of Service updated 05/03/19     Patient Name: Alexis Ellis DOB: 1990/09/15 MRN: [id number]  Date of admission: 04/29/2019 Delivering Provider: Lajean Manes   Date of discharge: 05/03/2019  Admitting diagnosis: Preeclampsia, severe, third trimester [O14.13] Encounter for induction of labor [Z34.90] Intrauterine pregnancy: [redacted]w[redacted]d    Secondary diagnosis:  Active Problems:   Preeclampsia, severe, third trimester   Encounter for induction of labor   SVD (spontaneous vaginal delivery)   Preterm delivery after induction of labor  Additional problems: Depression affecting pregnancy, abnormal pap smear of cervix, obesity      Discharge diagnosis: Preterm Pregnancy Delivered and Preeclampsia (severe)                                                                                                Post partum procedures:none  Augmentation: AROM, Pitocin, Cytotec and Foley Balloon  Complications: None  Hospital course:  Induction of Labor With Vaginal Delivery   28y.o. yo G5P0040 at 39w0das admitted to the hospital 04/29/2019 for induction of labor.  Indication for induction: Preeclampsia.  Patient had an uncomplicated labor course as follows: Membrane Rupture Time/Date: 4:00 AM ,05/01/2019   Intrapartum Procedures: Episiotomy: None [1]                                         Lacerations:  1st degree [2]  Patient had delivery of a Viable infant.  Information for the patient's newborn:  GlGwendolen, Hewlett0[[id number]]Delivery Method: Vaginal, Spontaneous(Filed from Delivery Summary)    05/01/2019  Details of delivery can be found in separate delivery note.  Patient had a routine postpartum course. She was maintained on magnesium x 24 hrs and transitation to Norvasc. BP well controlled at time of discharge. Pt progressed to ambulating, voiding, tolerating diet and good oral pain control. Felt pt amendable for discharge home. Discharge  medications, instructions and follow up reviewed with pt.   Patient is discharged home 05/03/19. Delivery time: 5:53 AM    Magnesium Sulfate received: Yes BMZ received: Yes Rhophylac:N/A MMR:N/A Transfusion:No  Physical exam  Vitals:   05/02/19 2033 05/02/19 2321 05/03/19 0510 05/03/19 0745  BP: 137/61 (!) 148/74 129/70 (!) 137/93  Pulse: 83 91 79 78  Resp: _0 Temp:  98.2 F (36.8 C) 98.4 F (36.9 C) 97.6 F (36.4 C)  TempSrc:  Oral Oral Oral  SpO2:  99% 98% 99%  Weight:      Height:       General: alert Lochia: appropriate Uterine Fundus: firm Incision: N/A DVT Evaluation: No evidence of DVT seen on physical exam. Labs: Lab Results  Component Value Date   WBC 15.1 (H) 05/01/2019   HGB 12.5 05/01/2019   HCT 37.9 05/01/2019   MCV 85.4 05/01/2019   PLT 282 05/01/2019   CMP Latest Ref Rng & Units 05/01/2019  Glucose 70 - 99 mg/dL 149(H)  BUN 6 - 20 mg/dL 9  Creatinine  0.44 - 1.00 mg/dL 0.77  Sodium 135 - 145 mmol/L 134(L)  Potassium 3.5 - 5.1 mmol/L 4.2  Chloride 98 - 111 mmol/L 102  CO2 22 - 32 mmol/L 22  Calcium 8.9 - 10.3 mg/dL 7.4(L)  Total Protein 6.5 - 8.1 g/dL 5.4(L)  Total Bilirubin 0.3 - 1.2 mg/dL 0.2(L)  Alkaline Phos 38 - 126 U/L 69  AST 15 - 41 U/L 19  ALT 0 - 44 U/L 16    Discharge instruction: per After Visit Summary and "Baby and Me Booklet".  After visit meds:  Allergies as of 05/03/2019   No Known Allergies     Medication List    STOP taking these medications   Accu-Chek FastClix Lancets Misc   Accu-Chek Guide test strip Generic drug: glucose blood   aspirin EC 81 MG tablet   butalbital-acetaminophen-caffeine 50-325-40 MG tablet Commonly known as: FIORICET   cyclobenzaprine 10 MG tablet Commonly known as: FLEXERIL   FLUoxetine 40 MG capsule Commonly known as: PROZAC   Misc. Devices Misc   pantoprazole 40 MG tablet Commonly known as: PROTONIX     TAKE these medications   acetaminophen 325 MG tablet Commonly  known as: TYLENOL Take 650 mg by mouth every 6 (six) hours as needed.   amLODipine 5 MG tablet Commonly known as: NORVASC Take 1 tablet (5 mg total) by mouth daily.   Blood Pressure Monitor Kit 1 Device by Does not apply route once a week. To be monitored Regularly at home.   ibuprofen 600 MG tablet Commonly known as: ADVIL Take 1 tablet (600 mg total) by mouth every 6 (six) hours.   Prenatal Gummies/DHA & FA 0.4-32.5 MG Chew Chew by mouth.       Diet: NICU  Activity: Advance as tolerated. Pelvic rest for 6 weeks.   Outpatient follow up:4 weeks Follow up Appt: Future Appointments  Date Time Provider [place]  05/31/2019  1:00 PM Constant, Vickii Chafe, MD [place] None   Follow up Visit: [place]. Schedule an appointment as soon as possible for a visit in 2 week(s).   Why: 2 weeks for BP check 4 weeks for PP visit Contact information: [place]  [id number] [phone]           Please schedule this patient for Postpartum visit in: 4 weeks with the following provider: MD For C/S patients schedule nurse incision check in weeks 2 weeks: no High risk pregnancy complicated by: PEC Delivery mode:  SVD Anticipated Birth Control:  other/unsure PP Procedures needed: [place]  Schedule Integrated [place] visit: yes   Newborn Data: Live born female  Birth Weight: 4 lb 0.6 oz (1830 g) APGAR: 8, 9  Newborn Delivery   Birth date/time: 05/01/2019 05:53:00 Delivery type: Vaginal, Spontaneous      Baby Feeding: Breast Disposition:NICU   05/03/2019 Chancy Milroy, MD

## 2019-05-02 NOTE — Lactation Note (Signed)
This note was copied from a baby's chart. Lactation Consultation Note  Patient Name: Alexis Ellis [id number] Date: 05/02/2019 Reason for consult: Initial assessment;Primapara;1st time breastfeeding;NICU baby;Preterm <34wks;Infant < 6lbs;Maternal endocrine disorder Type of Endocrine Disorder?: Diabetes, GHTN on MgSO4  Visited with P1 Mom and FOB of infant in the NICU.  Baby born at 84 weeks weighing 4 lbs 0.1 oz and is now 76 hrs old.    Mom was set up with the [employer] DEBP on first day and has been diligent with double pumping.  Mom states she has expressed EBM to transport to baby in the NICU.  Mom got to hold baby STS this am.    Assisted/assessed Mom pumping.  Mom has large breasts, and she was using 27 mm flanges.  After watching a few minutes, flange size changed to 24 mm for a better fit.  Nipple moving well in flange, no rubbing noted.  Mom has coconut oil to lubricate, but isn't complaining of any discomfort.  Reviewed initiation setting of pump, and need to switch to regular setting once she has expressed >20 ml twice.    Reviewed importance of breast massage and hand expression.  Website recommended, Dr. Harless Nakayama on hand expression.  Colostrum expressing during teaching session.  Mom started crying as she is so happy that baby is here and well, and that baby will be getting her own milk.  FOB getting colostrum container and label ready. Praised Mom and FOB on their commitment to pumping for their baby.   Mom has [place].  [facility] referral faxed for pump on discharge.  Mom does not have a pump at home.    Encouraged pumping every 2-3 hrs during the day, and 3-4 hrs at night.    Mom has NICU booklet and Lactation brochure.  Mom aware of lactation support available to her.    Interventions Interventions: Breast feeding basics reviewed;Skin to skin;Breast massage;Hand express;Adjust position;DEBP;Coconut oil;Support pillows  Lactation Tools Discussed/Used Tools: Pump;Coconut  oil Breast pump type: Double-Electric Breast Pump WIC Program: Yes Pump Review: Setup, frequency, and cleaning;Milk Storage Initiated by:: Antonito RN Date initiated:: 05/01/19   Consult Status Consult Status: Follow-up Date: 05/03/19 Follow-up type: In-patient    Broadus John 05/02/2019, 12:07 PM

## 2019-05-02 NOTE — Progress Notes (Signed)
Post Partum Day 1 Subjective: Pt feels good No CNS complaints Bleeding minimal   . amLODipine  5 mg Oral Daily  . ibuprofen  600 mg Oral Q6H  . prenatal multivitamin  1 tablet Oral Q1200  . senna-docusate  2 tablet Oral Q24H  . Tdap  0.5 mL Intramuscular Once   Objective: Blood pressure (!) 134/50, pulse 82, temperature 97.7 F (36.5 C), temperature source Oral, resp. rate 19, height 5\' 5"  (1.651 m), weight 120.2 kg, last menstrual period 09/12/2018, SpO2 100 %, unknown if currently breastfeeding.  Physical Exam:  General: alert, cooperative and no distress Lochia: appropriate Uterine Fundus: firm Incision:  DVT Evaluation: No evidence of DVT seen on physical exam.  Recent Labs    04/30/19 0533 05/01/19 1723  HGB 13.2 12.5  HCT 39.1 37.9    Assessment/Plan: Plan for discharge tomorrow and Breastfeeding  Condoms BCM Stop Mag this am Watch BP on Norvasc 5   LOS: 3 days   Florian Buff 05/02/2019, 7:08 AM

## 2019-05-02 NOTE — Lactation Note (Signed)
This note was copied from a baby's chart. Lactation Consultation Note LC attempted to see mom. Mom sleeping soundly. Left NICU and Lactation brochure at bedside.  Patient Name: Alexis Ellis [id number] Date: 05/02/2019     Maternal Data    Feeding Feeding Type: Formula  LATCH Score                   Interventions    Lactation Tools Discussed/Used     Consult Status      Theodoro Kalata 05/02/2019, 6:47 AM

## 2019-05-03 ENCOUNTER — Ambulatory Visit: Payer: Self-pay

## 2019-05-03 MED ORDER — AMLODIPINE BESYLATE 5 MG PO TABS: 5.0000 mg | ORAL_TABLET | Freq: Every day | ORAL | 1 refills | Status: DC

## 2019-05-03 MED ORDER — IBUPROFEN 600 MG PO TABS: 600.0000 mg | ORAL_TABLET | Freq: Four times a day (QID) | ORAL | 0 refills | Status: DC

## 2019-05-03 MED FILL — IBUPROFEN 600 MG TABLET: 600 | 8 days supply | Qty: 30 | Fill #0

## 2019-05-03 MED FILL — AMLODIPINE BESYLATE 5 MG TA: 5 | 30 days supply | Qty: 30 | Fill #0

## 2019-05-03 NOTE — Discharge Instructions (Signed)

## 2019-05-03 NOTE — Progress Notes (Signed)
Discharge instructions given to patient. Discussed postpartum instructions of vaginal delivery care, hypertension symptoms, medication changes and follow up appointments.  Patient verbalized understanding.   [facility]

## 2019-05-03 NOTE — Lactation Note (Addendum)
This note was copied from a baby's chart. Lactation Consultation Note  Patient Name: Alexis Ellis [id number] Date: 05/03/2019   Mom pumped recently & expressed almost 2 mL. Mom says she has been pumping q3hr (except at night, in which she allows herself to sleep 4-5 hrs at a time).  Mom's breasts palpate as filling. I showed Mom a different hand expression technique & she had tears of joy when she saw colostrum. Mom & Dad returned demonstration. Mom has good veins on her breasts & has had nipple piercings in the past.   Mom is going to rent a [place]. Paperwork provided; Mom will let me know when she is ready for me to return.   Mom has been happy with the size 24 flanges.  Matthias Hughs [facility] 05/03/2019, 9:18 AM

## 2019-05-03 NOTE — Lactation Note (Addendum)
This note was copied from a baby's chart. Lactation Consultation Note  Patient Name: Alexis Ellis [id number] Date: 05/03/2019   [facility] visit attempted, but Mom & Dad are in the middle of eating breakfast. This LC will return.   Mom noted to be on amlodipine 5 mg (L3).    Matthias Hughs [facility] 05/03/2019, 8:49 AM

## 2019-05-03 NOTE — Lactation Note (Signed)
This note was copied from a baby's chart. Lactation Consultation Note  Patient Name: Alexis Ellis [id number] Date: 05/03/2019  [facility] loaner provided.    Matthias Hughs [facility] 05/03/2019, 12:37 PM

## 2019-05-04 ENCOUNTER — Encounter (HOSPITAL_COMMUNITY): Payer: Self-pay

## 2019-05-04 ENCOUNTER — Telehealth: Payer: Medicaid Other | Admitting: Obstetrics and Gynecology

## 2019-05-04 ENCOUNTER — Ambulatory Visit (HOSPITAL_COMMUNITY): Payer: Medicaid Other

## 2019-05-04 LAB — SURGICAL PATHOLOGY

## 2019-05-09 ENCOUNTER — Ambulatory Visit: Payer: Self-pay

## 2019-05-09 NOTE — Lactation Note (Signed)
This note was copied from a baby's chart. Lactation Consultation Note  Patient Name: Alexis Ellis [id number] Date: 05/09/2019 Reason for consult: Follow-up assessment;Mother's request;Primapara;1st time breastfeeding;NICU baby;Late-preterm 34-36.6wks;Infant < 6lbs;Maternal endocrine disorder Type of Endocrine Disorder?: Diabetes  Mom requested a feeding assist, baby is entering into the 72 hours protected BF window per NICU infant driven policy. Baby was asleep in his bassinet when entering the room, dad was trying to wake him up. Mom is pumping about 8-10 times/24 hours and getting about 7-8 oz. of EBM combined per pumping session, praised her for her efforts.   Offered assistance with latch, and mom got ready to put baby to breast, she's always leaking, she had proud stains all over her t-shirt/dress; her NICU RN also made a comment about her great supply. Baby was very hard to woke up, [place] tried to do suck training but he won't even suck on a gloved finger. Per NICU RN, baby has been cueing a lot and spent nearly an hour with speech pathologist right before [facility] consultation.   LC took baby STS to mom's left breast but all he did was STS, he was asleep, would not wake up, and would not even open his mouth to try sucking. NICU RN commented he's been doing pacifier dippings with breastmilk and taking those really well, but that baby is probably just tired. An attempt was documented in [place].  Parent commented that feeding instructions given by lactation were different than the ones provided by Speech and language. Lactation stressed on the importance of STS care during feedings, but Speech and language tell parents to swaddle baby to feed. Explained to parents that STS care is the gold standard for feedings at the breast as long as baby's temperatures and vital signs are stable; they voiced understanding.   Feeding plan:  1. Encouraged mom to keep up with the great job and continue pumping  every 2-3 hours 2. Mom will continue taking baby to breast on cues during the 72 hours BF window, but she understands the expectations of a 8 day old pre-term baby born at 4 lbs 3. Mom will incorporate "lick and learn" at the breast when doing STS care whenever baby is cueing until he's ready to progress to BF.  Reviewed normal LPI behavior, feeding cues and pumping schedule. Parents reported all questions and concerns were answered, they're both aware of [facility] OP services and will call PRN.  Maternal Data    Feeding Feeding Type: Breast Fed  LATCH Score Latch: Too sleepy or reluctant, no latch achieved, no sucking elicited.  Audible Swallowing: None  Type of Nipple: Everted at rest and after stimulation(very short shafted when mom's breasts were full)  Comfort (Breast/Nipple): Soft / non-tender(but mom is filling in)  Hold (Positioning): Assistance needed to correctly position infant at breast and maintain latch.  LATCH Score: 5  Interventions Interventions: Breast feeding basics reviewed;Assisted with latch;Skin to skin;Breast massage;Hand express;Breast compression;Adjust position;Support pillows;DEBP  Lactation Tools Discussed/Used Tools: Pump Breast pump type: Double-Electric Breast Pump   Consult Status Consult Status: PRN Follow-up type: In-patient    Aziz Slape Francene Boyers 05/09/2019, 6:48 PM

## 2019-05-10 ENCOUNTER — Ambulatory Visit: Payer: Self-pay

## 2019-05-10 NOTE — Lactation Note (Signed)
This note was copied from a baby's chart. Lactation Consultation Note  Patient Name: Boy Fenna Fennema [id number] Date: 05/10/2019 Reason for consult: Follow-up assessment;Preterm <34wks;Infant < 6lbs;1st time breastfeeding;Primapara;NICU baby Type of Endocrine Disorder?: Diabetes(Pre-eclampsia)  P1 mother whose infant is now 44 days old.  This is a preterm infant born at 33+0 weeks with a CGA of 34+2 weeks, weighing <6 lbs and in the NICU.  Mother requested lactation: questions regarding breast feeding and pumping.  Baby was asleep in the bassinet when I arrived.  Mother had many questions related to breast feeding and pumping: admitted to being stressed and "anxious."  Spent considerable amount of time on basic eduction.  Provided the LPTI policy guideline instruction sheet and reviewed information.  Elaborated on the baby's current age and weight, allowing mother time to understand that her baby will take weeks to learn to be a proficient breast feeder.  Discussed cues (when appropriate) and responding to baby's needs.  Informed parents that he will tire easily and it will be important not to rush him or stress him if he is not ready to feed or latch.  Mother is quite anxious but is determined to be a "good" breast feeding mother.  She really desires to do a good job and let me know she felt that baby sensed her anxiety yesterday.  I reiterated the importance of trying to maintain a stress free attitude as much as possible.  Discussed proper eating, drinking and sleeping.  Invited parents to take "breaks" from visiting and being in the NICU to recharge.  Mother wants to be here as much as possible but informed her that, sometimes, breaks are invaluable for recharging.  Parents verbalized understanding.  Father seems to be a very good support for mother.  He is not anxious and provides good reassurance for mother.  He reminded her that she needs to be patient.  Manual pump with instructions for use  provided for nipple eversion.  I did not do a breast assessment at this time, but RN informed me that mother has flat nipples.  #27 size flange is the correct size that mother is currently using.  She is obtaining a good supply of milk when pumping and continues to pump on a consistent basis.    Baby did not try to latch at this feeding.  Educated mother on using her nurses for guidance to determine readiness for feedings.  Asked her, if possible, to make NICU appointments for latch assistance a day in advance.  Mother verbalized understanding.  RN updated.  Mother has a DEBP for home use and had no questions regarding her pump.  Father supportive.   Maternal Data Formula Feeding for Exclusion: No Has patient been taught Hand Expression?: Yes Does the patient have breastfeeding experience prior to this delivery?: No  Feeding Feeding Type: Breast Milk  LATCH Score                   Interventions    Lactation Tools Discussed/Used Pump Review: Setup, frequency, and cleaning(Manual pump given today) Initiated by:: Paul Dykes Date initiated:: 05/10/19   Consult Status Consult Status: PRN Date: 05/10/19 Follow-up type: Call as needed    Adams Hinch R Troi Bechtold 05/10/2019, 10:31 AM

## 2019-05-11 ENCOUNTER — Ambulatory Visit: Payer: Self-pay

## 2019-05-11 ENCOUNTER — Ambulatory Visit (HOSPITAL_COMMUNITY): Payer: Medicaid Other

## 2019-05-11 NOTE — Lactation Note (Signed)
This note was copied from a baby's chart. Lactation Consultation Note  Patient Name: Alexis Ellis [id number] Date: 05/11/2019 Reason for consult: Follow-up assessment Type of Endocrine Disorder?: Diabetes  Baby is 3 days old  NICU RN called LC for 9 am feeding and assistance for latch.  LC arrived at 68 and baby wide awake.  NICU RN placed baby STS and LC assisted mom to latch, actually was  Attempts several times 5-6 strong sucks , no swallows and off using the tea hold.  The NICU RN mentioned the baby is tongue tie. LC assessed and agree, some tongue mobility with tongue over the gum line.  When baby stuck his tongue out a mid line notch noted.  Mom has swollen semi compressible areolas. The prepumping with hand pump helped to elongate the nipple/ areola complex and reverse pressure.  Baby seemed to latch better and suckle strong 5-6 times and release .  LC stressed the importance of not trying to latch longer than 10-15 mins to prevent tiring the baby out.  1st attempt was STS , 2nd with the #24 NS( baby unable to accommodate the base of the NS and the #20 NS was border line fit and the baby attempted. LC stressed once the baby does latch and has a stronger suck with the Nipple Shield probably will be to snug  for mom.  LC recommended keeping the attempts to latch around the feeding times.  LC praised mom for her consistent pumping and pumping off 4 oz off each breast. Also for the baby opening wide and showing interest to latch. Mom denies engorgement and the #27 F is comfortable.  LC instructed mom on the use of shells between feedings except when sleeping or at least 10 mins prior to feeding.  Mom has a shells , and pump and a DEBP.    Maternal Data Has patient been taught Hand Expression?: Yes  Feeding Feeding Type: Breast Fed  LATCH Score Latch: Grasps breast easily, tongue down, lips flanged, rhythmical sucking.  Audible Swallowing: None  Type of Nipple: Everted at rest  and after stimulation  Comfort (Breast/Nipple): Filling, red/small blisters or bruises, mild/mod discomfort  Hold (Positioning): Assistance needed to correctly position infant at breast and maintain latch.  LATCH Score: 6  Interventions Interventions: Breast feeding basics reviewed  Lactation Tools Discussed/Used Tools: Nipple Shields;Pump;Shells;Flanges Nipple shield size: 20;24(#24 NS to big for baby's mouth and the #20 NS boarderline snug for moms nipple - few sucks) Flange Size: 27 Shell Type: Inverted Breast pump type: Manual;Double-Electric Breast Pump   Consult Status Consult Status: PRN Follow-up type: In-patient    [place] 05/11/2019, 9:55 AM

## 2019-05-15 ENCOUNTER — Other Ambulatory Visit: Payer: Self-pay | Admitting: Obstetrics and Gynecology

## 2019-05-15 ENCOUNTER — Ambulatory Visit: Payer: Self-pay

## 2019-05-15 NOTE — Lactation Note (Signed)
This note was copied from a baby's chart. Lactation Consultation Note  Patient Name: Alexis Ellis [id number] Date: 05/15/2019 Reason for consult: Follow-up assessment;Infant < 6lbs;Late-preterm 34-36.6wks;1st time breastfeeding;Primapara;NICU baby  NICU RN requested [place] assistance with breastfeeding.   P47 Mom of 19 week old baby at 57 wks AGA.  Baby is primarily being fed by NG tube.  Mom pumping and has an abundant milk supply.  Encouraged Mom to continue pumping regularly.    Had Mom position herself in recliner so enable her to be comfortable.  Pillow support used to provide baby at height of breasts.  Mom wanting to use cradle, instructed on cross cradle hold to better control baby's position and latch.  Baby opening his mouth, but is unable to sustain a deep latch to breast.  He appears somewhat fussy/gassy and he was popping on and off the breast.   Initiated a 20 mm nipple shield, showing Mom how to apply correctly to fill the shield with as much nipple as possible.  Baby able to latch and stay deeply on the breast if breast was supported and compressed.  Mom's left hand started throbbing, so I assisted her in wrapping her arms around baby into a cradle hold, while LC held her breast from above.  LC sandwiched breast and assisted Mom with angling baby's head so chin in close to breast.  Baby sucked and swallowed on and off for 15 mins.  Milk noted in shield after baby came off.  Mom was thrilled.  Praised Mom for her diligent pumping to provide for her baby.    Baby being treated for gas.  Talked to Mom about avoiding dairy in her diet for a week or so to see if that would have a positive affect on baby.  Reminded Mom that cow's milk protein in breast milk, can adversely affect baby causing gas and spitting.   Mom knows to call prn for concerns.  Feeding Feeding Type: Breast Fed  LATCH Score Latch: Grasps breast easily, tongue down, lips flanged, rhythmical sucking.  Audible Swallowing:  A few with stimulation  Type of Nipple: Everted at rest and after stimulation  Comfort (Breast/Nipple): Soft / non-tender  Hold (Positioning): Assistance needed to correctly position infant at breast and maintain latch.  LATCH Score: 8  Interventions Interventions: Breast feeding basics reviewed;Assisted with latch;Skin to skin;Breast massage;Hand express;Pre-pump if needed;Breast compression;Adjust position;Support pillows;Position options;DEBP  Lactation Tools Discussed/Used Tools: Nipple [place] [employer];Pump Nipple shield size: 20 Breast pump type: Double-Electric Breast Pump   Consult Status Consult Status: PRN Date: 05/16/19 Follow-up type: In-patient    Alexis Ellis 05/15/2019, 3:02 PM

## 2019-05-17 ENCOUNTER — Telehealth (INDEPENDENT_AMBULATORY_CARE_PROVIDER_SITE_OTHER): Payer: Medicaid Other

## 2019-05-17 ENCOUNTER — Ambulatory Visit (HOSPITAL_COMMUNITY): Payer: Medicaid Other

## 2019-05-17 DIAGNOSIS — Z0130 Encounter for examination of blood pressure without abnormal findings: Secondary | ICD-10-CM

## 2019-05-17 NOTE — Progress Notes (Signed)
Subjective:  Alexis Ellis is a 30 y.o. female virtual visit for BP check.  S/p SVD 05/01/2019 at 33 wks d/t preeclampsia  Pt reports taking Amlodipine 5 mg daily but forgot to take it today.  Hypertension ROS:  Not taking medications regularly as instructed, no medication side effects noted, no TIA's, no chest pain on exertion, no dyspnea on exertion and no swelling of ankles  Objective:  LMP 09/12/2018   Alert, non face to face visit   Assessment:   Blood Pressure needs improvement.   Plan:  Pt encouraged to take BP meds today and daily as instructed  Keep upcoming pp visit 05/31/2019

## 2019-05-17 NOTE — Progress Notes (Signed)
Patient seen and assessed by nursing staff during this encounter. I have reviewed the chart and agree with the documentation and plan.  Mora Bellman, MD 05/17/2019 3:15 PM

## 2019-05-21 ENCOUNTER — Ambulatory Visit: Payer: Self-pay

## 2019-05-21 NOTE — Lactation Note (Signed)
This note was copied from a baby's chart. Lactation Consultation Note  Patient Name: Alexis Ellis [id number] Date: 05/21/2019 Reason for consult: Follow-up assessment;NICU baby Alexis Ellis is 64 weeks old and 35.6 PMA.  Mom states she is pumping 6 times per day but supply has decreased.  She obtains 60-90 mls.  Mom states baby was breastfeeding well but now shows no interest in breast.  Discussed at length how baby's premature gestational age impacts feeding at breast.  Mom is very anxious about difficulty with feedings.  A lot of support and reassurance given.  Explained that baby will need to reach term or after to feed well and transfer milk.  Mom is worried about baby's tongue tie.  I told mom I recommended coming back for an outpatient appointment once term so feeding and transfer can be evaluated.  Assisted with positioning baby in football hold.  Mom's nipples flat and baby unable to grasp tissue.  20 mm nipple shield applied.  Baby sucked weakly off and on for 15 minutes.  Feeding non-nutritive and no swallows noted.  Milk in shield when baby came off.  Many questions answered and reassured baby's behavior was normal for 35.6 weeks.  Encouraged to call for assist prn.  Maternal Data    Feeding Feeding Type: Breast Fed  LATCH Score Latch: Repeated attempts needed to sustain latch, nipple held in mouth throughout feeding, stimulation needed to elicit sucking reflex.  Audible Swallowing: None  Type of Nipple: Flat  Comfort (Breast/Nipple): Soft / non-tender  Hold (Positioning): Assistance needed to correctly position infant at breast and maintain latch.  LATCH Score: 5  Interventions    Lactation Tools Discussed/Used Tools: Nipple Shields Nipple shield size: 20   Consult Status Consult Status: PRN Follow-up type: Call as needed    Ave Filter 05/21/2019, 11:45 AM

## 2019-05-24 ENCOUNTER — Ambulatory Visit (HOSPITAL_COMMUNITY): Payer: Medicaid Other

## 2019-05-31 ENCOUNTER — Ambulatory Visit: Payer: Medicaid Other | Admitting: Obstetrics and Gynecology

## 2019-06-14 ENCOUNTER — Ambulatory Visit: Payer: Medicaid Other | Admitting: Obstetrics and Gynecology

## 2019-06-22 DIAGNOSIS — M654 Radial styloid tenosynovitis [de Quervain]: Secondary | ICD-10-CM | POA: Diagnosis not present

## 2019-06-30 ENCOUNTER — Other Ambulatory Visit (HOSPITAL_COMMUNITY)
Admission: RE | Admit: 2019-06-30 | Discharge: 2019-06-30 | Disposition: A | Discharge status: Home / Self Care | Payer: Medicaid Other | Source: Ambulatory Visit | Attending: Obstetrics and Gynecology | Admitting: Obstetrics and Gynecology

## 2019-06-30 ENCOUNTER — Other Ambulatory Visit: Payer: Self-pay

## 2019-06-30 ENCOUNTER — Ambulatory Visit (INDEPENDENT_AMBULATORY_CARE_PROVIDER_SITE_OTHER): Payer: Medicaid Other | Admitting: Obstetrics & Gynecology

## 2019-06-30 DIAGNOSIS — Z1389 Encounter for screening for other disorder: Secondary | ICD-10-CM | POA: Diagnosis not present

## 2019-06-30 DIAGNOSIS — N870 Mild cervical dysplasia: Secondary | ICD-10-CM

## 2019-06-30 DIAGNOSIS — R87612 Low grade squamous intraepithelial lesion on cytologic smear of cervix (LGSIL): Secondary | ICD-10-CM

## 2019-06-30 NOTE — Patient Instructions (Signed)
Colposcopy, Care After This sheet gives you information about how to care for yourself after your procedure. Your doctor may also give you more specific instructions. If you have problems or questions, contact your doctor. What can I expect after the procedure? If you did not have a tissue sample removed (did not have a biopsy), you may only have some spotting for a few days. You can go back to your normal activities. If you had a tissue sample removed, it is common to have:  Soreness and pain. This may last for a few days.  Light-headedness.  Mild bleeding from your vagina or dark-colored, grainy discharge from your vagina. This may last for a few days. You may need to wear a sanitary pad.  Spotting for at least 48 hours after the procedure. Follow these instructions at home:   Take over-the-counter and prescription medicines only as told by your doctor. Ask your doctor what medicines you can start taking again. This is very important if you take blood-thinning medicine.  Do not drive or use heavy machinery while taking prescription pain medicine.  For 3 days, or as long as your doctor tells you, avoid: ? Douching. ? Using tampons. ? Having sex.  If you use birth control (contraception), keep using it.  Limit activity for the first day after the procedure. Ask your doctor what activities are safe for you.  It is up to you to get the results of your procedure. Ask your doctor when your results will be ready.  Keep all follow-up visits as told by your doctor. This is important. Contact a doctor if:  You get a skin rash. Get help right away if:  You are bleeding a lot from your vagina. It is a lot of bleeding if you are using more than one pad an hour for 2 hours in a row.  You have clumps of blood (blood clots) coming from your vagina.  You have a fever.  You have chills  You have pain in your lower belly (pelvic area).  You have signs of infection, such as vaginal  discharge that is: ? Different than usual. ? Yellow. ? Bad-smelling.  You have very pain or cramps in your lower belly that do not get better with medicine.  You feel light-headed.  You feel dizzy.  You pass out (faint). Summary  If you did not have a tissue sample removed (did not have a biopsy), you may only have some spotting for a few days. You can go back to your normal activities.  If you had a tissue sample removed, it is common to have mild pain and spotting for 48 hours.  For 3 days, or as long as your doctor tells you, avoid douching, using tampons and having sex.  Get help right away if you have bleeding, very bad pain, or signs of infection. This information is not intended to replace advice given to you by your health care provider. Make sure you discuss any questions you have with your health care provider. Document Revised: 04/11/2017 Document Reviewed: 01/17/2016 Elsevier Patient Education  2020 Elsevier Inc.  

## 2019-06-30 NOTE — Progress Notes (Signed)
Post Partum Exam  Alexis Ellis is a 29 y.o. [id number] female who presents for a postpartum visit. She is 7 weeks postpartum following a spontaneous vaginal delivery. I have fully reviewed the prenatal and intrapartum course. The delivery was at 33.0 gestational weeks.  Anesthesia: none. Postpartum course has been doing well. Baby's course has been doing well. Baby is feeding by both breast and bottle - Similac Neosure. Bleeding no bleeding. Bowel function is normal. Bladder function is normal. Patient is sexually active. Contraception method is condoms. Postpartum depression screening:neg  The following portions of the patient's history were reviewed and updated as appropriate: allergies, current medications, past family history, past medical history, past social history, past surgical history and problem list. Last pap smear done 11/2018 and was Abnormal- LSIL  Review of Systems Pertinent items are noted in HPI.    Objective:  Last menstrual period 09/12/2018, unknown if currently breastfeeding.  General:  alert, cooperative and no distress           Abdomen: soft, non-tender; bowel sounds normal; no masses,  no organomegaly   Vulva:  normal  Vagina: normal vagina  Cervix:  no lesions  Corpus: not examined  Adnexa:  not evaluated  Rectal Exam: Not performed.       See colposcopy Assessment:    normal postpartum exam. Pap smear not done at today's visit.   Plan:   1. Contraception: condoms 2. Spermacide  3. Follow up in: 12 months for pap or as needed.  164/99

## 2019-06-30 NOTE — Progress Notes (Signed)
Patient ID: Alexis Ellis, female   DOB: November 08, 1990, 29 y.o.   MRN: [id number]  Chief Complaint  Patient presents with  . Postpartum Care  . Colposcopy    HPI Alexis Ellis is a 29 y.o. female.  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 Previous colposcopy: normal exam without visible pathology. Prior cervical treatment: no treatment.  Past Medical History:  Diagnosis Date  . ADD (attention deficit disorder)   . Chicken pox   . Depression   . Diet controlled gestational diabetes mellitus (GDM) in third trimester 03/23/2019  . Ectopic pregnancy   . Elevated blood pressure   . Elevated cholesterol   . Frequent headaches   . Gestational hypertension 04/19/2019  . History of kidney stones   . Pregnancy induced hypertension     Past Surgical History:  Procedure Laterality Date  . LAPAROSCOPY N/A 09/16/2012   Procedure: LAPAROSCOPY OPERATIVE;  Surgeon: Allena Katz, MD;  Location: [facility];  Service: Gynecology;  Laterality: N/A;  Evacuation Hemoperitoneum  . UNILATERAL SALPINGECTOMY Right 09/16/2012   Procedure: UNILATERAL SALPINGECTOMY;  Surgeon: Allena Katz, MD;  Location: [facility];  Service: Gynecology;  Laterality: Right;  . WISDOM TOOTH EXTRACTION      Family History  Problem Relation Age of Onset  . Hypertension Mother        Living  . Fibromyalgia Mother   . Diabetes Father 26       Deceased  . Heart attack Father   . Sudden death Father   . Bipolar disorder Maternal Grandmother   . Diabetes Maternal Grandfather   . Alcohol abuse Paternal Grandfather   . Liver disease Paternal Grandfather   . Hyperlipidemia Paternal Grandmother   . Kidney disease Paternal Grandmother   . Liver disease Paternal Grandmother   . Heart disease Paternal Grandmother   . Healthy Sister     Social History Social History   Tobacco Use  . Smoking status: Former  Smoker    Packs/day: 0.00  . Smokeless tobacco: Never Used  Substance Use Topics  . Alcohol use: Not Currently    Alcohol/week: 0.0 standard drinks    Comment: social  . Drug use: No    No Known Allergies  Current Outpatient Medications  Medication Sig Dispense Refill  . predniSONE (DELTASONE) 1 MG tablet Take 1 mg by mouth daily with breakfast.    . acetaminophen (TYLENOL) 325 MG tablet Take 650 mg by mouth every 6 (six) hours as needed.    Marland Kitchen amLODipine (NORVASC) 5 MG tablet Take 1 tablet (5 mg total) by mouth daily. 30 tablet 1  . Blood Pressure Monitor KIT 1 Device by Does not apply route once a week. To be monitored Regularly at home. 1 kit 0  . ibuprofen (ADVIL) 600 MG tablet Take 1 tablet (600 mg total) by mouth every 6 (six) hours. 30 tablet 0  . Prenatal MV-Min-FA-Omega-3 (PRENATAL GUMMIES/DHA & FA) 0.4-32.5 MG CHEW Chew by mouth.     No current facility-administered medications for this visit.    Review of Systems Review of Systems  Blood pressure (!) 164/99, pulse 83, weight 234 lb (106.1 kg), last menstrual period 09/12/2018, unknown if currently breastfeeding.  Physical Exam Physical Exam  Data Reviewed Pap result  Assessment      Speculum placed in vagina and excellent visualization of cervix achieved, cervix swabbed x 3 with acetic acid solution. Patient  given informed consent, signed copy in the chart, time out was performed.  Placed in lithotomy position. Cervix viewed with speculum and colposcope after application of acetic acid.   Colposcopy adequate?  yes Acetowhite lesions? Mild ant and post Punctation? no Mosaicism?  no Abnormal vasculature?  no Biopsies? yes ECC? yes  .   Specimens: ECC, BX at 1,7 and 11  Complications: none.     Plan    Specimens labelled and sent to Pathology. Call to discuss Pathology results in 2 weeks.      Emeterio Reeve 06/30/2019, 3:22 PM

## 2019-07-02 LAB — SURGICAL PATHOLOGY

## 2019-07-02 NOTE — Progress Notes (Signed)
LGSIL, repeat pap in 12 montha

## 2019-07-20 DIAGNOSIS — M654 Radial styloid tenosynovitis [de Quervain]: Secondary | ICD-10-CM | POA: Diagnosis not present

## 2019-07-26 DIAGNOSIS — H40033 Anatomical narrow angle, bilateral: Secondary | ICD-10-CM | POA: Diagnosis not present

## 2019-07-26 DIAGNOSIS — H16223 Keratoconjunctivitis sicca, not specified as Sjogren's, bilateral: Secondary | ICD-10-CM | POA: Diagnosis not present

## 2019-08-14 DIAGNOSIS — H1013 Acute atopic conjunctivitis, bilateral: Secondary | ICD-10-CM | POA: Diagnosis not present

## 2019-08-15 DIAGNOSIS — H5213 Myopia, bilateral: Secondary | ICD-10-CM | POA: Diagnosis not present

## 2019-08-21 DIAGNOSIS — H1013 Acute atopic conjunctivitis, bilateral: Secondary | ICD-10-CM | POA: Diagnosis not present

## 2019-08-21 DIAGNOSIS — H5213 Myopia, bilateral: Secondary | ICD-10-CM | POA: Diagnosis not present

## 2019-11-03 DIAGNOSIS — H04123 Dry eye syndrome of bilateral lacrimal glands: Secondary | ICD-10-CM | POA: Diagnosis not present

## 2019-12-20 ENCOUNTER — Telehealth: Payer: Self-pay | Admitting: Internal Medicine

## 2019-12-20 NOTE — Telephone Encounter (Signed)
Patient called. She hasn't been seen by [facility] in over 3 years.  She'd like to get re-established with [facility]. Patient has Medicaid Healthy Blue.  Can patient re-establish?

## 2019-12-21 NOTE — Telephone Encounter (Signed)
[facility] for next available new slot

## 2019-12-21 NOTE — Telephone Encounter (Signed)
I left a message on patient's voice mail to call back and schedule the appointment.

## 2020-02-15 ENCOUNTER — Ambulatory Visit: Payer: Medicaid Other | Admitting: Internal Medicine

## 2020-02-15 NOTE — Progress Notes (Deleted)
HPI  Pt presents to the clinic today to reestablish care. She has not had a PCP in a few years.  ADD:  HLD:  HTN:  Flu: Tetanus: 03/2019 Covid: Pap Smear: 11/2018 Dentist:  Past Medical History:  Diagnosis Date  . ADD (attention deficit disorder)   . Chicken pox   . Depression   . Diet controlled gestational diabetes mellitus (GDM) in third trimester 03/23/2019  . Ectopic pregnancy   . Elevated blood pressure   . Elevated cholesterol   . Frequent headaches   . Gestational hypertension 04/19/2019  . History of kidney stones   . Pregnancy induced hypertension     Current Outpatient Medications  Medication Sig Dispense Refill  . acetaminophen (TYLENOL) 325 MG tablet Take 650 mg by mouth every 6 (six) hours as needed.    Marland Kitchen amLODipine (NORVASC) 5 MG tablet Take 1 tablet (5 mg total) by mouth daily. 30 tablet 1  . Blood Pressure Monitor KIT 1 Device by Does not apply route once a week. To be monitored Regularly at home. 1 kit 0  . ibuprofen (ADVIL) 600 MG tablet Take 1 tablet (600 mg total) by mouth every 6 (six) hours. 30 tablet 0  . predniSONE (DELTASONE) 1 MG tablet Take 1 mg by mouth daily with breakfast.    . Prenatal MV-Min-FA-Omega-3 (PRENATAL GUMMIES/DHA & FA) 0.4-32.5 MG CHEW Chew by mouth.     No current facility-administered medications for this visit.    No Known Allergies  Family History  Problem Relation Age of Onset  . Hypertension Mother        Living  . Fibromyalgia Mother   . Diabetes Father 61       Deceased  . Heart attack Father   . Sudden death Father   . Bipolar disorder Maternal Grandmother   . Diabetes Maternal Grandfather   . Alcohol abuse Paternal Grandfather   . Liver disease Paternal Grandfather   . Hyperlipidemia Paternal Grandmother   . Kidney disease Paternal Grandmother   . Liver disease Paternal Grandmother   . Heart disease Paternal Grandmother   . Healthy Sister     Social History   Socioeconomic History  . Marital  status: Single    Spouse name: Not on file  . Number of children: Not on file  . Years of education: Not on file  . Highest education level: Not on file  Occupational History  . Not on file  Tobacco Use  . Smoking status: Former Smoker    Packs/day: 0.00  . Smokeless tobacco: Never Used  Vaping Use  . Vaping Use: Never used  Substance and Sexual Activity  . Alcohol use: Not Currently    Alcohol/week: 0.0 standard drinks    Comment: social  . Drug use: No  . Sexual activity: Yes    Partners: Male    Birth control/protection: None  Other Topics Concern  . Not on file  Social History Narrative  . Not on file   Social Determinants of Health   Financial Resource Strain:   . Difficulty of Paying Living Expenses: Not on file  Food Insecurity:   . Worried About Charity fundraiser in the Last Year: Not on file  . Ran Out of Food in the Last Year: Not on file  Transportation Needs:   . Lack of Transportation (Medical): Not on file  . Lack of Transportation (Non-Medical): Not on file  Physical Activity:   . Days of Exercise per Week: Not on file  .  Minutes of Exercise per Session: Not on file  Stress:   . Feeling of Stress : Not on file  Social Connections:   . Frequency of Communication with Friends and Family: Not on file  . Frequency of Social Gatherings with Friends and Family: Not on file  . Attends Religious Services: Not on file  . Active Member of Clubs or Organizations: Not on file  . Attends Banker Meetings: Not on file  . Marital Status: Not on file  Intimate Partner Violence:   . Fear of Current or Ex-Partner: Not on file  . Emotionally Abused: Not on file  . Physically Abused: Not on file  . Sexually Abused: Not on file    ROS:  Constitutional: Denies fever, malaise, fatigue, headache or abrupt weight changes.  HEENT: Denies eye pain, eye redness, ear pain, ringing in the ears, wax buildup, runny nose, nasal congestion, bloody nose, or sore  throat. Respiratory: Denies difficulty breathing, shortness of breath, cough or sputum production.   Cardiovascular: Denies chest pain, chest tightness, palpitations or swelling in the hands or feet.  Gastrointestinal: Denies abdominal pain, bloating, constipation, diarrhea or blood in the stool.  GU: Denies frequency, urgency, pain with urination, blood in urine, odor or discharge. Musculoskeletal: Denies decrease in range of motion, difficulty with gait, muscle pain or joint pain and swelling.  Skin: Denies redness, rashes, lesions or ulcercations.  Neurological: Denies dizziness, difficulty with memory, difficulty with speech or problems with balance and coordination.  Psych: Denies anxiety, depression, SI/HI.  No other specific complaints in a complete review of systems (except as listed in HPI above).  PE:  There were no vitals taken for this visit. Wt Readings from Last 3 Encounters:  06/30/19 234 lb (106.1 kg)  04/30/19 265 lb (120.2 kg)  04/29/19 265 lb 1.6 oz (120.2 kg)    General: Appears their stated age, well developed, well nourished in NAD. HEENT: Head: normal shape and size; Eyes: sclera white, no icterus, conjunctiva pink, PERRLA and EOMs intact; Ears: Tm's gray and intact, normal light reflex;Throat/Mouth: Teeth present, mucosa pink and moist, no lesions or ulcerations noted.  Neck: Neck supple, trachea midline. No masses, lumps or thyromegaly present.  Cardiovascular: Normal rate and rhythm. S1,S2 noted.  No murmur, rubs or gallops noted. No JVD or BLE edema. No carotid bruits noted. Pulmonary/Chest: Normal effort and positive vesicular breath sounds. No respiratory distress. No wheezes, rales or ronchi noted.  Abdomen: Soft and nontender. Normal bowel sounds, no bruits noted. No distention or masses noted. Liver, spleen and kidneys non palpable. Musculoskeletal: Normal range of motion. Strength 5/5 BUE/BLE. No signs of joint swelling. No difficulty with gait.   Neurological: Alert and oriented. Cranial nerves II-XII grossly intact. Coordination normal.  Psychiatric: Mood and affect normal. Behavior is normal. Judgment and thought content normal.   EKG:  BMET    Component Value Date/Time   NA 134 (L) 05/01/2019 1723   K 4.2 05/01/2019 1723   CL 102 05/01/2019 1723   CO2 22 05/01/2019 1723   GLUCOSE 149 (H) 05/01/2019 1723   BUN 9 05/01/2019 1723   CREATININE 0.77 05/01/2019 1723   CREATININE 1.00 06/05/2015 2228   CALCIUM 7.4 (L) 05/01/2019 1723   GFRNONAA >60 05/01/2019 1723   GFRAA >60 05/01/2019 1723    Lipid Panel     Component Value Date/Time   CHOL 224 (H) 11/01/2013 1051   TRIG 141 11/01/2013 1051   HDL 32 (L) 11/01/2013 1051   CHOLHDL  7.0 11/01/2013 1051   VLDL 28 11/01/2013 1051   LDLCALC 164 (H) 11/01/2013 1051    CBC    Component Value Date/Time   WBC 15.1 (H) 05/01/2019 1723   RBC 4.44 05/01/2019 1723   HGB 12.5 05/01/2019 1723   HGB 12.7 03/22/2019 0930   HCT 37.9 05/01/2019 1723   HCT 37.7 03/22/2019 0930   PLT 282 05/01/2019 1723   PLT 280 03/22/2019 0930   MCV 85.4 05/01/2019 1723   MCV 84 03/22/2019 0930   MCH 28.2 05/01/2019 1723   MCHC 33.0 05/01/2019 1723   RDW 13.4 05/01/2019 1723   RDW 12.8 03/22/2019 0930   LYMPHSABS 2.3 11/30/2018 0955   MONOABS 0.6 11/01/2013 1051   EOSABS 0.1 11/30/2018 0955   BASOSABS 0.0 11/30/2018 0955    Hgb A1C Lab Results  Component Value Date   HGBA1C 5.1 11/01/2013     Assessment and Plan:     Webb Silversmith, NP This visit occurred during the SARS-CoV-2 public health emergency.  Safety protocols were in place, including screening questions prior to the visit, additional usage of staff PPE, and extensive cleaning of exam room while observing appropriate contact time as indicated for disinfecting solutions.

## 2020-02-24 DIAGNOSIS — M2242 Chondromalacia patellae, left knee: Secondary | ICD-10-CM | POA: Diagnosis not present

## 2020-02-24 DIAGNOSIS — M654 Radial styloid tenosynovitis [de Quervain]: Secondary | ICD-10-CM | POA: Diagnosis not present

## 2020-03-15 DIAGNOSIS — J0100 Acute maxillary sinusitis, unspecified: Secondary | ICD-10-CM | POA: Diagnosis not present

## 2020-03-27 ENCOUNTER — Other Ambulatory Visit: Payer: Self-pay

## 2020-03-27 ENCOUNTER — Ambulatory Visit (INDEPENDENT_AMBULATORY_CARE_PROVIDER_SITE_OTHER): Payer: Medicaid Other | Admitting: Internal Medicine

## 2020-03-27 ENCOUNTER — Encounter: Payer: Self-pay | Admitting: Internal Medicine

## 2020-03-27 VITALS — BP 152/102 | HR 107 | Temp 97.0°F | Ht 65.0 in | Wt 233.0 lb

## 2020-03-27 DIAGNOSIS — F324 Major depressive disorder, single episode, in partial remission: Secondary | ICD-10-CM

## 2020-03-27 DIAGNOSIS — F988 Other specified behavioral and emotional disorders with onset usually occurring in childhood and adolescence: Secondary | ICD-10-CM

## 2020-03-27 DIAGNOSIS — I10 Essential (primary) hypertension: Secondary | ICD-10-CM

## 2020-03-27 DIAGNOSIS — E669 Obesity, unspecified: Secondary | ICD-10-CM | POA: Diagnosis not present

## 2020-03-27 DIAGNOSIS — E7800 Pure hypercholesterolemia, unspecified: Secondary | ICD-10-CM

## 2020-03-27 LAB — LIPID PANEL
Cholesterol: 234 mg/dL — ABNORMAL HIGH (ref 0–200)
HDL: 43.2 mg/dL (ref >39.00)
LDL Cholesterol: 164 mg/dL — ABNORMAL HIGH (ref 0–99)
NonHDL: 190.8
Total CHOL/HDL Ratio: 5
Triglycerides: 134 mg/dL (ref 0.0–149.0)
VLDL: 26.8 mg/dL (ref 0.0–40.0)

## 2020-03-27 LAB — COMPREHENSIVE METABOLIC PANEL
ALT: 42 U/L — ABNORMAL HIGH (ref 0–35)
AST: 20 U/L (ref 0–37)
Albumin: 4.6 g/dL (ref 3.5–5.2)
Alkaline Phosphatase: 84 U/L (ref 39–117)
BUN: 12 mg/dL (ref 6–23)
CO2: 30 mEq/L (ref 19–32)
Calcium: 9.8 mg/dL (ref 8.4–10.5)
Chloride: 101 mEq/L (ref 96–112)
Creatinine, Ser: 0.82 mg/dL (ref 0.40–1.20)
GFR: 95.38 mL/min (ref >60.00)
Glucose, Bld: 79 mg/dL (ref 70–99)
Potassium: 4.1 mEq/L (ref 3.5–5.1)
Sodium: 139 mEq/L (ref 135–145)
Total Bilirubin: 0.7 mg/dL (ref 0.2–1.2)
Total Protein: 7.1 g/dL (ref 6.0–8.3)

## 2020-03-27 LAB — TSH: TSH: 1.25 u[IU]/mL (ref 0.35–4.50)

## 2020-03-27 LAB — HEMOGLOBIN A1C: Hgb A1c MFr Bld: 5.3 % (ref 4.6–6.5)

## 2020-03-27 MED ORDER — AMLODIPINE BESYLATE 5 MG PO TABS: 5.0000 mg | ORAL_TABLET | Freq: Every day | ORAL | 0 refills | Status: DC

## 2020-03-27 NOTE — Assessment & Plan Note (Signed)
Continue Adderall prescribed by psych

## 2020-03-27 NOTE — Assessment & Plan Note (Signed)
She is interested in sleeve gastrectomy Advised her to go to the [employer] and do the free Webinar Discussed calorie restricted diet, avoiding carbs and processed foods, not drinking our calories and consuming 64 oz of water daily

## 2020-03-27 NOTE — Assessment & Plan Note (Signed)
Uncontrolled CMET today Reinforced DASH diet and exercise for weight loss Amlodipine refilled today  RTC in 2 weeks for BP check

## 2020-03-27 NOTE — Patient Instructions (Signed)

## 2020-03-27 NOTE — Progress Notes (Signed)
HPI  Pt presents to the clinic today to reestablish care and for management of the conditions listed below.   ADD: She is currently managed on Adderall. She follows with Dr. Kaur.  Depression: Currently not a major issue. She is not taking any medications for this. She is prescribed Clonazepam but reports she rarely takes this. She is not seeing a therapist. She denies anxiety, SI/HI.  HLD: She is not taking any cholesterol lowering medication at this time. She does not consume a low fat diet.  Flu: never Tetanus: 03/2019 Covid: never Pap Smear: 11/2018 Dentist: as needed  Past Medical History:  Diagnosis Date  . ADD (attention deficit disorder)   . Chicken pox   . Depression   . Diet controlled gestational diabetes mellitus (GDM) in third trimester 03/23/2019  . Ectopic pregnancy   . Elevated blood pressure   . Elevated cholesterol   . Frequent headaches   . Gestational hypertension 04/19/2019  . History of kidney stones   . Pregnancy induced hypertension     Current Outpatient Medications  Medication Sig Dispense Refill  . acetaminophen (TYLENOL) 325 MG tablet Take 650 mg by mouth every 6 (six) hours as needed.    . amLODipine (NORVASC) 5 MG tablet Take 1 tablet (5 mg total) by mouth daily. 30 tablet 1  . Blood Pressure Monitor KIT 1 Device by Does not apply route once a week. To be monitored Regularly at home. 1 kit 0  . ibuprofen (ADVIL) 600 MG tablet Take 1 tablet (600 mg total) by mouth every 6 (six) hours. 30 tablet 0  . predniSONE (DELTASONE) 1 MG tablet Take 1 mg by mouth daily with breakfast.    . Prenatal MV-Min-FA-Omega-3 (PRENATAL GUMMIES/DHA & FA) 0.4-32.5 MG CHEW Chew by mouth.     No current facility-administered medications for this visit.    No Known Allergies  Family History  Problem Relation Age of Onset  . Hypertension Mother        Living  . Fibromyalgia Mother   . Diabetes Father 49       Deceased  . Heart attack Father   . Sudden death Father    . Bipolar disorder Maternal Grandmother   . Diabetes Maternal Grandfather   . Alcohol abuse Paternal Grandfather   . Liver disease Paternal Grandfather   . Hyperlipidemia Paternal Grandmother   . Kidney disease Paternal Grandmother   . Liver disease Paternal Grandmother   . Heart disease Paternal Grandmother   . Healthy Sister     Social History   Socioeconomic History  . Marital status: Single    Spouse name: Not on file  . Number of children: Not on file  . Years of education: Not on file  . Highest education level: Not on file  Occupational History  . Not on file  Tobacco Use  . Smoking status: Former Smoker    Packs/day: 0.00  . Smokeless tobacco: Never Used  Vaping Use  . Vaping Use: Never used  Substance and Sexual Activity  . Alcohol use: Not Currently    Alcohol/week: 0.0 standard drinks    Comment: social  . Drug use: No  . Sexual activity: Yes    Partners: Male    Birth control/protection: None  Other Topics Concern  . Not on file  Social History Narrative  . Not on file   Social Determinants of Health   Financial Resource Strain:   . Difficulty of Paying Living Expenses: Not on file  Food   Insecurity:   . Worried About Running Out of Food in the Last Year: Not on file  . Ran Out of Food in the Last Year: Not on file  Transportation Needs:   . Lack of Transportation (Medical): Not on file  . Lack of Transportation (Non-Medical): Not on file  Physical Activity:   . Days of Exercise per Week: Not on file  . Minutes of Exercise per Session: Not on file  Stress:   . Feeling of Stress : Not on file  Social Connections:   . Frequency of Communication with Friends and Family: Not on file  . Frequency of Social Gatherings with Friends and Family: Not on file  . Attends Religious Services: Not on file  . Active Member of Clubs or Organizations: Not on file  . Attends Club or Organization Meetings: Not on file  . Marital Status: Not on file  Intimate  Partner Violence:   . Fear of Current or Ex-Partner: Not on file  . Emotionally Abused: Not on file  . Physically Abused: Not on file  . Sexually Abused: Not on file    ROS:  Constitutional: Pt reports abnormal weight gain, intermittent headaches. Denies fever, malaise, fatigue, headache or abrupt weight changes.  HEENT: Denies eye pain, eye redness, ear pain, ringing in the ears, wax buildup, runny nose, nasal congestion, bloody nose, or sore throat. Respiratory: Denies difficulty breathing, shortness of breath, cough or sputum production.   Cardiovascular: Denies chest pain, chest tightness, palpitations or swelling in the hands or feet.  Gastrointestinal: Denies abdominal pain, bloating, constipation, diarrhea or blood in the stool.  GU: Denies frequency, urgency, pain with urination, blood in urine, odor or discharge. Musculoskeletal: Denies decrease in range of motion, difficulty with gait, muscle pain or joint pain and swelling.  Skin: Denies redness, rashes, lesions or ulcercations.  Neurological: Pt reports inattention, intermittent lightheadedness. Denies dizziness, difficulty with memory, difficulty with speech or problems with balance and coordination.  Psych: Pt has a history of depression. Denies anxiety, SI/HI.  No other specific complaints in a complete review of systems (except as listed in HPI above).  PE:  BP (!) 152/102   Pulse (!) 107   Temp (!) 97 F (36.1 C) (Temporal)   Ht 5' 5" (1.651 m)   Wt 233 lb (105.7 kg)   LMP 03/17/2020   SpO2 98%   BMI 38.77 kg/m   Wt Readings from Last 3 Encounters:  06/30/19 234 lb (106.1 kg)  04/30/19 265 lb (120.2 kg)  04/29/19 265 lb 1.6 oz (120.2 kg)    General: Appears her stated age, obese, in NAD. Skin: Dry and intact. HEENT: EOM's intact. Cardiovascular: Normal rate. Pulmonary/Chest: Normal effort. Musculoskeletal:  No difficulty with gait.  Neurological: Alert and oriented.   Psychiatric: Mildly tearful.  Behavior is normal. Judgment and thought content normal.     BMET    Component Value Date/Time   NA 134 (L) 05/01/2019 1723   K 4.2 05/01/2019 1723   CL 102 05/01/2019 1723   CO2 22 05/01/2019 1723   GLUCOSE 149 (H) 05/01/2019 1723   BUN 9 05/01/2019 1723   CREATININE 0.77 05/01/2019 1723   CREATININE 1.00 06/05/2015 2228   CALCIUM 7.4 (L) 05/01/2019 1723   GFRNONAA >60 05/01/2019 1723   GFRAA >60 05/01/2019 1723    Lipid Panel     Component Value Date/Time   CHOL 224 (H) 11/01/2013 1051   TRIG 141 11/01/2013 1051   HDL 32 (L) 11/01/2013 1051     CHOLHDL 7.0 11/01/2013 1051   VLDL 28 11/01/2013 1051   LDLCALC 164 (H) 11/01/2013 1051    CBC    Component Value Date/Time   WBC 15.1 (H) 05/01/2019 1723   RBC 4.44 05/01/2019 1723   HGB 12.5 05/01/2019 1723   HGB 12.7 03/22/2019 0930   HCT 37.9 05/01/2019 1723   HCT 37.7 03/22/2019 0930   PLT 282 05/01/2019 1723   PLT 280 03/22/2019 0930   MCV 85.4 05/01/2019 1723   MCV 84 03/22/2019 0930   MCH 28.2 05/01/2019 1723   MCHC 33.0 05/01/2019 1723   RDW 13.4 05/01/2019 1723   RDW 12.8 03/22/2019 0930   LYMPHSABS 2.3 11/30/2018 0955   MONOABS 0.6 11/01/2013 1051   EOSABS 0.1 11/30/2018 0955   BASOSABS 0.0 11/30/2018 0955    Hgb A1C Lab Results  Component Value Date   HGBA1C 5.1 11/01/2013     Assessment and Plan:   Webb Silversmith, NP This visit occurred during the SARS-CoV-2 public health emergency.  Safety protocols were in place, including screening questions prior to the visit, additional usage of staff PPE, and extensive cleaning of exam room while observing appropriate contact time as indicated for disinfecting solutions.

## 2020-03-27 NOTE — Assessment & Plan Note (Signed)
Continue Clonazepam, prescribed by psychiatry Support offered

## 2020-03-27 NOTE — Assessment & Plan Note (Signed)
CMET and Lipid profile today Encouraged her to consume a low fat diet 

## 2020-03-29 ENCOUNTER — Encounter: Payer: Self-pay | Admitting: Internal Medicine

## 2020-03-29 DIAGNOSIS — E7800 Pure hypercholesterolemia, unspecified: Secondary | ICD-10-CM

## 2020-04-12 ENCOUNTER — Other Ambulatory Visit: Payer: Self-pay

## 2020-04-12 ENCOUNTER — Encounter: Payer: Self-pay | Admitting: Internal Medicine

## 2020-04-12 ENCOUNTER — Ambulatory Visit: Payer: Medicaid Other | Admitting: Internal Medicine

## 2020-04-12 VITALS — BP 136/96 | HR 102 | Temp 97.9°F | Wt 236.0 lb

## 2020-04-12 DIAGNOSIS — R519 Headache, unspecified: Secondary | ICD-10-CM

## 2020-04-12 DIAGNOSIS — I10 Essential (primary) hypertension: Secondary | ICD-10-CM | POA: Diagnosis not present

## 2020-04-12 NOTE — Progress Notes (Signed)
Subjective:    Patient ID: Alexis Ellis, female    DOB: [birth year], 29 y.o.   MRN: [id number]  HPI  Pt presents to the clinic today for 2 week follow up of HTN. At her last visit, she was restarted on Amlodipine. She has been taking the medication as prescribed. She reports swelling in her ankles that is tight and tender but not necessarily painful. She has some associated dizziness, which did not start until she started the BP medication. Her BP today is 136/96 but she has been getting higher readings at home.  She also reports daily headaches. These are located in her forehead and the back of her head. She describes the pain as sharp. She denies visual changes, sensitivity to light, sound, nausea, vomiting or neck pain. She reports she has been diagnosed with cluster headaches in the past,used to take Oxygen with good relief of symptoms. She reports these headaches will last 10-30 minutes and usually resolve without intervention.   Review of Systems      Past Medical History:  Diagnosis Date  . ADD (attention deficit disorder)   . Chicken pox   . Depression   . Diet controlled gestational diabetes mellitus (GDM) in third trimester 03/23/2019  . Ectopic pregnancy   . Elevated blood pressure   . Elevated cholesterol   . Frequent headaches   . Gestational hypertension 04/19/2019  . History of kidney stones   . Pregnancy induced hypertension     Current Outpatient Medications  Medication Sig Dispense Refill  . amLODipine (NORVASC) 5 MG tablet Take 1 tablet (5 mg total) by mouth daily. 30 tablet 0  . amphetamine-dextroamphetamine (ADDERALL) 20 MG tablet Take 1 tablet by mouth 4 (four) times daily.    . clonazePAM (KLONOPIN) 1 MG tablet Take 1 mg by mouth 2 (two) times daily as needed.     No current facility-administered medications for this visit.    No Known Allergies  Family History  Problem Relation Age of Onset  . Hypertension Mother        Living  . Fibromyalgia  Mother   . Diabetes Father 42       Deceased  . Heart attack Father   . Sudden death Father   . Bipolar disorder Maternal Grandmother   . Diabetes Maternal Grandfather   . Alcohol abuse Paternal Grandfather   . Liver disease Paternal Grandfather   . Hyperlipidemia Paternal Grandmother   . Kidney disease Paternal Grandmother   . Liver disease Paternal Grandmother   . Heart disease Paternal Grandmother   . Healthy Sister     Social History   Socioeconomic History  . Marital status: Single    Spouse name: Not on file  . Number of children: Not on file  . Years of education: Not on file  . Highest education level: Not on file  Occupational History  . Not on file  Tobacco Use  . Smoking status: Former Smoker    Packs/day: 0.00  . Smokeless tobacco: Never Used  Vaping Use  . Vaping Use: Never used  Substance and Sexual Activity  . Alcohol use: Not Currently    Alcohol/week: 0.0 standard drinks    Comment: social  . Drug use: No  . Sexual activity: Yes    Partners: Male    Birth control/protection: None  Other Topics Concern  . Not on file  Social History Narrative  . Not on file   Social Determinants of Health   Financial Resource  Strain:   . Difficulty of Paying Living Expenses: Not on file  Food Insecurity:   . Worried About Charity fundraiser in the Last Year: Not on file  . Ran Out of Food in the Last Year: Not on file  Transportation Needs:   . Lack of Transportation (Medical): Not on file  . Lack of Transportation (Non-Medical): Not on file  Physical Activity:   . Days of Exercise per Week: Not on file  . Minutes of Exercise per Session: Not on file  Stress:   . Feeling of Stress : Not on file  Social Connections:   . Frequency of Communication with Friends and Family: Not on file  . Frequency of Social Gatherings with Friends and Family: Not on file  . Attends Religious Services: Not on file  . Active Member of Clubs or Organizations: Not on file  .  Attends Archivist Meetings: Not on file  . Marital Status: Not on file  Intimate Partner Violence:   . Fear of Current or Ex-Partner: Not on file  . Emotionally Abused: Not on file  . Physically Abused: Not on file  . Sexually Abused: Not on file     Constitutional: Pt reports headaches. Denies fever, malaise, fatigue, or abrupt weight changes.  HEENT: Denies eye pain, eye redness, ear pain, ringing in the ears, wax buildup, runny nose, nasal congestion, bloody nose, or sore throat. Respiratory: Denies difficulty breathing, shortness of breath, cough or sputum production.   Cardiovascular: Pt reports swelling in feet. Denies chest pain, chest tightness, palpitations or swelling in the hands.  Musculoskeletal: Denies decrease in range of motion, difficulty with gait, muscle pain or joint pain and swelling.  Neurological: Pt reports dizziness. Denies difficulty with memory, difficulty with speech or problems with balance and coordination.  Psych: Pt has a history of anxiety. Denies depression, SI/HI.  No other specific complaints in a complete review of systems (except as listed in HPI above).  Objective:   Physical Exam  BP (!) 136/96   Pulse (!) 102   Temp 97.9 F (36.6 C) (Temporal)   Wt 236 lb (107 kg)   LMP 03/17/2020   SpO2 98%   BMI 39.27 kg/m   Wt Readings from Last 3 Encounters:  03/27/20 233 lb (105.7 kg)  06/30/19 234 lb (106.1 kg)  04/30/19 265 lb (120.2 kg)    General: Appears her stated age, obese, in NAD. HEENT: Head: normal shape and size; Eyes: EOMs intact; Cardiovascular: Tachycardic with normal rhythm. S1,S2 noted.  No murmur, rubs or gallops noted. No JVD or BLE edema.  Pulmonary/Chest: Normal effort and positive vesicular breath sounds. No respiratory distress. No wheezes, rales or ronchi noted.  Musculoskeletal: No difficulty with gait.  Neurological: Alert and oriented. Coordination normal.    BMET    Component Value Date/Time   NA 139  03/27/2020 1227   K 4.1 03/27/2020 1227   CL 101 03/27/2020 1227   CO2 30 03/27/2020 1227   GLUCOSE 79 03/27/2020 1227   BUN 12 03/27/2020 1227   CREATININE 0.82 03/27/2020 1227   CREATININE 1.00 06/05/2015 2228   CALCIUM 9.8 03/27/2020 1227   GFRNONAA >60 05/01/2019 1723   GFRAA >60 05/01/2019 1723    Lipid Panel     Component Value Date/Time   CHOL 234 (H) 03/27/2020 1227   TRIG 134.0 03/27/2020 1227   HDL 43.20 03/27/2020 1227   CHOLHDL 5 03/27/2020 1227   VLDL 26.8 03/27/2020 1227   [place]  164 (H) 03/27/2020 1227    CBC    Component Value Date/Time   WBC 15.1 (H) 05/01/2019 1723   RBC 4.44 05/01/2019 1723   HGB 12.5 05/01/2019 1723   HGB 12.7 03/22/2019 0930   HCT 37.9 05/01/2019 1723   HCT 37.7 03/22/2019 0930   PLT 282 05/01/2019 1723   PLT 280 03/22/2019 0930   MCV 85.4 05/01/2019 1723   MCV 84 03/22/2019 0930   MCH 28.2 05/01/2019 1723   MCHC 33.0 05/01/2019 1723   RDW 13.4 05/01/2019 1723   RDW 12.8 03/22/2019 0930   LYMPHSABS 2.3 11/30/2018 0955   MONOABS 0.6 11/01/2013 1051   EOSABS 0.1 11/30/2018 0955   BASOSABS 0.0 11/30/2018 0955    Hgb A1C Lab Results  Component Value Date   HGBA1C 5.3 03/27/2020           Assessment & Plan:   Frequent Headaches:  Could consider adding Propranolol, may help with HR control, anxiety and headaches Increase Amlodipine to 10 mg daily Will monitor headaches for now She will let me know if persist or worsens  Update me in 1 week with BP readings. Webb Silversmith, NP This visit occurred during the SARS-CoV-2 public health emergency.  Safety protocols were in place, including screening questions prior to the visit, additional usage of staff PPE, and extensive cleaning of exam room while observing appropriate contact time as indicated for disinfecting solutions.

## 2020-04-13 NOTE — Patient Instructions (Signed)

## 2020-04-13 NOTE — Assessment & Plan Note (Signed)
Improved but still not at goal Increase Amlodipine to 10 mg daily Consider adding Propranolol given elevated HR, frequent headaches Reinforced DASH diet and exercise for weight loss  Update me in 1 week with BP readings

## 2020-04-14 ENCOUNTER — Encounter: Payer: Self-pay | Admitting: Internal Medicine

## 2020-04-14 MED ORDER — LISINOPRIL-HYDROCHLOROTHIAZIDE 10-12.5 MG PO TABS: 1.0000 | ORAL_TABLET | Freq: Every day | ORAL | 0 refills | Status: DC

## 2020-04-19 ENCOUNTER — Other Ambulatory Visit: Payer: Self-pay | Admitting: Internal Medicine

## 2020-05-07 ENCOUNTER — Other Ambulatory Visit: Payer: Self-pay | Admitting: Internal Medicine

## 2020-05-20 DIAGNOSIS — U071 COVID-19: Secondary | ICD-10-CM | POA: Diagnosis not present

## 2020-06-06 ENCOUNTER — Other Ambulatory Visit: Payer: Self-pay

## 2020-06-06 ENCOUNTER — Ambulatory Visit (INDEPENDENT_AMBULATORY_CARE_PROVIDER_SITE_OTHER)
Admission: RE | Admit: 2020-06-06 | Discharge: 2020-06-06 | Disposition: A | Discharge status: Home / Self Care | Payer: Medicaid Other | Source: Ambulatory Visit | Attending: Internal Medicine | Admitting: Internal Medicine

## 2020-06-06 ENCOUNTER — Encounter: Payer: Self-pay | Admitting: Internal Medicine

## 2020-06-06 ENCOUNTER — Ambulatory Visit: Payer: Medicaid Other | Admitting: Internal Medicine

## 2020-06-06 VITALS — BP 128/86 | HR 100 | Temp 97.2°F | Wt 238.0 lb

## 2020-06-06 DIAGNOSIS — R768 Other specified abnormal immunological findings in serum: Secondary | ICD-10-CM

## 2020-06-06 DIAGNOSIS — M2550 Pain in unspecified joint: Secondary | ICD-10-CM

## 2020-06-06 DIAGNOSIS — M25552 Pain in left hip: Secondary | ICD-10-CM

## 2020-06-06 MED ORDER — PREDNISONE 10 MG PO TABS: ORAL_TABLET | ORAL | 0 refills | Status: DC

## 2020-06-06 NOTE — Patient Instructions (Signed)
Hip Bursitis  Hip bursitis is swelling of one or more fluid-filled sacs (bursae) in your hip joint. This condition can cause pain, and your symptoms may come and go over time. What are the causes?  Repeated use of your hip muscles.  Injury to the hip.  Weak butt muscles.  Bone spurs.  Infection. In some cases, the cause may not be known. What increases the risk? You are more likely to develop this condition if:  You had a past hip injury or hip surgery.  You have a condition, such as arthritis, gout, diabetes, or thyroid disease.  You have spine problems.  You have one leg that is shorter than the other.  You run a lot or do long-distance running.  You play sports where there is a risk of injury or falling, such as football, martial arts, or skiing. What are the signs or symptoms? Symptoms may come and go, and they often include:  Pain in the hip or groin area. Pain may get worse when you move your hip.  Tenderness and swelling of the hip. In rare cases, the bursa may become infected. If this happens, you may get a fever, as well as have warmth and redness in the hip area. How is this treated? This condition is treated by:  Resting your hip.  Icing your hip.  Wrapping the hip area with an elastic bandage (compression wrap).  Keeping the hip raised. Other treatments may include medicine, draining fluid out of the bursa, or using crutches, a cane, or a walker. Surgery may be needed, but this is rare. Long-term treatment may include doing exercises to help your strength and flexibility. It may also include lifestyle changes like losing weight to lessen the strain on your hip. Follow these instructions at home: Managing pain, stiffness, and swelling  If told, put ice on the painful area. ? Put ice in a plastic bag. ? Place a towel between your skin and the bag. ? Leave the ice on for 20 minutes, 2-3 times a day.  Raise your hip by putting a pillow under your hips  while you lie down. Stop if you feel pain.  If told, put heat on the affected area. Do this as often as told by your doctor. Use a moist heat pack or a heating pad as told by your doctor. ? Place a towel between your skin and the heat source. ? Leave the heat on for 20-30 minutes. ? Take off the heat if your skin turns bright red. This is very important if you are unable to feel pain, heat, or cold. You may have a greater risk of getting burned.      Activity  Do not use your hip to support your body weight until your doctor says that you can.  Use crutches, a cane, or a walker as told by your doctor.  If the affected leg is one that you use to drive, ask your doctor if it is safe to drive.  Rest and protect your hip as much as you can until you feel better.  Return to your normal activities as told by your doctor. Ask your doctor what activities are safe for you.  Do exercises as told by your doctor. General instructions  Take over-the-counter and prescription medicines only as told by your doctor.  Gently rub and stretch your injured area as often as is comfortable.  Wear elastic bandages only as told by your doctor.  If one of your legs is  shorter than the other, get fitted for a shoe insert or orthotic.  Keep a healthy weight. Follow instructions from your doctor.  Keep all follow-up visits as told by your doctor. This is important. How is this prevented?  Exercise regularly, as told by your doctor.  Wear the right shoes for the sport you play.  Warm up and stretch before being active. Cool down and stretch after being active.  Take breaks often from repeated activity.  Avoid activities that bother your hip or cause pain.  Avoid sitting down for a long time. Where to find more information  [employer]: orthoinfo.aaos.org Contact a doctor if:  You have a fever.  You have new symptoms.  You have trouble walking or doing everyday  activities.  You have pain that gets worse or does not get better with medicine.  Your skin around your hip is red.  You get a feeling of warmth in your hip area. Get help right away if:  You cannot move your hip.  You have very bad pain.  You cannot control the muscles in your feet. Summary  Hip bursitis is swelling of one or more fluid-filled sacs (bursae) in your hip joint.  Symptoms often come and go over time.  This condition is often treated by resting and icing the hip. It also may help to keep the area raised and wrapped in an elastic bandage. Other treatments may be needed. This information is not intended to replace advice given to you by your health care provider. Make sure you discuss any questions you have with your health care provider. Document Revised: 03/01/2019 Document Reviewed: 01/05/2018 Elsevier Patient Education  2021 [employer]  

## 2020-06-06 NOTE — Progress Notes (Signed)
Subjective:    Patient ID: Alexis Ellis, female    DOB: July 11, 1990, 30 y.o.   MRN: [id number]  HPI  Patient presents to the clinic today with complaint of left hip pain. This started 3 weeks ago. She describes the pain dull and sore. The pain does not radiate but it is worse when she lays on that side. She denies numbness, tingling or weakness of the LLE. She denies urinary complaints, loss of bowel/bladder control. She has not noticed any rash of the area. She denies any injury to the area. She has tried Ibuprofen and heat without any relief. Ice did provide some relief.  She also reports chronic joint pain in her fingers, with associated numbness and tingling, low back pain, left hip pain and left knee pain. She would like to be checked for arthritis. She has no know family history of RA, PMR or any other joint disorders.  Review of Systems      Past Medical History:  Diagnosis Date  . ADD (attention deficit disorder)   . Chicken pox   . Depression   . Diet controlled gestational diabetes mellitus (GDM) in third trimester 03/23/2019  . Ectopic pregnancy   . Elevated blood pressure   . Elevated cholesterol   . Frequent headaches   . Gestational hypertension 04/19/2019  . History of kidney stones   . Pregnancy induced hypertension     Current Outpatient Medications  Medication Sig Dispense Refill  . amphetamine-dextroamphetamine (ADDERALL) 20 MG tablet Take 1 tablet by mouth 4 (four) times daily.    . clonazePAM (KLONOPIN) 1 MG tablet Take 1 mg by mouth 2 (two) times daily as needed.    Marland Kitchen lisinopril-hydrochlorothiazide (ZESTORETIC) 10-12.5 MG tablet Take 1 tablet by mouth daily. 90 tablet 0   No current facility-administered medications for this visit.    No Known Allergies  Family History  Problem Relation Age of Onset  . Hypertension Mother        Living  . Fibromyalgia Mother   . Diabetes Father 44       Deceased  . Heart attack Father   . Sudden death Father    . Bipolar disorder Maternal Grandmother   . Diabetes Maternal Grandfather   . Alcohol abuse Paternal Grandfather   . Liver disease Paternal Grandfather   . Hyperlipidemia Paternal Grandmother   . Kidney disease Paternal Grandmother   . Liver disease Paternal Grandmother   . Heart disease Paternal Grandmother   . Healthy Sister     Social History   Socioeconomic History  . Marital status: Single    Spouse name: Not on file  . Number of children: Not on file  . Years of education: Not on file  . Highest education level: Not on file  Occupational History  . Not on file  Tobacco Use  . Smoking status: Former Smoker    Packs/day: 0.00  . Smokeless tobacco: Never Used  Vaping Use  . Vaping Use: Never used  Substance and Sexual Activity  . Alcohol use: Not Currently    Alcohol/week: 0.0 standard drinks    Comment: social  . Drug use: No  . Sexual activity: Yes    Partners: Male    Birth control/protection: None  Other Topics Concern  . Not on file  Social History Narrative  . Not on file   Social Determinants of Health   Financial Resource Strain: Not on file  Food Insecurity: Not on file  Transportation Needs: Not on  file  Physical Activity: Not on file  Stress: Not on file  Social Connections: Not on file  Intimate Partner Violence: Not on file     Constitutional: Denies fever, malaise, fatigue, headache or abrupt weight changes.  Respiratory: Denies difficulty breathing, shortness of breath, cough or sputum production.   Cardiovascular: Denies chest pain, chest tightness, palpitations or swelling in the hands or feet.  Gastrointestinal: Denies abdominal pain, bloating, constipation, diarrhea or blood in the stool.  GU: Denies urgency, frequency, pain with urination, burning sensation, blood in urine, odor or discharge. Musculoskeletal: Pt reports left hip pain. Denies decrease in range of motion, difficulty with gait, muscle pain or joint swelling.   Neurological: Pt reports numbness and tingling in hands. Denies dizziness, difficulty with memory, difficulty with speech or problems with balance and coordination.    No other specific complaints in a complete review of systems (except as listed in HPI above).  Objective:   Physical Exam  BP 128/86   Pulse 100   Temp (!) 97.2 F (36.2 C) (Temporal)   Wt 238 lb (108 kg)   LMP 06/04/2020   SpO2 100%   BMI 39.61 kg/m   Wt Readings from Last 3 Encounters:  04/12/20 236 lb (107 kg)  03/27/20 233 lb (105.7 kg)  06/30/19 234 lb (106.1 kg)    General: Appears her stated age, obese, in NAD. Cardiovascular: Normal rate. Pulmonary/Chest: Normal effort. Abdomen: No CVA tenderness noted. Musculoskeletal: Normal flexion, extension, lateral bending and rotation to the right of the spine. Pain with rotation to the left. Normal flexion, extension, abduction, and internal rotation of the left hip. Pain with adduction and external rotation of the left hip. Pain with palpation over the left trochanter. Strength 5/5 BLE. No difficulty with gait. Neurological: Alert and oriented.     BMET    Component Value Date/Time   NA 139 03/27/2020 1227   K 4.1 03/27/2020 1227   CL 101 03/27/2020 1227   CO2 30 03/27/2020 1227   GLUCOSE 79 03/27/2020 1227   BUN 12 03/27/2020 1227   CREATININE 0.82 03/27/2020 1227   CREATININE 1.00 06/05/2015 2228   CALCIUM 9.8 03/27/2020 1227   GFRNONAA >60 05/01/2019 1723   GFRAA >60 05/01/2019 1723    Lipid Panel     Component Value Date/Time   CHOL 234 (H) 03/27/2020 1227   TRIG 134.0 03/27/2020 1227   HDL 43.20 03/27/2020 1227   CHOLHDL 5 03/27/2020 1227   VLDL 26.8 03/27/2020 1227   LDLCALC 164 (H) 03/27/2020 1227    CBC    Component Value Date/Time   WBC 15.1 (H) 05/01/2019 1723   RBC 4.44 05/01/2019 1723   HGB 12.5 05/01/2019 1723   HGB 12.7 03/22/2019 0930   HCT 37.9 05/01/2019 1723   HCT 37.7 03/22/2019 0930   PLT 282 05/01/2019 1723    PLT 280 03/22/2019 0930   MCV 85.4 05/01/2019 1723   MCV 84 03/22/2019 0930   MCH 28.2 05/01/2019 1723   MCHC 33.0 05/01/2019 1723   RDW 13.4 05/01/2019 1723   RDW 12.8 03/22/2019 0930   LYMPHSABS 2.3 11/30/2018 0955   MONOABS 0.6 11/01/2013 1051   EOSABS 0.1 11/30/2018 0955   BASOSABS 0.0 11/30/2018 0955    Hgb A1C Lab Results  Component Value Date   HGBA1C 5.3 03/27/2020          Assessment & Plan:   Acute Left Hip Pain:  ? Trochanteric bursitis RX for Pred Taper x 9 days  Continue ice as needed Xray left hip today  Multiple Joint Pain:  Will check ESR, CRP, ANA and RF today  Will follow up after labs and xray, return precautions discussed Webb Silversmith, NP This visit occurred during the SARS-CoV-2 public health emergency.  Safety protocols were in place, including screening questions prior to the visit, additional usage of staff PPE, and extensive cleaning of exam room while observing appropriate contact time as indicated for disinfecting solutions.

## 2020-06-07 ENCOUNTER — Encounter: Payer: Self-pay | Admitting: Internal Medicine

## 2020-06-07 DIAGNOSIS — M25552 Pain in left hip: Secondary | ICD-10-CM

## 2020-06-07 LAB — HIGH SENSITIVITY CRP: CRP, High Sensitivity: 9.44 mg/L — ABNORMAL HIGH (ref 0.000–5.000)

## 2020-06-07 LAB — SEDIMENTATION RATE: Sed Rate: 13 mm/hr (ref 0–20)

## 2020-06-08 ENCOUNTER — Encounter: Payer: Self-pay | Admitting: Internal Medicine

## 2020-06-08 ENCOUNTER — Telehealth: Payer: Self-pay | Admitting: Internal Medicine

## 2020-06-08 LAB — ANTI-NUCLEAR AB-TITER (ANA TITER): ANA Titer 1: 1:160 {titer} — ABNORMAL HIGH

## 2020-06-08 LAB — RHEUMATOID FACTOR: Rheumatoid fact SerPl-aCnc: <14 IU/mL (ref <14)

## 2020-06-08 LAB — ANA: Anti Nuclear Antibody (ANA): POSITIVE — AB

## 2020-06-08 NOTE — Telephone Encounter (Signed)
Pt request cb at [phone] as soon as Avie Echevaria NP looks at pt message.

## 2020-06-08 NOTE — Telephone Encounter (Signed)
Amy called from [facility] PT stated that they are OON for medicaid and she offered self-pay but she declined. Needs a new referral under medicaid

## 2020-06-08 NOTE — Telephone Encounter (Signed)
Anastayiya, can you check into this?

## 2020-06-09 NOTE — Telephone Encounter (Signed)
Spoke with patient about this. See referral notes. Sent referral to [place] center in [place] to review and schedule with patient directly. Patient was advised to call me back if she does not hear from them by the end of next week.

## 2020-06-12 NOTE — Progress Notes (Signed)
Referral placed.

## 2020-06-12 NOTE — Addendum Note (Signed)
Addended by: Jearld Fenton on: 06/12/2020 01:45 PM   Modules accepted: Orders

## 2020-06-20 ENCOUNTER — Other Ambulatory Visit: Payer: Self-pay

## 2020-06-20 ENCOUNTER — Ambulatory Visit: Payer: Medicaid Other | Attending: Internal Medicine

## 2020-06-20 DIAGNOSIS — M6281 Muscle weakness (generalized): Secondary | ICD-10-CM | POA: Insufficient documentation

## 2020-06-20 DIAGNOSIS — R262 Difficulty in walking, not elsewhere classified: Secondary | ICD-10-CM | POA: Diagnosis not present

## 2020-06-20 DIAGNOSIS — M25552 Pain in left hip: Secondary | ICD-10-CM | POA: Diagnosis not present

## 2020-06-21 NOTE — Therapy (Signed)
[place], [place], [address] Phone: [phone]   Fax:  [phone]  Physical Therapy Evaluation  Patient Details  Name: Alexis Ellis MRN: [id number] Date of Birth: 02-10-1991 Referring Provider (PT): Jearld Fenton, NP   Encounter Date: 06/20/2020   PT End of Session - 06/21/20 0757    Visit Number 1    Number of Visits 13    Date for PT Re-Evaluation 08/11/20    Authorization Type MEDICAID HEALTHY BLUE    Progress Note Due on Visit 10    PT Start Time 1840    PT Stop Time 1931    PT Time Calculation (min) 51 min    Activity Tolerance Patient tolerated treatment well    Behavior During Therapy [facility] for tasks assessed/performed           Past Medical History:  Diagnosis Date  . ADD (attention deficit disorder)   . Chicken pox   . Depression   . Diet controlled gestational diabetes mellitus (GDM) in third trimester 03/23/2019  . Ectopic pregnancy   . Elevated blood pressure   . Elevated cholesterol   . Frequent headaches   . Gestational hypertension 04/19/2019  . History of kidney stones   . Pregnancy induced hypertension     Past Surgical History:  Procedure Laterality Date  . LAPAROSCOPY N/A 09/16/2012   Procedure: LAPAROSCOPY OPERATIVE;  Surgeon: Allena Katz, MD;  Location: [facility];  Service: Gynecology;  Laterality: N/A;  Evacuation Hemoperitoneum  . UNILATERAL SALPINGECTOMY Right 09/16/2012   Procedure: UNILATERAL SALPINGECTOMY;  Surgeon: Allena Katz, MD;  Location: [facility];  Service: Gynecology;  Laterality: Right;  . WISDOM TOOTH EXTRACTION      There were no vitals filed for this visit.    Subjective Assessment - 06/20/20 1849    Subjective Pt reports L hip pain for approx 1 month with an unknown MOI. Pt states she has an 11 year Hx of low back pain. her l hip bothers her the most with sleeping, esp on the L side.    Limitations House hold activities    How long can  you sit comfortably? Sometimes if bothers he in sitting    How long can you stand comfortably? No issue    How long can you walk comfortably? Occasional pain    Diagnostic tests 06/06/20 X ray: FINDINGS:  There is no evidence of hip fracture or dislocation. There is no  evidence of arthropathy or other focal bone abnormality.     IMPRESSION:  Negative.    Patient Stated Goals To get some relief of the L hip pain    Currently in Pain? Yes    Pain Score 6    sleeping   Pain Location Hip    Pain Orientation Lateral;Left    Pain Descriptors / Indicators Aching;Dull    Pain Type Acute pain    Pain Radiating Towards NA    Pain Onset 1 to 4 weeks ago    Pain Frequency Constant    Aggravating Factors  Sleeping esp on L side, getting up from the floor after palying with 1 yaer old    Pain Relieving Factors Cold pack or heating pad    Effect of Pain on Daily Activities Significant    Multiple Pain Sites Yes    Pain Score 2   2-10/10   Pain Location Back    Pain Orientation Right;Left;Posterior;Lower    Pain Descriptors / Indicators  Burning;Sharp;Throbbing    Pain Type Chronic pain    Pain Onset More than a month ago    Pain Frequency Intermittent    Aggravating Factors  --              [facility] PT Assessment - 06/21/20 0001      Assessment   Medical Diagnosis left hip pain    Referring Provider (PT) Garnette Gunner, Coralie Keens, NP    Onset Date/Surgical Date --   approx 1 month ago   Hand Dominance Right    Prior Therapy No      Precautions   Precautions None      Restrictions   Weight Bearing Restrictions No      Balance Screen   Has the patient fallen in the past 6 months No      [place] residence    Living Arrangements Spouse/significant other;Children    Type of [place] to enter    Entrance Stairs-Number of Steps 2    Entrance Stairs-Rails None    Home Layout One level      Prior Function   Level of Independence  Independent    Vocation Unemployed      Cognition   Overall Cognitive Status Within Functional Limits for tasks assessed      Observation/Other Assessments   Focus on Therapeutic Outcomes (FOTO)  NA MCD      Sensation   Light Touch Appears Intact      Posture/Postural Control   Posture/Postural Control Postural limitations    Postural Limitations Decreased lumbar lordosis      ROM / Strength   AROM / PROM / Strength Strength;PROM      PROM   Overall PROM Comments PROM for the L hip was WNLs with provocation of pain with hip flexion and IR      Strength   Overall Strength Comments L hip demonstrates decraesed stength appearing primarily related to provocation of L pain for abd, ER, and ext.      Palpation   Palpation comment Pt was TTP of the posterior, lateral aspect of the L greater trochanter region      Transfers   Transfers Sit to Stand;Stand to Sit    Sit to Stand 7: Independent      Ambulation/Gait   Ambulation/Gait Yes    Ambulation/Gait Assistance 7: Independent    Gait Pattern Antalgic   L LE                     Objective measurements completed on examination: See above findings.               PT Education - 06/21/20 0755    Education Details Eval findings, POC, HEP, use of cold pack preiodically for pain management, sleeping positionsand support to decreased L hip pressure and strain    Person(s) Educated Patient    Methods Explanation;Demonstration;Tactile cues;Verbal cues;Handout    Comprehension Verbalized understanding;Returned demonstration;Verbal cues required;Tactile cues required            PT Short Term Goals - 06/21/20 1010      PT SHORT TERM GOAL #1   Title Pt will be ind in an initial HEP    Baseline started on eval    Status New    Target Date 07/12/20      PT SHORT TERM GOAL #2   Title Pt will voice understanding of measures; heat  pack, cold pack, sleeping positons, activity modification; to assist in the  reduction of her L hip pain,    Status New    Target Date 07/12/20             PT Long Term Goals - 06/21/20 1012      PT LONG TERM GOAL #1   Title Pt will be Ind in a final HEP to maintain or progress achieved level of function    Status New    Target Date 08/11/20      PT LONG TERM GOAL #2   Title Pt will report a decrease in L hip pain intensity to 3/10 or less and a frequency to at least intermittent with sleeping and daily activities    Baseline 6/10 and constant    Status New    Target Date 08/11/20      PT LONG TERM GOAL #3   Title Pt will demonstrate improved L hip strength of at least 4+/5 without the strength being limited by pain.    Baseline L hip strength limited by pain    Status New    Target Date 08/11/20                  Plan - 06/21/20 0811    Clinical Impression Statement Presents with acute L hip pain with an unknown MOI. Pt's signs and symptoms appear most consistent with L greater trochanteric bursitis. With 11 Hx of  low back pain, there is the possibility of the L hip pain being related to her low back issues.Pt will benefit from skilled PT 2w6 from ther ex to address flexibility and strength, pain management education and techniques, and modalities and manual techniques to decrease pain and optimize functional abilities.    Personal Factors and Comorbidities Past/Current Experience;Comorbidity 2    Comorbidities obesity, 11 Hx of low back pain    Examination-Activity Limitations Sleep;Squat;Transfers;Locomotion Level    Examination-Participation Restrictions Cleaning    Stability/Clinical Decision Making Stable/Uncomplicated    Clinical Decision Making Low    Rehab Potential Good    PT Frequency 2x / week    PT Duration 6 weeks    PT Treatment/Interventions ADLs/Self Care Home Management;Cryotherapy;Electrical Stimulation;Iontophoresis 4mg /ml Dexamethasone;Moist Heat;Ultrasound;Therapeutic exercise;Therapeutic activities;Functional mobility  training;Patient/family education;Manual techniques;Joint Manipulations;Passive range of motion;Taping    PT Next Visit Plan Assess response to HEP and education. Use o modalities and manual techniques as indicated    PT Home Exercise Plan [id number]           Patient will benefit from skilled therapeutic intervention in order to improve the following deficits and impairments:  Difficulty walking,Obesity,Pain,Decreased strength,Impaired flexibility  Visit Diagnosis: Pain in left hip  Difficulty in walking, not elsewhere classified  Muscle weakness (generalized)     Problem List Patient Active Problem List   Diagnosis Date Noted  . Depression, major, single episode, in partial remission ([place]) 03/27/2020  . HTN (hypertension) 03/27/2020  . HLD (hyperlipidemia) 09/29/2015  . Obesity (BMI 30-39.9) 09/29/2015  . ADD (attention deficit disorder) 11/07/2013    Gar Ponto MS, PT 06/21/20 10:35 AM  [facility] [address] [place], [place], [address] Phone: [phone]   Fax:  [phone]  Name: Alexis Ellis MRN: [id number] Date of Birth: 1990/10/03   Check all possible CPT codes: 20100- Therapeutic Exercise, [phone] - Gait Training, [phone] - Manual Therapy, 25498 - Therapeutic Activities, [phone] - [place], [phone] - Electrical stimulation (Manual) and [id number] - Ultrasound

## 2020-06-23 ENCOUNTER — Encounter: Payer: Self-pay | Admitting: Internal Medicine

## 2020-07-08 ENCOUNTER — Other Ambulatory Visit: Payer: Self-pay

## 2020-07-08 ENCOUNTER — Ambulatory Visit: Payer: Medicaid Other

## 2020-07-08 DIAGNOSIS — M25552 Pain in left hip: Secondary | ICD-10-CM | POA: Diagnosis not present

## 2020-07-08 DIAGNOSIS — R262 Difficulty in walking, not elsewhere classified: Secondary | ICD-10-CM | POA: Diagnosis not present

## 2020-07-08 DIAGNOSIS — M6281 Muscle weakness (generalized): Secondary | ICD-10-CM

## 2020-07-08 NOTE — Therapy (Signed)
[place], [place], [address] Phone: [phone]   Fax:  [phone]  Physical Therapy Treatment  Patient Details  Name: Alexis Ellis MRN: [id number] Date of Birth: [birth year] Referring Provider (PT): Jearld Fenton, NP   Encounter Date: 07/08/2020   PT End of Session - 07/08/20 0841    Visit Number 2    Number of Visits 13    Date for PT Re-Evaluation 08/11/20    Authorization Type MEDICAID HEALTHY BLUE    Authorization - Visit Number 1    Authorization - Number of Visits 3    Progress Note Due on Visit 10    PT Start Time 0830    PT Stop Time 0900    PT Time Calculation (min) 30 min    Activity Tolerance Patient tolerated treatment well    Behavior During Therapy [facility] for tasks assessed/performed           Past Medical History:  Diagnosis Date  . ADD (attention deficit disorder)   . Chicken pox   . Depression   . Diet controlled gestational diabetes mellitus (GDM) in third trimester 03/23/2019  . Ectopic pregnancy   . Elevated blood pressure   . Elevated cholesterol   . Frequent headaches   . Gestational hypertension 04/19/2019  . History of kidney stones   . Pregnancy induced hypertension     Past Surgical History:  Procedure Laterality Date  . LAPAROSCOPY N/A 09/16/2012   Procedure: LAPAROSCOPY OPERATIVE;  Surgeon: Allena Katz, MD;  Location: [facility];  Service: Gynecology;  Laterality: N/A;  Evacuation Hemoperitoneum  . UNILATERAL SALPINGECTOMY Right 09/16/2012   Procedure: UNILATERAL SALPINGECTOMY;  Surgeon: Allena Katz, MD;  Location: [facility];  Service: Gynecology;  Laterality: Right;  . WISDOM TOOTH EXTRACTION      There were no vitals filed for this visit.   Subjective Assessment - 07/08/20 0849    Subjective Improved. Intermittent, not daily. exs and education helpful. 4/10 when painful.    How long can you sit comfortably? Sometimes if bothers he in sitting     How long can you stand comfortably? No issue    Patient Stated Goals To get some relief of the L hip pain    Currently in Pain? No/denies    Pain Score 0-No pain    Pain Location Hip    Pain Orientation Left;Lateral    Pain Descriptors / Indicators Aching;Dull    Pain Type Acute pain    Pain Onset More than a month ago    Pain Frequency Intermittent                             [facility] Adult PT Treatment/Exercise - 07/08/20 0001      Self-Care   Self-Care Other Self-Care Comments    Other Self-Care Comments  See Education      Exercises   Exercises Lumbar;Knee/Hip      Lumbar Exercises: Stretches   Single Knee to Chest Stretch Right;Left;2 reps;20 seconds    Pelvic Tilt 10 reps;2 reps      Knee/Hip Exercises: Stretches   Piriformis Stretch Left;2 reps;20 seconds      Knee/Hip Exercises: Supine   Bridges 10 reps      Knee/Hip Exercises: Prone   Other Prone Exercises quadruped: Hip ext kicks L and R, 10x  PT Education - 07/08/20 0909    Education Details Tennis ball for L glute massage, avoiding positions of static stresses for L hip    Person(s) Educated Patient    Methods Explanation;Demonstration;Tactile cues;Verbal cues;Handout    Comprehension Verbalized understanding;Returned demonstration;Verbal cues required;Tactile cues required;Need further instruction            PT Short Term Goals - 06/21/20 1010      PT SHORT TERM GOAL #1   Title Pt will be ind in an initial HEP    Baseline started on eval    Status New    Target Date 07/12/20      PT SHORT TERM GOAL #2   Title Pt will voice understanding of measures; heat pack, cold pack, sleeping positons, activity modification; to assist in the reduction of her L hip pain,    Status New    Target Date 07/12/20             PT Long Term Goals - 06/21/20 1012      PT LONG TERM GOAL #1   Title Pt will be Ind in a final HEP to maintain or progress achieved level of  function    Status New    Target Date 08/11/20      PT LONG TERM GOAL #2   Title Pt will report a decrease in L hip pain intensity to 3/10 or less and a frequency to at least intermittent with sleeping and daily activities    Baseline 6/10 and constant    Status New    Target Date 08/11/20      PT LONG TERM GOAL #3   Title Pt will demonstrate improved L hip strength of at least 4+/5 without the strength being limited by pain.    Baseline L hip strength limited by pain    Status New    Target Date 08/11/20                 Plan - 07/08/20 0913    Clinical Impression Statement Pt's pain is improved from constant to intermittent issues with decreased intensity. PT addressed hip and lumbopelvic flexibility/strengthening, as well as Ed for tennis ball massage for the L glute and avoiding static positions of pressure on the L hip. Pt. tolerated otday's session s adverse effects.    Personal Factors and Comorbidities Past/Current Experience;Comorbidity 2    Comorbidities obesity, 11 Hx of low back pain    Examination-Activity Limitations Sleep;Squat;Transfers;Locomotion Level    Examination-Participation Restrictions Cleaning    Stability/Clinical Decision Making Stable/Uncomplicated    Clinical Decision Making Low    Rehab Potential Good    PT Frequency 2x / week    PT Duration 6 weeks    PT Treatment/Interventions ADLs/Self Care Home Management;Cryotherapy;Electrical Stimulation;Iontophoresis 4mg /ml Dexamethasone;Moist Heat;Ultrasound;Therapeutic exercise;Therapeutic activities;Functional mobility training;Patient/family education;Manual techniques;Joint Manipulations;Passive range of motion;Taping    PT Next Visit Plan Assess response to HEP and education. Use of modalities and manual techniques as indicated    PT Home Exercise Plan [email or web address]           Patient will benefit from skilled therapeutic intervention in order to improve the following deficits and impairments:   Difficulty walking,Obesity,Pain,Decreased strength,Impaired flexibility  Visit Diagnosis: Pain in left hip  Difficulty in walking, not elsewhere classified  Muscle weakness (generalized)     Problem List Patient Active Problem List   Diagnosis Date Noted  . Depression, major, single episode, in partial remission ([place]) 03/27/2020  . HTN (hypertension) 03/27/2020  .  HLD (hyperlipidemia) 09/29/2015  . Obesity (BMI 30-39.9) 09/29/2015  . ADD (attention deficit disorder) 11/07/2013    Gar Ponto MS, PT 07/08/20 9:24 AM  [facility] [address] [place], [place], [address] Phone: [phone]   Fax:  [phone]  Name: Alexis Ellis MRN: [id number] Date of Birth: July 23, 1990

## 2020-07-13 ENCOUNTER — Ambulatory Visit: Payer: Medicaid Other

## 2020-07-15 ENCOUNTER — Other Ambulatory Visit: Payer: Self-pay

## 2020-07-15 ENCOUNTER — Ambulatory Visit: Payer: Medicaid Other | Attending: Internal Medicine

## 2020-07-15 DIAGNOSIS — R262 Difficulty in walking, not elsewhere classified: Secondary | ICD-10-CM | POA: Diagnosis not present

## 2020-07-15 DIAGNOSIS — M6281 Muscle weakness (generalized): Secondary | ICD-10-CM | POA: Insufficient documentation

## 2020-07-15 DIAGNOSIS — M25552 Pain in left hip: Secondary | ICD-10-CM | POA: Insufficient documentation

## 2020-07-16 NOTE — Therapy (Signed)
[place], [place], [address] Phone: [phone]   Fax:  [phone]  Physical Therapy Treatment  Patient Details  Name: Alexis Ellis MRN: [id number] Date of Birth: 06-16-1990 Referring Provider (PT): Jearld Fenton, NP   Encounter Date: 07/15/2020   PT End of Session - 07/16/20 1012    Visit Number 3    Number of Visits 13    Date for PT Re-Evaluation 08/11/20    Authorization Type MEDICAID HEALTHY BLUE    Authorization - Visit Number 2    Authorization - Number of Visits 3    Progress Note Due on Visit 10    PT Start Time [phone]    PT Stop Time 1035    PT Time Calculation (min) 44 min    Activity Tolerance Patient tolerated treatment well    Behavior During Therapy [facility] for tasks assessed/performed           Past Medical History:  Diagnosis Date  . ADD (attention deficit disorder)   . Chicken pox   . Depression   . Diet controlled gestational diabetes mellitus (GDM) in third trimester 03/23/2019  . Ectopic pregnancy   . Elevated blood pressure   . Elevated cholesterol   . Frequent headaches   . Gestational hypertension 04/19/2019  . History of kidney stones   . Pregnancy induced hypertension     Past Surgical History:  Procedure Laterality Date  . LAPAROSCOPY N/A 09/16/2012   Procedure: LAPAROSCOPY OPERATIVE;  Surgeon: Allena Katz, MD;  Location: [facility];  Service: Gynecology;  Laterality: N/A;  Evacuation Hemoperitoneum  . UNILATERAL SALPINGECTOMY Right 09/16/2012   Procedure: UNILATERAL SALPINGECTOMY;  Surgeon: Allena Katz, MD;  Location: [facility];  Service: Gynecology;  Laterality: Right;  . WISDOM TOOTH EXTRACTION      There were no vitals filed for this visit.   Subjective Assessment - 07/16/20 0958    Subjective My night pain is better. I have the most hip pain caring for and playing with my son. Pt states she has started working out, and that tennis ball massage has  been helpful.    Diagnostic tests 06/06/20 X ray: FINDINGS:  There is no evidence of hip fracture or dislocation. There is no  evidence of arthropathy or other focal bone abnormality.     IMPRESSION:  Negative.    Patient Stated Goals To get some relief of the L hip pain    Currently in Pain? No/denies    Pain Score 5     Pain Location Hip    Pain Orientation Left;Lateral    Pain Descriptors / Indicators Aching;Dull    Pain Type Acute pain    Pain Onset More than a month ago    Pain Frequency Intermittent    Pain Score 3    Pain Location Back    Pain Orientation Posterior;Lower    Pain Descriptors / Indicators Throbbing;Sharp;Burning    Pain Type Chronic pain    Pain Onset More than a month ago    Pain Frequency Intermittent                             [facility] Adult PT Treatment/Exercise - 07/16/20 0001      Exercises   Exercises Lumbar;Knee/Hip      Lumbar Exercises: Stretches   Active Hamstring Stretch Right;Left;1 rep;20 seconds    Single Knee to Chest Stretch Right;Left;2 reps;20  seconds    Piriformis Stretch Right;Left;1 rep;20 seconds    Other Lumbar Stretch Exercise Forward trunk flexion and laterally each, 20 sec      Lumbar Exercises: Supine   Pelvic Tilt 10 reps   3 sec   Bent Knee Raise 10 reps   2 sest   Bent Knee Raise Limitations marching    Dead Bug 5 reps;5 seconds    Dead Bug Limitations Bracing; LEs only   pt had difficulty maintaining a flat back. Will try again later.     Lumbar Exercises: Sidelying   Other Sidelying Lumbar Exercises L amd R planks; 5x; 5 sec      Lumbar Exercises: Prone   Straight Leg Raise 10 reps;3 seconds      Knee/Hip Exercises: Supine   Bridges 10 reps;2 sets                  PT Education - 07/16/20 1011    Education Details HEP-See instructions    Person(s) Educated Patient    Methods Explanation;Demonstration;Tactile cues;Verbal cues;Handout    Comprehension Verbalized understanding;Returned  demonstration;Verbal cues required;Tactile cues required;Need further instruction            PT Short Term Goals - 07/16/20 1014      PT SHORT TERM GOAL #1   Title Pt will be ind in an initial HEP    Baseline started on eval    Status Achieved    Target Date 07/15/20      PT SHORT TERM GOAL #2   Title Pt will voice understanding of measures; heat pack, cold pack, sleeping positons, activity modification; to assist in the reduction of her L hip pain,    Status Achieved    Target Date 07/15/20             PT Long Term Goals - 06/21/20 1012      PT LONG TERM GOAL #1   Title Pt will be Ind in a final HEP to maintain or progress achieved level of function    Status New    Target Date 08/11/20      PT LONG TERM GOAL #2   Title Pt will report a decrease in L hip pain intensity to 3/10 or less and a frequency to at least intermittent with sleeping and daily activities    Baseline 6/10 and constant    Status New    Target Date 08/11/20      PT LONG TERM GOAL #3   Title Pt will demonstrate improved L hip strength of at least 4+/5 without the strength being limited by pain.    Baseline L hip strength limited by pain    Status New    Target Date 08/11/20                 Plan - 07/16/20 1013    Clinical Impression Statement PT was completed for lumbpelvic/hip flexibility and strengthening. Ther ex was progressed and added to the pt's HEP. Pt's L hip pain is less frequent and she is sleeping better. Pt has started working out and is using recommendations to help manage the L hip pain.    Personal Factors and Comorbidities Past/Current Experience;Comorbidity 2    Comorbidities obesity, 11 yr Hx of low back pain    Examination-Activity Limitations Sleep;Squat;Transfers;Locomotion Level    Examination-Participation Restrictions Cleaning    Stability/Clinical Decision Making Stable/Uncomplicated    Clinical Decision Making Low    Rehab Potential Good    PT  Frequency 2x /  week    PT Duration 6 weeks    PT Treatment/Interventions ADLs/Self Care Home Management;Cryotherapy;Electrical Stimulation;Iontophoresis 4mg /ml Dexamethasone;Moist Heat;Ultrasound;Therapeutic exercise;Therapeutic activities;Functional mobility training;Patient/family education;Manual techniques;Joint Manipulations;Passive range of motion;Taping    PT Next Visit Plan Assess response to HEP and education. Use of modalities and manual techniques as indicated    PT Home Exercise Plan [id number]           Patient will benefit from skilled therapeutic intervention in order to improve the following deficits and impairments:  Difficulty walking,Obesity,Pain,Decreased strength,Impaired flexibility  Visit Diagnosis: Pain in left hip  Difficulty in walking, not elsewhere classified  Muscle weakness (generalized)     Problem List Patient Active Problem List   Diagnosis Date Noted  . Depression, major, single episode, in partial remission ([place]) 03/27/2020  . HTN (hypertension) 03/27/2020  . HLD (hyperlipidemia) 09/29/2015  . Obesity (BMI 30-39.9) 09/29/2015  . ADD (attention deficit disorder) 11/07/2013    Gar Ponto MS, PT 07/16/20 10:33 AM  [facility] [address] [place], [place], [address] Phone: [phone]   Fax:  [phone]  Name: Alexis Ellis MRN: [id number] Date of Birth: 01-12-1991

## 2020-07-17 NOTE — Progress Notes (Signed)
Office Visit Note  Patient: Alexis Ellis             Date of Birth: Jun 28, 1990           MRN: [id number]             PCP: Jearld Fenton, NP Referring: Jearld Fenton, NP Visit Date: 07/18/2020 Occupation: Formerly patient coordinator  Subjective:  New Patient (Initial Visit) (Patient complains of left knee pain and swelling, bilateral hand pain, swelling, stiffness, numbness, and tingling, and left hip pain. Patient also complains of low back pain. Patient has previously taken oral steroids which helps, then the pain returns as soon as she quits taking the medication. )   History of Present Illness: Alexis Ellis is a 30 y.o. female with history of ADD, HTN, and trigger finger here for evaluation of joint pain in multiple sites with positive ANA and increased CRP. She has longstanding problems with triggering and pain in the left 2nd digit for which she had multiple steroid injections with different hand specialists. More recently she started experiencing more generalized hand pain and also left hip and bilateral knee pain. This knee pain is usually in the front of the knee. She notices some pain increased with getting up from the floor and climbing stairs, but also sometimes can bother her lying in bed at night. She was sick with COVID a few months ago and is also having more symptoms ongoing after that. Lab tests in January indicated elevated CRP and positive ANA checked due to the ongoing pain. Xray of the left hip was unremarkable and she started working with physicay therapy for this lately and feels it is starting to help. She also took prednisone earlier and again about a month ago which helps her symptoms dramatically but returns to normal quickly after stopping the steroids. She denies alopecia, oral ulcers, lymphadenopathy, fevers, raynaud's phenomenon, or history of blood clots. She does have a history of multiple early term miscarriages. Her son is 19 months old now.  Labs  reviewed 05/2020 ANA 1:160 dense fine speckled RF neg hsCRP 9.4 ESR 13 TSH 1.25  Imaging reviewed 05/2020 Xray left hip There is no evidence of hip fracture or dislocation. There is no evidence of arthropathy or other focal bone abnormality.  Activities of Daily Living:  Patient reports morning stiffness for 1-5 hours.   Patient Reports nocturnal pain.  Difficulty dressing/grooming: Denies Difficulty climbing stairs: Reports Difficulty getting out of chair: Reports Difficulty using hands for taps, buttons, cutlery, and/or writing: Reports  Review of Systems  Constitutional: Negative for fatigue.  HENT: Negative for mouth sores, mouth dryness and nose dryness.   Eyes: Positive for dryness. Negative for pain, itching and visual disturbance.  Respiratory: Negative for cough, hemoptysis, shortness of breath and difficulty breathing.   Cardiovascular: Positive for swelling in legs/feet. Negative for chest pain and palpitations.  Gastrointestinal: Positive for constipation. Negative for abdominal pain, blood in stool and diarrhea.  Endocrine: Negative for increased urination.  Genitourinary: Negative for painful urination.  Musculoskeletal: Positive for arthralgias, joint pain, joint swelling and morning stiffness. Negative for myalgias, muscle weakness, muscle tenderness and myalgias.  Skin: Positive for redness. Negative for color change and rash.  Allergic/Immunologic: Negative for susceptible to infections.  Neurological: Positive for dizziness, numbness and headaches. Negative for memory loss and weakness.  Hematological: Negative for swollen glands.  Psychiatric/Behavioral: Positive for sleep disturbance. Negative for confusion.    PMFS History:  Patient Active Problem List  Diagnosis Date Noted  . Positive ANA (antinuclear antibody) 07/18/2020  . Bilateral knee pain 07/18/2020  . Pain in left hip 07/18/2020  . HTN (hypertension) 03/27/2020  . HLD (hyperlipidemia)  09/29/2015  . Obesity (BMI 30-39.9) 09/29/2015  . ADD (attention deficit disorder) 11/07/2013  . Trigger index finger of left hand 06/28/2011    Past Medical History:  Diagnosis Date  . ADD (attention deficit disorder)   . Chicken pox   . Depression   . Diet controlled gestational diabetes mellitus (GDM) in third trimester 03/23/2019  . Ectopic pregnancy   . Elevated blood pressure   . Elevated cholesterol   . Frequent headaches   . Gestational hypertension 04/19/2019  . History of kidney stones   . Pregnancy induced hypertension     Family History  Problem Relation Age of Onset  . Hypertension Mother        Living  . Fibromyalgia Mother   . Diabetes Father 40       Deceased  . Heart attack Father   . Sudden death Father   . Bipolar disorder Maternal Grandmother   . Diabetes Maternal Grandfather   . Alcohol abuse Paternal Grandfather   . Liver disease Paternal Grandfather   . Hyperlipidemia Paternal Grandmother   . Kidney disease Paternal Grandmother   . Liver disease Paternal Grandmother   . Heart disease Paternal Grandmother   . Healthy Sister   . Healthy Son    Past Surgical History:  Procedure Laterality Date  . LAPAROSCOPY N/A 09/16/2012   Procedure: LAPAROSCOPY OPERATIVE;  Surgeon: Allena Katz, MD;  Location: [facility];  Service: Gynecology;  Laterality: N/A;  Evacuation Hemoperitoneum  . TUBAL LIGATION  2014  . UNILATERAL SALPINGECTOMY Right 09/16/2012   Procedure: UNILATERAL SALPINGECTOMY;  Surgeon: Allena Katz, MD;  Location: [facility];  Service: Gynecology;  Laterality: Right;  . WISDOM TOOTH EXTRACTION     Social History   Social History Narrative  . Not on file   Immunization History  Administered Date(s) Administered  . DTaP 02/12/1989, 04/18/1989, 06/20/1989, 03/04/1990, 09/17/1993  . HPV Quadrivalent 03/25/2006, 06/10/2006, 05/15/2007  . Hepatitis B 02/06/2000, 03/12/2000, 08/26/2000  . HiB (PRP-OMP) 01/20/1990, 09/17/1993  . IPV  02/12/1989, 04/18/1989, 03/04/1990, 09/17/1993  . Influenza-Unspecified 12/16/2018  . MMR 03/04/1990, 09/17/1993  . Tdap 03/02/2007, 03/22/2019     Objective: Vital Signs: BP 121/81 (BP Location: Right Arm, Patient Position: Sitting, Cuff Size: Large)   Pulse (!) 112   Ht 5' 4.5" (1.638 m)   Wt 239 lb 3.2 oz (108.5 kg)   BMI 40.42 kg/m    Physical Exam Constitutional:      Appearance: She is obese.  HENT:     Right Ear: External ear normal.     Left Ear: External ear normal.     Mouth/Throat:     Mouth: Mucous membranes are moist.     Pharynx: Oropharynx is clear.  Eyes:     Conjunctiva/sclera: Conjunctivae normal.  Cardiovascular:     Rate and Rhythm: Regular rhythm. Tachycardia present.  Pulmonary:     Effort: Pulmonary effort is normal.     Breath sounds: Normal breath sounds.  Skin:    General: Skin is warm and dry.     Comments: Generalized patchy blanchable erythema, sweating Livedo reticularis pattern on medial half of legs from ankle to thigh  Neurological:     General: No focal deficit present.     Mental Status: She is alert.  Psychiatric:  Mood and Affect: Mood normal.     Musculoskeletal Exam:  Neck full ROM no tenderness Shoulders full ROM no tenderness or swelling Elbows full ROM no tenderness or swelling Wrists full ROM no tenderness or swelling Fingers full ROM no tenderness or swelling Hip normal internal and external rotation without pain, left lateral hip tenderness, posterior pain with Pace maneuver Knees full ROM, patellofemoral crepitus with uneven patellar glide, no large effusions present Ankles full ROM no tenderness or swelling MTPs full ROM no tenderness or swelling, callus under 2nd MTP joint   Investigation: No additional findings.  Imaging: No results found.  Recent Labs: Lab Results  Component Value Date   WBC 15.1 (H) 05/01/2019   HGB 12.5 05/01/2019   PLT 282 05/01/2019   NA 139 03/27/2020   K 4.1 03/27/2020    CL 101 03/27/2020   CO2 30 03/27/2020   GLUCOSE 79 03/27/2020   BUN 12 03/27/2020   CREATININE 0.82 03/27/2020   BILITOT 0.7 03/27/2020   ALKPHOS 84 03/27/2020   AST 20 03/27/2020   ALT 42 (H) 03/27/2020   PROT 7.1 03/27/2020   ALBUMIN 4.6 03/27/2020   CALCIUM 9.8 03/27/2020   GFRAA >60 05/01/2019    Speciality Comments: No specialty comments available.  Procedures:  No procedures performed Allergies: Patient has no known allergies.   Assessment / Plan:     Visit Diagnoses: Positive ANA (antinuclear antibody) - Plan: C-reactive protein, RNP Antibody, Anti-Smith antibody, Sjogrens syndrome-A extractable nuclear antibody, Sjogrens syndrome-B extractable nuclear antibody, Anti-DNA antibody, double-stranded, C3 and C4  She has joint pain in multiple sites and positive ANA although no obvious inflammatory joint changes are seen on exam today. The chronic finger triggering seems to be a separate process.  She has livedo reticularis pattern prominently on bilateral legs but is also a fair skinned young woman on sympathomimetics for ADD.  This could be related to a reactive process dense fine stranded pattern is less strongly predictive for primary autoimmune disease.  We will check CRP for any trend in inflammatory marker we will check more specific antibodies including RNP, Smith, SSA, SSB, dsDNA, and serum complements.  Pain of both knees  Bilateral knee pain no large effusions on exam and has some crepitus or very uneven patellar tracking.  Suspicious for patellofemoral pain syndrome possibly from patellar tracking.  No systemic inflammatory process is fine recommend managing for this problem.  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  She is currently working with PT for this and making some progress.  Orders: Orders Placed This Encounter   Procedures  . C-reactive protein  . RNP Antibody  . Anti-Smith antibody  . Sjogrens syndrome-A extractable nuclear antibody  . Sjogrens syndrome-B extractable nuclear antibody  . Anti-DNA antibody, double-stranded  . C3 and C4   No orders of the defined types were placed in this encounter.    Follow-Up Instructions: No follow-ups on file.   Collier Salina, MD  Note - This record has been created using [employer].  Chart creation errors have been sought, but may not always  have been located. Such creation errors do not reflect on  the standard of medical care.

## 2020-07-18 ENCOUNTER — Encounter: Payer: Self-pay | Admitting: Internal Medicine

## 2020-07-18 ENCOUNTER — Other Ambulatory Visit: Payer: Self-pay

## 2020-07-18 ENCOUNTER — Ambulatory Visit: Payer: Medicaid Other | Admitting: Internal Medicine

## 2020-07-18 VITALS — BP 121/81 | HR 112 | Ht 64.5 in | Wt 239.2 lb

## 2020-07-18 DIAGNOSIS — M25552 Pain in left hip: Secondary | ICD-10-CM | POA: Insufficient documentation

## 2020-07-18 DIAGNOSIS — M25562 Pain in left knee: Secondary | ICD-10-CM | POA: Diagnosis not present

## 2020-07-18 DIAGNOSIS — M25561 Pain in right knee: Secondary | ICD-10-CM | POA: Diagnosis not present

## 2020-07-18 DIAGNOSIS — G8929 Other chronic pain: Secondary | ICD-10-CM

## 2020-07-18 DIAGNOSIS — R768 Other specified abnormal immunological findings in serum: Secondary | ICD-10-CM | POA: Diagnosis not present

## 2020-07-18 NOTE — Patient Instructions (Addendum)
I am checking additional, more specific antibody tests associated with the positive ANA that would show evidence for a condition such as sjogren's syndrome, lupus or other underlying autoimmune disease as a cause of joint inflammation.  I suspect at least some of the knee pain could be coming from abnormal movement of the kneecap either from some alignment problem or damage or softening of the cartilage. This would usually benefit with some use of antiinflammatory medicines like steroids or NSAIDs but mostly from stretching the involved joints and sometimes with shoe orthotics.   Patellofemoral Pain Syndrome  Patellofemoral pain syndrome is a condition in which the tissue (cartilage) on the underside of the kneecap (patella) softens or breaks down. This causes pain in the front of the knee. The condition is also called runner's knee or chondromalacia patella. Patellofemoral pain syndrome is most common in young adults who are active in sports. The knee is the largest joint in the body. The patella covers the front of the knee and is attached to muscles above and below the knee. The underside of the patella is covered with a smooth type of cartilage (synovium). The smooth surface helps the patella glide easily when you move your knee. Patellofemoral pain syndrome causes swelling in the joint linings and bone surfaces in the knee. What are the causes? This condition may be caused by:  Overuse of the knee.  Poor alignment of your knee joints.  Weak leg muscles.  A direct hit to your kneecap. What increases the risk? You are more likely to develop this condition if:  You do a lot of activities that can wear down your kneecap. These include: ? Running. ? Squatting. ? Climbing stairs.  You start a new physical activity or exercise program.  You wear shoes that do not fit well.  You do not have good leg strength.  You are overweight. What are the signs or symptoms? The main symptom of  this condition is knee pain. This may feel like a dull, aching pain underneath your patella, in the front of your knee. There may be a popping or cracking sound when you move your knee. Pain may get worse with:  Exercise.  Climbing stairs.  Running.  Jumping.  Squatting.  Kneeling.  Sitting for a long time.  Moving or pushing on your patella. How is this diagnosed? This condition may be diagnosed based on:  Your symptoms and medical history. You may be asked about your recent physical activities and which ones cause knee pain.  A physical exam. This may include: ? Moving your patella back and forth. ? Checking your range of knee motion. ? Having you squat or jump to see if you have pain. ? Checking the strength of your leg muscles.  Imaging tests to confirm the diagnosis. These may include an MRI of your knee. How is this treated? This condition may be treated at home with rest, ice, compression, and elevation (Alexis Ellis).  Other treatments may include:  NSAIDs, such as ibuprofen.  Physical therapy to stretch and strengthen your leg muscles.  Shoe inserts (orthotics) to take stress off your knee.  A knee brace or knee support.  Adhesive tapes to the skin.  Surgery to remove damaged cartilage or move the patella to a better position. This is rare. Follow these instructions at home: If you have a brace:  Wear the brace as told by your health care provider. Remove it only as told by your health care provider.  Loosen the brace  if your toes tingle, become numb, or turn cold and blue.  Keep the brace clean.  If the brace is not waterproof: ? Do not let it get wet. ? Cover it with a watertight covering when you take a bath or a shower. Managing pain, stiffness, and swelling  If directed, put ice on the painful area. To do this: ? If you have a removable brace, remove it as told by your health care provider. ? Put ice in a plastic bag. ? Place a towel between your  skin and the bag. ? Leave the ice on for 20 minutes, 2-3 times a day. ? Remove the ice if your skin turns bright red. This is very important. If you cannot feel pain, heat, or cold, you have a greater risk of damage to the area.  Move your toes often to reduce stiffness and swelling.  Raise (elevate) the injured area above the level of your heart while you are sitting or lying down.   Activity  Rest your knee.  Avoid activities that cause knee pain.  Perform stretching and strengthening exercises as told by your health care provider or physical therapist.  Return to your normal activities as told by your health care provider. Ask your health care provider what activities are safe for you. General instructions  Take over-the-counter and prescription medicines only as told by your health care provider.  Use splints, braces, knee supports, or walking aids as directed by your health care provider.  Do not use any products that contain nicotine or tobacco, such as cigarettes, e-cigarettes, and chewing tobacco. These can delay healing. If you need help quitting, ask your health care provider.  Keep all follow-up visits. This is important. Contact a health care provider if:  Your symptoms get worse.  You are not improving with home care. Summary  Patellofemoral pain syndrome is a condition in which the tissue (cartilage) on the underside of the kneecap (patella) softens or breaks down.  This condition causes swelling in the joint linings and bone surfaces in the knee. This leads to pain in the front of the knee.  This condition may be treated at home with rest, ice, compression, and elevation (Alexis Ellis).  Use splints, braces, knee supports, or walking aids as directed by your health care provider.

## 2020-07-19 ENCOUNTER — Ambulatory Visit: Payer: Medicaid Other

## 2020-07-19 ENCOUNTER — Other Ambulatory Visit: Payer: Self-pay

## 2020-07-19 LAB — C3 AND C4
C3 Complement: 182 mg/dL (ref 83–193)
C4 Complement: 42 mg/dL (ref 15–57)

## 2020-07-19 LAB — SJOGRENS SYNDROME-A EXTRACTABLE NUCLEAR ANTIBODY: SSA (Ro) (ENA) Antibody, IgG: <1 AI

## 2020-07-19 LAB — SJOGRENS SYNDROME-B EXTRACTABLE NUCLEAR ANTIBODY: SSB (La) (ENA) Antibody, IgG: <1 AI

## 2020-07-19 LAB — C-REACTIVE PROTEIN: CRP: 6.8 mg/L (ref <8.0)

## 2020-07-19 LAB — RNP ANTIBODY: Ribonucleic Protein(ENA) Antibody, IgG: <1 AI

## 2020-07-19 LAB — ANTI-DNA ANTIBODY, DOUBLE-STRANDED: ds DNA Ab: <1 IU/mL

## 2020-07-19 LAB — ANTI-SMITH ANTIBODY: ENA SM Ab Ser-aCnc: <1 AI

## 2020-07-21 NOTE — Progress Notes (Signed)
Lab tests do not show any evidence for lupus or other autoimmune diseases related to the positive ANA test. The CRP marker for inflammation is also normal now, compared to slightly elevated when checked in her primary care clinic. So the positive ANA results does not look significant. The problem with patellofemoral syndrome pain should continue with therapy and NSAIDs as needed and could see a foot specialist about orthotics. I would not recommend steroid or joint injection unless symptoms getting worse instead of better.

## 2020-07-22 ENCOUNTER — Ambulatory Visit: Payer: Medicaid Other

## 2020-07-25 ENCOUNTER — Ambulatory Visit: Payer: Medicaid Other

## 2020-07-25 ENCOUNTER — Other Ambulatory Visit: Payer: Self-pay

## 2020-07-25 DIAGNOSIS — M25552 Pain in left hip: Secondary | ICD-10-CM

## 2020-07-25 DIAGNOSIS — M6281 Muscle weakness (generalized): Secondary | ICD-10-CM

## 2020-07-25 DIAGNOSIS — R262 Difficulty in walking, not elsewhere classified: Secondary | ICD-10-CM

## 2020-07-26 NOTE — Therapy (Addendum)
[place], [place], [address] Phone: [phone]   Fax:  [phone]  Physical Therapy Treatment/Discharge  Patient Details  Name: Alexis Ellis MRN: [id number] Date of Birth: July 28, 1990 Referring Provider (PT): Jearld Fenton, NP   Encounter Date: 07/25/2020   PT End of Session - 07/26/20 0718     Visit Number 4    Number of Visits 13    Date for PT Re-Evaluation 08/11/20    Authorization Type MEDICAID HEALTHY [id number] visits from 07/08/20-08/19/20    Authorization Time Period 12 visits from 07/08/20-08/19/20    Authorization - Visit Number 3    Authorization - Number of Visits 12    Progress Note Due on Visit 10    PT Start Time 1758    PT Stop Time 1834    PT Time Calculation (min) 36 min    Activity Tolerance Patient tolerated treatment well    Behavior During Therapy [facility] for tasks assessed/performed             Past Medical History:  Diagnosis Date   ADD (attention deficit disorder)    Chicken pox    Depression    Diet controlled gestational diabetes mellitus (GDM) in third trimester 03/23/2019   Ectopic pregnancy    Elevated blood pressure    Elevated cholesterol    Frequent headaches    Gestational hypertension 04/19/2019   History of kidney stones    Pregnancy induced hypertension     Past Surgical History:  Procedure Laterality Date   LAPAROSCOPY N/A 09/16/2012   Procedure: LAPAROSCOPY OPERATIVE;  Surgeon: Allena Katz, MD;  Location: [facility];  Service: Gynecology;  Laterality: N/A;  Evacuation Hemoperitoneum   TUBAL LIGATION  2014   UNILATERAL SALPINGECTOMY Right 09/16/2012   Procedure: UNILATERAL SALPINGECTOMY;  Surgeon: Allena Katz, MD;  Location: [facility];  Service: Gynecology;  Laterality: Right;   WISDOM TOOTH EXTRACTION      There were no vitals filed for this visit.   Subjective Assessment - 07/25/20 1801     Subjective Pt reports she has not had any hip pain  for the past 5  days, her low back is doing better as well. her L knee is what is bothering her now at 6/10.    Diagnostic tests 06/06/20 X ray: FINDINGS:  There is no evidence of hip fracture or dislocation. There is no  evidence of arthropathy or other focal bone abnormality.     IMPRESSION:  Negative.    Patient Stated Goals To get some relief of the L hip pain    Currently in Pain? No/denies    Pain Score 0-No pain    Pain Location Hip    Pain Orientation Left    Multiple Pain Sites Yes    Pain Score 0    Pain Location Back    Pain Orientation Posterior;Lower    Pain Score 6    Pain Location Knee    Pain Orientation Left    Pain Descriptors / Indicators Aching;Sharp    Pain Type Acute pain    Pain Onset In the past 7 days    Aggravating Factors  Getting up and down fron the floor, steps                               [facility] Adult PT Treatment/Exercise - 07/26/20 0001       Exercises  Exercises Lumbar;Knee/Hip      Lumbar Exercises: Stretches   Other Lumbar Stretch Exercise Forward trunk flexion and laterally each, 20 sec      Lumbar Exercises: Supine   Pelvic Tilt 10 reps   3 sec   Bent Knee Raise 10 reps   2 sest   Bent Knee Raise Limitations marching      Lumbar Exercises: Sidelying   Other Sidelying Lumbar Exercises L amd R planks; 5x; 5 sec      Lumbar Exercises: Prone   Straight Leg Raise 10 reps;3 seconds   2 sets     Knee/Hip Exercises: Supine   Hip Adduction Isometric 10 reps    Hip Adduction Isometric Limitations ball squeeze    Bridges 10 reps    Straight Leg Raises Right;Left;10 reps    Straight Leg Raises Limitations quad set prio    Straight Leg Raise with External Rotation Right;Left;10 reps    Straight Leg Raise with External Rotation Limitations quad set prior                    PT Education - 07/26/20 0718     Education Details HEP. Eduaction re: asc/dsc steps and up/down from floor using chair support (to play  with son) to reduce L knee.    Person(s) Educated Patient    Methods Explanation;Demonstration;Verbal cues    Comprehension Verbalized understanding;Returned demonstration;Verbal cues required              PT Short Term Goals - 07/16/20 1014       PT SHORT TERM GOAL #1   Title Pt will be ind in an initial HEP    Baseline started on eval    Status Achieved    Target Date 07/15/20      PT SHORT TERM GOAL #2   Title Pt will voice understanding of measures; heat pack, cold pack, sleeping positons, activity modification; to assist in the reduction of her L hip pain,    Status Achieved    Target Date 07/15/20               PT Long Term Goals - 06/21/20 1012       PT LONG TERM GOAL #1   Title Pt will be Ind in a final HEP to maintain or progress achieved level of function    Status New    Target Date 08/11/20      PT LONG TERM GOAL #2   Title Pt will report a decrease in L hip pain intensity to 3/10 or less and a frequency to at least intermittent with sleeping and daily activities    Baseline 6/10 and constant    Status New    Target Date 08/11/20      PT LONG TERM GOAL #3   Title Pt will demonstrate improved L hip strength of at least 4+/5 without the strength being limited by pain.    Baseline L hip strength limited by pain    Status New    Target Date 08/11/20                   Plan - 07/26/20 8588     Clinical Impression Statement PT is making appropriate progress re: L hip pain and low back pain. Continued PT for lumbopelvic/hip flexibility and strengthening. 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 Hip/knee strengthening exs were added to see if they will be beneficial with reducing pt's knee pain.  Pt tolerated the PT session well without adverse effects.    Personal Factors and Comorbidities Past/Current Experience;Comorbidity 2    Comorbidities obesity, 11 yr Hx of low back pain    Examination-Activity Limitations  Sleep;Squat;Transfers;Locomotion Level    Examination-Participation Restrictions Cleaning    Stability/Clinical Decision Making Stable/Uncomplicated    Clinical Decision Making Low    PT Frequency 2x / week    PT Duration 6 weeks    PT Treatment/Interventions ADLs/Self Care Home Management;Cryotherapy;Electrical Stimulation;Iontophoresis 70m/ml Dexamethasone;Moist Heat;Ultrasound;Therapeutic exercise;Therapeutic activities;Functional mobility training;Patient/family education;Manual techniques;Joint Manipulations;Passive range of motion;Taping    PT Next Visit Plan Assess response to HEP and education. Use of modalities and manual techniques as indicated    PT Home Exercise Plan [id number] and SLR c ER added to HEP    Consulted and Agree with Plan of Care Patient             Patient will benefit from skilled therapeutic intervention in order to improve the following deficits and impairments:  Difficulty walking,Obesity,Pain,Decreased strength,Impaired flexibility  Visit Diagnosis: Pain in left hip  Difficulty in walking, not elsewhere classified  Muscle weakness (generalized)     Problem List Patient Active Problem List   Diagnosis Date Noted   Positive ANA (antinuclear antibody) 07/18/2020   Bilateral knee pain 07/18/2020   Pain in left hip 07/18/2020   HTN (hypertension) 03/27/2020   HLD (hyperlipidemia) 09/29/2015   Obesity (BMI 30-39.9) 09/29/2015   ADD (attention deficit disorder) 11/07/2013   Trigger index finger of left hand 06/28/2011    AGar PontoMS, PT 07/26/20 7:43 AM  [facility] [address]Phone: [phone]  Fax:  [phone] Name: Alexis COVENMRN: [id number] of Birth: 712/26/1992 PHYSICAL THERAPY DISCHARGE SUMMARY  Visits from Start of Care: 4  Current functional level related to goals / functional outcomes: Unknown   Remaining  deficits: Unknown    Education / Equipment: HEP  Patient agrees to discharge. Patient goals were partially met. Patient is being discharged due to not returning since the last visit.    MS, PT 12/19/20 8:38 AM

## 2020-07-29 ENCOUNTER — Ambulatory Visit: Payer: Medicaid Other

## 2020-08-01 ENCOUNTER — Ambulatory Visit: Payer: Medicaid Other

## 2020-08-02 ENCOUNTER — Telehealth: Payer: Self-pay

## 2020-08-02 NOTE — Telephone Encounter (Signed)
LVM re: no show appt for 3/54/65, attendance policy, and reminder of upcoming appt on 08/05/20.

## 2020-08-05 ENCOUNTER — Ambulatory Visit: Payer: Medicaid Other

## 2020-08-10 ENCOUNTER — Other Ambulatory Visit: Payer: Self-pay | Admitting: Internal Medicine

## 2020-08-11 DIAGNOSIS — R000 Tachycardia, unspecified: Secondary | ICD-10-CM | POA: Diagnosis not present

## 2020-08-11 DIAGNOSIS — J019 Acute sinusitis, unspecified: Secondary | ICD-10-CM | POA: Diagnosis not present

## 2020-08-23 ENCOUNTER — Ambulatory Visit
Admission: EM | Admit: 2020-08-23 | Discharge: 2020-08-23 | Disposition: A | Discharge status: Home / Self Care | Payer: Medicaid Other | Attending: Family Medicine | Admitting: Family Medicine

## 2020-08-23 ENCOUNTER — Ambulatory Visit: Admit: 2020-08-23 | Disposition: A | Discharge status: Home / Self Care | Payer: Medicaid Other

## 2020-08-23 ENCOUNTER — Other Ambulatory Visit: Payer: Self-pay

## 2020-08-23 DIAGNOSIS — B370 Candidal stomatitis: Secondary | ICD-10-CM | POA: Insufficient documentation

## 2020-08-23 LAB — POCT RAPID STREP A (OFFICE): Rapid Strep A Screen: NEGATIVE

## 2020-08-23 MED ORDER — FLUCONAZOLE 150 MG PO TABS: 150.0000 mg | ORAL_TABLET | ORAL | 0 refills | Status: AC

## 2020-08-23 MED ORDER — NYSTATIN 100000 UNIT/ML MT SUSP: 500000.0000 [IU] | Freq: Four times a day (QID) | OROMUCOSAL | 0 refills | Status: AC

## 2020-08-23 NOTE — ED Provider Notes (Signed)
[name]    CSN: [id number] Arrival date & time: 08/23/20  1731      History   Chief Complaint Chief Complaint  Patient presents with  . Sore Throat    HPI Alexis Ellis is a 30 y.o. female.   HPI  Patient presents today for evaluation of sore throat.  Patient treated 2 weeks ago at another urgent care for sore throat and sinus infection with antibiotics and steroids.  Patient reported sore throat got better and nasal symptoms improved although did not resolve.  She awakened today with severe soreness of throat and cervical adenopathy.  She had a negative flu and strep test.  She completed a course of steroids and had 1 remaining dose left of amoxicillin. She is afebrile.  Past Medical History:  Diagnosis Date  . ADD (attention deficit disorder)   . Chicken pox   . Depression   . Diet controlled gestational diabetes mellitus (GDM) in third trimester 03/23/2019  . Ectopic pregnancy   . Elevated blood pressure   . Elevated cholesterol   . Frequent headaches   . Gestational hypertension 04/19/2019  . History of kidney stones   . Pregnancy induced hypertension     Patient Active Problem List   Diagnosis Date Noted  . Positive ANA (antinuclear antibody) 07/18/2020  . Bilateral knee pain 07/18/2020  . Pain in left hip 07/18/2020  . HTN (hypertension) 03/27/2020  . HLD (hyperlipidemia) 09/29/2015  . Obesity (BMI 30-39.9) 09/29/2015  . ADD (attention deficit disorder) 11/07/2013  . Trigger index finger of left hand 06/28/2011    Past Surgical History:  Procedure Laterality Date  . LAPAROSCOPY N/A 09/16/2012   Procedure: LAPAROSCOPY OPERATIVE;  Surgeon: Allena Katz, MD;  Location: [facility];  Service: Gynecology;  Laterality: N/A;  Evacuation Hemoperitoneum  . TUBAL LIGATION  2014  . UNILATERAL SALPINGECTOMY Right 09/16/2012   Procedure: UNILATERAL SALPINGECTOMY;  Surgeon: Allena Katz, MD;  Location: [facility];  Service: Gynecology;  Laterality:  Right;  . WISDOM TOOTH EXTRACTION      OB History    Gravida  5   Para  1   Term      Preterm  1   AB  4   Living  1     SAB  3   IAB      Ectopic  1   Multiple  0   Live Births  1            Home Medications    Prior to Admission medications   Medication Sig Start Date End Date Taking? Authorizing Provider  amphetamine-dextroamphetamine (ADDERALL) 20 MG tablet Take 1 tablet by mouth 4 (four) times daily. 03/03/20   [provider]  clonazePAM (KLONOPIN) 1 MG tablet Take 1 mg by mouth 2 (two) times daily as needed. 03/03/20   [provider]  IBUPROFEN PO Take by mouth as needed.    [provider]  lisinopril-hydrochlorothiazide (ZESTORETIC) 10-12.5 MG tablet TAKE 1 TABLET BY MOUTH EVERY DAY 08/11/20   Jearld Fenton, NP    Family History Family History  Problem Relation Age of Onset  . Hypertension Mother        Living  . Fibromyalgia Mother   . Diabetes Father 52       Deceased  . Heart attack Father   . Sudden death Father   . Bipolar disorder Maternal Grandmother   . Diabetes Maternal Grandfather   . Alcohol abuse Paternal  Grandfather   . Liver disease Paternal Grandfather   . Hyperlipidemia Paternal Grandmother   . Kidney disease Paternal Grandmother   . Liver disease Paternal Grandmother   . Heart disease Paternal Grandmother   . Healthy Sister   . Healthy Son     Social History Social History   Tobacco Use  . Smoking status: Former Smoker    Packs/day: 0.00    Types: Cigarettes    Quit date: 2020    Years since quitting: 2.2  . Smokeless tobacco: Never Used  Vaping Use  . Vaping Use: Never used  Substance Use Topics  . Alcohol use: Not Currently    Alcohol/week: 0.0 standard drinks  . Drug use: No     Allergies   Patient has no known allergies.   Review of Systems Review of Systems Pertinent negatives listed in HPI'  Physical Exam Triage Vital Signs ED Triage Vitals  Enc Vitals Group      BP 08/23/20 1859 121/84     Pulse Rate 08/23/20 1859 (!) 111     Resp 08/23/20 1859 18     Temp 08/23/20 1859 98.1 F (36.7 C)     Temp Source 08/23/20 1859 Oral     SpO2 08/23/20 1859 98 %     Weight --      Height --      Head Circumference --      Peak Flow --      Pain Score 08/23/20 1855 6     Pain Loc --      Pain Edu? --      Excl. in [place]? --    No data found.  Updated Vital Signs BP 121/84 (BP Location: Left Arm)   Pulse (!) 111   Temp 98.1 F (36.7 C) (Oral)   Resp 18   LMP 08/20/2020   SpO2 98%   Breastfeeding No   Visual Acuity Right Eye Distance:   Left Eye Distance:   Bilateral Distance:    Right Eye Near:   Left Eye Near:    Bilateral Near:     Physical Exam Constitutional:      Appearance: She is obese.  HENT:     Head: Normocephalic.     Right Ear: Tympanic membrane normal.     Left Ear: Tympanic membrane normal.     Nose: Congestion present. No rhinorrhea.     Mouth/Throat:     Mouth: Mucous membranes are moist.     Comments: O/P erythematous with leukoplakia   Eyes:     Conjunctiva/sclera: Conjunctivae normal.     Pupils: Pupils are equal, round, and reactive to light.  Cardiovascular:     Rate and Rhythm: Normal rate and regular rhythm.  Musculoskeletal:     Cervical back: Normal range of motion.  Lymphadenopathy:     Cervical: Cervical adenopathy present.  Skin:    General: Skin is warm.     Capillary Refill: Capillary refill takes less than 2 seconds.  Neurological:     General: No focal deficit present.     Mental Status: She is alert and oriented to person, place, and time.  Psychiatric:        Mood and Affect: Mood normal.    UC Treatments / Results  Labs (all labs ordered are listed, but only abnormal results are displayed) Labs Reviewed  POCT RAPID STREP A (OFFICE)    EKG   Radiology No results found.  Procedures Procedures (including critical care time)  Medications  Ordered in UC Medications - No data to  display  Initial Impression / Assessment and Plan / UC Course  I have reviewed the triage vital signs and the nursing notes.  Pertinent labs & imaging results that were available during my care of the patient were reviewed by me and considered in my medical decision making (see chart for details).    POCT strep negative. Throat culture pending. Patient completing antibiotics which are sensitive to strep decreasing likelihood of an active strep infection. Appear of O/P more consistent with fungal etiology and consistent with thrush. Treatment per discharge medication orders. Follow-up with PCP as needed. Final Clinical Impressions(s) / UC Diagnoses   Final diagnoses:  Thrush, oral   Discharge Instructions   None    ED Prescriptions    Medication Sig Dispense Auth. Provider   fluconazole (DIFLUCAN) 150 MG tablet Take 1 tablet (150 mg total) by mouth every 3 (three) days for 4 doses. Repeat if needed 4 tablet Scot Jun, FNP   nystatin (MYCOSTATIN) 100000 UNIT/ML suspension Take 5 mLs (500,000 Units total) by mouth 4 (four) times daily for 10 days. 120 mL Scot Jun, FNP     PDMP not reviewed this encounter.   Scot Jun, [place] 08/27/20 [phone]

## 2020-08-23 NOTE — ED Triage Notes (Signed)
Pt states seen at [facility] 2wks ago for sore throat. Had neg strep and flu, tx'd with antibiotics and steroids for a sinus infection. States throat got slightly better but today feels like her throat is tight and now having swelling to neck and lymphoids. No distress noted.

## 2020-08-27 LAB — CULTURE, GROUP A STREP (THRC)

## 2020-09-05 ENCOUNTER — Ambulatory Visit: Payer: Self-pay | Admitting: Internal Medicine

## 2020-09-05 NOTE — Progress Notes (Deleted)
Subjective:    Patient ID: Alexis Ellis, female    DOB: Mar 21, 1991, 30 y.o.   MRN: [id number]  HPI  Patient presents the clinic today for urgent care follow-up.  She went to urgent care 4/13 with complaint of sore throat and swollen glands.  She had been treated for a sinus infection just prior to that with Prednisone and antibiotics.  She was diagnosed with oral thrush, treated with oral Diflucan and Nystatin solution.  Since that time.  Review of Systems      Past Medical History:  Diagnosis Date  . ADD (attention deficit disorder)   . Chicken pox   . Depression   . Diet controlled gestational diabetes mellitus (GDM) in third trimester 03/23/2019  . Ectopic pregnancy   . Elevated blood pressure   . Elevated cholesterol   . Frequent headaches   . Gestational hypertension 04/19/2019  . History of kidney stones   . Pregnancy induced hypertension     Current Outpatient Medications  Medication Sig Dispense Refill  . amphetamine-dextroamphetamine (ADDERALL) 20 MG tablet Take 1 tablet by mouth 4 (four) times daily.    . clonazePAM (KLONOPIN) 1 MG tablet Take 1 mg by mouth 2 (two) times daily as needed.    . IBUPROFEN PO Take by mouth as needed.    Marland Kitchen lisinopril-hydrochlorothiazide (ZESTORETIC) 10-12.5 MG tablet TAKE 1 TABLET BY MOUTH EVERY DAY 90 tablet 0   No current facility-administered medications for this visit.    No Known Allergies  Family History  Problem Relation Age of Onset  . Hypertension Mother        Living  . Fibromyalgia Mother   . Diabetes Father 69       Deceased  . Heart attack Father   . Sudden death Father   . Bipolar disorder Maternal Grandmother   . Diabetes Maternal Grandfather   . Alcohol abuse Paternal Grandfather   . Liver disease Paternal Grandfather   . Hyperlipidemia Paternal Grandmother   . Kidney disease Paternal Grandmother   . Liver disease Paternal Grandmother   . Heart disease Paternal Grandmother   . Healthy Sister   .  Healthy Son     Social History   Socioeconomic History  . Marital status: Single    Spouse name: Not on file  . Number of children: Not on file  . Years of education: Not on file  . Highest education level: Not on file  Occupational History  . Not on file  Tobacco Use  . Smoking status: Former Smoker    Packs/day: 0.00    Types: Cigarettes    Quit date: 2020    Years since quitting: 2.3  . Smokeless tobacco: Never Used  Vaping Use  . Vaping Use: Never used  Substance and Sexual Activity  . Alcohol use: Not Currently    Alcohol/week: 0.0 standard drinks  . Drug use: No  . Sexual activity: Yes    Partners: Male    Birth control/protection: None  Other Topics Concern  . Not on file  Social History Narrative  . Not on file   Social Determinants of Health   Financial Resource Strain: Not on file  Food Insecurity: Not on file  Transportation Needs: Not on file  Physical Activity: Not on file  Stress: Not on file  Social Connections: Not on file  Intimate Partner Violence: Not on file     Constitutional: Denies fever, malaise, fatigue, headache or abrupt weight changes.  HEENT: Denies eye pain,  eye redness, ear pain, ringing in the ears, wax buildup, runny nose, nasal congestion, bloody nose, or sore throat. Respiratory: Denies difficulty breathing, shortness of breath, cough or sputum production.   Cardiovascular: Denies chest pain, chest tightness, palpitations or swelling in the hands or feet.  Gastrointestinal: Denies abdominal pain, bloating, constipation, diarrhea or blood in the stool.  GU: Denies urgency, frequency, pain with urination, burning sensation, blood in urine, odor or discharge. Musculoskeletal: Denies decrease in range of motion, difficulty with gait, muscle pain or joint pain and swelling.  Skin: Denies redness, rashes, lesions or ulcercations.  Neurological: Denies dizziness, difficulty with memory, difficulty with speech or problems with balance  and coordination.  Psych: Denies anxiety, depression, SI/HI.  No other specific complaints in a complete review of systems (except as listed in HPI above).  Objective:   Physical Exam   LMP 08/20/2020  Wt Readings from Last 3 Encounters:  07/18/20 239 lb 3.2 oz (108.5 kg)  06/06/20 238 lb (108 kg)  04/12/20 236 lb (107 kg)    General: Appears their stated age, well developed, well nourished in NAD. Skin: Warm, dry and intact. No rashes, lesions or ulcerations noted. HEENT: Head: normal shape and size; Eyes: sclera white, no icterus, conjunctiva pink, PERRLA and EOMs intact; Ears: Tm's gray and intact, normal light reflex; Nose: mucosa pink and moist, septum midline; Throat/Mouth: Teeth present, mucosa pink and moist, no exudate, lesions or ulcerations noted.  Neck:  Neck supple, trachea midline. No masses, lumps or thyromegaly present.  Cardiovascular: Normal rate and rhythm. S1,S2 noted.  No murmur, rubs or gallops noted. No JVD or BLE edema. No carotid bruits noted. Pulmonary/Chest: Normal effort and positive vesicular breath sounds. No respiratory distress. No wheezes, rales or ronchi noted.  Abdomen: Soft and nontender. Normal bowel sounds. No distention or masses noted. Liver, spleen and kidneys non palpable. Musculoskeletal: Normal range of motion. No signs of joint swelling. No difficulty with gait.  Neurological: Alert and oriented. Cranial nerves II-XII grossly intact. Coordination normal.  Psychiatric: Mood and affect normal. Behavior is normal. Judgment and thought content normal.   EKG:  BMET    Component Value Date/Time   NA 139 03/27/2020 1227   K 4.1 03/27/2020 1227   CL 101 03/27/2020 1227   CO2 30 03/27/2020 1227   GLUCOSE 79 03/27/2020 1227   BUN 12 03/27/2020 1227   CREATININE 0.82 03/27/2020 1227   CREATININE 1.00 06/05/2015 2228   CALCIUM 9.8 03/27/2020 1227   GFRNONAA >60 05/01/2019 1723   GFRAA >60 05/01/2019 1723    Lipid Panel     Component  Value Date/Time   CHOL 234 (H) 03/27/2020 1227   TRIG 134.0 03/27/2020 1227   HDL 43.20 03/27/2020 1227   CHOLHDL 5 03/27/2020 1227   VLDL 26.8 03/27/2020 1227   LDLCALC 164 (H) 03/27/2020 1227    CBC    Component Value Date/Time   WBC 15.1 (H) 05/01/2019 1723   RBC 4.44 05/01/2019 1723   HGB 12.5 05/01/2019 1723   HGB 12.7 03/22/2019 0930   HCT 37.9 05/01/2019 1723   HCT 37.7 03/22/2019 0930   PLT 282 05/01/2019 1723   PLT 280 03/22/2019 0930   MCV 85.4 05/01/2019 1723   MCV 84 03/22/2019 0930   MCH 28.2 05/01/2019 1723   MCHC 33.0 05/01/2019 1723   RDW 13.4 05/01/2019 1723   RDW 12.8 03/22/2019 0930   LYMPHSABS 2.3 11/30/2018 0955   MONOABS 0.6 11/01/2013 1051   EOSABS 0.1 11/30/2018  0955   BASOSABS 0.0 11/30/2018 0955    Hgb A1C Lab Results  Component Value Date   HGBA1C 5.3 03/27/2020          Assessment & Plan:   UC follow-up for Sore Throat, Oral Thrush:  Urgent care note reviewed  Return precautions discussed Webb Silversmith, NP This visit occurred during the SARS-CoV-2 public health emergency.  Safety protocols were in place, including screening questions prior to the visit, additional usage of staff PPE, and extensive cleaning of exam room while observing appropriate contact time as indicated for disinfecting solutions.

## 2020-09-07 ENCOUNTER — Emergency Department (HOSPITAL_BASED_OUTPATIENT_CLINIC_OR_DEPARTMENT_OTHER): Payer: Medicaid Other

## 2020-09-07 ENCOUNTER — Emergency Department (HOSPITAL_COMMUNITY)
Admission: EM | Admit: 2020-09-07 | Discharge: 2020-09-07 | Disposition: A | Discharge status: Home / Self Care | Payer: Medicaid Other | Attending: Emergency Medicine | Admitting: Emergency Medicine

## 2020-09-07 ENCOUNTER — Encounter (HOSPITAL_COMMUNITY): Payer: Self-pay | Admitting: Emergency Medicine

## 2020-09-07 ENCOUNTER — Other Ambulatory Visit: Payer: Self-pay

## 2020-09-07 ENCOUNTER — Emergency Department (HOSPITAL_COMMUNITY): Payer: Medicaid Other

## 2020-09-07 DIAGNOSIS — M25531 Pain in right wrist: Secondary | ICD-10-CM | POA: Diagnosis not present

## 2020-09-07 DIAGNOSIS — M79641 Pain in right hand: Secondary | ICD-10-CM

## 2020-09-07 DIAGNOSIS — M7989 Other specified soft tissue disorders: Secondary | ICD-10-CM | POA: Diagnosis not present

## 2020-09-07 DIAGNOSIS — I10 Essential (primary) hypertension: Secondary | ICD-10-CM | POA: Diagnosis not present

## 2020-09-07 DIAGNOSIS — Z79899 Other long term (current) drug therapy: Secondary | ICD-10-CM | POA: Diagnosis not present

## 2020-09-07 DIAGNOSIS — Z87891 Personal history of nicotine dependence: Secondary | ICD-10-CM | POA: Insufficient documentation

## 2020-09-07 NOTE — ED Provider Notes (Signed)
[facility] Provider Note   CSN: [id number] Arrival date & time: 09/07/20  1308     History Chief Complaint  Patient presents with  . Arm Pain    Alexis Ellis is a 30 y.o. female with history of hypertension, hyperlipidemia, attention deficit disorder.  Patient presents with chief complaint of right hand pain and swelling.  Patient reports that last night just prior to going to sleep she had tingling sensation in her right hand became swollen and hard to bend.  Upon waking this morning tingling sensation had resolved however she still noticed swelling and pain.  Patient rates her pain 5/10 on the pain scale.  Pain is worse with movement.  No alleviating factors.  Pain is primarily very to the dorsum of her hand.  Patient denies any previous history of similar episodes.  Patient denies any recent injuries.  Patient denies any IV drug use.    Patient denies any fevers, chills, weakness, numbness, wound, rash, color change.  HPI     Past Medical History:  Diagnosis Date  . ADD (attention deficit disorder)   . Chicken pox   . Depression   . Diet controlled gestational diabetes mellitus (GDM) in third trimester 03/23/2019  . Ectopic pregnancy   . Elevated blood pressure   . Elevated cholesterol   . Frequent headaches   . Gestational hypertension 04/19/2019  . History of kidney stones   . Pregnancy induced hypertension     Patient Active Problem List   Diagnosis Date Noted  . HTN (hypertension) 03/27/2020  . HLD (hyperlipidemia) 09/29/2015  . ADD (attention deficit disorder) 11/07/2013    Past Surgical History:  Procedure Laterality Date  . LAPAROSCOPY N/A 09/16/2012   Procedure: LAPAROSCOPY OPERATIVE;  Surgeon: Allena Katz, MD;  Location: [facility];  Service: Gynecology;  Laterality: N/A;  Evacuation Hemoperitoneum  . TUBAL LIGATION  2014  . UNILATERAL SALPINGECTOMY Right 09/16/2012   Procedure: UNILATERAL SALPINGECTOMY;  Surgeon:  Allena Katz, MD;  Location: [facility];  Service: Gynecology;  Laterality: Right;  . WISDOM TOOTH EXTRACTION       OB History    Gravida  5   Para  1   Term      Preterm  1   AB  4   Living  1     SAB  3   IAB      Ectopic  1   Multiple  0   Live Births  1           Family History  Problem Relation Age of Onset  . Hypertension Mother        Living  . Fibromyalgia Mother   . Diabetes Father 39       Deceased  . Heart attack Father   . Sudden death Father   . Bipolar disorder Maternal Grandmother   . Diabetes Maternal Grandfather   . Alcohol abuse Paternal Grandfather   . Liver disease Paternal Grandfather   . Hyperlipidemia Paternal Grandmother   . Kidney disease Paternal Grandmother   . Liver disease Paternal Grandmother   . Heart disease Paternal Grandmother   . Healthy Sister   . Healthy Son     Social History   Tobacco Use  . Smoking status: Former Smoker    Packs/day: 0.00    Types: Cigarettes    Quit date: 2020    Years since quitting: 2.3  . Smokeless tobacco: Never Used  Vaping  Use  . Vaping Use: Never used  Substance Use Topics  . Alcohol use: Not Currently    Alcohol/week: 0.0 standard drinks  . Drug use: No    Home Medications Prior to Admission medications   Medication Sig Start Date End Date Taking? Authorizing Provider  amphetamine-dextroamphetamine (ADDERALL) 20 MG tablet Take 1 tablet by mouth 4 (four) times daily. 03/03/20   [provider]  clonazePAM (KLONOPIN) 1 MG tablet Take 1 mg by mouth 2 (two) times daily as needed. 03/03/20   [provider]  IBUPROFEN PO Take by mouth as needed.    [provider]  lisinopril-hydrochlorothiazide (ZESTORETIC) 10-12.5 MG tablet TAKE 1 TABLET BY MOUTH EVERY DAY 08/11/20   Jearld Fenton, NP    Allergies    Patient has no known allergies.  Review of Systems   Review of Systems  Constitutional: Negative for chills and fever.  Skin: Negative for  color change, pallor, rash and wound.  Neurological: Negative for weakness and numbness.    Physical Exam Updated Vital Signs BP 124/81 (BP Location: Left Arm)   Pulse 97   Temp 98.9 F (37.2 C) (Oral)   Resp 15   Ht 5\' 5"  (1.651 m)   Wt 106.1 kg   LMP 08/20/2020   SpO2 100%   BMI 38.94 kg/m   Physical Exam Vitals and nursing note reviewed.  Constitutional:      General: She is not in acute distress.    Appearance: She is not ill-appearing, toxic-appearing or diaphoretic.  HENT:     Head: Normocephalic.  Eyes:     General: No scleral icterus.       Right eye: No discharge.        Left eye: No discharge.  Cardiovascular:     Rate and Rhythm: Normal rate.  Pulmonary:     Effort: Pulmonary effort is normal. No respiratory distress.     Breath sounds: No stridor.  Musculoskeletal:     Right hand: Swelling and tenderness present. No deformity, lacerations or bony tenderness. Normal range of motion. Normal strength. Normal sensation. Normal capillary refill.     Left hand: No swelling.     Cervical back: Normal range of motion.     Comments: Patient has minimal swelling noted to dorsum of her right hand.  Tenderness throughout dorsum of right hand.  Patient able to fully extend all digits of right hand.  Patient has decreased flexion to digits of right hand due to complaints of pain.  Sensation intact to all digits of right hand.  Cap refill 2 to 3 seconds in all digits of right hand. +3 right radial pulse.  Grip strength equal in bilateral hands.  Erythema is noted to bilateral hands and forearms.  Patient reports this is baseline for her and has not noticed any changes.       Skin:    General: Skin is warm and dry.  Neurological:     General: No focal deficit present.     Mental Status: She is alert.  Psychiatric:        Behavior: Behavior is cooperative.     ED Results / Procedures / Treatments   Labs (all labs ordered are listed, but only abnormal results are  displayed) Labs Reviewed - No data to display  EKG None  Radiology DG Wrist Complete Right  Result Date: 09/07/2020 CLINICAL DATA:  Right wrist pain.  No known injury. EXAM: RIGHT WRIST - COMPLETE 3+ VIEW COMPARISON:  None.  FINDINGS: There is no evidence of fracture or dislocation. There is no evidence of arthropathy or other focal bone abnormality. Soft tissues are unremarkable. IMPRESSION: Negative. Electronically Signed   By: Davina Poke D.O.   On: 09/07/2020 14:36   UE Venous Duplex ([facility] and [facility] ONLY)  Result Date: 09/07/2020 UPPER VENOUS STUDY  Patient Name:  Alexis Ellis  Date of Exam:   09/07/2020 Medical Rec #: [id number]         Accession #:    [id number] Date of Birth: [birth year]         Patient Gender: F Patient Age:   29Y Exam Location:  [facility] Procedure:      VAS [place] UPPER EXTREMITY VENOUS DUPLEX Referring Phys: [id number] [place] --------------------------------------------------------------------------------  Indications: Pain and swelling of right hand Comparison Study: No previous exams Performing Technologist: Rogelia Rohrer  Examination Guidelines: A complete evaluation includes B-mode imaging, spectral Doppler, color Doppler, and power Doppler as needed of all accessible portions of each vessel. Bilateral testing is considered an integral part of a complete examination. Limited examinations for reoccurring indications may be performed as noted.  Right Findings: +----------+------------+---------+-----------+----------+-------+ RIGHT     CompressiblePhasicitySpontaneousPropertiesSummary +----------+------------+---------+-----------+----------+-------+ IJV           Full       Yes       Yes                      +----------+------------+---------+-----------+----------+-------+ Subclavian    Full       Yes       Yes                      +----------+------------+---------+-----------+----------+-------+ Axillary      Full       Yes       Yes                       +----------+------------+---------+-----------+----------+-------+ Brachial      Full       Yes       Yes                      +----------+------------+---------+-----------+----------+-------+ Radial        Full                                          +----------+------------+---------+-----------+----------+-------+ Ulnar         Full                                          +----------+------------+---------+-----------+----------+-------+ Cephalic      Full                                          +----------+------------+---------+-----------+----------+-------+ Basilic       Full       Yes       Yes                      +----------+------------+---------+-----------+----------+-------+  Left Findings: +----------+------------+---------+-----------+----------+-------+ LEFT      CompressiblePhasicitySpontaneousPropertiesSummary +----------+------------+---------+-----------+----------+-------+ Subclavian    Full  Yes       Yes                      +----------+------------+---------+-----------+----------+-------+  Summary:  Right: No evidence of deep vein thrombosis in the upper extremity. No evidence of superficial vein thrombosis in the upper extremity.  Left: No evidence of thrombosis in the subclavian.  *See table(s) above for measurements and observations.    Preliminary     Procedures Procedures   Medications Ordered in ED Medications - No data to display  ED Course  I have reviewed the triage vital signs and the nursing notes.  Pertinent labs & imaging results that were available during my care of the patient were reviewed by me and considered in my medical decision making (see chart for details).    MDM Rules/Calculators/A&P                          Alert 30 year old female no acute distress, nontoxic-appearing.  Patient presents with chief complaint of swelling and pain to her right hand.  Reports that  regarding swelling and pain are to dorsum.  Patient denies any injuries.  Patient is right-hand dominant.  X-ray of wrist obtained while patient was in triage. X-ray shows no evidence of fracture or dislocation.  No evidence of arthropathy or other focal bone abnormality.  Soft tissues are unremarkable.  On physical exam patient has right +3 radial pulse.  ensation intact to all digits of right hand.  Cap refill 2 to 3 seconds in all digits of right hand.  Patient able to fully extend all digits.  Decreased flexion due to complaints of pain.  Tenderness and minimal swelling throughout dorsum of right hand.  Erythema is noted to bilateral hands and forearms; patient reports this is baseline for her and has not noticed any changes.  Low suspicion for cellulitis as erythema is at baseline for patient and there is no warmth appreciated.  Will obtain ultrasound of right upper extremity to evaluate for possible DVT. Ultrasound imaging showed no evidence of DVT in the upper right extremity.  No evidence of superficial vein thrombosis in the upper right extremity.  No evidence of thrombosis in the left subclavian.  Patient advised to use over-the-counter pain medication as needed to help with pain.  Advised to elevate and ice her hand to help reduce swelling.  Patient advised to follow-up with her primary care provider if symptoms do not improve.  Discussed results, findings, treatment and follow up. Patient advised of return precautions. Patient verbalized understanding and agreed with plan.    Final Clinical Impression(s) / ED Diagnoses Final diagnoses:  Right hand pain    Rx / DC Orders ED Discharge Orders    None       Dyann Ruddle 09/08/20 1028    Gareth Morgan, MD 09/08/20 1531

## 2020-09-07 NOTE — ED Provider Notes (Signed)
Emergency Medicine Provider Triage Evaluation Note  [name], a 30 y.o. female evaluated in triage.  Pt complains of right hand pain.  Went to bed last night with tingling right hand, swollen, hard to bend hand. Painful. No injuries. No hx gout, no hx IVDU use. No recent injections in this arm.   BP 128/84 (BP Location: Left Arm)   Pulse (!) 104   Temp 98.9 F (37.2 C) (Oral)   Resp 18   Ht 5\' 5"  (1.651 m)   Wt 106.1 kg   LMP 08/20/2020   SpO2 100%   BMI 38.94 kg/m   Patient is alert, no acute distress, normal work of breathing Generalized tenderness, worse to the dorsum of the right hand, wrist, forearm.  Pain with any range of motion.  Mild swelling noted.  2 to 3-second cap refill though this is equal to left hand.  Strong radial pulse.  Normal sensation    Medically screening exam initiated at 1:21 PM. Appropriate orders placed.  [name] was informed that the remainder of the evaluation will be completed by another provider, this initial triage assessment does not replace that evaluation, and the importance of remaining in the ED until their evaluation is complete.       Paydon Carll, [place] N, PA-C 09/07/20 1336    Carmin Muskrat, MD 09/15/20 1616

## 2020-09-07 NOTE — Progress Notes (Signed)
RUE venous duplex has been completed.  Preliminary results given to Debbe Mounts, [place] at [phone]

## 2020-09-07 NOTE — ED Triage Notes (Addendum)
Pt states just before she went to bed her right hand started feeling tingly, swollen, painful, cold and hard to bend her fingers. Denies any injury. This has persisted throughout today.

## 2020-09-07 NOTE — ED Notes (Signed)
US at bedside

## 2020-09-07 NOTE — Discharge Instructions (Addendum)
You came to the emergency department today to be evaluated for your right hand pain and swelling.  Your x-ray showed no broken bones, dislocations, or swelling to the soft tissues.  The ultrasound of your right upper arm showed no blood clots.  Please elevate and ice your hand to help reduce swelling.  You may use over-the-counter pain medication as needed to help reduce your pain.  If your symptoms do not improve or worsen please follow-up with your primary care provider.  Please take Ibuprofen (Advil, motrin) and Tylenol (acetaminophen) to relieve your pain.    You may take up to 600 MG (3 pills) of normal strength ibuprofen every 8 hours as needed.   You make take tylenol, up to 1,000 mg (two extra strength pills) every 8 hours as needed.   It is safe to take ibuprofen and tylenol at the same time as they work differently.   Do not take more than 3,000 mg tylenol in a 24 hour period (not more than one dose every 8 hours.  Please check all medication labels as many medications such as pain and cold medications may contain tylenol.  Do not drink alcohol while taking these medications.  Do not take other NSAID'S while taking ibuprofen (such as aleve or naproxen).  Please take ibuprofen with food to decrease stomach upset.  Get help right away if: Your hand becomes warm, red, or swollen. Your hand is numb or tingling. Your hand is extremely swollen or deformed. Your hand or fingers turn white or blue. You cannot move your hand, wrist, or fingers.

## 2020-10-12 ENCOUNTER — Other Ambulatory Visit: Payer: Self-pay

## 2020-10-12 ENCOUNTER — Encounter: Payer: Self-pay | Admitting: Internal Medicine

## 2020-10-12 ENCOUNTER — Ambulatory Visit: Payer: Medicaid Other | Admitting: Internal Medicine

## 2020-10-12 VITALS — BP 128/74 | HR 108 | Temp 96.8°F | Resp 17 | Ht 65.0 in | Wt 235.1 lb

## 2020-10-12 DIAGNOSIS — I10 Essential (primary) hypertension: Secondary | ICD-10-CM

## 2020-10-12 DIAGNOSIS — R590 Localized enlarged lymph nodes: Secondary | ICD-10-CM

## 2020-10-12 DIAGNOSIS — J301 Allergic rhinitis due to pollen: Secondary | ICD-10-CM | POA: Diagnosis not present

## 2020-10-12 DIAGNOSIS — Z6839 Body mass index (BMI) 39.0-39.9, adult: Secondary | ICD-10-CM

## 2020-10-12 NOTE — Assessment & Plan Note (Signed)
BP controlled today on Lisinopril HCT Reinforced DASH diet and exercise for weight loss Will monitor

## 2020-10-12 NOTE — Patient Instructions (Signed)

## 2020-10-12 NOTE — Progress Notes (Signed)
Subjective:    Patient ID: Alexis Ellis, female    DOB: November 18, 1990, 30 y.o.   MRN: [id number]  HPI  Pt presents to the clinic today for follow up of HTN. Her BP today is 128/74. She reports her BP has been running 140/90's. She is taking Lisinopril HCT as prescribed. ECG from 09/2015 reviewed.  She also reports facial pressure, runny nose, post nasal drip and swollen glands. She reports this started 3 days ago. She is blowing yellow mucous out of her nose. She denies difficulty swallowing but reports the mucous is so thick at times, it makes her gag. She denies ear pain, sore throat, or SOB. She denies fever or chills. She has not had sick contacts or exposure to Covid that she is aware of. She has not had her Covid vaccines. She has not taken a Covid test.   Review of Systems  Past Medical History:  Diagnosis Date  . ADD (attention deficit disorder)   . Chicken pox   . Depression   . Diet controlled gestational diabetes mellitus (GDM) in third trimester 03/23/2019  . Ectopic pregnancy   . Elevated blood pressure   . Elevated cholesterol   . Frequent headaches   . Gestational hypertension 04/19/2019  . History of kidney stones   . Pregnancy induced hypertension     Current Outpatient Medications  Medication Sig Dispense Refill  . amphetamine-dextroamphetamine (ADDERALL) 20 MG tablet Take 1 tablet by mouth 4 (four) times daily.    . clonazePAM (KLONOPIN) 1 MG tablet Take 1 mg by mouth 2 (two) times daily as needed.    . IBUPROFEN PO Take by mouth as needed.    Marland Kitchen lisinopril-hydrochlorothiazide (ZESTORETIC) 10-12.5 MG tablet TAKE 1 TABLET BY MOUTH EVERY DAY 90 tablet 0   No current facility-administered medications for this visit.    No Known Allergies  Family History  Problem Relation Age of Onset  . Hypertension Mother        Living  . Fibromyalgia Mother   . Diabetes Father 71       Deceased  . Heart attack Father   . Sudden death Father   . Bipolar disorder  Maternal Grandmother   . Diabetes Maternal Grandfather   . Alcohol abuse Paternal Grandfather   . Liver disease Paternal Grandfather   . Hyperlipidemia Paternal Grandmother   . Kidney disease Paternal Grandmother   . Liver disease Paternal Grandmother   . Heart disease Paternal Grandmother   . Healthy Sister   . Healthy Son     Social History   Socioeconomic History  . Marital status: Single    Spouse name: Not on file  . Number of children: Not on file  . Years of education: Not on file  . Highest education level: Not on file  Occupational History  . Not on file  Tobacco Use  . Smoking status: Former Smoker    Packs/day: 0.00    Types: Cigarettes    Quit date: 2020    Years since quitting: 2.4  . Smokeless tobacco: Never Used  Vaping Use  . Vaping Use: Never used  Substance and Sexual Activity  . Alcohol use: Not Currently    Alcohol/week: 0.0 standard drinks  . Drug use: No  . Sexual activity: Yes    Partners: Male    Birth control/protection: None  Other Topics Concern  . Not on file  Social History Narrative  . Not on file   Social Determinants of Health  Financial Resource Strain: Not on file  Food Insecurity: Not on file  Transportation Needs: Not on file  Physical Activity: Not on file  Stress: Not on file  Social Connections: Not on file  Intimate Partner Violence: Not on file     Constitutional: Pt reports frequent headaches. Denies fever, malaise, fatigue,  or abrupt weight changes.  HEENT: Pt reports facial pressure, runny nose, post nasal drip. Denies eye pain, eye redness, ear pain, ringing in the ears, wax buildup, nasal congestion, bloody nose, or sore throat. Respiratory: Denies difficulty breathing, shortness of breath, cough or sputum production.   Cardiovascular: Denies chest pain, chest tightness, palpitations or swelling in the hands or feet.  Skin: Denies redness, rashes, lesions or ulcercations.  Neurological: Denies dizziness,  difficulty with memory, difficulty with speech or problems with balance and coordination.    No other specific complaints in a complete review of systems (except as listed in HPI above).     Objective:   Physical Exam   BP 128/74 (BP Location: Right Arm, Patient Position: Sitting, Cuff Size: Large)   Pulse (!) 108   Temp (!) 96.8 F (36 C) (Temporal)   Resp 17   Ht 5\' 5"  (1.651 m)   Wt 235 lb 1.6 oz (106.6 kg)   SpO2 100%   BMI 39.12 kg/m   Wt Readings from Last 3 Encounters:  09/07/20 234 lb (106.1 kg)  07/18/20 239 lb 3.2 oz (108.5 kg)  06/06/20 238 lb (108 kg)    General: Appears her stated age, obese, in NAD. Skin: Warm, dry and intact. No rashes noted. HEENT: Head: normal shape and size; Eyes: sclera white and EOMs intact;  Neck:  Left anterior cervical adenopathy noted. Cardiovascular: Normal rate and rhythm. S1,S2 noted.  No murmur, rubs or gallops noted.  Pulmonary/Chest: Normal effort and positive vesicular breath sounds. No respiratory distress. No wheezes, rales or ronchi noted.  Musculoskeletal: No difficulty with gait.  Neurological: Alert and oriented.  Psychiatric: Mood and affect normal. Behavior is normal. Judgment and thought content normal.    BMET    Component Value Date/Time   NA 139 03/27/2020 1227   K 4.1 03/27/2020 1227   CL 101 03/27/2020 1227   CO2 30 03/27/2020 1227   GLUCOSE 79 03/27/2020 1227   BUN 12 03/27/2020 1227   CREATININE 0.82 03/27/2020 1227   CREATININE 1.00 06/05/2015 2228   CALCIUM 9.8 03/27/2020 1227   GFRNONAA >60 05/01/2019 1723   GFRAA >60 05/01/2019 1723    Lipid Panel     Component Value Date/Time   CHOL 234 (H) 03/27/2020 1227   TRIG 134.0 03/27/2020 1227   HDL 43.20 03/27/2020 1227   CHOLHDL 5 03/27/2020 1227   VLDL 26.8 03/27/2020 1227   LDLCALC 164 (H) 03/27/2020 1227    CBC    Component Value Date/Time   WBC 15.1 (H) 05/01/2019 1723   RBC 4.44 05/01/2019 1723   HGB 12.5 05/01/2019 1723   HGB  12.7 03/22/2019 0930   HCT 37.9 05/01/2019 1723   HCT 37.7 03/22/2019 0930   PLT 282 05/01/2019 1723   PLT 280 03/22/2019 0930   MCV 85.4 05/01/2019 1723   MCV 84 03/22/2019 0930   MCH 28.2 05/01/2019 1723   MCHC 33.0 05/01/2019 1723   RDW 13.4 05/01/2019 1723   RDW 12.8 03/22/2019 0930   LYMPHSABS 2.3 11/30/2018 0955   MONOABS 0.6 11/01/2013 1051   EOSABS 0.1 11/30/2018 0955   BASOSABS 0.0 11/30/2018 0955  Hgb A1C Lab Results  Component Value Date   HGBA1C 5.3 03/27/2020          Assessment & Plan:   Allergic Rhinitis:  Start Zyrtec and Flonase OTC x 10 days Notify me if there is not much improvement next week No indication for steroids or abx at this time  Anterior Cervical Lymphadenopathy:  Likely reactive Advised her to monitor for 3 weeks, if gets bigger or does not resolve to let me know Webb Silversmith, NP This visit occurred during the SARS-CoV-2 public health emergency.  Safety protocols were in place, including screening questions prior to the visit, additional usage of staff PPE, and extensive cleaning of exam room while observing appropriate contact time as indicated for disinfecting solutions.

## 2020-10-13 MED ORDER — FLUTICASONE PROPIONATE 50 MCG/ACT NA SUSP: 2.0000 | Freq: Every day | NASAL | 6 refills | Status: AC

## 2020-10-13 MED ORDER — CETIRIZINE HCL 10 MG PO TABS: 10.0000 mg | ORAL_TABLET | Freq: Every day | ORAL | 11 refills | Status: DC

## 2020-10-16 ENCOUNTER — Telehealth: Payer: Medicaid Other | Admitting: Family

## 2020-10-16 DIAGNOSIS — B373 Candidiasis of vulva and vagina: Secondary | ICD-10-CM | POA: Diagnosis not present

## 2020-10-16 DIAGNOSIS — B3731 Acute candidiasis of vulva and vagina: Secondary | ICD-10-CM

## 2020-10-16 MED ORDER — AMOXICILLIN-POT CLAVULANATE 875-125 MG PO TABS: 1.0000 | ORAL_TABLET | Freq: Two times a day (BID) | ORAL | 0 refills | Status: DC

## 2020-10-16 MED ORDER — FLUCONAZOLE 150 MG PO TABS: 150.0000 mg | ORAL_TABLET | ORAL | 0 refills | Status: DC | PRN

## 2020-10-16 NOTE — Addendum Note (Signed)
Addended by: Jearld Fenton on: 10/16/2020 08:14 AM   Modules accepted: Orders

## 2020-10-16 NOTE — Progress Notes (Signed)

## 2020-10-22 ENCOUNTER — Telehealth: Payer: Medicaid Other | Admitting: Nurse Practitioner

## 2020-10-22 DIAGNOSIS — R42 Dizziness and giddiness: Secondary | ICD-10-CM

## 2020-10-22 MED ORDER — MECLIZINE HCL 25 MG PO TABS: 25.0000 mg | ORAL_TABLET | Freq: Three times a day (TID) | ORAL | 0 refills | Status: AC | PRN

## 2020-10-22 NOTE — Progress Notes (Signed)
E Visit for Motion Sickness  We are sorry that you are not feeling well. Here is how we plan to help!  Based on what you have shared with me it looks like you have symptoms of motion sickness.  I have prescribed a medication that will help prevent or alleviate your symptoms:  Meclizine 25mg by mouth three times per day as needed for nausea/motion sickness   Prevention:  You might feel better if you keep your eyes focused on outside while you are in motion. For example, if you are in a car, sit in the front and look in the direction you are moving; if you are on a boat, stay on the deck and look to the horizon. This helps make what you see match the movement you are feeling, and so you are less likely to feel sick.  You should also avoid reading, watching a movie, texting or reading messages, or looking at things close to you inside the vehicle you are riding in.  Use the seat head rest. Lean your head against the back of the seat or head rest when traveling in vehicles with seats to minimize head movements.  On a ship: When making your reservations, choose a cabin in the middle of the ship and near the waterline. When on board, go up on deck and focus on the horizon.  In an airplane: Request a window seat and look out the window. A seat over the front edge of the wing is the most preferable spot (the degree of motion is the lowest here). Direct the air vent to blow cool air on your face.  On a train: Always face forward and sit near a window.  In a vehicle: Sit in the front seat; if you are the passenger, look at the scenery in the distance. For some people, driving the vehicle (rather than being a passenger) is an instant remedy.  Avoid others who have become nauseous with motion sickness. Seeing and smelling others who have motion sickness may cause you to become sick.  GET HELP RIGHT AWAY IF:  Your symptoms do not improve or worsen within 2 days after treatment.  You cannot keep  down fluids after trying the medication.  Other associated symptoms such as severe headache, visual field changes, fever, or intractable nausea and vomiting.  MAKE SURE YOU:  Understand these instructions. Will watch your condition. Will get help right away if you are not doing well or get worse.  Thank you for choosing an e-visit.  Your e-visit answers were reviewed by a board certified advanced clinical practitioner to complete your personal care plan. Depending upon the condition, your plan could have included both over the counter or prescription medications.  Please review your pharmacy choice. Be sure that the pharmacy you have chosen is open so that you can pick up your prescription now.  If there is a problem you may message your provider in MyChart to have the prescription routed to another pharmacy.  Your safety is important to us. If you have drug allergies check your prescription carefully.   For the next 24 hours, you can use MyChart to ask questions about today's visit, request a non-urgent call back, or ask for a work or school excuse from your e-visit provider.  You will get an e-mail in the next two days asking about your experience. I hope that your e-visit has been valuable and will speed your recovery.   References or for more information: https://wwwnc.cdc.gov/travel/yellowbook/2020/travel-by-air-land-sea/motion-sickness https://my.[facility].org/health/articles/12782-motion-sickness https://www.uptodate.com  

## 2020-11-01 ENCOUNTER — Encounter: Payer: Self-pay | Admitting: Internal Medicine

## 2020-11-01 DIAGNOSIS — R233 Spontaneous ecchymoses: Secondary | ICD-10-CM

## 2020-11-01 DIAGNOSIS — R238 Other skin changes: Secondary | ICD-10-CM

## 2020-11-03 ENCOUNTER — Other Ambulatory Visit: Payer: Medicaid Other

## 2020-11-03 ENCOUNTER — Other Ambulatory Visit: Payer: Self-pay

## 2020-11-03 DIAGNOSIS — R238 Other skin changes: Secondary | ICD-10-CM | POA: Diagnosis not present

## 2020-11-03 LAB — CBC WITH DIFFERENTIAL/PLATELET
Absolute Monocytes: 570 cells/uL (ref 200–950)
Basophils Absolute: 62 cells/uL (ref 0–200)
Basophils Relative: 0.7 %
Eosinophils Absolute: 187 cells/uL (ref 15–500)
Eosinophils Relative: 2.1 %
HCT: 47.1 % — ABNORMAL HIGH (ref 35.0–45.0)
Hemoglobin: 15.6 g/dL — ABNORMAL HIGH (ref 11.7–15.5)
Lymphs Abs: 3338 cells/uL (ref 850–3900)
MCH: 29.4 pg (ref 27.0–33.0)
MCHC: 33.1 g/dL (ref 32.0–36.0)
MCV: 88.9 fL (ref 80.0–100.0)
MPV: 9.3 fL (ref 7.5–12.5)
Monocytes Relative: 6.4 %
Neutro Abs: 4744 cells/uL (ref 1500–7800)
Neutrophils Relative %: 53.3 %
Platelets: 328 10*3/uL (ref 140–400)
RBC: 5.3 10*6/uL — ABNORMAL HIGH (ref 3.80–5.10)
RDW: 12.4 % (ref 11.0–15.0)
Total Lymphocyte: 37.5 %
WBC: 8.9 10*3/uL (ref 3.8–10.8)

## 2020-11-13 IMAGING — US US MFM OB DETAIL+14 WK
1 series · 13 of 28 positions shown · non-contrast
Comparison: none

[Series 1: us mfm ob detail+14 wk · 82 acquisitions, 13 frames shown]
[im 4/82]
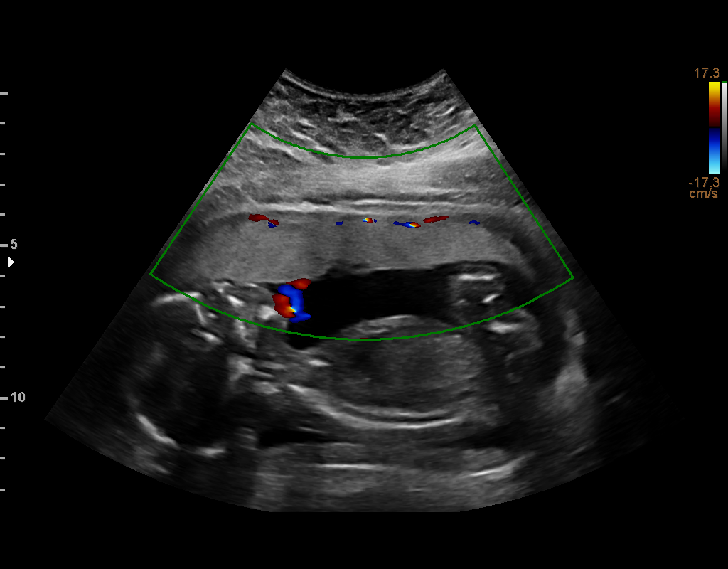
[im 10/82]
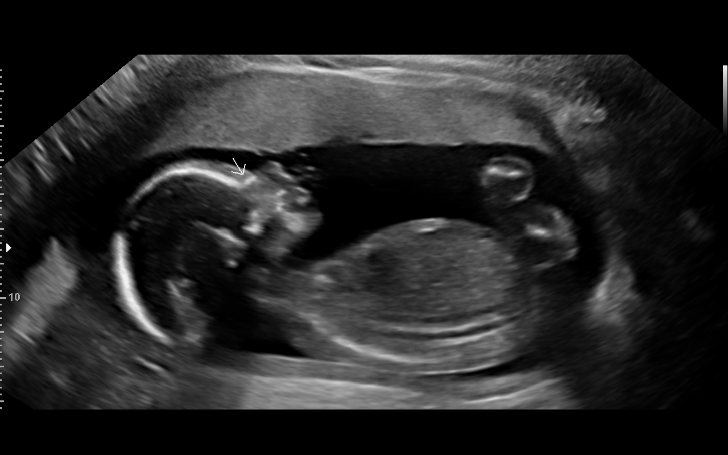
[im 16/82]
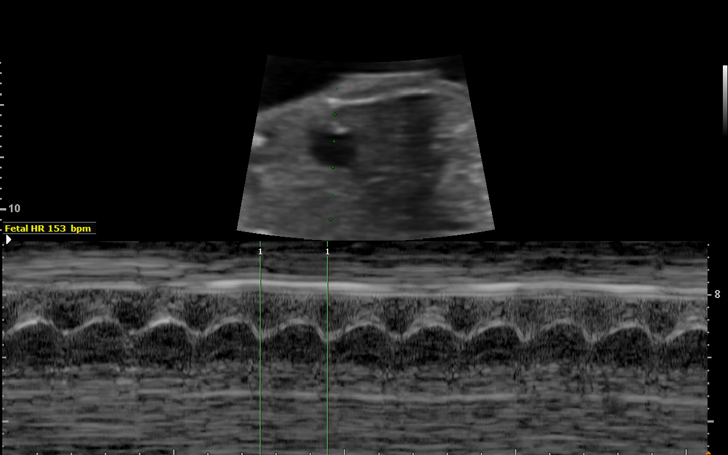
[im 22/82]
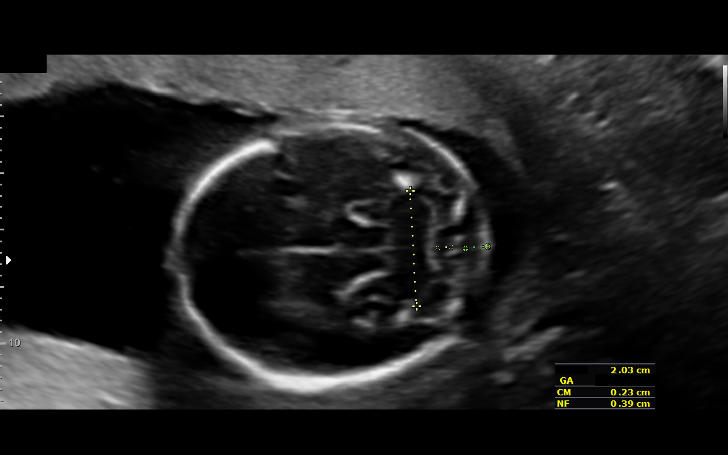
[im 28/82]
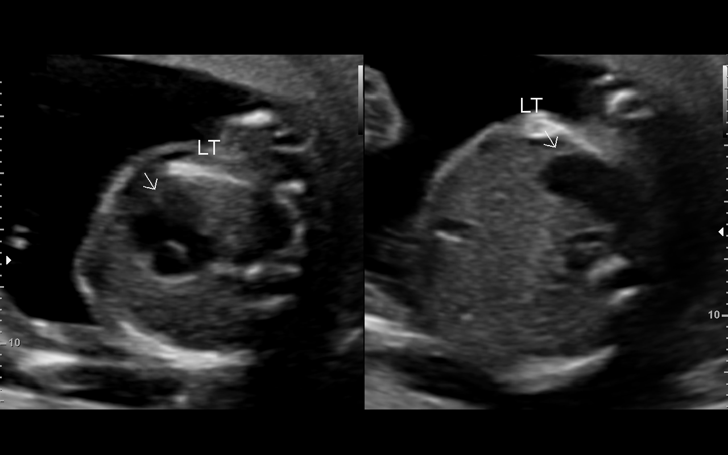
[im 34/82]
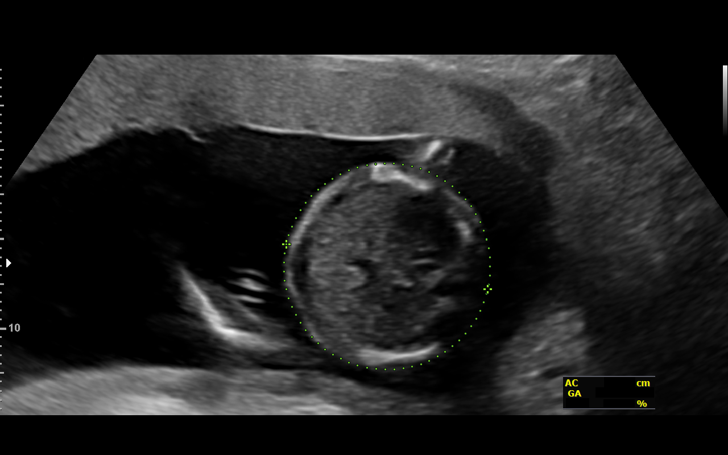
[im 43/82]
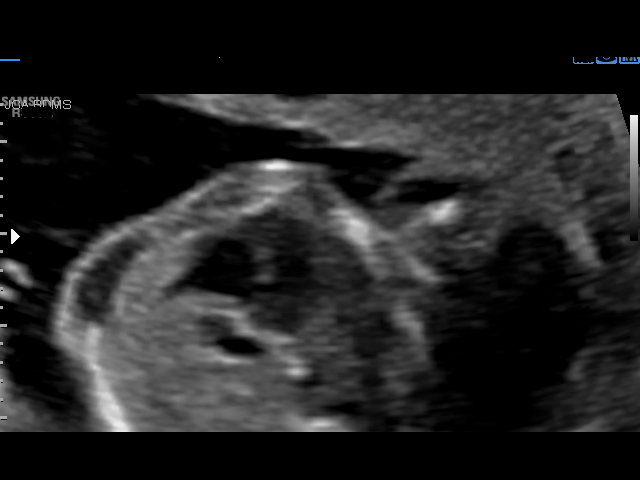
[im 49/82]
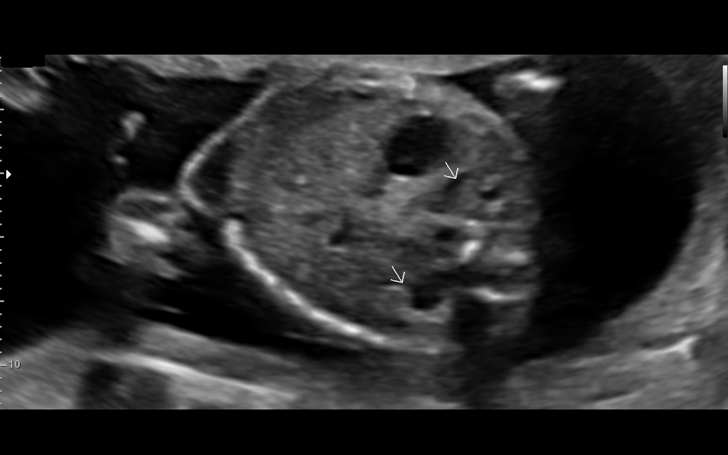
[im 55/82]
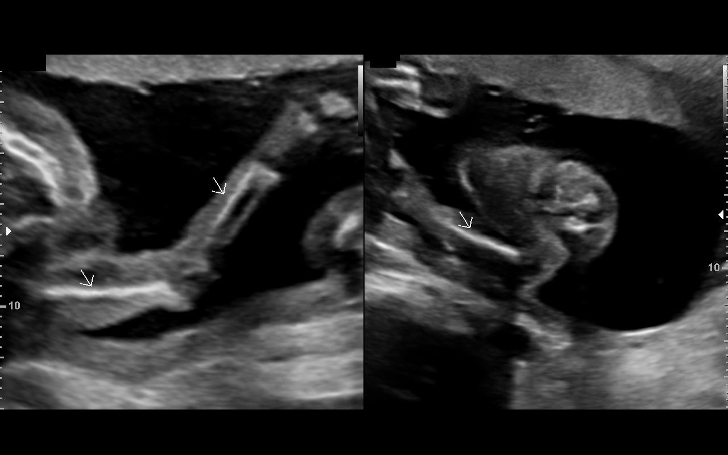
[im 61/82]
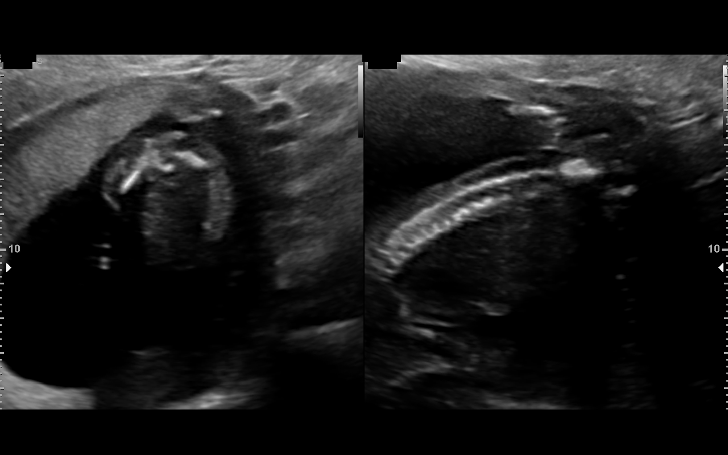
[im 67/82]
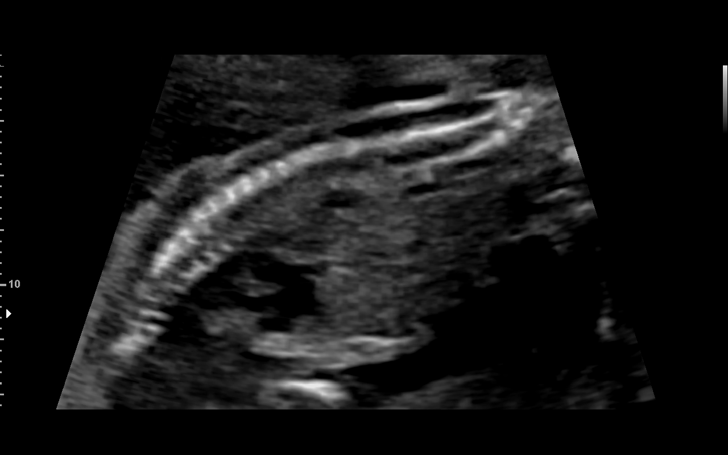
[im 73/82]
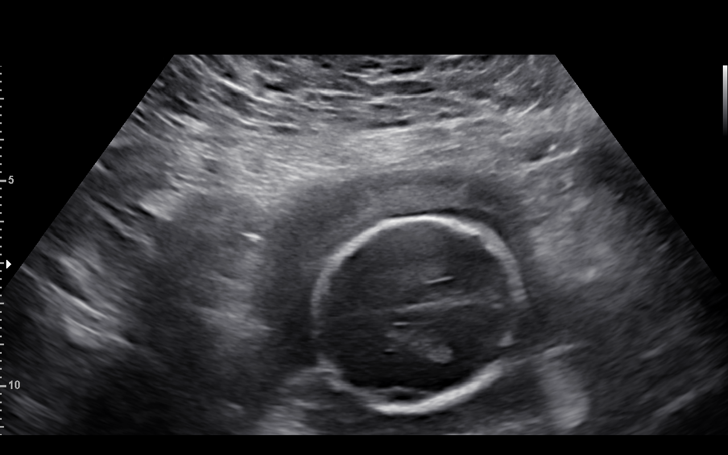
[im 79/82]
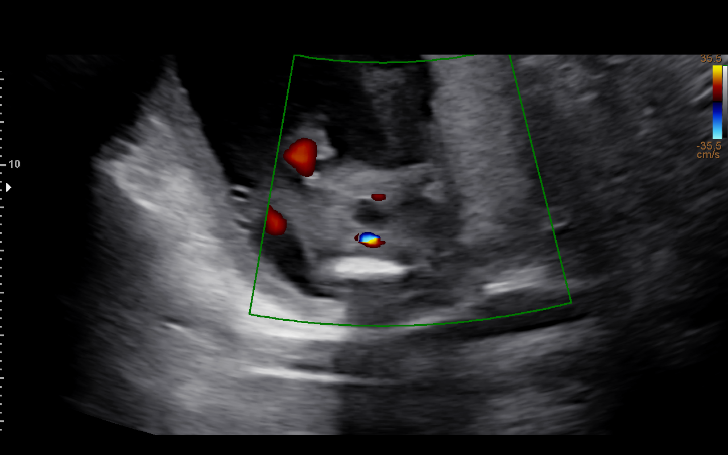

[13 of 28 positions shown; findings below may reference images not displayed]

----------------------------------------------------------------------

 ----------------------------------------------------------------------
Indications

  Obesity complicating pregnancy (pregravid
  BMI 40)
  Poor obstetric history-Recurrent (habitual)
  abortion (4 consecutive ab's)
  Encounter for antenatal screening for
  malformations
  18 weeks gestation of pregnancy
 ----------------------------------------------------------------------
Vital Signs

 BMI:
Fetal Evaluation

 Num Of Fetuses:         1
 Fetal Heart Rate(bpm):  153
 Cardiac Activity:       Observed
 Presentation:           Breech
 Placenta:               Anterior
 P. Cord Insertion:      Visualized

 Amniotic Fluid
 AFI FV:      Within normal limits

                             Largest Pocket(cm)

Biometry

 BPD:      46.2  mm     G. Age:  20w 0d         92  %    CI:        78.78   %    70 - 86
                                                         FL/HC:      18.8   %    16.1 -
 HC:      164.6  mm     G. Age:  19w 1d         66  %    HC/AC:      1.11        1.09 -
 AC:      147.9  mm     G. Age:  20w 0d         86  %    FL/BPD:     67.1   %
 FL:         31  mm     G. Age:  19w 4d         76  %    FL/AC:      21.0   %    20 - 24
 HUM:      28.8  mm     G. Age:  19w 2d         69  %
 CER:        20  mm     G. Age:  19w 0d         61  %
 NFT:       3.9  mm
 LV:        4.5  mm
 CM:        2.9  mm

 Est. FW:     313  gm    0 lb 11 oz      96  %
OB History

 Gravidity:    5         Term:   0        Prem:   0        SAB:   3
 Ectopic:      1        Living:  0
Gestational Age

 LMP:           18w 5d        Date:  09/12/18                 EDD:   06/19/19
 U/S Today:     19w 5d                                        EDD:   06/12/19
 Best:          18w 5d     Det. By:  LMP  (09/12/18)          EDD:   06/19/19
Anatomy

 Cranium:               Appears normal         LVOT:                   Appears normal
 Cavum:                 Appears normal         Aortic Arch:            Appears normal
 Ventricles:            Appears normal         Ductal Arch:            Not well visualized
 Choroid Plexus:        Appears normal         Diaphragm:              Appears normal
 Cerebellum:            Appears normal         Stomach:                Appears normal, left
                                                                       sided
 Posterior Fossa:       Appears normal         Abdomen:                Appears normal
 Nuchal Fold:           Appears normal         Abdominal Wall:         Appears nml (cord
                                                                       insert, abd wall)
 Face:                  Appears normal         Cord Vessels:           Appears normal (3
                        (orbits and profile)                           vessel cord)
 Lips:                  Appears normal         Kidneys:                Appear normal
 Palate:                Appears normal         Bladder:                Appears normal
 Thoracic:              Appears normal         Spine:                  Ltd views no
                                                                       intracranial signs of
                                                                       NTD
 Heart:                 Appears normal         Upper Extremities:      Appears normal
                        (4CH, axis, and
                        situs)
 RVOT:                  Appears normal         Lower Extremities:      Appears normal

 Other:  Male gender. Nasal bone visualized. 5th digit visualized. Technically
         difficult due to maternal habitus and fetal position.
Comments

 This patient was seen for a detailed fetal anatomy scan due
 to maternal obesity. She denies any significant past medical
 history and denies any problems in her current pregnancy.
 She had a cell free DNA test earlier in her pregnancy which
 indicated a low risk for trisomy 21, 18, and 13. A male fetus is
 predicted.
 She was informed that the fetal growth and amniotic fluid
 level were appropriate for her gestational age.
 There were no obvious fetal anomalies noted on today's
 ultrasound exam.  However, today's exam was limited due to
 maternal body habitus in the fetal position.  The fetal cardiac
 views were suboptimal today.
 The patient was informed that anomalies may be missed due
 to technical limitations. If the fetus is in a suboptimal position
 or maternal habitus is increased, visualization of the fetus in
 the maternal uterus may be impaired.
 Due to maternal obesity, a follow-up exam was scheduled in
 4 weeks.  We will try to obtain better views of the fetal
 anatomy during that ultrasound exam.

## 2020-11-16 ENCOUNTER — Other Ambulatory Visit: Payer: Self-pay | Admitting: Internal Medicine

## 2020-12-14 ENCOUNTER — Telehealth: Payer: Medicaid Other | Admitting: Physician Assistant

## 2020-12-14 DIAGNOSIS — L709 Acne, unspecified: Secondary | ICD-10-CM | POA: Diagnosis not present

## 2020-12-14 MED ORDER — BENZOYL PEROXIDE-ERYTHROMYCIN 5-3 % EX GEL: Freq: Two times a day (BID) | CUTANEOUS | 0 refills | Status: DC

## 2020-12-14 MED ORDER — DOXYCYCLINE HYCLATE 100 MG PO TABS: 100.0000 mg | ORAL_TABLET | Freq: Two times a day (BID) | ORAL | 0 refills | Status: AC

## 2020-12-14 NOTE — Progress Notes (Signed)
We are sorry that you are experiencing this issue.  Here is how we plan to help!  Based on what you shared with me it looks like you have cystic acne.  Acne is a disorder of the hair follicles and oil glands (sebaceous glands). The sebaceous glands secrete oils to keep the skin moist.  When the glands get clogged, it can lead to pimples or cysts.  These cysts may become infected and leave scars. Acne is very common and normally occurs at puberty.  Acne is also inherited.  Your personal care plan consists of the following recommendations:  I recommend that you use a daily cleanser  You might try an over the counter cleanser that has benzoyl peroxide.  I recommend that you start with a product that has 2.5% benzoyl peroxide.  Stronger concentrations have not been shown to be more effective.  I have prescribed a topical gel with an antibiotic:  Benzoyl peroxide-erythromycin gel.  This gel should be applied to the affected areas twice a day. Be sure to read the package insert for potential side effects.  I have also prescribed one of the following additional therapies:  Doxycycline an oral antibiotic 100 mg twice a day. Do not take this medication if you are pregnant or breastfeeding.   If excessive dryness or peeling occurs, reduce dose frequency or concentration of the topical scrubs.  If excessive stinging or burning occurs, remove the topical gel with mild soap and water and resume at a lower dose the next day.  Remember oral antibiotics and topical acne treatments may increase your sensitivity to the sun!  HOME CARE: Do not squeeze pimples because that can often lead to infections, worse acne, and scars. Use a moisturizer that contains retinoid or fruit acids that may inhibit the development of new acne lesions. Although there is not a clear link that foods can cause acne, doctors do believe that too many sweets predispose you to skin problems.  GET HELP RIGHT AWAY IF: If your acne gets  worse or is not better within 10 days. If you become depressed. If you become pregnant, discontinue medications and call your OB/GYN.  MAKE SURE YOU: Understand these instructions. Will watch your condition. Will get help right away if you are not doing well or get worse.  Thank you for choosing an e-visit.  Your e-visit answers were reviewed by a board certified advanced clinical practitioner to complete your personal care plan. Depending upon the condition, your plan could have included both over the counter or prescription medications.  Please review your pharmacy choice. Make sure the pharmacy is open so you can pick up prescription now. If there is a problem, you may contact your provider through [employer] and have the prescription routed to another pharmacy.  Your safety is important to [place]. If you have drug allergies check your prescription carefully.   For the next 24 hours you can use MyChart to ask questions about today's visit, request a non-urgent call back, or ask for a work or school excuse. You will get an email in the next two days asking about your experience. I hope that your e-visit has been valuable and will speed your recovery.  Approximately 5 minutes was spent documenting and reviewing patient's chart.

## 2020-12-24 DIAGNOSIS — R079 Chest pain, unspecified: Secondary | ICD-10-CM | POA: Diagnosis not present

## 2020-12-24 DIAGNOSIS — R0789 Other chest pain: Secondary | ICD-10-CM | POA: Diagnosis not present

## 2020-12-24 DIAGNOSIS — R6889 Other general symptoms and signs: Secondary | ICD-10-CM | POA: Diagnosis not present

## 2020-12-24 DIAGNOSIS — Z743 Need for continuous supervision: Secondary | ICD-10-CM | POA: Diagnosis not present

## 2020-12-25 ENCOUNTER — Encounter: Payer: Self-pay | Admitting: Internal Medicine

## 2020-12-26 ENCOUNTER — Encounter (HOSPITAL_COMMUNITY): Payer: Self-pay

## 2020-12-26 ENCOUNTER — Ambulatory Visit: Payer: Self-pay | Admitting: *Deleted

## 2020-12-26 ENCOUNTER — Emergency Department (HOSPITAL_COMMUNITY)
Admission: EM | Admit: 2020-12-26 | Discharge: 2020-12-26 | Disposition: A | Discharge status: Home / Self Care | Payer: Medicaid Other | Attending: Emergency Medicine | Admitting: Emergency Medicine

## 2020-12-26 ENCOUNTER — Other Ambulatory Visit: Payer: Self-pay

## 2020-12-26 ENCOUNTER — Emergency Department (HOSPITAL_COMMUNITY): Payer: Medicaid Other

## 2020-12-26 DIAGNOSIS — Z5321 Procedure and treatment not carried out due to patient leaving prior to being seen by health care provider: Secondary | ICD-10-CM | POA: Insufficient documentation

## 2020-12-26 DIAGNOSIS — Z79899 Other long term (current) drug therapy: Secondary | ICD-10-CM | POA: Insufficient documentation

## 2020-12-26 DIAGNOSIS — R079 Chest pain, unspecified: Secondary | ICD-10-CM

## 2020-12-26 DIAGNOSIS — I10 Essential (primary) hypertension: Secondary | ICD-10-CM | POA: Diagnosis not present

## 2020-12-26 DIAGNOSIS — R110 Nausea: Secondary | ICD-10-CM | POA: Insufficient documentation

## 2020-12-26 DIAGNOSIS — R0789 Other chest pain: Secondary | ICD-10-CM | POA: Diagnosis not present

## 2020-12-26 LAB — COMPREHENSIVE METABOLIC PANEL
ALT: 48 U/L — ABNORMAL HIGH (ref 0–44)
AST: 28 U/L (ref 15–41)
Albumin: 4 g/dL (ref 3.5–5.0)
Alkaline Phosphatase: 69 U/L (ref 38–126)
Anion gap: 8 (ref 5–15)
BUN: 11 mg/dL (ref 6–20)
CO2: 28 mmol/L (ref 22–32)
Calcium: 9.8 mg/dL (ref 8.9–10.3)
Chloride: 100 mmol/L (ref 98–111)
Creatinine, Ser: 0.8 mg/dL (ref 0.44–1.00)
GFR, Estimated: >60 mL/min (ref >60)
Glucose, Bld: 144 mg/dL — ABNORMAL HIGH (ref 70–99)
Potassium: 4.2 mmol/L (ref 3.5–5.1)
Sodium: 136 mmol/L (ref 135–145)
Total Bilirubin: 1 mg/dL (ref 0.3–1.2)
Total Protein: 7 g/dL (ref 6.5–8.1)

## 2020-12-26 LAB — CBC WITH DIFFERENTIAL/PLATELET
Abs Immature Granulocytes: 0.05 10*3/uL (ref 0.00–0.07)
Basophils Absolute: 0.1 10*3/uL (ref 0.0–0.1)
Basophils Relative: 1 %
Eosinophils Absolute: 0.2 10*3/uL (ref 0.0–0.5)
Eosinophils Relative: 2 %
HCT: 45.8 % (ref 36.0–46.0)
Hemoglobin: 15.8 g/dL — ABNORMAL HIGH (ref 12.0–15.0)
Immature Granulocytes: 1 %
Lymphocytes Relative: 35 %
Lymphs Abs: 3.1 10*3/uL (ref 0.7–4.0)
MCH: 30 pg (ref 26.0–34.0)
MCHC: 34.5 g/dL (ref 30.0–36.0)
MCV: 87.1 fL (ref 80.0–100.0)
Monocytes Absolute: 0.5 10*3/uL (ref 0.1–1.0)
Monocytes Relative: 5 %
Neutro Abs: 5 10*3/uL (ref 1.7–7.7)
Neutrophils Relative %: 56 %
Platelets: 314 10*3/uL (ref 150–400)
RBC: 5.26 MIL/uL — ABNORMAL HIGH (ref 3.87–5.11)
RDW: 12.5 % (ref 11.5–15.5)
WBC: 8.9 10*3/uL (ref 4.0–10.5)
nRBC: 0 % (ref 0.0–0.2)

## 2020-12-26 LAB — TROPONIN I (HIGH SENSITIVITY)
Troponin I (High Sensitivity): 4 ng/L (ref <18)
Troponin I (High Sensitivity): 4 ng/L (ref <18)

## 2020-12-26 NOTE — Telephone Encounter (Signed)
Noted, will review ED note 

## 2020-12-26 NOTE — Telephone Encounter (Signed)
Reason for Disposition  Systolic BP  >= 0000000 OR Diastolic >= [id number]  [id number] Systolic BP  >= 0000000 OR Diastolic >= 123XX123 AND [id number] cardiac or neurologic symptoms (e.g., chest pain, difficulty breathing, unsteady gait, blurred vision)    Chest pressure, upper back pain, jaw tingling, BP every elevated  Answer Assessment - Initial Assessment Questions 1. BLOOD PRESSURE: "What is the blood pressure?" "Did you take at least two measurements 5 minutes apart?"     140/124 last night.     Sat. Night I had EMS come to my house.  I thought I was having a heart attack.   It was 135/111 when they came.  They  did an EKG.   139/119 took it 140/124 last night.     I'm taking my BP medicines as prescribed. Sat. Night I felt like I was having a heart attack.   The EKG did not show a heart attack.   I did not want to to to the ED.   I was having a lot of pressure in my chest like an elephant sitting on my chest.    I don't know what was going on with my chest.   Denies headaches.   If I stand up too fast I see stairs and feel a little dizzy.   If I bend down to pick up my son.    I had pre eclampsia   My son is 33 month old.   I've had high BP since then.  My BP medications have been controlling it.    I don't know why it's suddenly high and I had that pressure in my chest Sat. Night about 2:30 AM.  2. ONSET: "When did you take your blood pressure?"     Just now it's 130/100, pulse 98.  I feel fine now. 3. HOW: "How did you obtain the blood pressure?" (e.g., visiting nurse, automatic home BP monitor)     Arm cuff home machine.   I think my cuff is too small. 4. HISTORY: "Do you have a history of high blood pressure?"     Yes 5. MEDICATIONS: "Are you taking any medications for blood pressure?" "Have you missed any doses recently?"     Yes 6. OTHER SYMPTOMS: "Do you have any symptoms?" (e.g., headache, chest pain, blurred vision, difficulty breathing, weakness)     No not now.    Denies headaches or vision changes.   See above  regarding dizziness with standing up 7. PREGNANCY: "Is there any chance you are pregnant?" "When was your last menstrual period?"     No  Protocols used: Blood Pressure - High-A-AH

## 2020-12-26 NOTE — ED Provider Notes (Signed)
Emergency Medicine Provider Triage Evaluation Note  Alexis Ellis , a 30 y.o. female  was evaluated in triage.  Pt complains of chest tightness  Review of Systems  Positive: Radiates to right shoulder Negative: No fever  Physical Exam  BP (!) 141/91 (BP Location: Right Arm)   Pulse (!) 104   Temp 97.9 F (36.6 C) (Oral)   Resp 19   SpO2 100%  Gen:   Awake, no distress   Resp:  Normal effort  MSK:   Moves extremities without difficulty  Other:    Medical Decision Making  Medically screening exam initiated at 2:24 PM.  Appropriate orders placed.  Alexis Ellis was informed that the remainder of the evaluation will be completed by another provider, this initial triage assessment does not replace that evaluation, and the importance of remaining in the ED until their evaluation is complete.     Fransico Meadow, [place] 12/26/20 1425    Blanchie Dessert, MD 12/26/20 1438

## 2020-12-26 NOTE — ED Notes (Addendum)
Pt had to leave to get daughter

## 2020-12-26 NOTE — Telephone Encounter (Signed)
Pt called in c/o her BP being elevated.  Sat night/early Sunday morning 2:30 AM she felt like she was having a heart attack.   Very heavy pressure in her chest, pain in her upper back "like she had a big bruise there", jaw tingling.   EMS was called.   Her BP was elevated she can't recall what it was.   They did an EKG and said it did not show she was having a heart attack but they could not tell her she wasn't and that she needed to go to the ED.   It was her choice.   She decided not to go.    Her BP has been elevated since.   She is on BP medications which she is taking as prescribed.  Last night her BP was 140/124.   Day before it was 135/110.    She took it while on the phone with me it was 130/100.   She is not having any symptoms now.   Denies being dizzy  except when she stands up suddenly she sees stars and is a little dizzy.  Also if she bends over to pick up her son.   She had pre eclampsia with her son.   He is 12 months old now.   Her BP has been elevated since then and she's been on medications that have controlled it.    "Not sure what is going on with me now".  I have referred her to the ED per the protocol and sent my notes to Webb Silversmith, NP at [facility].     Pt is going to [facility] in [place].   She was agreeable to going.

## 2020-12-26 NOTE — ED Triage Notes (Signed)
Pt reports htn the past few days (140s/110s) along with chest discomfort that radiates to her right shoulder and neck. Pt takes lisinopril

## 2020-12-26 NOTE — Telephone Encounter (Signed)
Will discuss at upcoming appointment

## 2020-12-27 ENCOUNTER — Other Ambulatory Visit: Payer: Self-pay

## 2020-12-27 ENCOUNTER — Encounter: Payer: Self-pay | Admitting: Internal Medicine

## 2020-12-27 ENCOUNTER — Ambulatory Visit: Payer: Medicaid Other | Admitting: Internal Medicine

## 2020-12-27 VITALS — BP 117/71 | HR 112 | Temp 97.8°F | Resp 17 | Ht 65.0 in | Wt 235.4 lb

## 2020-12-27 DIAGNOSIS — Z6839 Body mass index (BMI) 39.0-39.9, adult: Secondary | ICD-10-CM

## 2020-12-27 DIAGNOSIS — F419 Anxiety disorder, unspecified: Secondary | ICD-10-CM

## 2020-12-27 DIAGNOSIS — F902 Attention-deficit hyperactivity disorder, combined type: Secondary | ICD-10-CM | POA: Diagnosis not present

## 2020-12-27 DIAGNOSIS — E6609 Other obesity due to excess calories: Secondary | ICD-10-CM | POA: Insufficient documentation

## 2020-12-27 DIAGNOSIS — Z6838 Body mass index (BMI) 38.0-38.9, adult: Secondary | ICD-10-CM | POA: Insufficient documentation

## 2020-12-27 DIAGNOSIS — I10 Essential (primary) hypertension: Secondary | ICD-10-CM

## 2020-12-27 NOTE — Patient Instructions (Signed)

## 2020-12-27 NOTE — Assessment & Plan Note (Signed)
Controlled on current dose of lisinopril HCT Reinforced DASH diet and exercise weight loss Rx for blood pressure cuff given today, she will monitor at home and let me know if remains elevated

## 2020-12-27 NOTE — Assessment & Plan Note (Signed)
Discussed that stimulant Adderall may be contributing to elevated blood pressures She would like to continue at this time

## 2020-12-27 NOTE — Progress Notes (Signed)
Subjective:    Patient ID: Alexis Ellis, female    DOB: 1990/10/31, 30 y.o.   MRN: [id number]  HPI  Patient presents the clinic today for follow-up of hypertension.  She reports her blood pressure at home has been 150/110.  She went to the ER for evaluation but left without being seen.  Her labs at that visit were unremarkable.  She is taking her Lisinopril HCT as prescribed. Her BP today is 117/71. She is feeling anxious, overstimulated at times. She takes Clonazepam as needed. She was on Prozac in the past but stopped when she got pregnant, and does not feel like she needs this now. She is on Adderral, but has been on this for years.  Review of Systems     Past Medical History:  Diagnosis Date   ADD (attention deficit disorder)    Chicken pox    Depression    Diet controlled gestational diabetes mellitus (GDM) in third trimester 03/23/2019   Ectopic pregnancy    Elevated blood pressure    Elevated cholesterol    Frequent headaches    Gestational hypertension 04/19/2019   History of kidney stones    Pregnancy induced hypertension     Current Outpatient Medications  Medication Sig Dispense Refill   amoxicillin-clavulanate (AUGMENTIN) 875-125 MG tablet Take 1 tablet by mouth 2 (two) times daily. 20 tablet 0   amphetamine-dextroamphetamine (ADDERALL) 20 MG tablet Take 1 tablet by mouth 4 (four) times daily.     benzoyl peroxide-erythromycin (BENZAMYCIN) gel Apply topically 2 (two) times daily. 23.3 g 0   cetirizine (ZYRTEC) 10 MG tablet Take 1 tablet (10 mg total) by mouth daily. 30 tablet 11   clonazePAM (KLONOPIN) 1 MG tablet Take 1 mg by mouth 2 (two) times daily as needed.     fluconazole (DIFLUCAN) 150 MG tablet Take 1 tablet (150 mg total) by mouth every three (3) days as needed. 2 tablet 0   fluticasone (FLONASE) 50 MCG/ACT nasal spray Place 2 sprays into both nostrils daily. 16 g 6   IBUPROFEN PO Take by mouth as needed.     lisinopril-hydrochlorothiazide (ZESTORETIC)  10-12.5 MG tablet TAKE 1 TABLET BY MOUTH EVERY DAY 90 tablet 0   No current facility-administered medications for this visit.    No Known Allergies  Family History  Problem Relation Age of Onset   Hypertension Mother        Living   Fibromyalgia Mother    Diabetes Father 41       Deceased   Heart attack Father    Sudden death Father    Bipolar disorder Maternal Grandmother    Diabetes Maternal Grandfather    Alcohol abuse Paternal Grandfather    Liver disease Paternal Grandfather    Hyperlipidemia Paternal Grandmother    Kidney disease Paternal Grandmother    Liver disease Paternal Grandmother    Heart disease Paternal Grandmother    Healthy Sister    Healthy Son     Social History   Socioeconomic History   Marital status: Single    Spouse name: Not on file   Number of children: Not on file   Years of education: Not on file   Highest education level: Not on file  Occupational History   Not on file  Tobacco Use   Smoking status: Former    Packs/day: 0.00    Types: Cigarettes    Quit date: 2020    Years since quitting: 2.6   Smokeless tobacco: Never  Vaping  Use   Vaping Use: Never used  Substance and Sexual Activity   Alcohol use: Not Currently    Alcohol/week: 0.0 standard drinks   Drug use: No   Sexual activity: Yes    Partners: Male    Birth control/protection: None  Other Topics Concern   Not on file  Social History Narrative   Not on file   Social Determinants of Health   Financial Resource Strain: Not on file  Food Insecurity: Not on file  Transportation Needs: Not on file  Physical Activity: Not on file  Stress: Not on file  Social Connections: Not on file  Intimate Partner Violence: Not on file     Constitutional: Denies fever, malaise, fatigue, headache or abrupt weight changes.  HEENT: Denies eye pain, eye redness, ear pain, ringing in the ears, wax buildup, runny nose, nasal congestion, bloody nose, or sore throat. Respiratory: Denies  difficulty breathing, shortness of breath, cough or sputum production.   Cardiovascular: Denies chest pain, chest tightness, palpitations or swelling in the hands or feet.  Neurological: Pt reports inattention. Denies dizziness, difficulty with memory, difficulty with speech or problems with balance and coordination.  Psych: Pt has a history of anxiety. Denies depression, SI/HI.  No other specific complaints in a complete review of systems (except as listed in HPI above).  Objective:   Physical Exam  BP 117/71 (BP Location: Right Arm, Patient Position: Sitting, Cuff Size: Large)   Pulse (!) 112   Temp 97.8 F (36.6 C) (Temporal)   Resp 17   Ht '5\' 5"'$  (1.651 m)   Wt 235 lb 6.4 oz (106.8 kg)   SpO2 100%   BMI 39.17 kg/m   Wt Readings from Last 3 Encounters:  10/12/20 235 lb 1.6 oz (106.6 kg)  09/07/20 234 lb (106.1 kg)  07/18/20 239 lb 3.2 oz (108.5 kg)    General: Appears her stated age, obese, in NAD. Skin: Warm, dry and intact.  HEENT: Head: normal shape and size; Eyes: sclera white and EOMs intact;  Cardiovascular: Normal rate and rhythm. S1,S2 noted.  No murmur, rubs or gallops noted. No  Pulmonary/Chest: Normal effort and positive vesicular breath sounds. No respiratory distress. No wheezes, rales or ronchi noted.  Neurological: Alert and oriented.  MSK: No difficulty with gait. Psychiatric: Mood and affect normal. Anxious appearing. Judgment and thought content normal.     BMET    Component Value Date/Time   NA 136 12/26/2020 1426   K 4.2 12/26/2020 1426   CL 100 12/26/2020 1426   CO2 28 12/26/2020 1426   GLUCOSE 144 (H) 12/26/2020 1426   BUN 11 12/26/2020 1426   CREATININE 0.80 12/26/2020 1426   CREATININE 1.00 06/05/2015 2228   CALCIUM 9.8 12/26/2020 1426   GFRNONAA >60 12/26/2020 1426   GFRAA >60 05/01/2019 1723    Lipid Panel     Component Value Date/Time   CHOL 234 (H) 03/27/2020 1227   TRIG 134.0 03/27/2020 1227   HDL 43.20 03/27/2020 1227    CHOLHDL 5 03/27/2020 1227   VLDL 26.8 03/27/2020 1227   LDLCALC 164 (H) 03/27/2020 1227    CBC    Component Value Date/Time   WBC 8.9 12/26/2020 1426   RBC 5.26 (H) 12/26/2020 1426   HGB 15.8 (H) 12/26/2020 1426   HGB 12.7 03/22/2019 0930   HCT 45.8 12/26/2020 1426   HCT 37.7 03/22/2019 0930   PLT 314 12/26/2020 1426   PLT 280 03/22/2019 0930   MCV 87.1 12/26/2020 1426  MCV 84 03/22/2019 0930   MCH 30.0 12/26/2020 1426   MCHC 34.5 12/26/2020 1426   RDW 12.5 12/26/2020 1426   RDW 12.8 03/22/2019 0930   LYMPHSABS 3.1 12/26/2020 1426   LYMPHSABS 2.3 11/30/2018 0955   MONOABS 0.5 12/26/2020 1426   EOSABS 0.2 12/26/2020 1426   EOSABS 0.1 11/30/2018 0955   BASOSABS 0.1 12/26/2020 1426   BASOSABS 0.0 11/30/2018 0955    Hgb A1C Lab Results  Component Value Date   HGBA1C 5.3 03/27/2020           Assessment & Plan:   Webb Silversmith, NP This visit occurred during the SARS-CoV-2 public health emergency.  Safety protocols were in place, including screening questions prior to the visit, additional usage of staff PPE, and extensive cleaning of exam room while observing appropriate contact time as indicated for disinfecting solutions.

## 2020-12-27 NOTE — Assessment & Plan Note (Signed)
Encourage diet and exercise for weight loss 

## 2020-12-27 NOTE — Assessment & Plan Note (Signed)
Discussed how anxiety can contribute to elevated blood pressures She does not want to take daily antianxiety medicine She will continue clonazepam as needed

## 2021-01-11 DIAGNOSIS — S060X0A Concussion without loss of consciousness, initial encounter: Secondary | ICD-10-CM | POA: Diagnosis not present

## 2021-01-26 NOTE — Progress Notes (Deleted)
Subjective:    Chief Complaint: Alexis Ellis,  is a 30 y.o. female who presents for evaluation of a head injury that occurred on 01/09/21 when her child accidentally hit her in the head w/ a toy.  She was seen at [facility] on 01/11/21 w/ c/o HA, phonophobia, balance disturbance and blurry vision.  Today, pt reports   Injury date : 01/09/21 Visit #: 1   History of Present Illness:    Concussion Self-Reported Symptom Score Symptoms rated on a scale 1-6, in last 24 hours   Headache: ***    Nausea: ***  Dizziness: ***  Vomiting: ***  Balance Difficulty: ***   Trouble Falling Asleep: ***   Fatigue: ***  Sleep Less Than Usual: ***  Daytime Drowsiness: ***  Sleep More Than Usual: ***  Photophobia: ***  Phonophobia: ***  Irritability: ***  Sadness: ***  Numbness or Tingling: ***  Nervousness: ***  Feeling More Emotional: ***  Feeling Mentally Foggy: ***  Feeling Slowed Down: ***  Memory Problems: ***  Difficulty Concentrating: ***  Visual Problems: ***   Total # of Symptoms: Total Symptom Score: ***    Neck Pain: Yes/No  Tinnitus: Yes/No  Review of Systems:  ***    Review of History: ***  Objective:    Physical Examination There were no vitals filed for this visit. MSK:  *** Neuro: *** Psych: ***     Imaging:  ***  Assessment and Plan   30 y.o. female with ***    ***    Action/Discussion: Reviewed diagnosis, management options, expected outcomes, and the reasons for scheduled and emergent follow-up. Questions were adequately answered. Patient expressed verbal understanding and agreement with the following plan.     Patient Education: Reviewed with patient the risks (i.e, a repeat concussion, post-concussion syndrome, second-impact syndrome) of returning to play prior to complete resolution, and thoroughly reviewed the signs and symptoms of concussion.Reviewed need for complete resolution of all symptoms, with rest AND exertion, prior to  return to play. Reviewed red flags for urgent medical evaluation: worsening symptoms, nausea/vomiting, intractable headache, musculoskeletal changes, focal neurological deficits. [facility] Concussion Care Plan, which clearly outlines the plans stated above, was given to patient.   Level of service: ***     After Visit Summary printed out and provided to patient as appropriate.  The above documentation has been reviewed and is accurate and complete Wendy Poet

## 2021-01-29 ENCOUNTER — Ambulatory Visit: Payer: Medicaid Other | Admitting: Family Medicine

## 2021-01-30 ENCOUNTER — Other Ambulatory Visit: Payer: Self-pay

## 2021-01-30 ENCOUNTER — Ambulatory Visit: Payer: Medicaid Other | Admitting: Family Medicine

## 2021-01-30 ENCOUNTER — Encounter: Payer: Self-pay | Admitting: Family Medicine

## 2021-01-30 VITALS — BP 120/80 | HR 105 | Ht 65.0 in | Wt 235.6 lb

## 2021-01-30 DIAGNOSIS — S060X0A Concussion without loss of consciousness, initial encounter: Secondary | ICD-10-CM

## 2021-01-30 DIAGNOSIS — F419 Anxiety disorder, unspecified: Secondary | ICD-10-CM | POA: Diagnosis not present

## 2021-01-30 DIAGNOSIS — R42 Dizziness and giddiness: Secondary | ICD-10-CM | POA: Diagnosis not present

## 2021-01-30 DIAGNOSIS — F902 Attention-deficit hyperactivity disorder, combined type: Secondary | ICD-10-CM

## 2021-01-30 MED ORDER — FLUOXETINE HCL 20 MG PO CAPS: 20.0000 mg | ORAL_CAPSULE | Freq: Every day | ORAL | 1 refills | Status: DC

## 2021-01-30 NOTE — Progress Notes (Signed)
Subjective:    Chief Complaint: Alexis Ellis, LAT, ATC, am serving as scribe for Dr. Lynne Leader.  Alexis Ellis,  is a 30 y.o. female who presents for evaluation of a head injury she sustained on 01/09/21 when her child accidentally hit her in the L temple w/ a toy.  She was seen at [facility] on 01/11/21 w/ c/o HA, phonophobia, blurry vision and balance issues.  Today, pt reports con't dizziness, HA, radiating pain to the back of her head, nausea and blurry vision. She has tried Tylenol and IBU w/ no relief.  She has also been taking and using BC powder.  She has no prior hx of concussion.  Injury date : 01/09/21 Visit #: 1   History of Present Illness:    Concussion Self-Reported Symptom Score Symptoms rated on a scale 1-6, in last 24 hours   Headache: 4    Nausea: 4  Dizziness: 2  Vomiting: 2  Balance Difficulty: 3   Trouble Falling Asleep: 0   Fatigue: 1  Sleep Less Than Usual: 0  Daytime Drowsiness: 1  Sleep More Than Usual: 0  Photophobia: 0  Phonophobia: 0  Irritability: 5  Sadness: 0  Numbness or Tingling: 2  Nervousness: 6  Feeling More Emotional: 1  Feeling Mentally Foggy: 2  Feeling Slowed Down: 2  Memory Problems: 0  Difficulty Concentrating: 2  Visual Problems: 2   Total # of Symptoms: 16/22 Total Symptom Score: 39/132    Neck Pain: No  Tinnitus: Yes  Review of Systems: No fevers or chills    Review of History: History of anxiety and ADHD.  Previously was on Prozac but stopped it while pregnant.  Objective:    Physical Examination Vitals:   01/30/21 1111  BP: 120/80  Pulse: (!) 105  SpO2: 98%   MSK: C-spine nontender midline normal cervical motion Neuro: Alert and oriented normal coordination and gait. Psych: Normal speech thought process and affect.    Assessment and Plan   30 y.o. female with concussion.  Dominant symptoms are mood related. Plan to restart Prozac.  She previously tolerated this pretty well.  Plan also refer  to for vestibular physical therapy. Return in 2 weeks.  Patient had a fair amount of symptoms it appears prior to her concussion so the goal is not to get her symptom-free but to get her back to her baseline.  She agrees with this plan.    Recheck 2 weeks    Action/Discussion: Reviewed diagnosis, management options, expected outcomes, and the reasons for scheduled and emergent follow-up. Questions were adequately answered. Patient expressed verbal understanding and agreement with the following plan.     Patient Education: Reviewed with patient the risks (i.e, a repeat concussion, post-concussion syndrome, second-impact syndrome) of returning to play prior to complete resolution, and thoroughly reviewed the signs and symptoms of concussion.Reviewed need for complete resolution of all symptoms, with rest AND exertion, prior to return to play. Reviewed red flags for urgent medical evaluation: worsening symptoms, nausea/vomiting, intractable headache, musculoskeletal changes, focal neurological deficits. [facility] Conc[facility] Care Plan, which clearly outlines the plans stated above, was given to patient.   Level of service: Total encounter time 30 minutes including face-to-face time with the patient and, reviewing past medical record, and charting on the date of service.        After Visit Summary printed out and provided to patient as appropriate.  The above documentation has been reviewed and is accurate and complete Lynne Leader

## 2021-01-30 NOTE — Patient Instructions (Addendum)
Thank you for coming in today.   I've referred you to Physical Therapy.  Let us know if you don't hear from them in one week.   Restart prozac.   Recheck with me in 2 weeks.   If things are going wrong let me know.

## 2021-02-10 IMAGING — US US MFM OB FOLLOW-UP
1 series · 14 of 28 positions shown · non-contrast
Comparison: none

[Series 1: us mfm ob follow-up · 31 acquisitions, 14 frames shown]
[im 2/31]
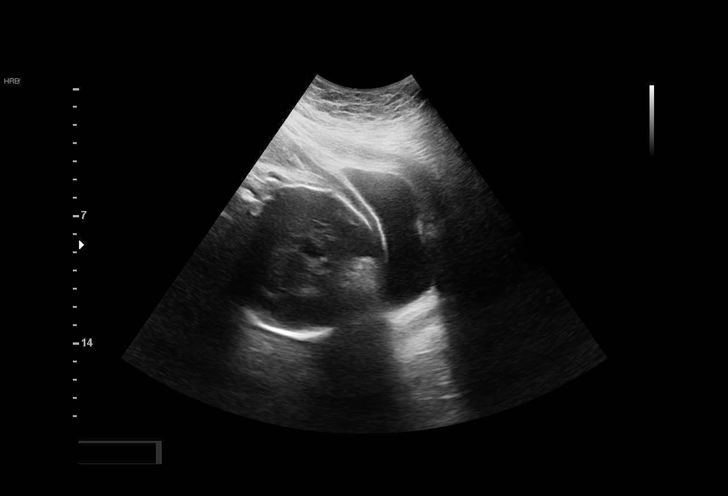
[im 4/31]
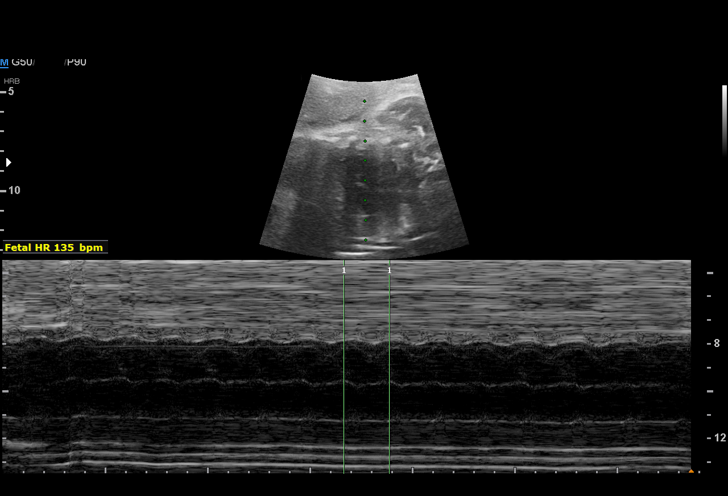
[im 6/31]
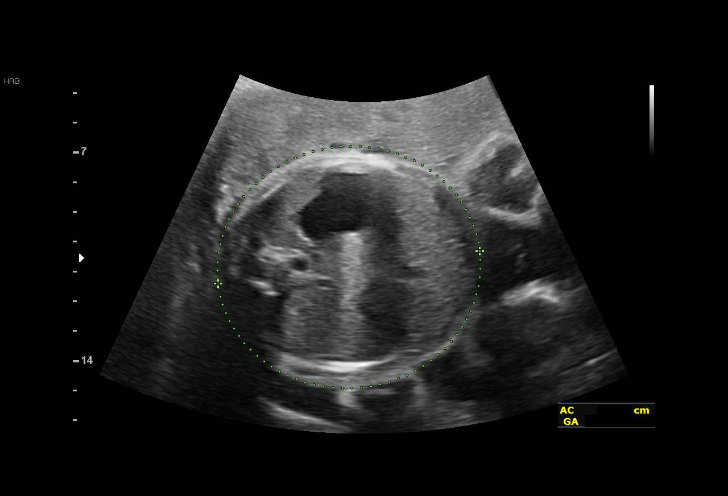
[im 8/31]
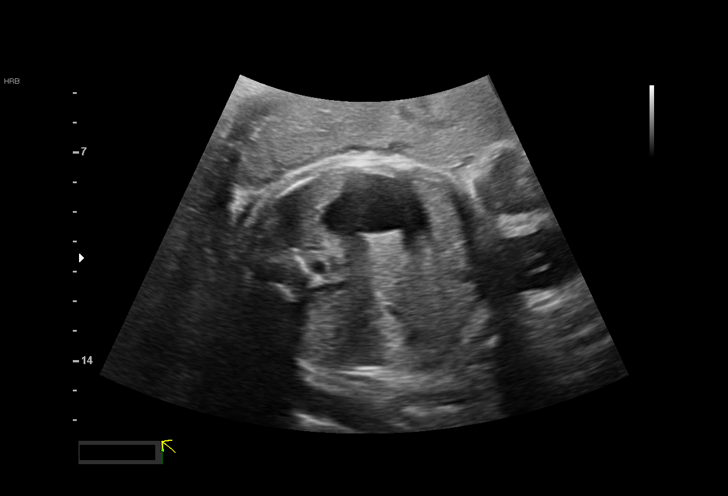
[im 11/31]
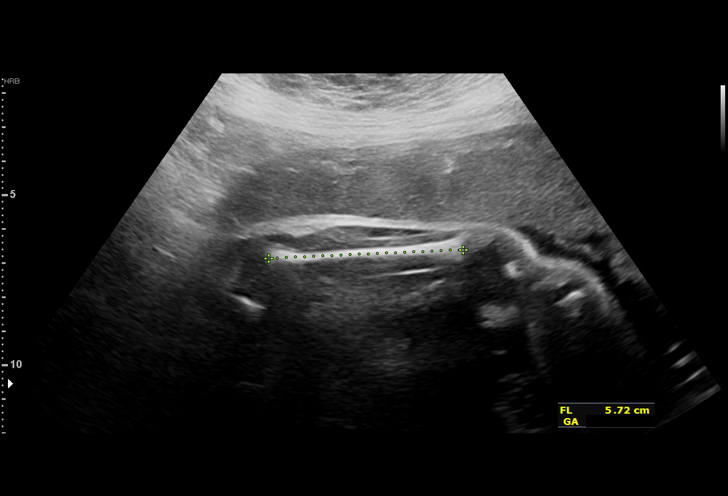
[im 13/31]
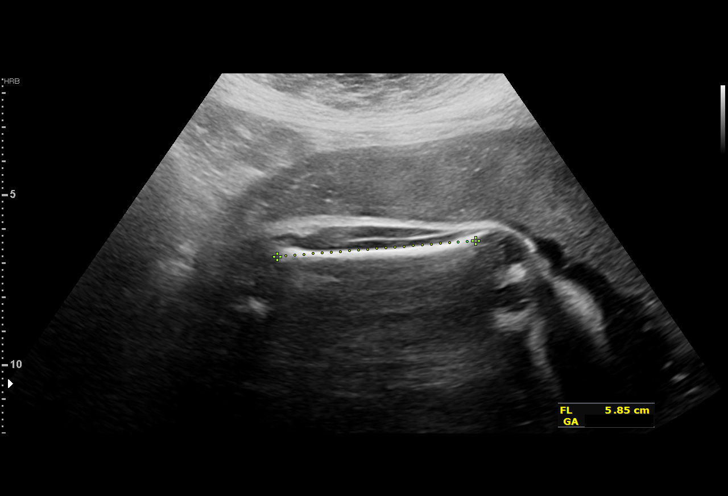
[im 15/31]
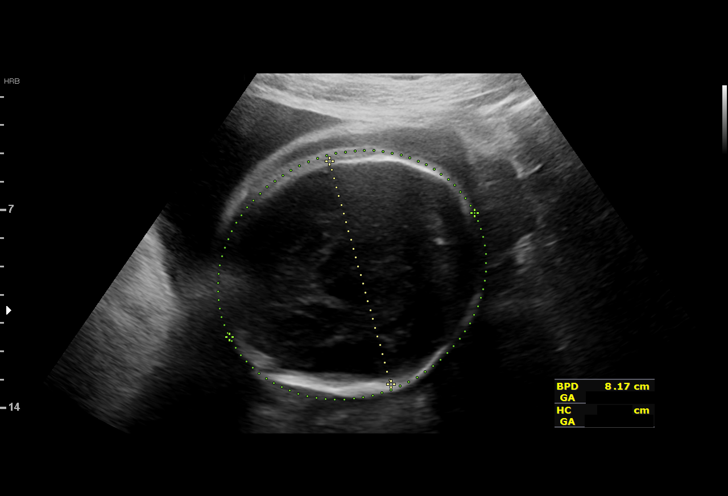
[im 17/31]
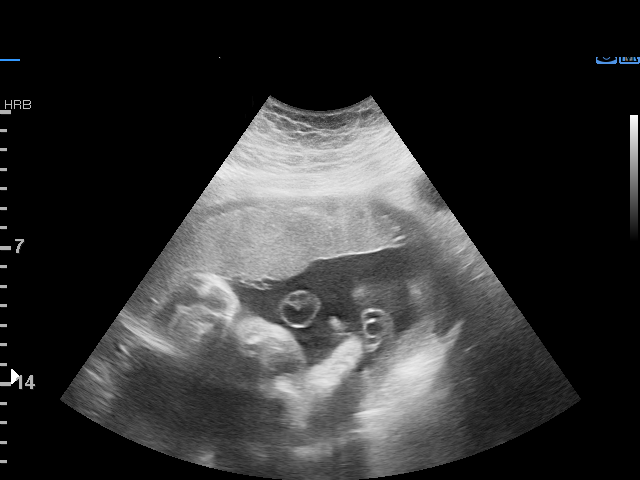
[im 19/31]
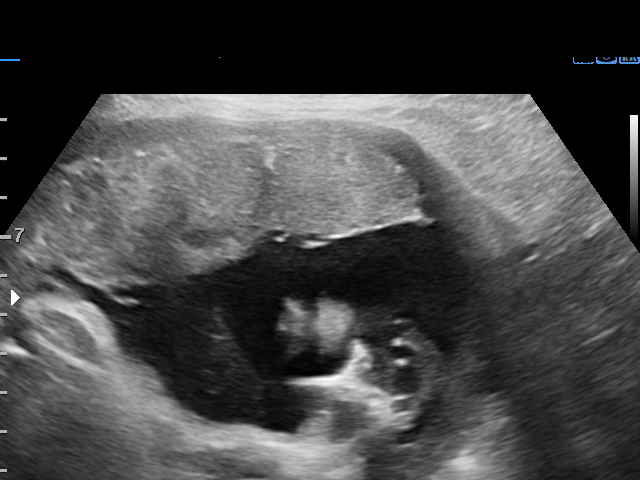
[im 22/31]
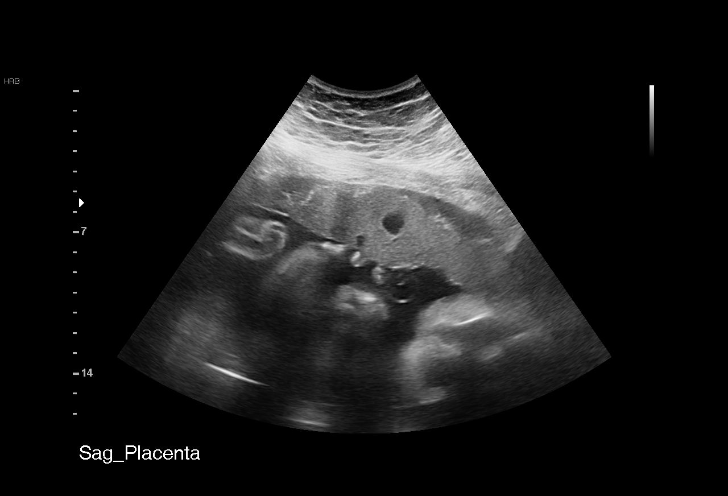
[im 24/31]
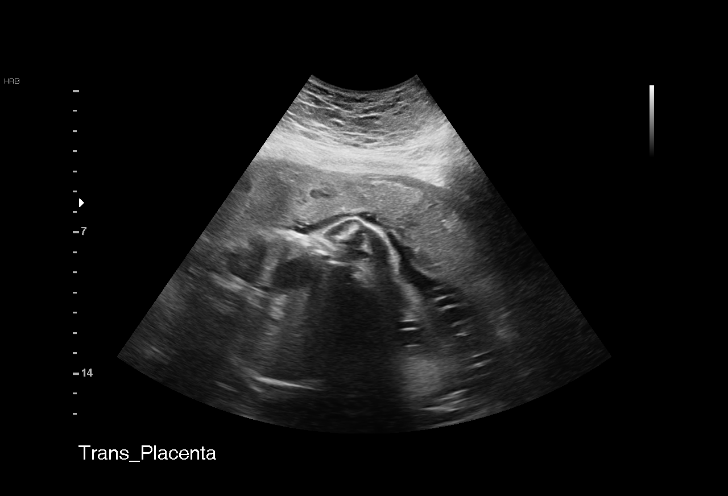
[im 26/31]
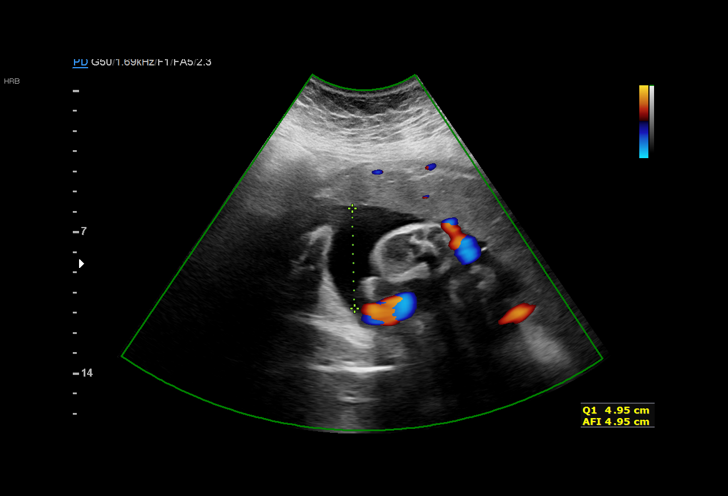
[im 28/31]
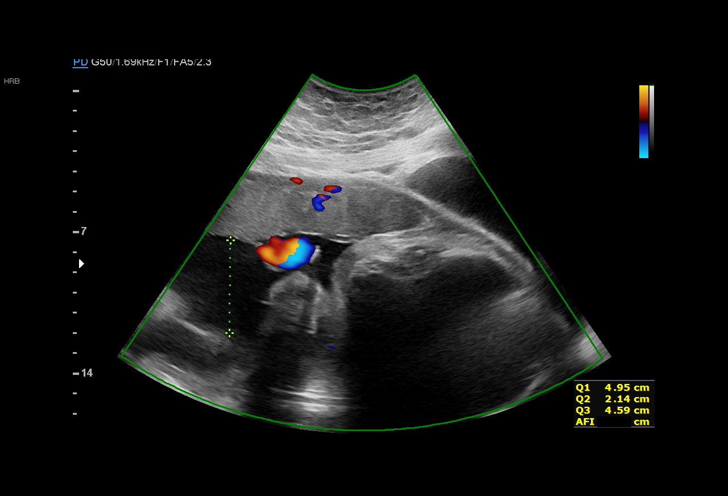
[im 31/31]
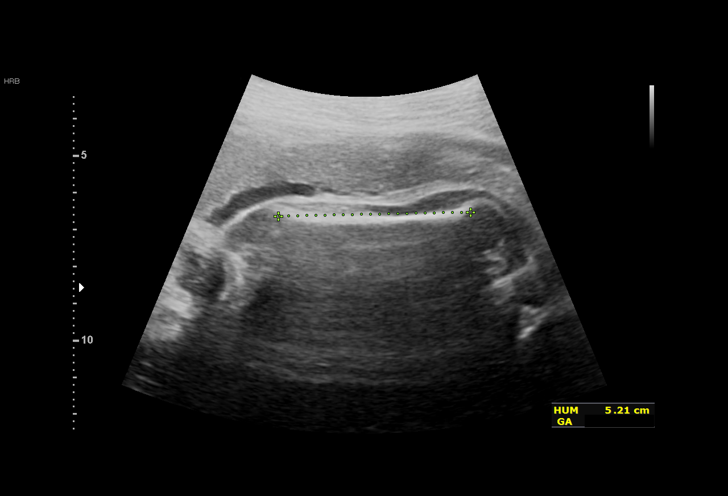

[14 of 28 positions shown; findings below may reference images not displayed]

----------------------------------------------------------------------

 ----------------------------------------------------------------------
Indications

  Obesity complicating pregnancy (pregravid
  BMI 40)
  Hypertension - Gestational (IV Labetalol)
  Poor obstetric history-Recurrent (habitual)
  abortion (4 consecutive ab's)
  Encounter for other antenatal screening
  follow-up
  Gestational diabetes in pregnancy, diet
  controlled
  31 weeks gestation of pregnancy
 ----------------------------------------------------------------------
Vital Signs

 BMI:
Fetal Evaluation

 Num Of Fetuses:         1
 Fetal Heart Rate(bpm):  135
 Cardiac Activity:       Observed
 Presentation:           Cephalic
 Placenta:               Anterior
 P. Cord Insertion:      Previously Visualized

 Amniotic Fluid
 AFI FV:      Within normal limits

 AFI Sum(cm)     %Tile       Largest Pocket(cm)
 11.68           28

 RUQ(cm)       RLQ(cm)       LUQ(cm)        LLQ(cm)
 4.95          0
Biometry

 BPD:      80.7  mm     G. Age:  32w 3d         70  %    CI:        80.19   %    70 - 86
                                                         FL/HC:      20.5   %    19.3 -
 HC:      284.7  mm     G. Age:  31w 2d         12  %    HC/AC:      1.06        0.96 -
 AC:      267.8  mm     G. Age:  30w 6d         31  %    FL/BPD:     72.2   %    71 - 87
 FL:       58.3  mm     G. Age:  30w 3d         14  %    FL/AC:      21.8   %    20 - 24
 HUM:      52.1  mm     G. Age:  30w 3d         30  %

 Est. FW:    4663  gm    3 lb 11 oz      23  %
OB History

 Gravidity:    5         Term:   0        Prem:   0        SAB:   3
 Ectopic:      1        Living:  0
Gestational Age

 LMP:           31w 3d        Date:  09/12/18                 EDD:   06/19/19
 U/S Today:     31w 2d                                        EDD:   06/20/19
 Best:          31w 3d     Det. By:  LMP  (09/12/18)          EDD:   06/19/19
Anatomy

 Cranium:               Appears normal         LVOT:                   Previously seen
 Cavum:                 Previously seen        Aortic Arch:            Previously seen
 Ventricles:            Previously seen        Ductal Arch:            limited views prev
 Choroid Plexus:        Previously seen        Diaphragm:              Appears normal
 Cerebellum:            Previously seen        Stomach:                Appears normal, left
                                                                       sided
 Posterior Fossa:       Previously seen        Abdomen:                Appears normal
 Nuchal Fold:           Previously seen        Abdominal Wall:         Previously seen
 Face:                  Orbits and profile     Cord Vessels:           Previously seen
                        previously seen
 Lips:                  Previously seen        Kidneys:                Appear normal
 Palate:                Previously seen        Bladder:                Appears normal
 Thoracic:              Appears normal         Spine:                  Previously seen
 Heart:                 Previously seen        Upper Extremities:      Previously seen
 RVOT:                  Previously seen        Lower Extremities:      Previously seen

 Other:  Male gender. Nasal bone prev visualized. 5th digit prev visualized.
         Technically difficult due to maternal habitus and fetal position.
Cervix Uterus Adnexa

 Cervix
 Not visualized (advanced GA >70wks)
Comments

 This patient is currently hospitalized due to newly diagnosed
 hypertension and possible severe preeclampsia.  She is
 receiving a complete course of antenatal corticosteroids is
 currently on magnesium sulfate for maternal seizure
 prophylaxis.
 The fetal growth and amniotic fluid level appears appropriate
 for her gestational age.

## 2021-02-12 DIAGNOSIS — M25531 Pain in right wrist: Secondary | ICD-10-CM | POA: Diagnosis not present

## 2021-02-13 ENCOUNTER — Ambulatory Visit: Payer: Medicaid Other | Admitting: Family Medicine

## 2021-02-13 ENCOUNTER — Other Ambulatory Visit: Payer: Self-pay

## 2021-02-13 VITALS — BP 120/80 | HR 105 | Ht 65.0 in | Wt 235.0 lb

## 2021-02-13 DIAGNOSIS — F419 Anxiety disorder, unspecified: Secondary | ICD-10-CM

## 2021-02-13 DIAGNOSIS — S060X0D Concussion without loss of consciousness, subsequent encounter: Secondary | ICD-10-CM | POA: Diagnosis not present

## 2021-02-13 MED ORDER — FLUOXETINE HCL 20 MG PO CAPS: 20.0000 mg | ORAL_CAPSULE | Freq: Every day | ORAL | 1 refills | Status: DC

## 2021-02-13 NOTE — Patient Instructions (Signed)
Good to see you today.  Follow-up in 6 weeks to determine the effectiveness of Prozac.

## 2021-02-13 NOTE — Progress Notes (Signed)
Subjective:    Chief Complaint: [name], LAT, ATC, am serving as scribe for Dr. Lynne Leader.  Alexis Ellis,  is a 30 y.o. female who presents for f/u of concussion she sustained on 01/09/21 when her child accidentally hit her in the L temple w/ a toy.  She was last seen by Dr. Georgina Snell on 01/30/21 w/ c/o HA, dizziness, radiating pain to the back of her head, nausea and blurry vision. She was prescribed Prozac to help w/ mood-related symptoms and referred to vestibular therapy, but has not yet completed nor scheduled any visits. Today, pt reports she has been feeling a lot better, 80% improvement. Pt thinks the Prozac is really helping stabilize her symptoms.  Injury date : 01/09/21 Visit #: 2  History of Present Illness:   Concussion Self-Reported Symptom Score Symptoms rated on a scale 1-6, in last 24 hours   Headache: 0    Nausea: 1  Dizziness: 1  Vomiting: 0  Balance Difficulty: 0   Trouble Falling Asleep: 0   Fatigue: 0  Sleep Less Than Usual: 0  Daytime Drowsiness: 1  Sleep More Than Usual: 0  Photophobia: 0  Phonophobia: 0  Irritability: 2  Sadness: 0  Numbness or Tingling: 0  Nervousness: 1  Feeling More Emotional: 0  Feeling Mentally Foggy: 0  Feeling Slowed Down: 0  Memory Problems: 0  Difficulty Concentrating: 1  Visual Problems: 0   Total # of Symptoms: 6/22 Total Symptom Score: 6/132  Previous Total # of Symptoms: 16/22 Previous Symptom Score: 39/132  Neck Pain: No Tinnitus: Yes- slight in L  Review of Systems:  No fever or chills    Review of History: hx of anxiety and ADHD.  Previously tolerated Prozac well.  Objective:    Physical Examination Vitals:   02/13/21 1100  BP: 120/80  Pulse: (!) 105  SpO2: 98%    Neuro: alert and oriented Psych: Normal speech thought process and affect.    Assessment and Plan   30 y.o. female with concussion.  Significant/dramatic improvement with Prozac.  Main symptoms at the last visit were  vestibular and mood.  The vestibular symptoms have resolved without any further treatment.  The mood symptoms have dramatically improved with 20 mg of Prozac.  Plan to continue Prozac 20 and recheck in 6 weeks.  Plan to continue Prozac for approximately 3 to 6 months.  At that point we may consider discontinuing it or as this was a previous long-term medicine she may want to continue it long-term at that point may be reasonable for her PCP or psychiatrist to take over this prescription.       Action/Discussion: Reviewed diagnosis, management options, expected outcomes, and the reasons for scheduled and emergent follow-up. Questions were adequately answered. Patient expressed verbal understanding and agreement with the following plan.     Patient Education: Reviewed with patient the risks (i.e, a repeat concussion, post-concussion syndrome, second-impact syndrome) of returning to play prior to complete resolution, and thoroughly reviewed the signs and symptoms of concussion.Reviewed need for complete resolution of all symptoms, with rest AND exertion, prior to return to play. Reviewed red flags for urgent medical evaluation: worsening symptoms, nausea/vomiting, intractable headache, musculoskeletal changes, focal neurological deficits. [facility] Conc[facility] Plan, which clearly outlines the plans stated above, was given to patient.   Level of service: Total encounter time 20 minutes including face-to-face time with the patient and, reviewing past medical record, and charting on the date of service.  After Visit Summary printed out and provided to patient as appropriate.  The above documentation has been reviewed and is accurate and complete Lynne Leader

## 2021-02-17 IMAGING — US US MFM FETAL BPP W/O NON-STRESS
1 series · 15 of 28 positions shown · non-contrast
Comparison: none

[Series 1: us mfm fetal bpp w/o non-stress · 30 acquisitions, 15 frames shown]
[im 1/30]
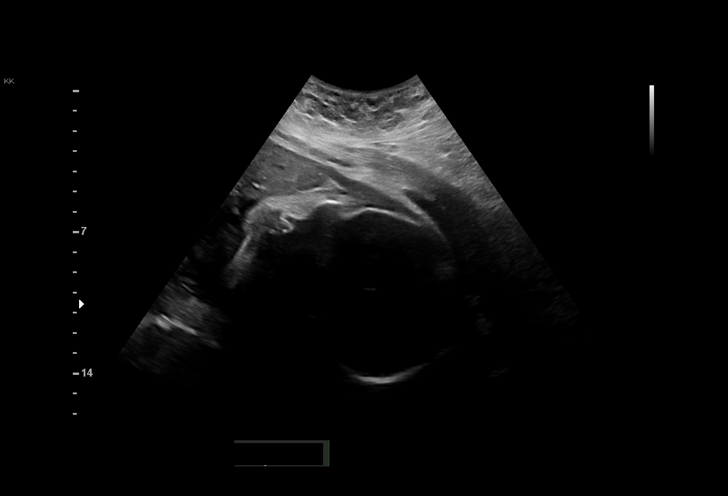
[im 3/30]
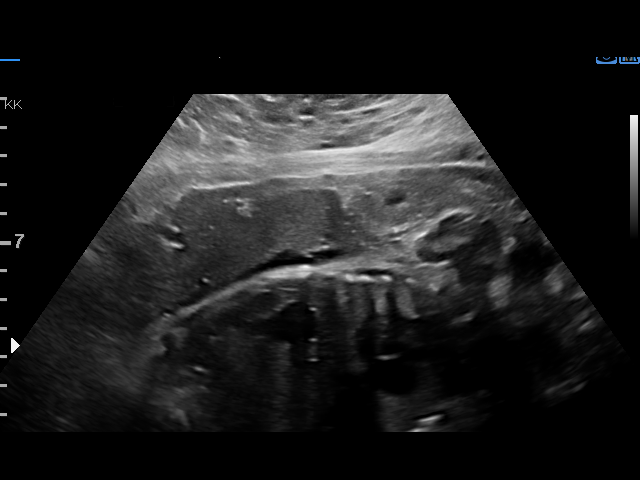
[im 5/30]
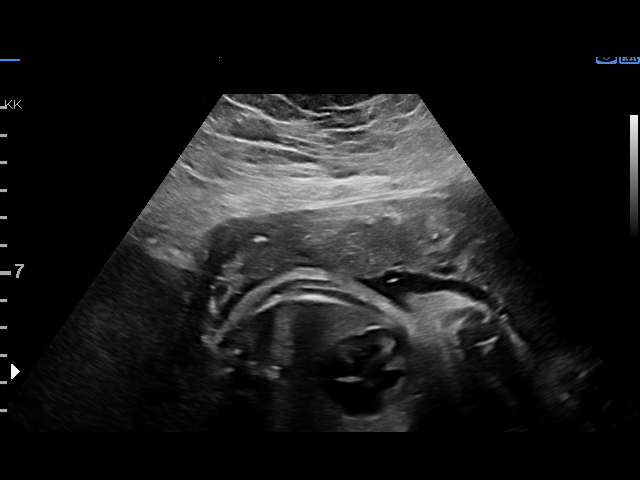
[im 7/30]
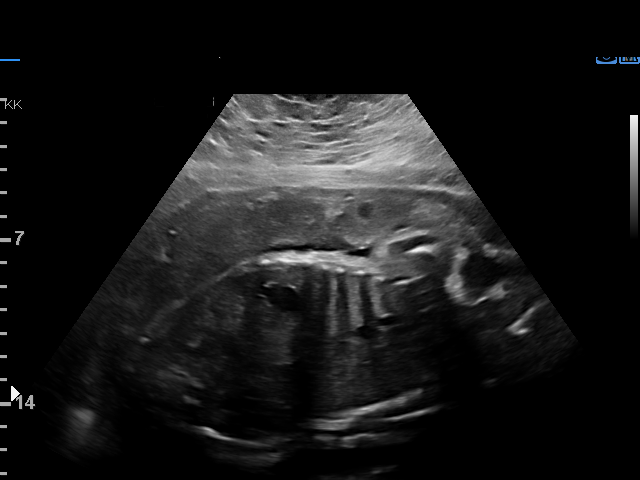
[im 9/30]
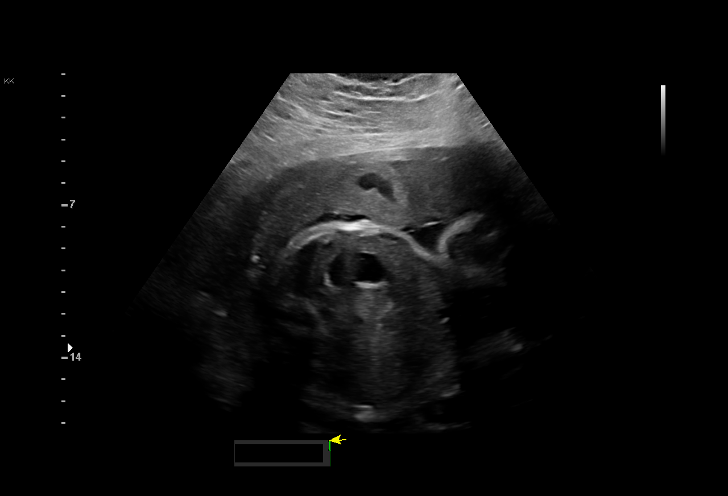
[im 11/30]
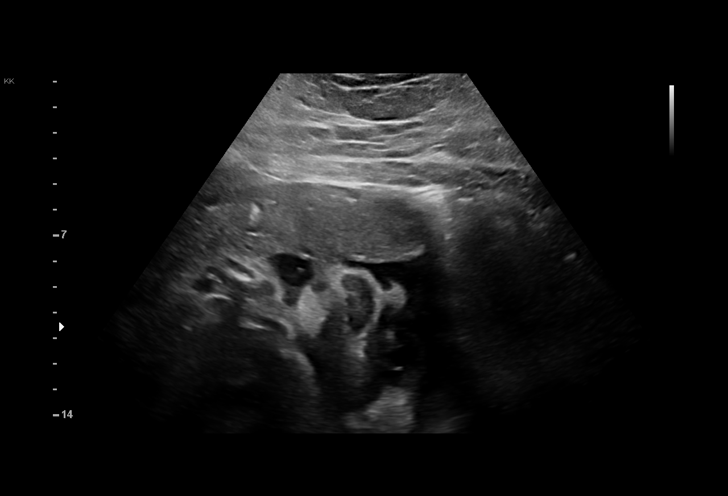
[im 13/30]
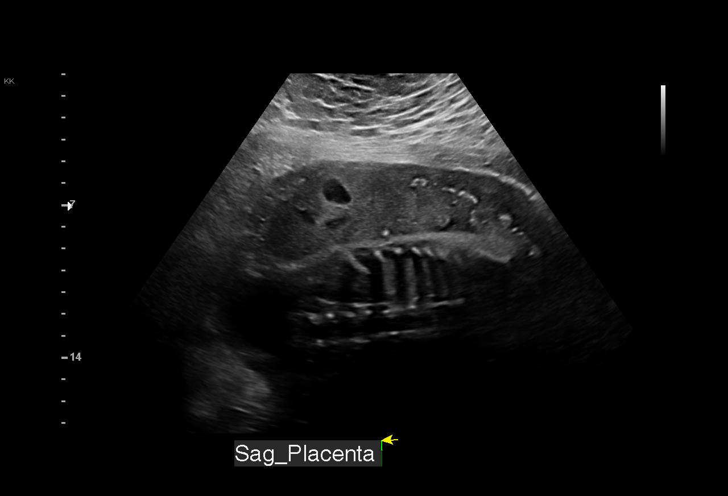
[im 16/30]
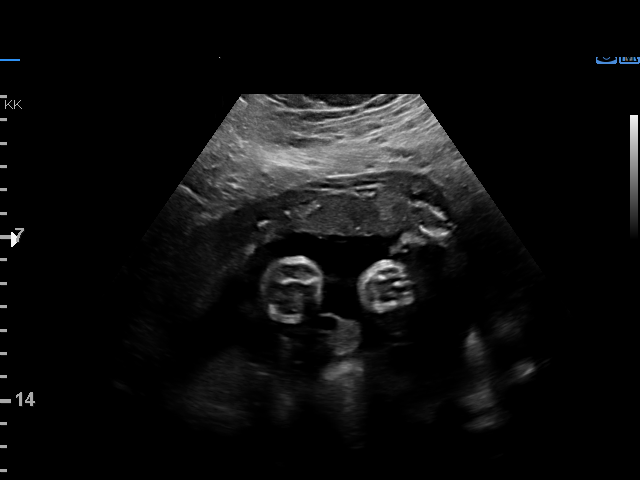
[im 17/30]
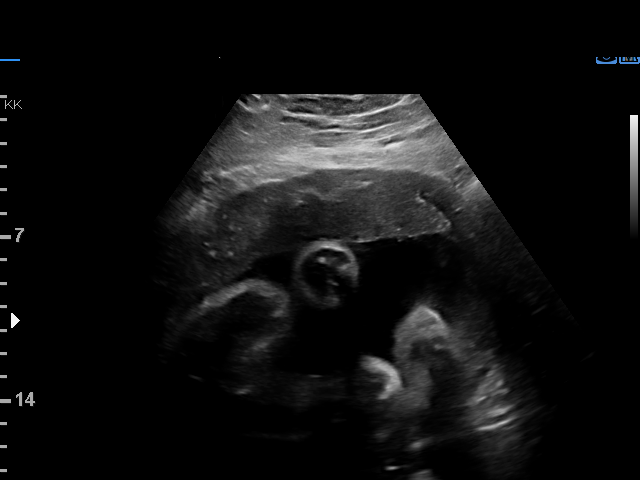
[im 19/30]
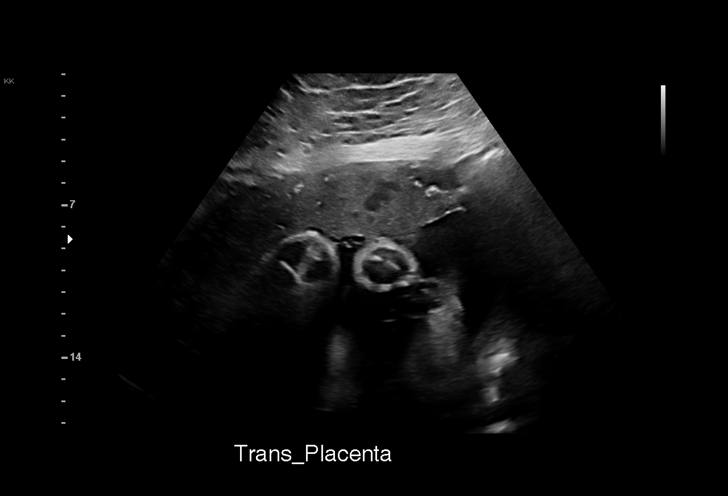
[im 21/30]
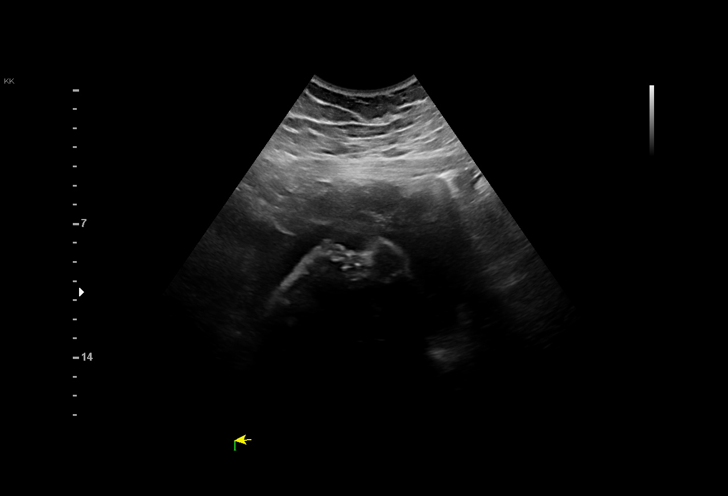
[im 23/30]
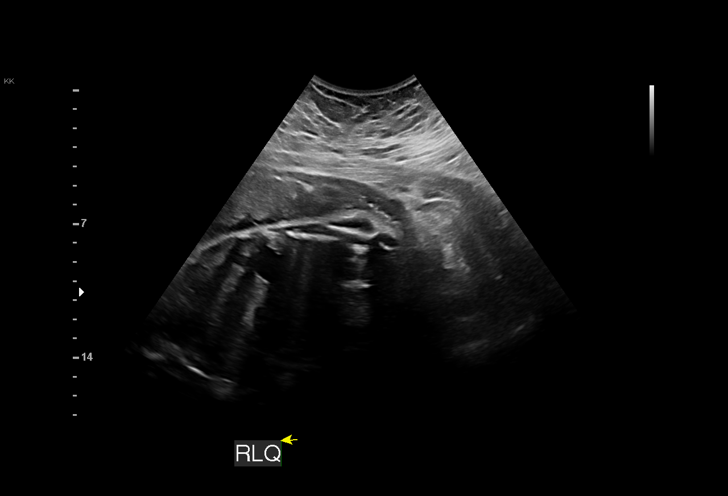
[im 25/30]
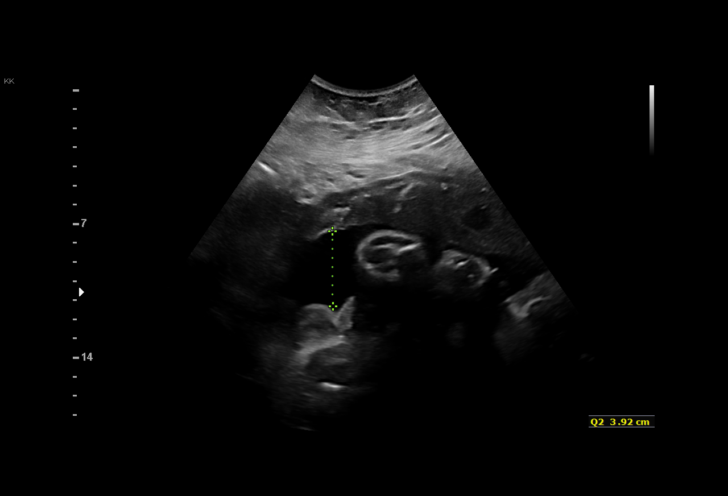
[im 27/30]
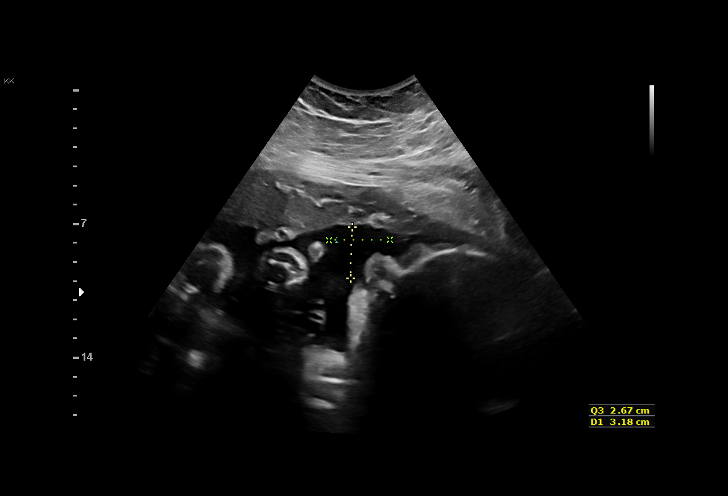
[im 30/30]
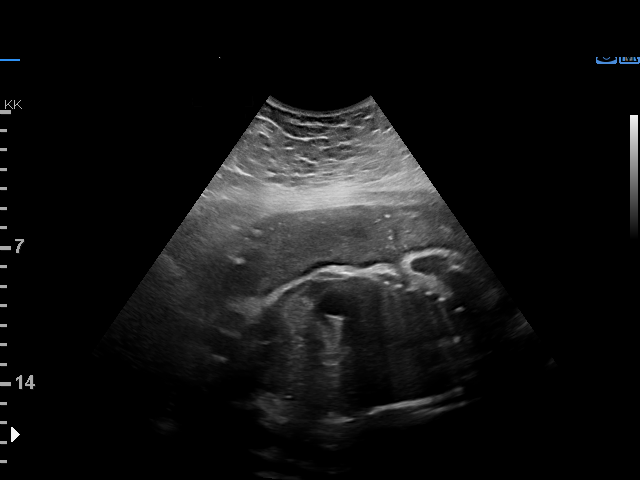

[15 of 28 positions shown; findings below may reference images not displayed]

----------------------------------------------------------------------

 ----------------------------------------------------------------------
Indications

  Hypertension - Gestational (IV Labetalol)
  Obesity complicating pregnancy (pregravid
  BMI 40)
  Poor obstetric history-Recurrent (habitual)
  abortion (4 consecutive ab's)
  Gestational diabetes in pregnancy, diet
  controlled
  32 weeks gestation of pregnancy
 ----------------------------------------------------------------------
Vital Signs

 BMI:
Fetal Evaluation

 Num Of Fetuses:         1
 Fetal Heart Rate(bpm):  150
 Cardiac Activity:       Observed
 Presentation:           Cephalic
 Placenta:               Anterior

 Amniotic Fluid
 AFI FV:      Subjectively low-normal

 AFI Sum(cm)     %Tile       Largest Pocket(cm)
 8.91            8

 RUQ(cm)                     LUQ(cm)        LLQ(cm)

Biophysical Evaluation
 Amniotic F.V:   Pocket => 2 cm             F. Tone:        Observed
 F. Movement:    Observed                   Score:          [DATE]
 F. Breathing:   Observed
OB History

 Gravidity:    5         Term:   0        Prem:   0        SAB:   3
 Ectopic:      1        Living:  0
Gestational Age

 LMP:           32w 3d        Date:  09/12/18                 EDD:   06/19/19
 Best:          32w 3d     Det. By:  LMP  (09/12/18)          EDD:   06/19/19
Impression

 Biophyscial profile [DATE]
 Gestational hypertension recently discharged, currently on
 Labetalol
 Blood pressure today 156/98 with [place] [address] she is
 asymptomatic at this time. She has a follow up appt on
 04/29/19.
 [id number]
Recommendations

 Continue weekly testing
 Consider deliveyr 37 weeks.

## 2021-02-21 ENCOUNTER — Telehealth: Payer: Medicaid Other | Admitting: Internal Medicine

## 2021-02-22 ENCOUNTER — Ambulatory Visit: Payer: Medicaid Other | Admitting: Internal Medicine

## 2021-02-22 ENCOUNTER — Other Ambulatory Visit: Payer: Self-pay

## 2021-02-22 ENCOUNTER — Encounter: Payer: Self-pay | Admitting: Internal Medicine

## 2021-02-22 VITALS — BP 119/81 | HR 105 | Temp 97.4°F | Resp 18 | Ht 65.5 in | Wt 233.6 lb

## 2021-02-22 DIAGNOSIS — F330 Major depressive disorder, recurrent, mild: Secondary | ICD-10-CM | POA: Diagnosis not present

## 2021-02-22 DIAGNOSIS — D170 Benign lipomatous neoplasm of skin and subcutaneous tissue of head, face and neck: Secondary | ICD-10-CM | POA: Diagnosis not present

## 2021-02-22 MED ORDER — LISINOPRIL-HYDROCHLOROTHIAZIDE 10-12.5 MG PO TABS: 1.0000 | ORAL_TABLET | Freq: Every day | ORAL | 0 refills | Status: DC

## 2021-02-22 NOTE — Patient Instructions (Signed)
Lipoma  A lipoma is a noncancerous (benign) tumor that is made up of fat cells. This is a very common type of soft-tissue growth. Lipomas are usually found under the skin (subcutaneous). They may occur in any tissue of the body that contains fat. Common areas forlipomas to appear include the back, arms, shoulders, buttocks, and thighs. Lipomas grow slowly, and they are usually painless. Most lipomas do not causeproblems and do not require treatment. What are the causes? The cause of this condition is not known. What increases the risk? You are more likely to develop this condition if: You are 40-60 years old. You have a family history of lipomas. What are the signs or symptoms? A lipoma usually appears as a small, round bump under the skin. In most cases, the lump will: Feel soft or rubbery. Not cause pain or other symptoms. However, if a lipoma is located in an area where it pushes on nerves, it canbecome painful or cause other symptoms. How is this diagnosed? A lipoma can usually be diagnosed with a physical exam. You may also have tests to confirm the diagnosis and to rule out other conditions. Tests may include: Imaging tests, such as a CT scan or an MRI. Removal of a tissue sample to be looked at under a microscope (biopsy). How is this treated? Treatment for this condition depends on the size of the lipoma and whether it is causing any symptoms. For small lipomas that are not causing problems, no treatment is needed. If a lipoma is bigger or it causes problems, surgery may be done to remove the lipoma. Lipomas can also be removed to improve appearance. Most often, the procedure is done after applying a medicine that numbs the area (local anesthetic). Liposuction may be done to reduce the size of the lipoma before it is removed through surgery, or it may be done to remove the lipoma. Lipomas are removed with this method in order to limit incision size and scarring. A liposuction tube is  inserted through a small incision into the lipoma, and the contents of the lipoma are removed through the tube with suction. Follow these instructions at home: Watch your lipoma for any changes. Keep all follow-up visits as told by your health care provider. This is important. Contact a health care provider if: Your lipoma becomes larger or hard. Your lipoma becomes painful, red, or increasingly swollen. These could be signs of infection or a more serious condition. Get help right away if: You develop tingling or numbness in an area near the lipoma. This could indicate that the lipoma is causing nerve damage. Summary A lipoma is a noncancerous tumor that is made up of fat cells. Most lipomas do not cause problems and do not require treatment. If a lipoma is bigger or it causes problems, surgery may be done to remove the lipoma. Contact a health care provider if your lipoma becomes larger or hard, or if it becomes painful, red, or increasingly swollen. Pain, redness, and swelling could be signs of infection or a more serious condition. This information is not intended to replace advice given to you by your health care provider. Make sure you discuss any questions you have with your healthcare provider. Document Revised: 12/14/2018 Document Reviewed: 12/14/2018 Elsevier Patient Education  2022 [employer]  

## 2021-02-22 NOTE — Progress Notes (Signed)
Subjective:    Patient ID: Alexis Ellis, female    DOB: 09-16-1990, 30 y.o.   MRN: [id number]  HPI  Patient presents the clinic today with complaint of a lump above her collarbone.  She noticed this 1 month ago.  The lump is not tender or painful.  She has not noticed any drainage from the area.  She does feel like it has increased in size over the last month.  She does feel like she has had something similar to this in the past when she had a sinus infection but reports it went away with the infection.  She also reports intrusive thoughts.  She is scared that something is going to happen to her and her son.  She does have a history of ADD and depression and was recently started on Fluoxetine.  Review of Systems     Past Medical History:  Diagnosis Date   ADD (attention deficit disorder)    Chicken pox    Depression    Diet controlled gestational diabetes mellitus (GDM) in third trimester 03/23/2019   Ectopic pregnancy    Elevated blood pressure    Elevated cholesterol    Frequent headaches    Gestational hypertension 04/19/2019   History of kidney stones    Pregnancy induced hypertension     Current Outpatient Medications  Medication Sig Dispense Refill   amphetamine-dextroamphetamine (ADDERALL) 20 MG tablet Take 1 tablet by mouth 4 (four) times daily.     clonazePAM (KLONOPIN) 1 MG tablet Take 1 mg by mouth 2 (two) times daily as needed.     FLUoxetine (PROZAC) 20 MG capsule Take 1 capsule (20 mg total) by mouth daily. 90 capsule 1   fluticasone (FLONASE) 50 MCG/ACT nasal spray Place 2 sprays into both nostrils daily. 16 g 6   IBUPROFEN PO Take by mouth as needed.     lisinopril-hydrochlorothiazide (ZESTORETIC) 10-12.5 MG tablet TAKE 1 TABLET BY MOUTH EVERY DAY 90 tablet 0   No current facility-administered medications for this visit.    No Known Allergies  Family History  Problem Relation Age of Onset   Hypertension Mother        Living   Fibromyalgia Mother     Diabetes Father 75       Deceased   Heart attack Father    Sudden death Father    Bipolar disorder Maternal Grandmother    Diabetes Maternal Grandfather    Alcohol abuse Paternal Grandfather    Liver disease Paternal Grandfather    Hyperlipidemia Paternal Grandmother    Kidney disease Paternal Grandmother    Liver disease Paternal Grandmother    Heart disease Paternal Grandmother    Healthy Sister    Healthy Son     Social History   Socioeconomic History   Marital status: Single    Spouse name: Not on file   Number of children: Not on file   Years of education: Not on file   Highest education level: Not on file  Occupational History   Not on file  Tobacco Use   Smoking status: Former    Packs/day: 0.00    Types: Cigarettes    Quit date: 2020    Years since quitting: 2.7   Smokeless tobacco: Never  Vaping Use   Vaping Use: Never used  Substance and Sexual Activity   Alcohol use: Yes    Comment: ocassional   Drug use: No   Sexual activity: Yes    Partners: Male  Birth control/protection: None  Other Topics Concern   Not on file  Social History Narrative   Not on file   Social Determinants of Health   Financial Resource Strain: Not on file  Food Insecurity: Not on file  Transportation Needs: Not on file  Physical Activity: Not on file  Stress: Not on file  Social Connections: Not on file  Intimate Partner Violence: Not on file     Constitutional: Denies fever, malaise, fatigue, headache or abrupt weight changes.  HEENT: Denies eye pain, eye redness, ear pain, ringing in the ears, wax buildup, runny nose, nasal congestion, bloody nose, or sore throat. Respiratory: Denies difficulty breathing, shortness of breath, cough or sputum production.   Cardiovascular: Denies chest pain, chest tightness, palpitations or swelling in the hands or feet.  Skin: Patient reports a lump above her collarbone.  Denies redness, rashes, or ulcercations.  Neurological: Denies  dizziness, difficulty with memory, difficulty with speech or problems with balance and coordination.  Psych: Patient reports intrusive thoughts, has a history of depression.  Denies anxiety, SI/HI.  No other specific complaints in a complete review of systems (except as listed in HPI above).  Objective:   Physical Exam  BP 119/81 (BP Location: Right Arm, Patient Position: Sitting, Cuff Size: Large)   Pulse (!) 105   Temp (!) 97.4 F (36.3 C) (Temporal)   Resp 18   Ht 5' 5.5" (1.664 m)   Wt 233 lb 9.6 oz (106 kg)   LMP 02/20/2021   SpO2 100%   Breastfeeding No   BMI 38.28 kg/m   Wt Readings from Last 3 Encounters:  02/13/21 235 lb (106.6 kg)  01/30/21 235 lb 9.6 oz (106.9 kg)  12/27/20 235 lb 6.4 oz (106.8 kg)    General: Appears her stated age, obese, in NAD. Skin: Warm, dry and intact.  Lipoma noted above right collarbone. HEENT: Head: normal shape and size; Eyes: EOMs intact;  Cardiovascular: Tachycardic. Pulmonary/Chest: Normal effort and positive vesicular breath sounds.  Neurological: Alert and oriented.  Psychiatric: Mood and affect normal.  Tearful. Judgment and thought content normal.     BMET    Component Value Date/Time   NA 136 12/26/2020 1426   K 4.2 12/26/2020 1426   CL 100 12/26/2020 1426   CO2 28 12/26/2020 1426   GLUCOSE 144 (H) 12/26/2020 1426   BUN 11 12/26/2020 1426   CREATININE 0.80 12/26/2020 1426   CREATININE 1.00 06/05/2015 2228   CALCIUM 9.8 12/26/2020 1426   GFRNONAA >60 12/26/2020 1426   GFRAA >60 05/01/2019 1723    Lipid Panel     Component Value Date/Time   CHOL 234 (H) 03/27/2020 1227   TRIG 134.0 03/27/2020 1227   HDL 43.20 03/27/2020 1227   CHOLHDL 5 03/27/2020 1227   VLDL 26.8 03/27/2020 1227   LDLCALC 164 (H) 03/27/2020 1227    CBC    Component Value Date/Time   WBC 8.9 12/26/2020 1426   RBC 5.26 (H) 12/26/2020 1426   HGB 15.8 (H) 12/26/2020 1426   HGB 12.7 03/22/2019 0930   HCT 45.8 12/26/2020 1426   HCT 37.7  03/22/2019 0930   PLT 314 12/26/2020 1426   PLT 280 03/22/2019 0930   MCV 87.1 12/26/2020 1426   MCV 84 03/22/2019 0930   MCH 30.0 12/26/2020 1426   MCHC 34.5 12/26/2020 1426   RDW 12.5 12/26/2020 1426   RDW 12.8 03/22/2019 0930   LYMPHSABS 3.1 12/26/2020 1426   LYMPHSABS 2.3 11/30/2018 0955   MONOABS  0.5 12/26/2020 1426   EOSABS 0.2 12/26/2020 1426   EOSABS 0.1 11/30/2018 0955   BASOSABS 0.1 12/26/2020 1426   BASOSABS 0.0 11/30/2018 0955    Hgb A1C Lab Results  Component Value Date   HGBA1C 5.3 03/27/2020            Assessment & Plan:   Lipoma:  Advised her this is a benign tumor Discussed ultrasound versus CT soft tissue head and neck for further evaluation if she was concerned about it Is not causing pain or decreased range of motion at this time Will hold off on referral to general surgery for surgical excision unless it begins to bother her  Intrusive Thoughts, Depression:  She has only been on fluoxetine 2 weeks Advised her to give this more time but we may need to do a dose adjustment Support offered  Return precautions discussed  Webb Silversmith, NP This visit occurred during the SARS-CoV-2 public health emergency.  Safety protocols were in place, including screening questions prior to the visit, additional usage of staff PPE, and extensive cleaning of exam room while observing appropriate contact time as indicated for disinfecting solutions.

## 2021-02-23 ENCOUNTER — Ambulatory Visit: Payer: Medicaid Other | Admitting: Internal Medicine

## 2021-02-26 ENCOUNTER — Ambulatory Visit: Payer: Medicaid Other | Admitting: Internal Medicine

## 2021-02-27 ENCOUNTER — Ambulatory Visit: Payer: Medicaid Other | Admitting: Physical Therapy

## 2021-03-21 ENCOUNTER — Other Ambulatory Visit: Payer: Self-pay

## 2021-03-21 ENCOUNTER — Telehealth: Payer: Self-pay

## 2021-03-21 ENCOUNTER — Ambulatory Visit (INDEPENDENT_AMBULATORY_CARE_PROVIDER_SITE_OTHER): Payer: Medicaid Other | Admitting: Internal Medicine

## 2021-03-21 ENCOUNTER — Encounter: Payer: Self-pay | Admitting: Internal Medicine

## 2021-03-21 VITALS — BP 132/81 | HR 93 | Resp 17 | Ht 65.5 in | Wt 237.2 lb

## 2021-03-21 DIAGNOSIS — Z6838 Body mass index (BMI) 38.0-38.9, adult: Secondary | ICD-10-CM | POA: Diagnosis not present

## 2021-03-21 DIAGNOSIS — Z0001 Encounter for general adult medical examination with abnormal findings: Secondary | ICD-10-CM | POA: Diagnosis not present

## 2021-03-21 NOTE — Progress Notes (Signed)
Subjective:    Patient ID: Alexis Ellis, female    DOB: October 15, 1990, 30 y.o.   MRN: [id number]  HPI  Patient presents the clinic today for her annual exam. Flu: never Tetanus: 03/2019 Covid: never Pap smear: 11/2018 abnormal Dentist: annually  Diet: She does eat meat. She does eat fruits and veggies. She does eat some fried foods. She drinks mostly water and sweet tea. Exercise: Yoga  Review of Systems     Past Medical History:  Diagnosis Date   ADD (attention deficit disorder)    Chicken pox    Depression    Diet controlled gestational diabetes mellitus (GDM) in third trimester 03/23/2019   Ectopic pregnancy    Elevated blood pressure    Elevated cholesterol    Frequent headaches    Gestational hypertension 04/19/2019   History of kidney stones    Pregnancy induced hypertension     Current Outpatient Medications  Medication Sig Dispense Refill   amphetamine-dextroamphetamine (ADDERALL) 20 MG tablet Take 1 tablet by mouth 4 (four) times daily.     clonazePAM (KLONOPIN) 1 MG tablet Take 1 mg by mouth 2 (two) times daily as needed.     FLUoxetine (PROZAC) 20 MG capsule Take 1 capsule (20 mg total) by mouth daily. 90 capsule 1   fluticasone (FLONASE) 50 MCG/ACT nasal spray Place 2 sprays into both nostrils daily. 16 g 6   IBUPROFEN PO Take by mouth as needed.     lisinopril-hydrochlorothiazide (ZESTORETIC) 10-12.5 MG tablet Take 1 tablet by mouth daily. (Patient not taking: Reported on 03/21/2021) 90 tablet 0   No current facility-administered medications for this visit.    No Known Allergies  Family History  Problem Relation Age of Onset   Hypertension Mother        Living   Fibromyalgia Mother    Diabetes Father 109       Deceased   Heart attack Father    Sudden death Father    Bipolar disorder Maternal Grandmother    Diabetes Maternal Grandfather    Alcohol abuse Paternal Grandfather    Liver disease Paternal Grandfather    Hyperlipidemia Paternal  Grandmother    Kidney disease Paternal Grandmother    Liver disease Paternal Grandmother    Heart disease Paternal Grandmother    Healthy Sister    Healthy Son     Social History   Socioeconomic History   Marital status: Single    Spouse name: Not on file   Number of children: Not on file   Years of education: Not on file   Highest education level: Not on file  Occupational History   Not on file  Tobacco Use   Smoking status: Former    Packs/day: 0.00    Types: Cigarettes    Quit date: 2020    Years since quitting: 2.8   Smokeless tobacco: Never  Vaping Use   Vaping Use: Never used  Substance and Sexual Activity   Alcohol use: Yes    Comment: ocassional   Drug use: No   Sexual activity: Yes    Partners: Male    Birth control/protection: None  Other Topics Concern   Not on file  Social History Narrative   Not on file   Social Determinants of Health   Financial Resource Strain: Not on file  Food Insecurity: Not on file  Transportation Needs: Not on file  Physical Activity: Not on file  Stress: Not on file  Social Connections: Not on file  Intimate Partner Violence: Not on file     Constitutional: Patient reports intermittent headaches.  Denies fever, malaise, fatigue, or abrupt weight changes.  HEENT: Denies eye pain, eye redness, ear pain, ringing in the ears, wax buildup, runny nose, nasal congestion, bloody nose, or sore throat. Respiratory: Denies difficulty breathing, shortness of breath, cough or sputum production.   Cardiovascular: Denies chest pain, chest tightness, palpitations or swelling in the hands or feet.  Gastrointestinal: Denies abdominal pain, bloating, constipation, diarrhea or blood in the stool.  GU: Denies urgency, frequency, pain with urination, burning sensation, blood in urine, odor or discharge. Musculoskeletal: Denies decrease in range of motion, difficulty with gait, muscle pain or joint pain and swelling.  Skin: Denies redness,  rashes, lesions or ulcercations.  Neurological: Patient reports insomnia, inattention.  Denies dizziness, difficulty with memory, difficulty with speech or problems with balance and coordination.  Psych: Patient has a history of depression.  Denies anxiety, SI/HI.  No other specific complaints in a complete review of systems (except as listed in HPI above).  Objective:   Physical Exam   BP 132/81 (BP Location: Right Arm, Patient Position: Sitting, Cuff Size: Large)   Pulse 93   Resp 17   Ht 5' 5.5" (1.664 m)   Wt 237 lb 3.2 oz (107.6 kg)   LMP 02/20/2021   SpO2 100%   BMI 38.87 kg/m  Wt Readings from Last 3 Encounters:  03/21/21 237 lb 3.2 oz (107.6 kg)  02/22/21 233 lb 9.6 oz (106 kg)  02/13/21 235 lb (106.6 kg)    General: Appears her stated age, obese, in NAD. Skin: Warm, dry and intact.  Lipoma noted of neck. HEENT: Head: normal shape and size; Eyes: sclera white and EOMs intact;  Neck:  Neck supple, trachea midline. No masses, lumps or thyromegaly present.  Cardiovascular: Normal rate and rhythm. S1,S2 noted.  No murmur, rubs or gallops noted. No JVD or BLE edema.  Pulmonary/Chest: Normal effort and positive vesicular breath sounds. No respiratory distress. No wheezes, rales or ronchi noted.  Abdomen: Soft and nontender. Normal bowel sounds. No distention or masses noted. Liver, spleen and kidneys non palpable. Musculoskeletal: Strength 5/5 BUE/BLE.  No difficulty with gait.  Neurological: Alert and oriented. Cranial nerves II-XII grossly intact. Coordination normal.  Psychiatric: Mood and affect flat. Behavior is normal. Judgment and thought content normal.     BMET    Component Value Date/Time   NA 136 12/26/2020 1426   K 4.2 12/26/2020 1426   CL 100 12/26/2020 1426   CO2 28 12/26/2020 1426   GLUCOSE 144 (H) 12/26/2020 1426   BUN 11 12/26/2020 1426   CREATININE 0.80 12/26/2020 1426   CREATININE 1.00 06/05/2015 2228   CALCIUM 9.8 12/26/2020 1426   GFRNONAA >60  12/26/2020 1426   GFRAA >60 05/01/2019 1723    Lipid Panel     Component Value Date/Time   CHOL 234 (H) 03/27/2020 1227   TRIG 134.0 03/27/2020 1227   HDL 43.20 03/27/2020 1227   CHOLHDL 5 03/27/2020 1227   VLDL 26.8 03/27/2020 1227   LDLCALC 164 (H) 03/27/2020 1227    CBC    Component Value Date/Time   WBC 8.9 12/26/2020 1426   RBC 5.26 (H) 12/26/2020 1426   HGB 15.8 (H) 12/26/2020 1426   HGB 12.7 03/22/2019 0930   HCT 45.8 12/26/2020 1426   HCT 37.7 03/22/2019 0930   PLT 314 12/26/2020 1426   PLT 280 03/22/2019 0930   MCV 87.1 12/26/2020 1426  MCV 84 03/22/2019 0930   MCH 30.0 12/26/2020 1426   MCHC 34.5 12/26/2020 1426   RDW 12.5 12/26/2020 1426   RDW 12.8 03/22/2019 0930   LYMPHSABS 3.1 12/26/2020 1426   LYMPHSABS 2.3 11/30/2018 0955   MONOABS 0.5 12/26/2020 1426   EOSABS 0.2 12/26/2020 1426   EOSABS 0.1 11/30/2018 0955   BASOSABS 0.1 12/26/2020 1426   BASOSABS 0.0 11/30/2018 0955    Hgb A1C Lab Results  Component Value Date   HGBA1C 5.3 03/27/2020           Assessment & Plan:   Preventative Health Maintenance:  She declines flu shot Tetanus UTD Encouraged her to get her COVID-vaccine Pap smear due but she will have to reschedule this as she was running a little late today Encouraged her to consume a balanced diet and exercise regimen Advised her to see a dentist annually We will check CBC, c-Met, lipid, TSH and A1c today  RTC in 6 months, follow-up chronic conditions  This visit occurred during the SARS-CoV-2 public health emergency.  Safety protocols were in place, including screening questions prior to the visit, additional usage of staff PPE, and extensive cleaning of exam room while observing appropriate contact time as indicated for disinfecting solutions.

## 2021-03-21 NOTE — Telephone Encounter (Signed)
Copied from [place] [phone]. Topic: General - Inquiry >> Mar 21, 2021  9:37 AM Scherrie Gerlach wrote: Reason for CRM: pt called 9:36.  Had a flat tire, but should be here by 10 am

## 2021-03-22 ENCOUNTER — Encounter: Payer: Self-pay | Admitting: Internal Medicine

## 2021-03-22 LAB — LIPID PANEL
Cholesterol: 224 mg/dL — ABNORMAL HIGH (ref <200)
HDL: 43 mg/dL — ABNORMAL LOW (ref >=50)
LDL Cholesterol (Calc): 151 mg/dL (calc) — ABNORMAL HIGH
Non-HDL Cholesterol (Calc): 181 mg/dL (calc) — ABNORMAL HIGH (ref <130)
Total CHOL/HDL Ratio: 5.2 (calc) — ABNORMAL HIGH (ref <5.0)
Triglycerides: 167 mg/dL — ABNORMAL HIGH (ref <150)

## 2021-03-22 LAB — COMPLETE METABOLIC PANEL WITH GFR
AG Ratio: 1.8 (calc) (ref 1.0–2.5)
ALT: 33 U/L — ABNORMAL HIGH (ref 6–29)
AST: 23 U/L (ref 10–30)
Albumin: 4.4 g/dL (ref 3.6–5.1)
Alkaline phosphatase (APISO): 76 U/L (ref 31–125)
BUN: 12 mg/dL (ref 7–25)
CO2: 29 mmol/L (ref 20–32)
Calcium: 9.6 mg/dL (ref 8.6–10.2)
Chloride: 102 mmol/L (ref 98–110)
Creat: 0.8 mg/dL (ref 0.50–0.97)
Globulin: 2.5 g/dL (calc) (ref 1.9–3.7)
Glucose, Bld: 88 mg/dL (ref 65–99)
Potassium: 4.6 mmol/L (ref 3.5–5.3)
Sodium: 137 mmol/L (ref 135–146)
Total Bilirubin: 0.8 mg/dL (ref 0.2–1.2)
Total Protein: 6.9 g/dL (ref 6.1–8.1)
eGFR: 100 mL/min/{1.73_m2} (ref >=60)

## 2021-03-22 LAB — CBC
HCT: 44.9 % (ref 35.0–45.0)
Hemoglobin: 15.1 g/dL (ref 11.7–15.5)
MCH: 29.7 pg (ref 27.0–33.0)
MCHC: 33.6 g/dL (ref 32.0–36.0)
MCV: 88.4 fL (ref 80.0–100.0)
MPV: 9.6 fL (ref 7.5–12.5)
Platelets: 314 10*3/uL (ref 140–400)
RBC: 5.08 10*6/uL (ref 3.80–5.10)
RDW: 12.3 % (ref 11.0–15.0)
WBC: 6.9 10*3/uL (ref 3.8–10.8)

## 2021-03-22 LAB — HEMOGLOBIN A1C
Hgb A1c MFr Bld: 5 % of total Hgb (ref <5.7)
Mean Plasma Glucose: 97 mg/dL
eAG (mmol/L): 5.4 mmol/L

## 2021-03-22 LAB — TSH: TSH: 0.99 mIU/L

## 2021-03-22 NOTE — Assessment & Plan Note (Signed)
Encourage diet and exercise for weight loss 

## 2021-03-22 NOTE — Patient Instructions (Signed)

## 2021-03-23 MED ORDER — ATORVASTATIN CALCIUM 10 MG PO TABS: 10.0000 mg | ORAL_TABLET | Freq: Every day | ORAL | 0 refills | Status: DC

## 2021-03-26 NOTE — Progress Notes (Deleted)
   I, Wendy Poet, LAT, ATC, am serving as scribe for Dr. Lynne Leader.  Alexis Ellis is a 30 y.o. female who presents to [facility] today for /u of concussion she sustained on 01/09/21 when her child accidentally hit her in the L temple w/ a toy.  She was last seen by Dr. Georgina Snell on 02/13/21 and noted 80% improvement in her symptoms.  She was taking the Prozac previously prescribed and felt this medication was helping to stabilize her symptoms.  Today, pt reports   Injury date : 01/09/21 Visit #: 3  Previous Total # of Symptoms: 6/22 Previous Total Symptom Score: 6/132  Pertinent review of systems: ***  Relevant historical information: ***   Exam:  There were no vitals taken for this visit. General: Well Developed, well nourished, and in no acute distress.   MSK: ***    Lab and Radiology Results No results found for this or any previous visit (from the past 72 hour(s)). No results found.     Assessment and Plan: 30 y.o. female with ***   PDMP not reviewed this encounter. No orders of the defined types were placed in this encounter.  No orders of the defined types were placed in this encounter.    Discussed warning signs or symptoms. Please see discharge instructions. Patient expresses understanding.   ***

## 2021-03-27 ENCOUNTER — Ambulatory Visit: Payer: Medicaid Other | Admitting: Family Medicine

## 2021-03-30 ENCOUNTER — Encounter: Payer: Self-pay | Admitting: Internal Medicine

## 2021-04-12 ENCOUNTER — Encounter: Payer: Medicaid Other | Admitting: Internal Medicine

## 2021-04-12 ENCOUNTER — Other Ambulatory Visit: Payer: Self-pay

## 2021-04-16 ENCOUNTER — Encounter: Payer: Self-pay | Admitting: Internal Medicine

## 2021-04-17 ENCOUNTER — Other Ambulatory Visit: Payer: Self-pay | Admitting: Internal Medicine

## 2021-04-20 ENCOUNTER — Other Ambulatory Visit: Payer: Self-pay

## 2021-04-20 ENCOUNTER — Encounter: Payer: Self-pay | Admitting: Internal Medicine

## 2021-04-20 ENCOUNTER — Ambulatory Visit (INDEPENDENT_AMBULATORY_CARE_PROVIDER_SITE_OTHER): Payer: Medicaid Other | Admitting: Internal Medicine

## 2021-04-20 ENCOUNTER — Other Ambulatory Visit (HOSPITAL_COMMUNITY)
Admission: RE | Admit: 2021-04-20 | Discharge: 2021-04-20 | Disposition: A | Discharge status: Home / Self Care | Payer: Medicaid Other | Source: Ambulatory Visit | Attending: Internal Medicine | Admitting: Internal Medicine

## 2021-04-20 VITALS — BP 106/79 | HR 106 | Temp 97.5°F | Resp 17 | Ht 65.5 in | Wt 233.0 lb

## 2021-04-20 DIAGNOSIS — Z124 Encounter for screening for malignant neoplasm of cervix: Secondary | ICD-10-CM

## 2021-04-20 DIAGNOSIS — H6121 Impacted cerumen, right ear: Secondary | ICD-10-CM | POA: Diagnosis not present

## 2021-04-20 DIAGNOSIS — Z01419 Encounter for gynecological examination (general) (routine) without abnormal findings: Secondary | ICD-10-CM | POA: Insufficient documentation

## 2021-04-20 NOTE — Progress Notes (Signed)
Subjective:    Patient ID: Alexis Ellis, female    DOB: [birth year], 30 y.o.   MRN: [id number]  HPI  Pt presents to the clinic today for her pap smear. Her last pap was 11/2018, LSIL/CIN1.  She also reports right ear pain. This started a week ago. She describes the pain as sharp. She denies drainage or loss of hearing. She reports associated runny nose and scratchy throat. She has not tried anything OTC for this.  Review of Systems     Past Medical History:  Diagnosis Date   ADD (attention deficit disorder)    Chicken pox    Depression    Diet controlled gestational diabetes mellitus (GDM) in third trimester 03/23/2019   Ectopic pregnancy    Elevated blood pressure    Elevated cholesterol    Frequent headaches    Gestational hypertension 04/19/2019   History of kidney stones    Pregnancy induced hypertension     Current Outpatient Medications  Medication Sig Dispense Refill   amphetamine-dextroamphetamine (ADDERALL) 20 MG tablet Take 1 tablet by mouth 4 (four) times daily.     atorvastatin (LIPITOR) 10 MG tablet TAKE 1 TABLET BY MOUTH EVERY DAY 30 tablet 0   clonazePAM (KLONOPIN) 1 MG tablet Take 1 mg by mouth 2 (two) times daily as needed.     FLUoxetine (PROZAC) 20 MG capsule Take 1 capsule (20 mg total) by mouth daily. 90 capsule 1   fluticasone (FLONASE) 50 MCG/ACT nasal spray Place 2 sprays into both nostrils daily. 16 g 6   IBUPROFEN PO Take by mouth as needed.     No current facility-administered medications for this visit.    No Known Allergies  Family History  Problem Relation Age of Onset   Hypertension Mother        Living   Fibromyalgia Mother    Diabetes Father 68       Deceased   Heart attack Father    Sudden death Father    Bipolar disorder Maternal Grandmother    Diabetes Maternal Grandfather    Alcohol abuse Paternal Grandfather    Liver disease Paternal Grandfather    Hyperlipidemia Paternal Grandmother    Kidney disease Paternal  Grandmother    Liver disease Paternal Grandmother    Heart disease Paternal Grandmother    Healthy Sister    Healthy Son     Social History   Socioeconomic History   Marital status: Single    Spouse name: Not on file   Number of children: Not on file   Years of education: Not on file   Highest education level: Not on file  Occupational History   Not on file  Tobacco Use   Smoking status: Former    Packs/day: 0.00    Types: Cigarettes    Quit date: 2020    Years since quitting: 2.9   Smokeless tobacco: Never  Vaping Use   Vaping Use: Never used  Substance and Sexual Activity   Alcohol use: Yes    Comment: ocassional   Drug use: No   Sexual activity: Yes    Partners: Male    Birth control/protection: None  Other Topics Concern   Not on file  Social History Narrative   Not on file   Social Determinants of Health   Financial Resource Strain: Not on file  Food Insecurity: Not on file  Transportation Needs: Not on file  Physical Activity: Not on file  Stress: Not on file  Social Connections:  Not on file  Intimate Partner Violence: Not on file     Constitutional: Denies fever, malaise, fatigue, headache or abrupt weight changes.  HEENT: Pt reports right ear pain, runny nose and scratchy throat. Denies eye pain, eye redness, ringing in the ears, wax buildup, nasal congestion, bloody nose. Respiratory: Denies difficulty breathing, shortness of breath, cough or sputum production.   Cardiovascular: Denies chest pain, chest tightness, palpitations or swelling in the hands or feet.  Gastrointestinal: Denies abdominal pain, bloating, constipation, diarrhea or blood in the stool.  GU: Denies urgency, frequency, pain with urination, burning sensation, blood in urine, odor or discharge.  No other specific complaints in a complete review of systems (except as listed in HPI above).  Objective:   Physical Exam BP 106/79 (BP Location: Right Arm, Patient Position: Sitting, Cuff  Size: Large)   Pulse (!) 106   Temp (!) 97.5 F (36.4 C) (Temporal)   Resp 17   Ht 5' 5.5" (1.664 m)   Wt 233 lb (105.7 kg)   SpO2 100%   BMI 38.18 kg/m   Wt Readings from Last 3 Encounters:  03/21/21 237 lb 3.2 oz (107.6 kg)  02/22/21 233 lb 9.6 oz (106 kg)  02/13/21 235 lb (106.6 kg)    General: Appears her stated age, obese, in NAD. HEENT: Head: normal shape and size; Ears: cerumen impaction on right;  Neck:  No adenopathy noted. Cardiovascular: Normal rate and rhythm. S1,S2 noted.  No murmur, rubs or gallops noted. No JVD or BLE edema. No carotid bruits noted. Pulmonary/Chest: Normal effort and positive vesicular breath sounds. No respiratory distress. No wheezes, rales or ronchi noted.  Pelvic: Normal female anatomy. Cervix without lesion. No discharge noted. Adnexa nonpalpable. Neurological: Alert and oriented.   BMET    Component Value Date/Time   NA 137 03/21/2021 1031   K 4.6 03/21/2021 1031   CL 102 03/21/2021 1031   CO2 29 03/21/2021 1031   GLUCOSE 88 03/21/2021 1031   BUN 12 03/21/2021 1031   CREATININE 0.80 03/21/2021 1031   CALCIUM 9.6 03/21/2021 1031   GFRNONAA >60 12/26/2020 1426   GFRAA >60 05/01/2019 1723    Lipid Panel     Component Value Date/Time   CHOL 224 (H) 03/21/2021 1031   TRIG 167 (H) 03/21/2021 1031   HDL 43 (L) 03/21/2021 1031   CHOLHDL 5.2 (H) 03/21/2021 1031   VLDL 26.8 03/27/2020 1227   LDLCALC 151 (H) 03/21/2021 1031    CBC    Component Value Date/Time   WBC 6.9 03/21/2021 1031   RBC 5.08 03/21/2021 1031   HGB 15.1 03/21/2021 1031   HGB 12.7 03/22/2019 0930   HCT 44.9 03/21/2021 1031   HCT 37.7 03/22/2019 0930   PLT 314 03/21/2021 1031   PLT 280 03/22/2019 0930   MCV 88.4 03/21/2021 1031   MCV 84 03/22/2019 0930   MCH 29.7 03/21/2021 1031   MCHC 33.6 03/21/2021 1031   RDW 12.3 03/21/2021 1031   RDW 12.8 03/22/2019 0930   LYMPHSABS 3.1 12/26/2020 1426   LYMPHSABS 2.3 11/30/2018 0955   MONOABS 0.5 12/26/2020 1426    EOSABS 0.2 12/26/2020 1426   EOSABS 0.1 11/30/2018 0955   BASOSABS 0.1 12/26/2020 1426   BASOSABS 0.0 11/30/2018 0955    Hgb A1C Lab Results  Component Value Date   HGBA1C 5.0 03/21/2021           Assessment & Plan:   Screen for Cervical Cancer with Routine GYN Exam:  Pap smear  today  Cerumen Impaction, Right:  Manual lavage by CMA Start Debrox OTC 2 x week to prevent wax buildup  Will follow up after labs with further recommendation and treatment plan  Webb Silversmith, NP This visit occurred during the SARS-CoV-2 public health emergency.  Safety protocols were in place, including screening questions prior to the visit, additional usage of staff PPE, and extensive cleaning of exam room while observing appropriate contact time as indicated for disinfecting solutions.

## 2021-04-20 NOTE — Patient Instructions (Signed)
Pap Test Why am I having this test? A Pap test, also called a Pap smear, is a screening test to check for signs of: Infection. Cancer of the cervix. The cervix is the lower part of the uterus that opens into the vagina. Changes that may be a sign that cancer is developing (precancerous changes). Women need this test on a regular basis. In general, you should have a Pap test every 3 years until you reach menopause or age 32. Women aged 30-60 may choose to have their Pap test done at the same time as an HPV (human papillomavirus) test every 5 years (instead of every 3 years). Your health care provider may recommend having Pap tests more or less often depending on your medical conditions and past Pap test results. What is being tested? Cervical cells are tested for signs of infection or abnormalities. What kind of sample is taken? Your health care provider will collect a sample of cells from the surface of your cervix. This will be done using a small cotton swab, plastic spatula, or brush that is inserted into your vagina using a tool called a speculum. This sample is often collected during a pelvic exam, when you are lying on your back on an exam table with your feet in footrests (stirrups). In some cases, fluids (secretions) from the cervix or vagina may also be collected. How do I prepare for this test? Be aware of where you are in your menstrual cycle. If you are menstruating on the day of the test, you may be asked to reschedule. You may need to reschedule if you have a known vaginal infection on the day of the test. Follow instructions from your health care provider about: Changing or stopping your regular medicines. Some medicines can cause abnormal test results, such as vaginal medicines and tetracycline. Avoiding douching 2-3 days before or the day of the test. Tell a health care provider about: Any allergies you have. All medicines you are taking, including vitamins, herbs, eye drops,  creams, and over-the-counter medicines. Any bleeding problems you have. Any surgeries you have had. Any medical conditions you have. Whether you are pregnant or may be pregnant. How are the results reported? Your test results will be reported as either abnormal or normal. What do the results mean? A normal test result means that you do not have signs of cancer of the cervix. An abnormal result may mean that you have: Cancer. A Pap test by itself is not enough to diagnose cancer. You will have more tests done if cancer is suspected. Precancerous changes in your cervix. Inflammation of the cervix. An STI (sexually transmitted infection). A fungal infection. A parasite infection. Talk with your health care provider about what your results mean. In some cases, your health care provider may do more testing to confirm the results. Questions to ask your health care provider Ask your health care provider, or the department that is doing the test: When will my results be ready? How will I get my results? What are my treatment options? What other tests do I need? What are my next steps? Summary In general, women should have a Pap test every 3 years until they reach menopause or age 52. Your health care provider will collect a sample of cells from the surface of your cervix. This will be done using a small cotton swab, plastic spatula, or brush. In some cases, fluids (secretions) from the cervix or vagina may also be collected. This information is not intended  to replace advice given to you by your health care provider. Make sure you discuss any questions you have with your health care provider. Document Revised: 07/28/2020 Document Reviewed: 07/28/2020 Elsevier Patient Education  [employer].

## 2021-04-24 LAB — CYTOLOGY - PAP: Diagnosis: NEGATIVE

## 2021-04-25 ENCOUNTER — Telehealth: Payer: Self-pay

## 2021-04-25 NOTE — Telephone Encounter (Signed)
The pt was notified of her normal pap smear result.

## 2021-04-25 NOTE — Telephone Encounter (Signed)
Copied from [place] [id number]. Topic: General - Other >> Apr 24, 2021 10:43 AM Pawlus, Brayton Layman A wrote: Reason for CRM: Pt called in wanting to go over her pap smear results, please advise.

## 2021-05-02 ENCOUNTER — Other Ambulatory Visit: Payer: Self-pay | Admitting: Internal Medicine

## 2021-05-02 NOTE — Telephone Encounter (Signed)
Requested Prescriptions  Pending Prescriptions Disp Refills   atorvastatin (LIPITOR) 10 MG tablet [Pharmacy Med Name: ATORVASTATIN 10 MG TABLET] 90 tablet 1    Sig: TAKE 1 TABLET BY MOUTH EVERY DAY     Cardiovascular:  Antilipid - Statins Failed - 05/02/2021  5:47 AM      Failed - Total Cholesterol in normal range and within 360 days    Cholesterol  Date Value Ref Range Status  03/21/2021 224 (H) <200 mg/dL Final         Failed - LDL in normal range and within 360 days    LDL Cholesterol (Calc)  Date Value Ref Range Status  03/21/2021 151 (H) mg/dL (calc) Final    Comment:    Reference range: <100 . Desirable range <100 mg/dL for primary prevention;   <70 mg/dL for patients with CHD or diabetic patients  with > or = 2 CHD risk factors. Marland Kitchen LDL-C is now calculated using the Martin-Hopkins  calculation, which is a validated novel method providing  better accuracy than the Friedewald equation in the  estimation of LDL-C.  Cresenciano Genre et al. Annamaria Helling. 5498;264(15): 2061-2068  (http://education.[employer].com/faq/FAQ164)          Failed - HDL in normal range and within 360 days    HDL  Date Value Ref Range Status  03/21/2021 43 (L) > OR = 50 mg/dL Final         Failed - Triglycerides in normal range and within 360 days    Triglycerides  Date Value Ref Range Status  03/21/2021 167 (H) <150 mg/dL Final         Passed - Patient is not pregnant      Passed - Valid encounter within last 12 months    Recent Outpatient Visits          1 week ago Impacted cerumen of right ear   [place], Coralie Keens, NP   1 month ago Encounter for general adult medical examination with abnormal findings   [facility] [place], Coralie Keens, NP   2 months ago Lipoma of neck   [facility], Coralie Keens, NP   4 months ago Primary hypertension   [facility] [place], [place] W, NP   6 months ago Seasonal allergic rhinitis due to  pollen   [facility] [place], Coralie Keens, NP

## 2021-05-03 ENCOUNTER — Ambulatory Visit
Admission: EM | Admit: 2021-05-03 | Discharge: 2021-05-03 | Disposition: A | Discharge status: Home / Self Care | Payer: Medicaid Other | Attending: Internal Medicine | Admitting: Internal Medicine

## 2021-05-03 ENCOUNTER — Encounter: Payer: Self-pay | Admitting: Emergency Medicine

## 2021-05-03 ENCOUNTER — Other Ambulatory Visit: Payer: Self-pay

## 2021-05-03 DIAGNOSIS — R053 Chronic cough: Secondary | ICD-10-CM | POA: Diagnosis not present

## 2021-05-03 MED ORDER — BENZONATATE 100 MG PO CAPS: 100.0000 mg | ORAL_CAPSULE | Freq: Three times a day (TID) | ORAL | 0 refills | Status: DC | PRN

## 2021-05-03 NOTE — ED Triage Notes (Signed)
Patient is c/o non-productive cough x 2 weeks, neck and chest swelling x 1 year.  Patient states that she does not have a sore throat and her PCP says it's just post-nasal drip.  According to patient something is there.

## 2021-05-03 NOTE — ED Provider Notes (Signed)
[name]    CSN: [id number] Arrival date & time: 05/03/21  1937      History   Chief Complaint Chief Complaint  Patient presents with   Cough    HPI Alexis Ellis is a 30 y.o. female.   Patient presents with nonproductive cough that has been present for approximately 2 weeks.  Patient reports that she has also had some nasal drainage but denies nasal congestion.  Denies chest pain, shortness of breath, sore throat, ear pain, nausea, vomiting, diarrhea, abdominal pain.  Denies any known fevers or sick contacts.  She also reports "neck and chest swelling" that started approximately 1 week ago.  Denies any pain to the throat or pain to the neck.  Denies neck stiffness or limited range of motion of neck.  Patient reports that "it feels like there is swelling in my neck and is extending down to my upper chest".  Patient reports that she has taken antihistamines and Flonase for symptoms as advised by her PCP with no improvement in postnasal drip.  She reports that the postnasal drip is a chronic issue and has been present for approximately 1 year.   Cough  Past Medical History:  Diagnosis Date   ADD (attention deficit disorder)    Chicken pox    Depression    Diet controlled gestational diabetes mellitus (GDM) in third trimester 03/23/2019   Ectopic pregnancy    Elevated blood pressure    Elevated cholesterol    Frequent headaches    Gestational hypertension 04/19/2019   History of kidney stones    Pregnancy induced hypertension     Patient Active Problem List   Diagnosis Date Noted   Anxiety 12/27/2020   Class 2 obesity due to excess calories with body mass index (BMI) of 38.0 to 38.9 in adult 12/27/2020   HTN (hypertension) 03/27/2020   HLD (hyperlipidemia) 09/29/2015   ADD (attention deficit disorder) 11/07/2013    Past Surgical History:  Procedure Laterality Date   LAPAROSCOPY N/A 09/16/2012   Procedure: LAPAROSCOPY OPERATIVE;  Surgeon: Allena Katz, MD;  Location: [facility];  Service: Gynecology;  Laterality: N/A;  Evacuation Hemoperitoneum   TUBAL LIGATION  2014   UNILATERAL SALPINGECTOMY Right 09/16/2012   Procedure: UNILATERAL SALPINGECTOMY;  Surgeon: Allena Katz, MD;  Location: [facility];  Service: Gynecology;  Laterality: Right;   WISDOM TOOTH EXTRACTION      OB History     Gravida  5   Para  1   Term      Preterm  1   AB  4   Living  1      SAB  3   IAB      Ectopic  1   Multiple  0   Live Births  1            Home Medications    Prior to Admission medications   Medication Sig Start Date End Date Taking? Authorizing Provider  amphetamine-dextroamphetamine (ADDERALL) 20 MG tablet Take 1 tablet by mouth 4 (four) times daily. 03/03/20  Yes [provider]  benzonatate (TESSALON) 100 MG capsule Take 1 capsule (100 mg total) by mouth every 8 (eight) hours as needed for cough. 05/03/21  Yes , Hildred Alamin E, FNP  clonazePAM (KLONOPIN) 1 MG tablet Take 1 mg by mouth 2 (two) times daily as needed. 03/03/20  Yes [provider]  FLUoxetine (PROZAC) 20 MG capsule Take 1 capsule (20 mg total) by mouth daily.  02/13/21  Yes Gregor Hams, MD  fluticasone (FLONASE) 50 MCG/ACT nasal spray Place 2 sprays into both nostrils daily. 10/13/20  Yes Jearld Fenton, NP  IBUPROFEN PO Take by mouth as needed.   Yes [provider]  lisinopril-hydrochlorothiazide (ZESTORETIC) 10-12.5 MG tablet Take 1 tablet by mouth daily. 03/17/20  Yes [provider]  atorvastatin (LIPITOR) 10 MG tablet TAKE 1 TABLET BY MOUTH EVERY DAY 05/02/21   Jearld Fenton, NP    Family History Family History  Problem Relation Age of Onset   Hypertension Mother        Living   Fibromyalgia Mother    Diabetes Father 52       Deceased   Heart attack Father    Sudden death Father    Bipolar disorder Maternal Grandmother    Diabetes Maternal Grandfather    Alcohol abuse Paternal Grandfather    Liver  disease Paternal Grandfather    Hyperlipidemia Paternal Grandmother    Kidney disease Paternal Grandmother    Liver disease Paternal Grandmother    Heart disease Paternal Grandmother    Healthy Sister    Healthy Son     Social History Social History   Tobacco Use   Smoking status: Former    Packs/day: 0.00    Types: Cigarettes    Quit date: 2020    Years since quitting: 2.9   Smokeless tobacco: Never  Vaping Use   Vaping Use: Never used  Substance Use Topics   Alcohol use: Yes    Comment: ocassional   Drug use: No     Allergies   Patient has no known allergies.   Review of Systems Review of Systems Per HPI  Physical Exam Triage Vital Signs ED Triage Vitals  Enc Vitals Group     BP 05/03/21 1947 130/85     Pulse Rate 05/03/21 1947 (!) 102     Resp 05/03/21 1947 20     Temp 05/03/21 1947 98.5 F (36.9 C)     Temp Source 05/03/21 1947 Oral     SpO2 05/03/21 1947 98 %     Weight 05/03/21 1948 230 lb (104.3 kg)     Height 05/03/21 1948 5\' 5"  (1.651 m)     Head Circumference --      Peak Flow --      Pain Score 05/03/21 1948 1     Pain Loc --      Pain Edu? --      Excl. in [place]? --    No data found.  Updated Vital Signs BP 130/85 (BP Location: Left Arm)    Pulse (!) 102    Temp 98.5 F (36.9 C) (Oral)    Resp 20    Ht 5\' 5"  (1.651 m)    Wt 230 lb (104.3 kg)    LMP 04/13/2021    SpO2 98%    BMI 38.27 kg/m   Visual Acuity Right Eye Distance:   Left Eye Distance:   Bilateral Distance:    Right Eye Near:   Left Eye Near:    Bilateral Near:     Physical Exam Constitutional:      General: She is not in acute distress.    Appearance: Normal appearance. She is not toxic-appearing or diaphoretic.  HENT:     Head: Normocephalic and atraumatic.     Right Ear: Tympanic membrane and ear canal normal.     Left Ear: Tympanic membrane and ear canal normal.  Nose: Congestion present.     Mouth/Throat:     Mouth: Mucous membranes are moist.     Pharynx: No  posterior oropharyngeal erythema.  Eyes:     Extraocular Movements: Extraocular movements intact.     Conjunctiva/sclera: Conjunctivae normal.     Pupils: Pupils are equal, round, and reactive to light.  Cardiovascular:     Rate and Rhythm: Normal rate and regular rhythm.     Pulses: Normal pulses.     Heart sounds: Normal heart sounds.  Pulmonary:     Effort: Pulmonary effort is normal. No respiratory distress.     Breath sounds: Normal breath sounds. No wheezing.  Chest:     Chest wall: No deformity, swelling, tenderness or edema.  Abdominal:     General: Abdomen is flat. Bowel sounds are normal.     Palpations: Abdomen is soft.  Musculoskeletal:        General: Normal range of motion.     Cervical back: Full passive range of motion without pain and normal range of motion. No edema, erythema, rigidity or crepitus. No pain with movement, spinous process tenderness or muscular tenderness. Normal range of motion.  Lymphadenopathy:     Cervical: No cervical adenopathy.  Skin:    General: Skin is warm and dry.  Neurological:     General: No focal deficit present.     Mental Status: She is alert and oriented to person, place, and time. Mental status is at baseline.  Psychiatric:        Mood and Affect: Mood normal.        Behavior: Behavior normal.     UC Treatments / Results  Labs (all labs ordered are listed, but only abnormal results are displayed) Labs Reviewed - No data to display  EKG   Radiology No results found.  Procedures Procedures (including critical care time)  Medications Ordered in UC Medications - No data to display  Initial Impression / Assessment and Plan / UC Course  I have reviewed the triage vital signs and the nursing notes.  Pertinent labs & imaging results that were available during my care of the patient were reviewed by me and considered in my medical decision making (see chart for details).     Patient's symptoms appear viral in  etiology.  Lung sounds are normal and no signs of respiratory compromise on exam.  No x-ray tech in urgent care today.  Outpatient chest x-ray ordered at [place] result.  Benzonatate prescribed to take as needed for cough.  Advised patient to follow-up with PCP for advice on chronic postnasal drip given it is refractory to antihistamines and flonase.  She is complaining of "neck swelling and chest swelling" but physical exam is benign and no signs of cervical lymphadenopathy.  No concern for meningitis.  Patient was advised that she may need to go to the hospital for further evaluation and management given her sensation of neck swelling and chest swelling but patient refused.  Risks of not going to the hospital were discussed with patient.  There are no obvious abnormalities on exam that required further treatment at the hospital and no signs of respiratory distress.  No viral or strep testing necessary given duration of symptoms and appearance of posterior pharynx on exam.  Discussed strict return precautions.  Patient verbalized understanding and was agreeable with plan. Final Clinical Impressions(s) / UC Diagnoses   Final diagnoses:  Persistent cough     Discharge Instructions  A chest x-ray has been ordered for you.  Please go to the [facility] through the emergency department.  Do not check in as a patient.  Ask them where the imaging center is.  You will take your x-ray order there and have it completed.  You have also been prescribed a cough medication to take as needed.  Please follow-up with primary care if symptoms persist.  Go to the hospital if they worsen.    ED Prescriptions     Medication Sig Dispense Auth. Provider   benzonatate (TESSALON) 100 MG capsule Take 1 capsule (100 mg total) by mouth every 8 (eight) hours as needed for cough. 21 capsule Burt, Michele Rockers, [place]      PDMP not reviewed this encounter.   Teodora Medici, [place] 05/03/21  2004

## 2021-05-03 NOTE — Discharge Instructions (Addendum)
A chest x-ray has been ordered for you.  Please go to the [facility] through the emergency department.  Do not check in as a patient.  Ask them where the imaging center is.  You will take your x-ray order there and have it completed.  You have also been prescribed a cough medication to take as needed.  Please follow-up with primary care if symptoms persist.  Go to the hospital if they worsen.

## 2021-05-04 ENCOUNTER — Other Ambulatory Visit: Payer: Self-pay | Admitting: Internal Medicine

## 2021-05-04 NOTE — Telephone Encounter (Signed)
Requested medication (s) are due for refill today: unclear  Requested medication (s) are on the active medication list: yes  Last refill:  02/22/21  Future visit scheduled: no, just had visit recently.  Notes to clinic:  Historical Provider, please assess.  Requested Prescriptions  Pending Prescriptions Disp Refills   lisinopril-hydrochlorothiazide (ZESTORETIC) 10-12.5 MG tablet [Pharmacy Med Name: LISINOPRIL-HCTZ 10-12.5 MG TAB] 90 tablet     Sig: TAKE 1 TABLET BY MOUTH EVERY DAY     Cardiovascular:  ACEI + Diuretic Combos Passed - 05/04/2021  7:29 PM      Passed - Na in normal range and within 180 days    Sodium  Date Value Ref Range Status  03/21/2021 137 135 - 146 mmol/L Final          Passed - K in normal range and within 180 days    Potassium  Date Value Ref Range Status  03/21/2021 4.6 3.5 - 5.3 mmol/L Final          Passed - Cr in normal range and within 180 days    Creat  Date Value Ref Range Status  03/21/2021 0.80 0.50 - 0.97 mg/dL Final   Creatinine, Urine  Date Value Ref Range Status  04/29/2019 127.26 mg/dL Final          Passed - Ca in normal range and within 180 days    Calcium  Date Value Ref Range Status  03/21/2021 9.6 8.6 - 10.2 mg/dL Final   Calcium, Ion  Date Value Ref Range Status  09/16/2012 1.13 1.12 - 1.23 mmol/L Final          Passed - Patient is not pregnant      Passed - Last BP in normal range    BP Readings from Last 1 Encounters:  05/03/21 130/85          Passed - Valid encounter within last 6 months    Recent Outpatient Visits           2 weeks ago Impacted cerumen of right ear   [place], Coralie Keens, NP   1 month ago Encounter for general adult medical examination with abnormal findings   [facility] [place], Coralie Keens, NP   2 months ago Lipoma of neck   [facility] [place], Coralie Keens, NP   4 months ago Primary hypertension   [facility] [place],  [place] W, NP   6 months ago Seasonal allergic rhinitis due to pollen   [facility] [place], Coralie Keens, NP

## 2021-05-10 ENCOUNTER — Ambulatory Visit
Admission: RE | Admit: 2021-05-10 | Discharge: 2021-05-10 | Disposition: A | Discharge status: Home / Self Care | Payer: Medicaid Other | Source: Ambulatory Visit | Attending: Internal Medicine | Admitting: Internal Medicine

## 2021-05-10 DIAGNOSIS — R059 Cough, unspecified: Secondary | ICD-10-CM | POA: Diagnosis not present

## 2021-05-12 NOTE — Progress Notes (Signed)
Normal. No change to plan at this time. Results seen on mychart.

## 2021-05-19 ENCOUNTER — Encounter: Payer: Self-pay | Admitting: Internal Medicine

## 2021-05-24 ENCOUNTER — Other Ambulatory Visit (HOSPITAL_BASED_OUTPATIENT_CLINIC_OR_DEPARTMENT_OTHER): Payer: Self-pay

## 2021-05-24 MED ORDER — AMPHETAMINE-DEXTROAMPHETAMINE 30 MG PO TABS
ORAL_TABLET | ORAL | 0 refills | Status: DC
  Filled 2021-05-24: qty 60, 30d supply, fill #0

## 2021-05-25 ENCOUNTER — Other Ambulatory Visit: Payer: Self-pay

## 2021-05-25 ENCOUNTER — Ambulatory Visit (INDEPENDENT_AMBULATORY_CARE_PROVIDER_SITE_OTHER): Payer: Medicaid Other | Admitting: *Deleted

## 2021-05-25 VITALS — BP 142/90 | HR 102

## 2021-05-25 DIAGNOSIS — Z3201 Encounter for pregnancy test, result positive: Secondary | ICD-10-CM | POA: Diagnosis not present

## 2021-05-25 DIAGNOSIS — Z3200 Encounter for pregnancy test, result unknown: Secondary | ICD-10-CM

## 2021-05-25 DIAGNOSIS — N912 Amenorrhea, unspecified: Secondary | ICD-10-CM

## 2021-05-25 DIAGNOSIS — O0990 Supervision of high risk pregnancy, unspecified, unspecified trimester: Secondary | ICD-10-CM

## 2021-05-25 LAB — POCT URINE PREGNANCY: Preg Test, Ur: POSITIVE — AB

## 2021-05-25 MED ORDER — LABETALOL HCL 200 MG PO TABS: 200.0000 mg | ORAL_TABLET | Freq: Two times a day (BID) | ORAL | 3 refills | Status: DC

## 2021-05-25 MED ORDER — PROMETHAZINE HCL 25 MG PO TABS: 25.0000 mg | ORAL_TABLET | Freq: Four times a day (QID) | ORAL | 1 refills | Status: DC | PRN

## 2021-05-25 MED ORDER — VITAFOL GUMMIES 3.33-0.333-34.8 MG PO CHEW: 1.0000 | CHEWABLE_TABLET | Freq: Every day | ORAL | 5 refills | Status: DC

## 2021-05-25 MED ORDER — BLOOD PRESSURE KIT DEVI: 1.0000 | 0 refills | Status: DC

## 2021-05-25 NOTE — Progress Notes (Signed)
Alexis Ellis presents today for UPT. She has no unusual complaints. BP 142/90. cHTN. Stopped BP meds.  LMP: 04/13/21    OBJECTIVE: Appears well, in no apparent distress.  OB History     Gravida  5   Para  1   Term      Preterm  1   AB  4   Living  1      SAB  3   IAB      Ectopic  1   Multiple  0   Live Births  1          Home UPT Result: positive In-Office UPT result: positive I have reviewed the patient's medical, obstetrical, social, and family histories, and medications.   ASSESSMENT: Positive pregnancy test  PLAN Prenatal care to be completed at: [place] sent for PNV, BP cuff, Labetalol 200 mg BID (cHTN- stopped Lisinopril) per Dr. Rip Harbour

## 2021-05-28 DIAGNOSIS — Z3480 Encounter for supervision of other normal pregnancy, unspecified trimester: Secondary | ICD-10-CM | POA: Diagnosis not present

## 2021-06-06 ENCOUNTER — Encounter: Payer: Self-pay | Admitting: Obstetrics and Gynecology

## 2021-06-06 ENCOUNTER — Encounter: Payer: Self-pay | Admitting: *Deleted

## 2021-06-07 ENCOUNTER — Ambulatory Visit: Payer: Medicaid Other | Admitting: Internal Medicine

## 2021-06-10 ENCOUNTER — Inpatient Hospital Stay (HOSPITAL_COMMUNITY): Payer: Medicaid Other

## 2021-06-10 ENCOUNTER — Inpatient Hospital Stay (HOSPITAL_COMMUNITY)
Admission: AD | Admit: 2021-06-10 | Discharge: 2021-06-10 | Disposition: A | Discharge status: Home / Self Care | Payer: Medicaid Other | Attending: Obstetrics & Gynecology | Admitting: Obstetrics & Gynecology

## 2021-06-10 ENCOUNTER — Other Ambulatory Visit: Payer: Self-pay

## 2021-06-10 ENCOUNTER — Encounter (HOSPITAL_COMMUNITY): Payer: Self-pay | Admitting: Obstetrics & Gynecology

## 2021-06-10 DIAGNOSIS — R519 Headache, unspecified: Secondary | ICD-10-CM | POA: Insufficient documentation

## 2021-06-10 DIAGNOSIS — M7989 Other specified soft tissue disorders: Secondary | ICD-10-CM | POA: Insufficient documentation

## 2021-06-10 DIAGNOSIS — R109 Unspecified abdominal pain: Secondary | ICD-10-CM | POA: Diagnosis not present

## 2021-06-10 DIAGNOSIS — R1032 Left lower quadrant pain: Secondary | ICD-10-CM | POA: Diagnosis not present

## 2021-06-10 DIAGNOSIS — R102 Pelvic and perineal pain: Secondary | ICD-10-CM | POA: Insufficient documentation

## 2021-06-10 DIAGNOSIS — O209 Hemorrhage in early pregnancy, unspecified: Secondary | ICD-10-CM | POA: Insufficient documentation

## 2021-06-10 DIAGNOSIS — R1031 Right lower quadrant pain: Secondary | ICD-10-CM | POA: Diagnosis not present

## 2021-06-10 DIAGNOSIS — O09291 Supervision of pregnancy with other poor reproductive or obstetric history, first trimester: Secondary | ICD-10-CM | POA: Diagnosis not present

## 2021-06-10 DIAGNOSIS — Z3A08 8 weeks gestation of pregnancy: Secondary | ICD-10-CM | POA: Diagnosis not present

## 2021-06-10 DIAGNOSIS — Z6710 Type A blood, Rh positive: Secondary | ICD-10-CM | POA: Insufficient documentation

## 2021-06-10 DIAGNOSIS — O0990 Supervision of high risk pregnancy, unspecified, unspecified trimester: Secondary | ICD-10-CM

## 2021-06-10 DIAGNOSIS — Z79899 Other long term (current) drug therapy: Secondary | ICD-10-CM | POA: Insufficient documentation

## 2021-06-10 DIAGNOSIS — Z3490 Encounter for supervision of normal pregnancy, unspecified, unspecified trimester: Secondary | ICD-10-CM

## 2021-06-10 DIAGNOSIS — O26891 Other specified pregnancy related conditions, first trimester: Secondary | ICD-10-CM | POA: Insufficient documentation

## 2021-06-10 DIAGNOSIS — O208 Other hemorrhage in early pregnancy: Secondary | ICD-10-CM | POA: Diagnosis not present

## 2021-06-10 LAB — URINALYSIS, ROUTINE W REFLEX MICROSCOPIC
Bilirubin Urine: NEGATIVE
Glucose, UA: 50 mg/dL — AB
Hgb urine dipstick: NEGATIVE
Ketones, ur: NEGATIVE mg/dL
Nitrite: NEGATIVE
Protein, ur: NEGATIVE mg/dL
Specific Gravity, Urine: 1.024 (ref 1.005–1.030)
pH: 6 (ref 5.0–8.0)

## 2021-06-10 LAB — WET PREP, GENITAL
Clue Cells Wet Prep HPF POC: NONE SEEN
Sperm: NONE SEEN
Trich, Wet Prep: NONE SEEN
WBC, Wet Prep HPF POC: <10 (ref <10)
Yeast Wet Prep HPF POC: NONE SEEN

## 2021-06-10 LAB — COMPREHENSIVE METABOLIC PANEL
ALT: 27 U/L (ref 0–44)
AST: 18 U/L (ref 15–41)
Albumin: 3.4 g/dL — ABNORMAL LOW (ref 3.5–5.0)
Alkaline Phosphatase: 51 U/L (ref 38–126)
Anion gap: 7 (ref 5–15)
BUN: 8 mg/dL (ref 6–20)
CO2: 25 mmol/L (ref 22–32)
Calcium: 9.3 mg/dL (ref 8.9–10.3)
Chloride: 101 mmol/L (ref 98–111)
Creatinine, Ser: 0.63 mg/dL (ref 0.44–1.00)
GFR, Estimated: >60 mL/min (ref >60)
Glucose, Bld: 107 mg/dL — ABNORMAL HIGH (ref 70–99)
Potassium: 3.6 mmol/L (ref 3.5–5.1)
Sodium: 133 mmol/L — ABNORMAL LOW (ref 135–145)
Total Bilirubin: 1 mg/dL (ref 0.3–1.2)
Total Protein: 6.2 g/dL — ABNORMAL LOW (ref 6.5–8.1)

## 2021-06-10 LAB — CBC
HCT: 40.7 % (ref 36.0–46.0)
Hemoglobin: 13.8 g/dL (ref 12.0–15.0)
MCH: 29.2 pg (ref 26.0–34.0)
MCHC: 33.9 g/dL (ref 30.0–36.0)
MCV: 86.2 fL (ref 80.0–100.0)
Platelets: 266 10*3/uL (ref 150–400)
RBC: 4.72 MIL/uL (ref 3.87–5.11)
RDW: 12.4 % (ref 11.5–15.5)
WBC: 12.4 10*3/uL — ABNORMAL HIGH (ref 4.0–10.5)
nRBC: 0 % (ref 0.0–0.2)

## 2021-06-10 LAB — HCG, QUANTITATIVE, PREGNANCY: hCG, Beta Chain, Quant, S: 41003 m[IU]/mL — ABNORMAL HIGH (ref <5)

## 2021-06-10 MED ORDER — PROMETHAZINE HCL 25 MG PO TABS: 25.0000 mg | ORAL_TABLET | Freq: Four times a day (QID) | ORAL | 1 refills | Status: DC | PRN

## 2021-06-10 MED ORDER — NIFEDIPINE ER OSMOTIC RELEASE 30 MG PO TB24
30.0000 mg | ORAL_TABLET | Freq: Every day | ORAL | 1 refills | Status: DC
Start: 2021-06-10 — End: 2021-07-06

## 2021-06-10 NOTE — MAU Provider Note (Signed)
[name] is a 31 y.o. [phone] at 22w2dwho presents to MAU today with complaint of abdominal pain and vaginal bleeding that started last night.  She denies chest pain, fever, SOB. She endorses nausea but no vomiting; the nausea is mostly at night. She reports some congestion. She denies intercourse in the past 24 hours. She denies any vaginal itching, smell, odor.   She reports that she has had a headache for two weeks since starting her labetalol; she also reports some swelling in her hands and feet that is painful.  History     CSN: [id number] Arrival date and time: 06/10/21 05053  Event Date/Time   First Provider Initiated Contact with Patient 06/10/21 1008      Chief Complaint  Patient presents with   Abdominal Pain   Vaginal Bleeding   Foot Swelling   Abdominal Pain This is a new problem. The current episode started yesterday. The onset quality is sudden. The problem occurs constantly. The problem has been unchanged. The pain is located in the RLQ and LLQ. The pain is at a severity of 4/10. The quality of the pain is cramping. The abdominal pain does not radiate. Associated symptoms include constipation. Pertinent negatives include no diarrhea or dysuria. Nothing aggravates the pain. The pain is relieved by Nothing. She has tried acetaminophen for the symptoms. The treatment provided no relief.  Vaginal Bleeding The patient's primary symptoms include vaginal bleeding. This is a new problem. The current episode started yesterday. The problem occurs intermittently. The problem has been resolved. Associated symptoms include abdominal pain and constipation. Pertinent negatives include no diarrhea or dysuria. The vaginal discharge was bloody. The vaginal bleeding is spotting. She has not been passing clots. She has not been passing tissue.   OB History     Gravida  6   Para  1   Term      Preterm  1   AB  4   Living  1      SAB  3   IAB      Ectopic  1    Multiple  0   Live Births  1           Past Medical History:  Diagnosis Date   ADD (attention deficit disorder)    Chicken pox    Depression    Diet controlled gestational diabetes mellitus (GDM) in third trimester 03/23/2019   Ectopic pregnancy    Elevated blood pressure    Elevated cholesterol    Frequent headaches    Gestational hypertension 04/19/2019   History of kidney stones    Pregnancy induced hypertension     Past Surgical History:  Procedure Laterality Date   LAPAROSCOPY N/A 09/16/2012   Procedure: LAPAROSCOPY OPERATIVE;  Surgeon: JAllena Katz MD;  [facility];  Service: Gynecology;  Laterality: N/A;  Evacuation Hemoperitoneum   TUBAL LIGATION  2014   UNILATERAL SALPINGECTOMY Right 09/16/2012   Procedure: UNILATERAL SALPINGECTOMY;  Surgeon: JAllena Katz MD;  [place] [facility];  Service: Gynecology;  Laterality: Right;   WISDOM TOOTH EXTRACTION      Family History  Problem Relation Age of Onset   Hypertension Mother        Living   Fibromyalgia Mother    Diabetes Father [phone]      Deceased   Heart attack Father    Sudden death Father    Bipolar disorder Maternal Grandmother    Diabetes Maternal Grandfather  Alcohol abuse Paternal Grandfather    Liver disease Paternal Grandfather    Hyperlipidemia Paternal Grandmother    Kidney disease Paternal Grandmother    Liver disease Paternal Grandmother    Heart disease Paternal Grandmother    Healthy Sister    Healthy Son     Social History   Tobacco Use   Smoking status: Former    Packs/day: 0.00    Types: Cigarettes    Quit date: 2020    Years since quitting: 3.0   Smokeless tobacco: Never  Vaping Use   Vaping Use: Never used  Substance Use Topics   Alcohol use: Not Currently    Comment: ocassional   Drug use: No    Allergies: No Known Allergies  Medications Prior to Admission  Medication Sig Dispense Refill Last Dose   amphetamine-dextroamphetamine (ADDERALL) 30 MG tablet 1/2  PO QID 60 tablet 0 06/10/2021   labetalol (NORMODYNE) 200 MG tablet Take 1 tablet (200 mg total) by mouth 2 (two) times daily. 60 tablet 3 06/10/2021   Prenatal Vit-Fe Phos-FA-Omega (VITAFOL GUMMIES) 3.33-0.333-34.8 MG CHEW Chew 1 tablet by mouth daily. 90 tablet 5 06/10/2021   promethazine (PHENERGAN) 25 MG tablet Take 1 tablet (25 mg total) by mouth every 6 (six) hours as needed for nausea or vomiting. 30 tablet 1 06/10/2021   amphetamine-dextroamphetamine (ADDERALL) 20 MG tablet Take 1 tablet by mouth 4 (four) times daily.      Blood Pressure Monitoring (BLOOD PRESSURE KIT) DEVI 1 Device by Does not apply route once a week. 1 each 0    clonazePAM (KLONOPIN) 1 MG tablet Take 1 mg by mouth 2 (two) times daily as needed. (Patient not taking: Reported on 05/25/2021)      FLUoxetine (PROZAC) 20 MG capsule Take 1 capsule (20 mg total) by mouth daily. (Patient not taking: Reported on 05/25/2021) 90 capsule 1    fluticasone (FLONASE) 50 MCG/ACT nasal spray Place 2 sprays into both nostrils daily. (Patient not taking: Reported on 05/25/2021) 16 g 6     Review of Systems  Constitutional: Negative.   HENT: Negative.    Respiratory: Negative.    Cardiovascular: Negative.   Gastrointestinal:  Positive for abdominal pain and constipation. Negative for diarrhea.  Genitourinary:  Positive for vaginal bleeding. Negative for dysuria.  Neurological: Negative.   Hematological: Negative.   Psychiatric/Behavioral: Negative.    Physical Exam   Blood pressure 113/63, pulse 91, temperature 97.7 F (36.5 C), temperature source Oral, resp. rate 17, height 5' 5"  (1.651 m), weight 110.1 kg, last menstrual period 04/13/2021, SpO2 100 %.  Physical Exam Constitutional:      Appearance: She is well-developed.  HENT:     Head: Normocephalic.  Pulmonary:     Effort: Pulmonary effort is normal.  Abdominal:     General: Abdomen is flat.     Palpations: Abdomen is soft.     Tenderness: There is no abdominal tenderness.   Genitourinary:    Vagina: Normal.     Cervix: Normal.  Neurological:     Mental Status: She is alert.  NEFG: no blood in the vagina, no suprapubic or adnexal tenderness, no CMT.   MAU Course  Procedures  MDM -given patient's complaint and history, will do complete ectopic work-up.  -US shows IUP; I have independently reviewed the [place] images, which reveal finding of SIUP.  Blood type A pos Wet prep negative  QUant is 41,000  Patient had no bleeding while in MAU and did not require pain medicines.  She is relieved at the news of [place].  Assessment and Plan   1. Intrauterine pregnancy   2. Abdominal pain   3. Supervision of high risk pregnancy, antepartum   4. [redacted] weeks gestation of pregnancy   -p[atient reported significant headaches exactly two weeks after starting labetelol; will swtich to procardia 30 XL.  -continue current medications, including Adderall as patient has taken this for the past 15 years and she took in her last pregnancy.  -patient reports she is supposed to be on Zoloft but it is not on her medication list; recommended that patient reach out to her psychiatrist  for RX.  -discharge home with pelvic rest precautions and bleeding precautions -GC CT pending  Mervyn Skeeters [facility] 06/10/2021, 10:15 AM

## 2021-06-10 NOTE — MAU Note (Signed)
Pt reports to mau with c/o lower abd cramping since yesterday.  Pt also reports some bright red spotting. Denies recent intercourse. Denies vag dc or odor but states for the past two weeks she has had "a crawling sensation down there"  Pt also reports swelling of hands and feet.  States she recently switch bp medications.

## 2021-06-11 LAB — GC/CHLAMYDIA PROBE AMP (~~LOC~~) NOT AT ARMC
Chlamydia: NEGATIVE
Comment: NEGATIVE
Comment: NORMAL
Neisseria Gonorrhea: NEGATIVE

## 2021-06-15 ENCOUNTER — Encounter: Payer: Self-pay | Admitting: Obstetrics and Gynecology

## 2021-06-27 ENCOUNTER — Other Ambulatory Visit (HOSPITAL_BASED_OUTPATIENT_CLINIC_OR_DEPARTMENT_OTHER): Payer: Self-pay

## 2021-06-28 ENCOUNTER — Other Ambulatory Visit (HOSPITAL_COMMUNITY): Payer: Self-pay

## 2021-06-28 ENCOUNTER — Other Ambulatory Visit (HOSPITAL_BASED_OUTPATIENT_CLINIC_OR_DEPARTMENT_OTHER): Payer: Self-pay

## 2021-06-28 ENCOUNTER — Other Ambulatory Visit: Payer: Self-pay

## 2021-06-28 DIAGNOSIS — O0990 Supervision of high risk pregnancy, unspecified, unspecified trimester: Secondary | ICD-10-CM

## 2021-06-28 MED ORDER — AMPHETAMINE-DEXTROAMPHETAMINE 30 MG PO TABS
ORAL_TABLET | ORAL | 0 refills | Status: DC
  Filled 2021-06-28: qty 60, 30d supply, fill #0

## 2021-06-29 ENCOUNTER — Ambulatory Visit (INDEPENDENT_AMBULATORY_CARE_PROVIDER_SITE_OTHER): Payer: Medicaid Other

## 2021-06-29 DIAGNOSIS — O0990 Supervision of high risk pregnancy, unspecified, unspecified trimester: Secondary | ICD-10-CM

## 2021-06-29 NOTE — Progress Notes (Signed)
New OB Intake  I connected with  [name] on 06/29/21 at  9:00 AM EST by telephone Video Visit and verified that I am speaking with the correct person using two identifiers. Nurse is located at [facility] and pt is located at Home.  I discussed the limitations, risks, security and privacy concerns of performing an evaluation and management service by telephone and the availability of in person appointments. I also discussed with the patient that there may be a patient responsible charge related to this service. The patient expressed understanding and agreed to proceed.  I explained I am completing New OB Intake today. We discussed her EDD of 01/18/2022 that is based on LMP of 04/13/2021. Pt is G6/P1. I reviewed her allergies, medications, Medical/Surgical/OB history, and appropriate screenings. I informed her of [facility] services. Based on history, this is a/an  pregnancy uncomplicated .   Patient Active Problem List   Diagnosis Date Noted   Supervision of high risk pregnancy, antepartum 06/28/2021   Anxiety 12/27/2020   Class 2 obesity due to excess calories with body mass index (BMI) of 38.0 to 38.9 in adult 12/27/2020   HTN (hypertension) 03/27/2020   HLD (hyperlipidemia) 09/29/2015   ADD (attention deficit disorder) 11/07/2013    Concerns addressed today  Delivery Plans:  Plans to deliver at [facility].   MyChart/Babyscripts MyChart access verified. I explained pt will have some visits in office and some virtually. Babyscripts instructions given and order placed. Patient verifies receipt of registration text/e-mail. Account successfully created and app downloaded.  Blood Pressure Cuff  Blood pressure cuff ordered for patient to pick-up from [employer]. Explained after first prenatal appt pt will check weekly and document in 21.  Weight scale: Patient does / does not  have weight scale. Weight scale ordered for patient to pick up from [employer].   Anatomy  US Explained first scheduled [place] will be around 19 weeks. Anatomy US will be scheduled.   Scheduled AFP lab only appointment if CenteringPregnancy pt for same day as anatomy US.   Labs Discussed Johnsie Cancel genetic screening with patient. Would like both Panorama and Horizon drawn at new OB visit.Also if interested in genetic testing, tell patient she will need AFP 15-21 weeks to complete genetic testing .Routine prenatal labs needed.  Covid Vaccine Patient has not covid vaccine.   Is patient a CenteringPregnancy candidate? Not a candidate   Is patient a Mom+Baby Combined Care candidate? Declined     Informed patient of [facility] website  and placed link in her AVS.   Social Determinants of Health Food Insecurity: Patient denies food insecurity. WIC Referral: Patient is interested in referral to [facility].  Transportation: Patient denies transportation needs. Childcare: Discussed no children allowed at ultrasound appointments. Offered childcare services; patient declines childcare services at this time.  Send link to Pregnancy Navigators   Placed OB Box on problem list and updated  First visit review I reviewed new OB appt with pt. I explained she will have a pelvic exam, ob bloodwork with genetic screening, and PAP smear. Explained pt will be seen by Dr. Rip Harbour at first visit; encounter routed to appropriate provider. Explained that patient will be seen by pregnancy navigator following visit with provider. [facility] information placed in AVS.   Tamela Oddi, RMA 06/29/2021  8:58 AM

## 2021-06-29 NOTE — Progress Notes (Signed)
Agree with nurses's documentation of this patient's clinic encounter.  Elin Fenley L, MD  

## 2021-07-06 ENCOUNTER — Institutional Professional Consult (permissible substitution): Payer: Medicaid Other | Admitting: Licensed Clinical Social Worker

## 2021-07-06 ENCOUNTER — Other Ambulatory Visit: Payer: Self-pay

## 2021-07-06 ENCOUNTER — Encounter: Payer: Self-pay | Admitting: Obstetrics and Gynecology

## 2021-07-06 ENCOUNTER — Ambulatory Visit (INDEPENDENT_AMBULATORY_CARE_PROVIDER_SITE_OTHER): Payer: Medicaid Other

## 2021-07-06 ENCOUNTER — Ambulatory Visit (INDEPENDENT_AMBULATORY_CARE_PROVIDER_SITE_OTHER): Payer: Medicaid Other | Admitting: Obstetrics and Gynecology

## 2021-07-06 VITALS — BP 126/87 | HR 93 | Wt 243.5 lb

## 2021-07-06 DIAGNOSIS — Z3A12 12 weeks gestation of pregnancy: Secondary | ICD-10-CM

## 2021-07-06 DIAGNOSIS — O368390 Maternal care for abnormalities of the fetal heart rate or rhythm, unspecified trimester, not applicable or unspecified: Secondary | ICD-10-CM | POA: Diagnosis not present

## 2021-07-06 DIAGNOSIS — O0991 Supervision of high risk pregnancy, unspecified, first trimester: Secondary | ICD-10-CM | POA: Diagnosis not present

## 2021-07-06 DIAGNOSIS — O10911 Unspecified pre-existing hypertension complicating pregnancy, first trimester: Secondary | ICD-10-CM | POA: Diagnosis not present

## 2021-07-06 DIAGNOSIS — O10919 Unspecified pre-existing hypertension complicating pregnancy, unspecified trimester: Secondary | ICD-10-CM | POA: Diagnosis not present

## 2021-07-06 DIAGNOSIS — O09299 Supervision of pregnancy with other poor reproductive or obstetric history, unspecified trimester: Secondary | ICD-10-CM | POA: Insufficient documentation

## 2021-07-06 DIAGNOSIS — O09291 Supervision of pregnancy with other poor reproductive or obstetric history, first trimester: Secondary | ICD-10-CM

## 2021-07-06 DIAGNOSIS — O09891 Supervision of other high risk pregnancies, first trimester: Secondary | ICD-10-CM

## 2021-07-06 DIAGNOSIS — O09899 Supervision of other high risk pregnancies, unspecified trimester: Secondary | ICD-10-CM | POA: Insufficient documentation

## 2021-07-06 DIAGNOSIS — Z8632 Personal history of gestational diabetes: Secondary | ICD-10-CM

## 2021-07-06 DIAGNOSIS — O0990 Supervision of high risk pregnancy, unspecified, unspecified trimester: Secondary | ICD-10-CM

## 2021-07-06 HISTORY — DX: Supervision of pregnancy with other poor reproductive or obstetric history, unspecified trimester: O09.299

## 2021-07-06 MED ORDER — ASPIRIN EC 81 MG PO TBEC: 81.0000 mg | DELAYED_RELEASE_TABLET | Freq: Every day | ORAL | 2 refills | Status: DC

## 2021-07-06 NOTE — Progress Notes (Signed)
Subjective:  Alexis Ellis is a 31 y.o. [phone] at 51w0dbeing seen today for her first OB visit. EDD by LMP and confirmed by first trimester U/S.  H/O CHTN. [facility]  She is currently monitored for the following issues for this high-risk pregnancy and has ADD (attention deficit disorder); HLD (hyperlipidemia); HTN (hypertension); Anxiety; Class 2 obesity due to excess calories with body mass index (BMI) of 38.0 to 38.9 in adult; Supervision of high risk pregnancy, antepartum; Chronic hypertension affecting pregnancy; History of pre-eclampsia in prior pregnancy, currently pregnant; History of preterm delivery, currently pregnant; and History of gestational diabetes in prior pregnancy, currently pregnant on their problem list.  Patient reports no complaints.  Contractions: Not present. Vag. Bleeding: None.  Movement: Present. Denies leaking of fluid.   The following portions of the patient's history were reviewed and updated as appropriate: allergies, current medications, past family history, past medical history, past social history, past surgical history and problem list. Problem list updated.  Objective:   Vitals:   07/06/21 1018  BP: 126/87  Pulse: 93  Weight: 110.5 kg    Fetal Status: Fetal Heart Rate (bpm): 163   Movement: Present     General:  Alert, oriented and cooperative. Patient is in no acute distress.  Skin: Skin is warm and dry. No rash noted.   Cardiovascular: Normal heart rate noted  Respiratory: Normal respiratory effort, no problems with respiration noted  Abdomen: Soft, gravid, appropriate for gestational age. Pain/Pressure: Absent     Pelvic:  Cervical exam performed        Extremities: Normal range of motion.  Edema: Trace  Mental Status: Normal mood and affect. Normal behavior. Normal judgment and thought content.   Urinalysis:      Assessment and Plan:  Pregnancy: [id number] 122w0d1. Supervision of high risk pregnancy,  antepartum Prenatal labs and care reviewed with pt Genetic testing discussed - TSH - Hemoglobin A1c - CBC/D/Plt+RPR+Rh+ABO+RubIgG... - Culture, OB Urine - Genetic Screening - US[place]FM OB DETAIL +14 WK; Future  2. Chronic hypertension affecting pregnancy BP stable on current meds Start BASA CHTN and pregnancy reviewed with pt Serial growth scans and antenatal testing as per protocol. - Comp Met (CMET) - Protein / creatinine ratio, urine - aspirin EC 81 MG tablet; Take 1 tablet (81 mg total) by mouth daily. Take after 12 weeks for prevention of preeclampsia later in pregnancy  Dispense: 300 tablet; Refill: 2  3. History of pre-eclampsia in prior pregnancy, currently pregnant See above  4. History of preterm delivery, currently pregnant [facility]  5. History of gestational diabetes in prior pregnancy, currently pregnant A1c today  6. Unable to hear fetal heart tones as reason for ultrasound scan  - [employer]; Future  Preterm labor symptoms and general obstetric precautions including but not limited to vaginal bleeding, contractions, leaking of fluid and fetal movement were reviewed in detail with the patient. Please refer to After Visit Summary for other counseling recommendations.  Return in about 4 weeks (around 08/03/2021) for OB visit, face to face, MD only.   ErChancy MilroyMD

## 2021-07-06 NOTE — Progress Notes (Signed)
Pt presents for NOB visit. Pt is current on pap smear and pt had Horizon testing done with last baby in 2020. Pt is taking labetalol at this time. Pt has rx for procardia but states she has not picked the medication up at this time and decided not to fill it.   GC/Chlamydia completed on 06/10/2021 - negative, will not need today.

## 2021-07-06 NOTE — Patient Instructions (Signed)

## 2021-07-07 LAB — CBC/D/PLT+RPR+RH+ABO+RUBIGG...
Antibody Screen: NEGATIVE
Basophils Absolute: 0.1 10*3/uL (ref 0.0–0.2)
Basos: 1 %
EOS (ABSOLUTE): 0.1 10*3/uL (ref 0.0–0.4)
Eos: 1 %
HCV Ab: NONREACTIVE
HIV Screen 4th Generation wRfx: NONREACTIVE
Hematocrit: 40.8 % (ref 34.0–46.6)
Hemoglobin: 13.8 g/dL (ref 11.1–15.9)
Hepatitis B Surface Ag: NEGATIVE
Immature Grans (Abs): 0.1 10*3/uL (ref 0.0–0.1)
Immature Granulocytes: 1 %
Lymphocytes Absolute: 2.4 10*3/uL (ref 0.7–3.1)
Lymphs: 24 %
MCH: 29.1 pg (ref 26.6–33.0)
MCHC: 33.8 g/dL (ref 31.5–35.7)
MCV: 86 fL (ref 79–97)
Monocytes Absolute: 0.5 10*3/uL (ref 0.1–0.9)
Monocytes: 5 %
Neutrophils Absolute: 6.6 10*3/uL (ref 1.4–7.0)
Neutrophils: 68 %
Platelets: 268 10*3/uL (ref 150–450)
RBC: 4.75 x10E6/uL (ref 3.77–5.28)
RDW: 12.3 % (ref 11.7–15.4)
RPR Ser Ql: NONREACTIVE
Rh Factor: POSITIVE
Rubella Antibodies, IGG: 14.9 index (ref >0.99)
WBC: 9.8 10*3/uL (ref 3.4–10.8)

## 2021-07-07 LAB — COMPREHENSIVE METABOLIC PANEL
ALT: 15 IU/L (ref 0–32)
AST: 14 IU/L (ref 0–40)
Albumin/Globulin Ratio: 2.1 (ref 1.2–2.2)
Albumin: 4.2 g/dL (ref 3.8–4.8)
Alkaline Phosphatase: 64 IU/L (ref 44–121)
BUN/Creatinine Ratio: 15 (ref 9–23)
BUN: 9 mg/dL (ref 6–20)
Bilirubin Total: 0.5 mg/dL (ref 0.0–1.2)
CO2: 21 mmol/L (ref 20–29)
Calcium: 9.4 mg/dL (ref 8.7–10.2)
Chloride: 100 mmol/L (ref 96–106)
Creatinine, Ser: 0.62 mg/dL (ref 0.57–1.00)
Globulin, Total: 2 g/dL (ref 1.5–4.5)
Glucose: 78 mg/dL (ref 70–99)
Potassium: 4.2 mmol/L (ref 3.5–5.2)
Sodium: 134 mmol/L (ref 134–144)
Total Protein: 6.2 g/dL (ref 6.0–8.5)
eGFR: 121 mL/min/{1.73_m2} (ref >59)

## 2021-07-07 LAB — HEMOGLOBIN A1C
Est. average glucose Bld gHb Est-mCnc: 100 mg/dL
Hgb A1c MFr Bld: 5.1 % (ref 4.8–5.6)

## 2021-07-07 LAB — TSH: TSH: 0.934 u[IU]/mL (ref 0.450–4.500)

## 2021-07-07 LAB — HCV INTERPRETATION

## 2021-07-08 LAB — PROTEIN / CREATININE RATIO, URINE
Creatinine, Urine: 135.1 mg/dL
Protein, Ur: 10.2 mg/dL
Protein/Creat Ratio: 75 mg/g creat (ref 0–200)

## 2021-07-08 LAB — CULTURE, OB URINE

## 2021-07-08 LAB — URINE CULTURE, OB REFLEX

## 2021-07-13 ENCOUNTER — Telehealth: Payer: Self-pay

## 2021-07-13 NOTE — Telephone Encounter (Signed)
Received Natera/Panorama results. Results state insufficient DNA. ?Pt called and advised, redraw appt made.   ?

## 2021-07-15 ENCOUNTER — Encounter: Payer: Self-pay | Admitting: Obstetrics and Gynecology

## 2021-07-16 ENCOUNTER — Other Ambulatory Visit: Payer: Self-pay

## 2021-07-16 ENCOUNTER — Other Ambulatory Visit: Payer: Self-pay | Admitting: *Deleted

## 2021-07-16 ENCOUNTER — Other Ambulatory Visit: Payer: Medicaid Other

## 2021-07-16 DIAGNOSIS — O0990 Supervision of high risk pregnancy, unspecified, unspecified trimester: Secondary | ICD-10-CM

## 2021-07-23 ENCOUNTER — Encounter: Payer: Self-pay | Admitting: Obstetrics and Gynecology

## 2021-07-24 ENCOUNTER — Encounter: Payer: Self-pay | Admitting: Obstetrics and Gynecology

## 2021-07-26 ENCOUNTER — Encounter: Payer: Self-pay | Admitting: Obstetrics and Gynecology

## 2021-07-26 ENCOUNTER — Other Ambulatory Visit: Payer: Self-pay | Admitting: *Deleted

## 2021-07-26 NOTE — Progress Notes (Signed)
Pt has had 2 Panorama test with insufficient fetal cells. ?Per Johnsie Cancel, would refer to MFM for further eval.  ?MFM referral was placed today per Dr Elgie Congo approval. ? ?Pt is aware. ?

## 2021-08-01 ENCOUNTER — Ambulatory Visit: Payer: Medicaid Other | Attending: Obstetrics and Gynecology

## 2021-08-01 ENCOUNTER — Other Ambulatory Visit: Payer: Self-pay

## 2021-08-01 ENCOUNTER — Ambulatory Visit: Payer: Self-pay | Admitting: Genetics

## 2021-08-01 DIAGNOSIS — O285 Abnormal chromosomal and genetic finding on antenatal screening of mother: Secondary | ICD-10-CM | POA: Diagnosis not present

## 2021-08-01 NOTE — Progress Notes (Addendum)
?Name: Alexis Ellis Indication: Discuss risk for aneuploidy  ?DOB: [birth year] Age: 31 y.o.   ?EDC: 01/18/2022 LMP: 04/13/2021 Referring Provider:  ?Griffin Basil, MD  ?EGA: 76w5dGenetic Counselor: ?AStaci Righter MS, CGC  ?OB Hx: [id number] of Appointment: 08/01/2021  ?Accompanied by: Her reproductive partner, [name] Face to Face Time: 677Minutes  ? ?Previous Testing Completed: ?Alexis Ellis previously completed carrier screening. She screened to not be a carrier for 14 conditions included on the panel. A negative result on carrier screening reduces the likelihood of being a carrier, however, does not entirely rule out the possibility. A copy of Alexis Ellis's carrier screening result is available in Epic under the Media tab. ?  ? ?Family History: A pedigree was created and scanned into Epic under the Media tab. ?Alexis Ellis reports her father passed away unexpectedly at the age of 445from a heart attack.  ?Maternal ethnicity reported as Caucasian/English and paternal ethnicity reported as CDoctor, hospital ?Denies Ashkenazi Jewish ancestry. ?Family history not remarkable for consanguinity, individuals with birth defects, intellectual disability, autism spectrum disorder, still births, or unexplained neonatal death.  ?   ?Genetic Counseling:  ? ?Risk for Aneuploidy. [name] completed NIPS twice in this pregnancy and did not obtain results either time due to a low fetal (placental) fraction (2.3% and 1.9% respectively). A low fetal fraction could be attributed to normal variation, gestational age, maternal weight, and certain medications; also, this finding may be associated with some chromosome abnormalities. Genetic counseling reviewed the technology of NIPS and the two insufficient fetal (placental) DNA results with Alexis Ellis in detail. We discussed the option of repeating NIPS a third time given that Alexis Ellis is farther along in pregnancy and there might be enough fetal fraction to complete the screening. We  also discussed amniocentesis for prenatal diagnosis in the form of a fetal karyotype and fetal microarray. Possible procedural difficulties and complications that can arise include maternal infection, cramping, bleeding, fluid leakage, and/or pregnancy loss. The risk for pregnancy loss with an amniocentesis is 1/500. We also discussed the option of no further screening/testing and monitoring with only ultrasounds. Alexis Ellis appeared to understand the information discussed in the genetic counseling session. After careful consideration, Alexis Ellis opted for no further screening/testing and monitoring with only ultrasounds.  ? ?Birth Defects. All babies have approximately a 3-5% risk for a birth defect and a majority of these defects cannot be detected through the screening or diagnostic testing listed below. Ultrasound may detect some birth defects, but it may not detect all birth defects. About half of pregnancies with Down syndrome do not show any soft markers on ultrasound. A normal ultrasound does not guarantee a healthy pregnancy. ? 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 She has a living, healthy son. She has had one ectopic pregnancy and 4 spontaneous first trimester losses. Recurrent pregnancy loss is defined as three or more clinically recognized consecutive or non-consecutive pregnancy losses occurring prior to fetal viability (<[redacted] weeks gestation). Genetic counseling offered Alexis Ellis the option of discussing her history of recurrent pregnancy loss including discussing genetic testing options to learn more information about the potential cause of the pregnancy losses. Alexis Ellis politely declined to discuss this topic at this time.  ?  ? ?Testing/Screening Options:  ? ?Amniocentesis. This procedure is available for prenatal diagnosis. Possible procedural difficulties and complications that can arise include maternal infection, cramping, bleeding, fluid leakage, and/or pregnancy loss. The  risk for pregnancy loss with an amniocentesis is 1/500. Per the [employer]  Obstetricians and Gynecologists (ACOG) Practice Bulletin 7, all pregnant women should be offered prenatal assessment for aneuploidy by diagnostic testing regardless of maternal age or other risk factors. If indicated, genetic testing that could be ordered on an amniocentesis sample includes a fetal karyotype, fetal microarray, and testing for specific syndromes. ? ?Non-invasive prenatal screening (NIPS). This can screen the pregnancy for aneuploidy involving chromosomes 13, 18, 21, X, and Y. If NIPS results indicate high-risk for a chromosomal aneuploidy, prenatal diagnosis via CVS or amniocentesis would be recommended. A low-risk NIPS result does not ensure an unaffected pregnancy. NIPS does not screen for neural tube defects or other genetic conditions. Per the ACOG Practice Bulletin 54 all pregnant women regardless of age, baseline risk, and number of fetuses should be offered all screening options including NIPS which was previously only offered for singleton high risk pregnancies. ?  ?  ?Patient Plan: ? ?Proceed with: Routine prenatal care ?Informed consent was obtained. All questions were answered. ? ?Declined: Amniocentesis, Repeat NIPS  ? ?Thank you for sharing in the care of Alexis Ellis with [place].  ?Please do not hesitate to contact us if you have any questions. ? ?Staci Righter, MS, CGC ?Certified Genetic Counselor ?

## 2021-08-03 ENCOUNTER — Other Ambulatory Visit: Payer: Self-pay

## 2021-08-03 ENCOUNTER — Ambulatory Visit (INDEPENDENT_AMBULATORY_CARE_PROVIDER_SITE_OTHER): Payer: Medicaid Other | Admitting: Obstetrics and Gynecology

## 2021-08-03 ENCOUNTER — Encounter: Payer: Self-pay | Admitting: Obstetrics and Gynecology

## 2021-08-03 VITALS — BP 123/81 | HR 99 | Wt 251.0 lb

## 2021-08-03 DIAGNOSIS — Z8632 Personal history of gestational diabetes: Secondary | ICD-10-CM

## 2021-08-03 DIAGNOSIS — O09299 Supervision of pregnancy with other poor reproductive or obstetric history, unspecified trimester: Secondary | ICD-10-CM

## 2021-08-03 DIAGNOSIS — O10919 Unspecified pre-existing hypertension complicating pregnancy, unspecified trimester: Secondary | ICD-10-CM

## 2021-08-03 DIAGNOSIS — O0990 Supervision of high risk pregnancy, unspecified, unspecified trimester: Secondary | ICD-10-CM

## 2021-08-03 DIAGNOSIS — O09899 Supervision of other high risk pregnancies, unspecified trimester: Secondary | ICD-10-CM

## 2021-08-03 NOTE — Progress Notes (Signed)
Pt states she may have UTI.  ?

## 2021-08-03 NOTE — Patient Instructions (Signed)

## 2021-08-03 NOTE — Progress Notes (Signed)
Subjective:  ?Alexis Ellis is a 31 y.o. [phone] at 15w0dbeing seen today for ongoing prenatal care.  She is currently monitored for the following issues for this high-risk pregnancy and has ADD (attention deficit disorder); HLD (hyperlipidemia); HTN (hypertension); Anxiety; Class 2 obesity due to excess calories with body mass index (BMI) of 38.0 to 38.9 in adult; Supervision of high risk pregnancy, antepartum; Chronic hypertension affecting pregnancy; History of pre-eclampsia in prior pregnancy, currently pregnant; History of preterm delivery, currently pregnant; and History of gestational diabetes in prior pregnancy, currently pregnant on their problem list. ? ?Patient reports no complaints.  Contractions: Not present. Vag. Bleeding: None.  Movement: Present. Denies leaking of fluid.  ? ?The following portions of the patient's history were reviewed and updated as appropriate: allergies, current medications, past family history, past medical history, past social history, past surgical history and problem list. Problem list updated. ? ?Objective:  ? ?Vitals:  ? 08/03/21 1100  ?BP: 123/81  ?Pulse: 99  ?Weight: 251 lb (113.9 kg)  ? ? ?Fetal Status: Fetal Heart Rate (bpm): 155   Movement: Present    ? ?General:  Alert, oriented and cooperative. Patient is in no acute distress.  ?Skin: Skin is warm and dry. No rash noted.   ?Cardiovascular: Normal heart rate noted  ?Respiratory: Normal respiratory effort, no problems with respiration noted  ?Abdomen: Soft, gravid, appropriate for gestational age. Pain/Pressure: Absent     ?Pelvic:  Cervical exam deferred        ?Extremities: Normal range of motion.     ?Mental Status: Normal mood and affect. Normal behavior. Normal judgment and thought content.  ? ?Urinalysis:     ? ?Assessment and Plan:  ?Pregnancy: [id number] 159w0d ?1. Supervision of high risk pregnancy, antepartum ?Stable ?Declined AFP ?Anatomy scan next month ? ?2. Chronic hypertension affecting pregnancy ?BP  stable on current regiment ? ?3. History of pre-eclampsia in prior pregnancy, currently pregnant ?No S/Sx at present ? ?4. History of preterm delivery, currently pregnant ?[facility] ?5. History of gestational diabetes in prior pregnancy, currently pregnant ?Normal a1c ? ?Preterm labor symptoms and general obstetric precautions including but not limited to vaginal bleeding, contractions, leaking of fluid and fetal movement were reviewed in detail with the patient. ?Please refer to After Visit Summary for other counseling recommendations.  ?No follow-ups on file. ? ? ?ErChancy MilroyMD ?

## 2021-08-24 ENCOUNTER — Encounter: Payer: Medicaid Other | Admitting: Obstetrics and Gynecology

## 2021-08-24 ENCOUNTER — Encounter: Payer: Self-pay | Admitting: *Deleted

## 2021-08-24 ENCOUNTER — Other Ambulatory Visit: Payer: Self-pay | Admitting: *Deleted

## 2021-08-24 ENCOUNTER — Ambulatory Visit: Payer: Medicaid Other | Attending: Obstetrics and Gynecology

## 2021-08-24 ENCOUNTER — Ambulatory Visit: Payer: Medicaid Other | Admitting: *Deleted

## 2021-08-24 VITALS — BP 135/75 | HR 92

## 2021-08-24 DIAGNOSIS — O09292 Supervision of pregnancy with other poor reproductive or obstetric history, second trimester: Secondary | ICD-10-CM

## 2021-08-24 DIAGNOSIS — O0990 Supervision of high risk pregnancy, unspecified, unspecified trimester: Secondary | ICD-10-CM | POA: Insufficient documentation

## 2021-08-24 DIAGNOSIS — O10912 Unspecified pre-existing hypertension complicating pregnancy, second trimester: Secondary | ICD-10-CM

## 2021-08-31 ENCOUNTER — Encounter: Payer: Self-pay | Admitting: Obstetrics & Gynecology

## 2021-08-31 ENCOUNTER — Ambulatory Visit (INDEPENDENT_AMBULATORY_CARE_PROVIDER_SITE_OTHER): Payer: Medicaid Other | Admitting: Obstetrics & Gynecology

## 2021-08-31 VITALS — BP 131/84 | HR 98 | Wt 251.0 lb

## 2021-08-31 DIAGNOSIS — Z3A20 20 weeks gestation of pregnancy: Secondary | ICD-10-CM

## 2021-08-31 DIAGNOSIS — O0990 Supervision of high risk pregnancy, unspecified, unspecified trimester: Secondary | ICD-10-CM

## 2021-08-31 DIAGNOSIS — O2242 Hemorrhoids in pregnancy, second trimester: Secondary | ICD-10-CM

## 2021-08-31 DIAGNOSIS — O09299 Supervision of pregnancy with other poor reproductive or obstetric history, unspecified trimester: Secondary | ICD-10-CM

## 2021-08-31 DIAGNOSIS — O10919 Unspecified pre-existing hypertension complicating pregnancy, unspecified trimester: Secondary | ICD-10-CM

## 2021-08-31 MED ORDER — LABETALOL HCL 200 MG PO TABS: 400.0000 mg | ORAL_TABLET | Freq: Two times a day (BID) | ORAL | 3 refills | Status: DC

## 2021-08-31 MED ORDER — HYDROCORTISONE (PERIANAL) 2.5 % EX CREA: TOPICAL_CREAM | Freq: Two times a day (BID) | CUTANEOUS | 2 refills | Status: DC

## 2021-08-31 NOTE — Progress Notes (Signed)
? ?PRENATAL VISIT NOTE ? ?Subjective:  ?Alexis Ellis is a 31 y.o. [phone] at 66w0dbeing seen today for ongoing prenatal care.  She is currently monitored for the following issues for this high-risk pregnancy and has ADD (attention deficit disorder); HTN (hypertension); Anxiety; Class 2 obesity due to excess calories with body mass index (BMI) of 38.0 to 38.9 in adult; Supervision of high risk pregnancy, antepartum; Chronic hypertension affecting pregnancy; History of pre-eclampsia in prior pregnancy, currently pregnant; History of preterm delivery, currently pregnant; and History of gestational diabetes in prior pregnancy, currently pregnant on their problem list. ? ?Patient reports  weird tissue around vaginal opening that she noticed.  Also painful external hemorrhoids .  Also reports elevated BP over the last weeks at home. Patient denies any headaches, visual symptoms, RUQ/epigastric pain or other concerning symptoms. Contractions: Not present. Vag. Bleeding: None.  Movement: Present. Denies leaking of fluid.  ? ?The following portions of the patient's history were reviewed and updated as appropriate: allergies, current medications, past family history, past medical history, past social history, past surgical history and problem list.  ? ?Objective:  ? ?Vitals:  ? 08/31/21 1100  ?BP: 131/84  ?Pulse: 98  ?Weight: 251 lb (113.9 kg)  ? ? ?Fetal Status: Fetal Heart Rate (bpm): 148   Movement: Present    ? ?General:  Alert, oriented and cooperative. Patient is in no acute distress.  ?Skin: Skin is warm and dry. No rash noted.   ?Cardiovascular: Normal heart rate noted  ?Respiratory: Normal respiratory effort, no problems with respiration noted  ?Abdomen: Soft, gravid, appropriate for gestational age.  Pain/Pressure: Absent     ?Pelvic: Exam performed in the presence of a chaperone. The areas she was referring were her hymenal remnants, she was reassured about this. Small external, non-erythematous hemorrhoid  seen          ?Extremities: Normal range of motion.  Edema: Trace  ?Mental Status: Normal mood and affect. Normal behavior. Normal judgment and thought content.  ? ?[place]FM OB DETAIL +14 WK ? ?Result Date: 08/24/2021 ?----------------------------------------------------------------------  OBSTETRICS REPORT                       (Signed Final 08/24/2021 01:00 pm) ---------------------------------------------------------------------- Patient Info  ID #:       [id number]                         D.O.B.:  006/14/1992(30 yrs)  Name:       Alexis Ellis               Visit Date: 08/24/2021 11:27 am ---------------------------------------------------------------------- Performed By  Attending:        RTama HighMD        Ref. Address:     [address]  [employer], [place]  Performed By:     Hubert Azure          [facility]                                                             [place] [facility]                                                             [facility]  Referred By:      Chancy Milroy                    MD ---------------------------------------------------------------------- Orders  #  Description                           Code        Ordered By  1  [place] MFM OB DETAIL +14 [place]               76811.01    MICHAEL ERVIN ----------------------------------------------------------------------  #  Order #                     Accession #                Episode #  1  [id number]                   [id number]                 [id number] ---------------------------------------------------------------------- Indications  Hypertension - Chronic/Pre-existing            O10.019  (labetalol)  [redacted] weeks gestation of pregnancy                Z3A.19   Encounter for antenatal screening for          Z36.3  malformations  Low fetal fraction X2  Obesity complicating pregnancy                 O99.210 E66.9  Poor obstetric history: Previous               O09.299  preeclampsia / eclampsia/gestational HTN  Poor obstetric history: Previous preterm       O09.219  delivery, antepartum (33w)  Poor obstetric history: Previous gestational  O09.299  diabetes ---------------------------------------------------------------------- Vital Signs  Weight (lb): 230                               Height:        5'5"  BMI:         38.27 ---------------------------------------------------------------------- Fetal Evaluation  Num Of Fetuses:         1  Fetal Heart Rate(bpm):  148  Cardiac Activity:       Observed  Presentation:           Cephalic  Placenta:               Posterior  P. Cord Insertion:      Visualized, central  Amniotic Fluid  AFI FV:      Within normal limits ---------------------------------------------------------------------- Biometry  BPD:      45.7  mm     G. Age:  19w 6d         82  %    CI:        75.56   %    70 - 86                                                          FL/HC:      16.2   %    16.1 - 18.3  HC:      166.7  mm     G. Age:  19w 2d         60  %    HC/AC:      1.13        1.09 - 1.39  AC:      147.6  mm     G. Age:  20w 0d         79  %    FL/BPD:     59.1   %  FL:         27  mm     G. Age:  18w 2d         18  %    FL/AC:      18.3   %    20 - 24  HUM:      28.1  mm     G. Age:  19w 0d         51  %  CER:      19.9  mm     G. Age:  19w 2d         47  %  NFT:       4.2  mm  LV:          6  mm  CM:        5.1  mm  Est. FW:     281  gm    0 lb 10 oz      60  % ---------------------------------------------------------------------- OB History  Gravidity:    6         Prem:   1         SAB:   3  Ectopic:      1 ---------------------------------------------------------------------- Gestational Age  LMP:           19w 0d  Date:  04/13/21                   EDD:   01/18/22  U/S Today:     19w 3d                                        EDD:   01/15/22  Best:          19w 0d     Det. By:  LMP  (04/13/21)          EDD:   01/18/22 ---------------------------------------------------------------------- Anatomy  Cranium:               Appears normal         LVOT:                   Appears normal  Cavum:                 Not well visualized    Aortic Arch:            Appears normal  Ventricles:            Appears normal         Ductal Arch:            Not well visualized  Choroid Plexus:        Appears normal         Diaphragm:              Appears normal  Cerebellum:            Appears normal         Stomach:                Appears normal, left                                                                        sided  Posterior Fossa:       Appears normal         Abdomen:                Appears normal  Nuchal Fold:           Appears normal         Abdominal Wall:         Appears nml (cord                                                                        insert, abd wall)  Face:                  Appears normal         Cord Vessels:           Appears normal (3                         (  orbits and profile)                           vessel cord)  Lips:                  Appears normal         Kidneys:                Appear normal  Palate:                Appears normal         Bladder:                Appears normal  Thoracic:              Appears normal         Spine:                  Appears normal  Heart:                 Appears normal         Upper Extremities:      Appears normal                         (4CH, axis, and                         situs)  RVOT:                  Appears normal         Lower Extremities:      Appears normal  Other:  Fetus appears to be a female. 5th digit visualized. Nasal bone          visualized. Feet visualized. Technically difficult due to maternal          habitus and fetal position.  ---------------------------------------------------------------------- Cervix Uterus Adnexa  Cervix  Length:           5.17  cm.  Normal appearance by transabdominal scan.  Uterus  No abnormality visualized.  Right Ovary  Not visualized.  Left Ovary  Not visualized.  Ad

## 2021-09-19 ENCOUNTER — Other Ambulatory Visit: Payer: Self-pay

## 2021-09-19 ENCOUNTER — Encounter: Payer: Self-pay | Admitting: Obstetrics and Gynecology

## 2021-09-19 DIAGNOSIS — O0990 Supervision of high risk pregnancy, unspecified, unspecified trimester: Secondary | ICD-10-CM

## 2021-09-19 MED ORDER — MEDICAL COMPRESSION SOCKS MISC: 1.0000 | 0 refills | Status: DC | PRN

## 2021-09-20 ENCOUNTER — Other Ambulatory Visit: Payer: Self-pay

## 2021-09-20 DIAGNOSIS — O0990 Supervision of high risk pregnancy, unspecified, unspecified trimester: Secondary | ICD-10-CM

## 2021-09-20 MED ORDER — MEDICAL COMPRESSION SOCKS MISC: 1.0000 | 0 refills | Status: DC | PRN

## 2021-09-20 NOTE — Progress Notes (Signed)
xc

## 2021-09-28 ENCOUNTER — Encounter: Payer: Medicaid Other | Admitting: Obstetrics and Gynecology

## 2021-09-28 ENCOUNTER — Ambulatory Visit: Payer: Medicaid Other | Attending: Obstetrics and Gynecology

## 2021-09-28 ENCOUNTER — Ambulatory Visit: Payer: Medicaid Other | Admitting: *Deleted

## 2021-09-28 VITALS — BP 133/69 | HR 96

## 2021-09-28 DIAGNOSIS — O10012 Pre-existing essential hypertension complicating pregnancy, second trimester: Secondary | ICD-10-CM

## 2021-09-28 DIAGNOSIS — E669 Obesity, unspecified: Secondary | ICD-10-CM

## 2021-09-28 DIAGNOSIS — O10912 Unspecified pre-existing hypertension complicating pregnancy, second trimester: Secondary | ICD-10-CM | POA: Diagnosis not present

## 2021-09-28 DIAGNOSIS — O99212 Obesity complicating pregnancy, second trimester: Secondary | ICD-10-CM

## 2021-09-28 DIAGNOSIS — O09292 Supervision of pregnancy with other poor reproductive or obstetric history, second trimester: Secondary | ICD-10-CM

## 2021-09-28 DIAGNOSIS — O09212 Supervision of pregnancy with history of pre-term labor, second trimester: Secondary | ICD-10-CM

## 2021-09-28 DIAGNOSIS — O0990 Supervision of high risk pregnancy, unspecified, unspecified trimester: Secondary | ICD-10-CM | POA: Diagnosis not present

## 2021-09-28 DIAGNOSIS — Z3A24 24 weeks gestation of pregnancy: Secondary | ICD-10-CM | POA: Diagnosis not present

## 2021-10-01 ENCOUNTER — Other Ambulatory Visit: Payer: Self-pay | Admitting: *Deleted

## 2021-10-01 DIAGNOSIS — O99212 Obesity complicating pregnancy, second trimester: Secondary | ICD-10-CM

## 2021-10-01 DIAGNOSIS — O09299 Supervision of pregnancy with other poor reproductive or obstetric history, unspecified trimester: Secondary | ICD-10-CM

## 2021-10-01 DIAGNOSIS — O10912 Unspecified pre-existing hypertension complicating pregnancy, second trimester: Secondary | ICD-10-CM

## 2021-10-16 ENCOUNTER — Encounter: Payer: Medicaid Other | Admitting: Obstetrics and Gynecology

## 2021-10-17 ENCOUNTER — Encounter: Payer: Self-pay | Admitting: Obstetrics

## 2021-10-17 ENCOUNTER — Ambulatory Visit (INDEPENDENT_AMBULATORY_CARE_PROVIDER_SITE_OTHER): Payer: Medicaid Other | Admitting: Obstetrics

## 2021-10-17 VITALS — BP 135/85 | HR 99 | Wt 259.0 lb

## 2021-10-17 DIAGNOSIS — M549 Dorsalgia, unspecified: Secondary | ICD-10-CM

## 2021-10-17 DIAGNOSIS — O0990 Supervision of high risk pregnancy, unspecified, unspecified trimester: Secondary | ICD-10-CM

## 2021-10-17 DIAGNOSIS — O09299 Supervision of pregnancy with other poor reproductive or obstetric history, unspecified trimester: Secondary | ICD-10-CM

## 2021-10-17 DIAGNOSIS — O10919 Unspecified pre-existing hypertension complicating pregnancy, unspecified trimester: Secondary | ICD-10-CM

## 2021-10-17 DIAGNOSIS — Z8632 Personal history of gestational diabetes: Secondary | ICD-10-CM

## 2021-10-17 DIAGNOSIS — O99210 Obesity complicating pregnancy, unspecified trimester: Secondary | ICD-10-CM

## 2021-10-17 MED ORDER — COMFORT FIT MATERNITY SUPP SM MISC: 0 refills | Status: DC

## 2021-10-17 NOTE — Progress Notes (Signed)
Pt presents for ROB without complaints today.

## 2021-10-17 NOTE — Progress Notes (Signed)
Subjective:  Alexis Ellis is a 31 y.o. [phone] at 59w5dbeing seen today for ongoing prenatal care.  She is currently monitored for the following issues for this high-risk pregnancy and has ADD (attention deficit disorder); HTN (hypertension); Anxiety; Class 2 obesity due to excess calories with body mass index (BMI) of 38.0 to 38.9 in adult; Supervision of high risk pregnancy, antepartum; Chronic hypertension affecting pregnancy; History of pre-eclampsia in prior pregnancy, currently pregnant; History of preterm delivery, currently pregnant; and History of gestational diabetes in prior pregnancy, currently pregnant on their problem list.  Patient reports heartburn.  Contractions: Not present. Vag. Bleeding: None.  Movement: Present. Denies leaking of fluid.   The following portions of the patient's history were reviewed and updated as appropriate: allergies, current medications, past family history, past medical history, past social history, past surgical history and problem list. Problem list updated.  Objective:   Vitals:   10/17/21 1044  BP: 135/85  Pulse: 99  Weight: 259 lb (117.5 kg)    Fetal Status: Fetal Heart Rate (bpm): 148      Movement: Present     General:  Alert, oriented and cooperative. Patient is in no acute distress.  Skin: Skin is warm and dry. No rash noted.   Cardiovascular: Normal heart rate noted  Respiratory: Normal respiratory effort, no problems with respiration noted  Abdomen: Soft, gravid, appropriate for gestational age. Pain/Pressure: Absent     Pelvic:  Cervical exam deferred        Extremities: Normal range of motion.  Edema: Trace  Mental Status: Normal mood and affect. Normal behavior. Normal judgment and thought content.   Urinalysis:      Assessment and Plan:  Pregnancy: [id number] 267w5d1. Supervision of high risk pregnancy, antepartum  2. Chronic hypertension affecting pregnancy - BP is stable on Labetalol  3. History of pre-eclampsia in  prior pregnancy, currently pregnant - taking Baby AS  4. History of gestational diabetes in prior pregnancy, currently pregnant  5. Backache symptom Rx: - Elastic Bandages & Supports (COMFORT FIT MATERNITY SUPP SM) MISC; Wear as directed.  Dispense: 1 each; Refill: 0  6. Obesity affecting pregnancy, antepartum    Preterm labor symptoms and general obstetric precautions including but not limited to vaginal bleeding, contractions, leaking of fluid and fetal movement were reviewed in detail with the patient. Please refer to After Visit Summary for other counseling recommendations.   Return in about 2 weeks (around 10/31/2021) [facility].   HaShelly BombardMD  10/17/21

## 2021-10-29 ENCOUNTER — Encounter: Payer: Self-pay | Admitting: Obstetrics and Gynecology

## 2021-10-29 ENCOUNTER — Ambulatory Visit: Payer: Medicaid Other | Attending: Maternal & Fetal Medicine

## 2021-10-29 ENCOUNTER — Ambulatory Visit: Payer: Medicaid Other | Admitting: *Deleted

## 2021-10-29 ENCOUNTER — Encounter: Payer: Self-pay | Admitting: *Deleted

## 2021-10-29 ENCOUNTER — Other Ambulatory Visit: Payer: Self-pay | Admitting: Obstetrics & Gynecology

## 2021-10-29 ENCOUNTER — Other Ambulatory Visit: Payer: Self-pay

## 2021-10-29 VITALS — BP 131/72 | HR 96

## 2021-10-29 DIAGNOSIS — O99212 Obesity complicating pregnancy, second trimester: Secondary | ICD-10-CM | POA: Diagnosis not present

## 2021-10-29 DIAGNOSIS — O0990 Supervision of high risk pregnancy, unspecified, unspecified trimester: Secondary | ICD-10-CM | POA: Insufficient documentation

## 2021-10-29 DIAGNOSIS — O10919 Unspecified pre-existing hypertension complicating pregnancy, unspecified trimester: Secondary | ICD-10-CM

## 2021-10-29 DIAGNOSIS — O09213 Supervision of pregnancy with history of pre-term labor, third trimester: Secondary | ICD-10-CM | POA: Diagnosis not present

## 2021-10-29 DIAGNOSIS — Z3A28 28 weeks gestation of pregnancy: Secondary | ICD-10-CM

## 2021-10-29 DIAGNOSIS — O09293 Supervision of pregnancy with other poor reproductive or obstetric history, third trimester: Secondary | ICD-10-CM

## 2021-10-29 DIAGNOSIS — O99213 Obesity complicating pregnancy, third trimester: Secondary | ICD-10-CM | POA: Diagnosis not present

## 2021-10-29 DIAGNOSIS — O10013 Pre-existing essential hypertension complicating pregnancy, third trimester: Secondary | ICD-10-CM

## 2021-10-29 DIAGNOSIS — O10912 Unspecified pre-existing hypertension complicating pregnancy, second trimester: Secondary | ICD-10-CM | POA: Insufficient documentation

## 2021-10-29 DIAGNOSIS — O09299 Supervision of pregnancy with other poor reproductive or obstetric history, unspecified trimester: Secondary | ICD-10-CM | POA: Insufficient documentation

## 2021-10-29 DIAGNOSIS — E669 Obesity, unspecified: Secondary | ICD-10-CM

## 2021-10-29 MED ORDER — LABETALOL HCL 200 MG PO TABS: 400.0000 mg | ORAL_TABLET | Freq: Two times a day (BID) | ORAL | 1 refills | Status: DC

## 2021-10-30 ENCOUNTER — Other Ambulatory Visit: Payer: Self-pay | Admitting: Obstetrics and Gynecology

## 2021-10-30 ENCOUNTER — Other Ambulatory Visit: Payer: Self-pay | Admitting: *Deleted

## 2021-10-30 DIAGNOSIS — O10919 Unspecified pre-existing hypertension complicating pregnancy, unspecified trimester: Secondary | ICD-10-CM

## 2021-11-05 ENCOUNTER — Encounter: Payer: Medicaid Other | Admitting: Obstetrics and Gynecology

## 2021-11-05 ENCOUNTER — Other Ambulatory Visit: Payer: Medicaid Other

## 2021-11-08 ENCOUNTER — Ambulatory Visit (INDEPENDENT_AMBULATORY_CARE_PROVIDER_SITE_OTHER): Payer: Medicaid Other | Admitting: Obstetrics and Gynecology

## 2021-11-08 ENCOUNTER — Encounter: Payer: Self-pay | Admitting: Obstetrics and Gynecology

## 2021-11-08 VITALS — BP 123/81 | HR 105 | Wt 262.0 lb

## 2021-11-08 DIAGNOSIS — O09293 Supervision of pregnancy with other poor reproductive or obstetric history, third trimester: Secondary | ICD-10-CM

## 2021-11-08 DIAGNOSIS — Z3A29 29 weeks gestation of pregnancy: Secondary | ICD-10-CM

## 2021-11-08 DIAGNOSIS — Z8632 Personal history of gestational diabetes: Secondary | ICD-10-CM

## 2021-11-08 DIAGNOSIS — Z23 Encounter for immunization: Secondary | ICD-10-CM

## 2021-11-08 DIAGNOSIS — O10913 Unspecified pre-existing hypertension complicating pregnancy, third trimester: Secondary | ICD-10-CM

## 2021-11-08 DIAGNOSIS — O09899 Supervision of other high risk pregnancies, unspecified trimester: Secondary | ICD-10-CM

## 2021-11-08 DIAGNOSIS — O0990 Supervision of high risk pregnancy, unspecified, unspecified trimester: Secondary | ICD-10-CM | POA: Diagnosis not present

## 2021-11-08 DIAGNOSIS — O09893 Supervision of other high risk pregnancies, third trimester: Secondary | ICD-10-CM

## 2021-11-08 DIAGNOSIS — O09299 Supervision of pregnancy with other poor reproductive or obstetric history, unspecified trimester: Secondary | ICD-10-CM

## 2021-11-08 DIAGNOSIS — O0993 Supervision of high risk pregnancy, unspecified, third trimester: Secondary | ICD-10-CM

## 2021-11-08 DIAGNOSIS — O10919 Unspecified pre-existing hypertension complicating pregnancy, unspecified trimester: Secondary | ICD-10-CM

## 2021-11-08 NOTE — Patient Instructions (Signed)

## 2021-11-08 NOTE — Progress Notes (Signed)
Pt presents for ROB and 2 gtt labs.  Did not take BP meds today  Tdap given RD without difficulty  PHQ9= 1 GAD7= 5

## 2021-11-08 NOTE — Progress Notes (Signed)
Subjective:  Alexis Ellis is a 31 y.o. [phone] at 31w6dbeing seen today for ongoing prenatal care.  She is currently monitored for the following issues for this high-risk pregnancy and has ADD (attention deficit disorder); HTN (hypertension); Anxiety; Class 2 obesity due to excess calories with body mass index (BMI) of 38.0 to 38.9 in adult; Supervision of high risk pregnancy, antepartum; Chronic hypertension affecting pregnancy; History of pre-eclampsia in prior pregnancy, currently pregnant; History of preterm delivery, currently pregnant; and History of gestational diabetes in prior pregnancy, currently pregnant on their problem list.  Patient reports general discomforts of pregnancy.  Contractions: Not present. Vag. Bleeding: None.  Movement: Present. Denies leaking of fluid.   The following portions of the patient's history were reviewed and updated as appropriate: allergies, current medications, past family history, past medical history, past social history, past surgical history and problem list. Problem list updated.  Objective:   Vitals:   11/08/21 0858  BP: 123/81  Pulse: (!) 105  Weight: 262 lb (118.8 kg)    Fetal Status: Fetal Heart Rate (bpm): 143   Movement: Present     General:  Alert, oriented and cooperative. Patient is in no acute distress.  Skin: Skin is warm and dry. No rash noted.   Cardiovascular: Normal heart rate noted  Respiratory: Normal respiratory effort, no problems with respiration noted  Abdomen: Soft, gravid, appropriate for gestational age. Pain/Pressure: Absent     Pelvic:  Cervical exam deferred        Extremities: Normal range of motion.  Edema: Moderate pitting, indentation subsides rapidly  Mental Status: Normal mood and affect. Normal behavior. Normal judgment and thought content.   Urinalysis:      Assessment and Plan:  Pregnancy: [id number] 236w6d1. Supervision of high risk pregnancy, antepartum Stable - Glucose Tolerance, 2 Hours w/1  Hour - RPR - CBC - HIV Antibody (routine testing w rflx)  2. History of preterm delivery, currently pregnant Due to S[facility] 3. History of pre-eclampsia in prior pregnancy, currently pregnant Stable No S/sx at present Qd BASA  4. History of gestational diabetes in prior pregnancy, currently pregnant Nl A1c Glucola today  5. Chronic hypertension affecting pregnancy BP stable Continue with current treatment Serial growth scans and antenatal testing as per MFM  Preterm labor symptoms and general obstetric precautions including but not limited to vaginal bleeding, contractions, leaking of fluid and fetal movement were reviewed in detail with the patient. Please refer to After Visit Summary for other counseling recommendations.  Return in about 2 weeks (around 11/22/2021) for OB visit, face to face, MD only.   ErChancy MilroyMD

## 2021-11-09 ENCOUNTER — Other Ambulatory Visit: Payer: Self-pay | Admitting: Obstetrics and Gynecology

## 2021-11-09 ENCOUNTER — Encounter: Payer: Self-pay | Admitting: Obstetrics and Gynecology

## 2021-11-09 ENCOUNTER — Encounter: Payer: Self-pay | Admitting: Obstetrics & Gynecology

## 2021-11-09 DIAGNOSIS — O10919 Unspecified pre-existing hypertension complicating pregnancy, unspecified trimester: Secondary | ICD-10-CM

## 2021-11-09 DIAGNOSIS — O24419 Gestational diabetes mellitus in pregnancy, unspecified control: Secondary | ICD-10-CM | POA: Insufficient documentation

## 2021-11-09 LAB — GLUCOSE TOLERANCE, 2 HOURS W/ 1HR
Glucose, 1 hour: 203 mg/dL — ABNORMAL HIGH (ref 70–179)
Glucose, 2 hour: 111 mg/dL (ref 70–152)
Glucose, Fasting: 90 mg/dL (ref 70–91)

## 2021-11-09 LAB — CBC
Hematocrit: 37.3 % (ref 34.0–46.6)
Hemoglobin: 12.4 g/dL (ref 11.1–15.9)
MCH: 28.4 pg (ref 26.6–33.0)
MCHC: 33.2 g/dL (ref 31.5–35.7)
MCV: 85 fL (ref 79–97)
Platelets: 247 10*3/uL (ref 150–450)
RBC: 4.37 x10E6/uL (ref 3.77–5.28)
RDW: 13.1 % (ref 11.7–15.4)
WBC: 10.4 10*3/uL (ref 3.4–10.8)

## 2021-11-09 LAB — RPR: RPR Ser Ql: NONREACTIVE

## 2021-11-09 LAB — HIV ANTIBODY (ROUTINE TESTING W REFLEX): HIV Screen 4th Generation wRfx: NONREACTIVE

## 2021-11-12 ENCOUNTER — Other Ambulatory Visit: Payer: Self-pay | Admitting: *Deleted

## 2021-11-12 DIAGNOSIS — O24419 Gestational diabetes mellitus in pregnancy, unspecified control: Secondary | ICD-10-CM

## 2021-11-12 MED ORDER — ACCU-CHEK GUIDE VI STRP
ORAL_STRIP | 6 refills | Status: DC
Start: 2021-11-12 — End: 2022-04-22

## 2021-11-12 MED ORDER — ACCU-CHEK SOFTCLIX LANCETS MISC: 12 refills | Status: DC

## 2021-11-12 MED ORDER — ACCU-CHEK GUIDE W/DEVICE KIT: PACK | 0 refills | Status: DC

## 2021-11-12 NOTE — Progress Notes (Signed)
Glucose testing supplies and N&D referral ordered today.  Pt aware.

## 2021-11-13 ENCOUNTER — Telehealth: Payer: Medicaid Other | Admitting: Physician Assistant

## 2021-11-13 DIAGNOSIS — Z3493 Encounter for supervision of normal pregnancy, unspecified, third trimester: Secondary | ICD-10-CM

## 2021-11-13 DIAGNOSIS — L709 Acne, unspecified: Secondary | ICD-10-CM

## 2021-11-13 NOTE — Progress Notes (Signed)
For the safety of you and your child, I recommend you contact your OB to discuss treatments for acne during pregnancy.   Many mothers need to take medicines during their pregnancy and while nursing.  Almost all medicines pass into the breast milk in small quantities.  Most are generally considered safe for a mother to take but some medicines must be avoided.  After reviewing your E-Visit request, I recommend that you consult your OB/GYN or pediatrician for medical advice in relation to your condition and prescription medications while pregnant or breastfeeding.  NOTE:  There will be NO CHARGE for this eVisit  If you are having a true medical emergency please call 911.    For an urgent face to face visit, [facility] has six urgent care centers for your convenience:     [facility] [place] [facility] [place] Get Driving Directions [phone] [place], [place] [address]    [facility]) Get Driving Directions [phone] [place], [place] [address]  [facility] [place] ([place]) Get Driving Directions [phone] [address] [place],  [place]  [address]  [facility] Get Driving Directions [phone] [place], [place], [place] [address]   [facility] Get Driving Directions  [phone] [address] [place], [place] [address]   [facility] [place] Get Driving Directions [phone] [address] [place], [place] [address]  Your MyChart E-visit questionnaire answers were reviewed by a board certified advanced clinical practitioner to complete your personal care plan based on your specific symptoms.  Thank you for using e-Visits.

## 2021-11-17 ENCOUNTER — Encounter: Payer: Self-pay | Admitting: Obstetrics and Gynecology

## 2021-11-19 ENCOUNTER — Telehealth: Payer: Self-pay

## 2021-11-19 NOTE — Telephone Encounter (Signed)
Pt called triage c/o swelling in her legs, ankles, and feet. She is [redacted]w[redacted]d GDM, CHTN, no rx for BP meds, home BP is 124/67, and she is taking baby ASA. Headaches are improved with Tylenol and she denies visual disturbances. She said her finger leaves an indention as she presses on her skin. She admits she prefers sweet tea over water. Informed pt swelling is normal in the 3rd trimester, to increase her water intake, and elevated her feet above the heart. She admits her anxiety is super high since she delivered at 33 weeks d/t pre-eclampsia last pregnancy. Consulted with Dr. ARoselie Awkwardfor additional advice, pt to continue to monitor BP, continue ASA 81, and keep upcoming appt.  Pt agreed, and has no further questions.

## 2021-11-22 ENCOUNTER — Ambulatory Visit (INDEPENDENT_AMBULATORY_CARE_PROVIDER_SITE_OTHER): Payer: Medicaid Other | Admitting: Obstetrics and Gynecology

## 2021-11-22 ENCOUNTER — Encounter: Payer: Medicaid Other | Admitting: Obstetrics and Gynecology

## 2021-11-22 VITALS — BP 119/81 | HR 98 | Wt 263.0 lb

## 2021-11-22 DIAGNOSIS — Z3A31 31 weeks gestation of pregnancy: Secondary | ICD-10-CM

## 2021-11-22 DIAGNOSIS — O10919 Unspecified pre-existing hypertension complicating pregnancy, unspecified trimester: Secondary | ICD-10-CM

## 2021-11-22 DIAGNOSIS — O09893 Supervision of other high risk pregnancies, third trimester: Secondary | ICD-10-CM

## 2021-11-22 DIAGNOSIS — O09299 Supervision of pregnancy with other poor reproductive or obstetric history, unspecified trimester: Secondary | ICD-10-CM

## 2021-11-22 DIAGNOSIS — O0990 Supervision of high risk pregnancy, unspecified, unspecified trimester: Secondary | ICD-10-CM

## 2021-11-22 DIAGNOSIS — O24410 Gestational diabetes mellitus in pregnancy, diet controlled: Secondary | ICD-10-CM

## 2021-11-22 DIAGNOSIS — O09899 Supervision of other high risk pregnancies, unspecified trimester: Secondary | ICD-10-CM

## 2021-11-22 DIAGNOSIS — O10913 Unspecified pre-existing hypertension complicating pregnancy, third trimester: Secondary | ICD-10-CM

## 2021-11-22 DIAGNOSIS — O09293 Supervision of pregnancy with other poor reproductive or obstetric history, third trimester: Secondary | ICD-10-CM

## 2021-11-22 NOTE — Progress Notes (Signed)
ROB 31.[redacted]wk GA GDM, has meter and supplies, had GDM with prev. Pregnancy so did not need diabetes education again.  Does not have log but can recall value ranges.  BP rechecked with larger cuff. Pt reports BPs WNL at home, logs on Bab[employer]s.

## 2021-11-22 NOTE — Progress Notes (Signed)
   PRENATAL VISIT NOTE  Subjective:  Alexis Ellis is a 31 y.o. [phone] at 9w6dbeing seen today for ongoing prenatal care.  She is currently monitored for the following issues for this high-risk pregnancy and has ADD (attention deficit disorder); HTN (hypertension); Anxiety; Class 2 obesity due to excess calories with body mass index (BMI) of 38.0 to 38.9 in adult; Supervision of high risk pregnancy, antepartum; Chronic hypertension affecting pregnancy; History of pre-eclampsia in prior pregnancy, currently pregnant; History of preterm delivery, currently pregnant; History of gestational diabetes in prior pregnancy, currently pregnant; and Gestational diabetes on their problem list.  Patient doing well with no acute concerns today. She reports  occasional sciatica .  Contractions: Not present. Vag. Bleeding: None.  Movement: Present. Denies leaking of fluid.   The following portions of the patient's history were reviewed and updated as appropriate: allergies, current medications, past family history, past medical history, past social history, past surgical history and problem list. Problem list updated.  Objective:   Vitals:   11/22/21 1450 11/22/21 1457  BP: (!) 148/91 119/81  Pulse: 97 98  Weight: 263 lb (119.3 kg)     Fetal Status: Fetal Heart Rate (bpm): 150   Movement: Present     General:  Alert, oriented and cooperative. Patient is in no acute distress.  Skin: Skin is warm and dry. No rash noted.   Cardiovascular: Normal heart rate noted  Respiratory: Normal respiratory effort, no problems with respiration noted  Abdomen: Soft, gravid, appropriate for gestational age.  Pain/Pressure: Absent     Pelvic: Cervical exam deferred        Extremities: Normal range of motion.  Edema: Deep pitting, indentation remains for a short time  Mental Status:  Normal mood and affect. Normal behavior. Normal judgment and thought content.   Assessment and Plan:  Pregnancy: [id number]  322w6d1. [redacted] weeks gestation of pregnancy   2. Chronic hypertension affecting pregnancy BP under control with labetalol, weekly testing intiated  3. Supervision of high risk pregnancy, antepartum Continue routine care  4. History of pre-eclampsia in prior pregnancy, currently pregnant No s/sx of preeclampsia  5. History of preterm delivery, currently pregnant No s/sx of preterm labor  6. Diet controlled gestational diabetes mellitus (GDM) in third trimester FBS: 90's per pt PPBS: pt was taking BS at hour 1 not hour 2, pt educated on proper protocol Log sheets given, reassess in 2 weeks  Preterm labor symptoms and general obstetric precautions including but not limited to vaginal bleeding, contractions, leaking of fluid and fetal movement were reviewed in detail with the patient.  Please refer to After Visit Summary for other counseling recommendations.   Return in about 2 weeks (around 12/06/2021) [facility]   LaLynnda ShieldsMD Faculty Attending [facility]

## 2021-11-26 ENCOUNTER — Encounter: Payer: Self-pay | Admitting: *Deleted

## 2021-11-26 ENCOUNTER — Ambulatory Visit: Payer: Medicaid Other | Admitting: *Deleted

## 2021-11-26 ENCOUNTER — Ambulatory Visit: Payer: Medicaid Other | Attending: Obstetrics and Gynecology | Admitting: Obstetrics and Gynecology

## 2021-11-26 ENCOUNTER — Ambulatory Visit: Payer: Medicaid Other | Attending: Obstetrics

## 2021-11-26 VITALS — BP 134/75 | HR 86

## 2021-11-26 DIAGNOSIS — O24410 Gestational diabetes mellitus in pregnancy, diet controlled: Secondary | ICD-10-CM

## 2021-11-26 DIAGNOSIS — O10013 Pre-existing essential hypertension complicating pregnancy, third trimester: Secondary | ICD-10-CM

## 2021-11-26 DIAGNOSIS — O0990 Supervision of high risk pregnancy, unspecified, unspecified trimester: Secondary | ICD-10-CM | POA: Insufficient documentation

## 2021-11-26 DIAGNOSIS — O09213 Supervision of pregnancy with history of pre-term labor, third trimester: Secondary | ICD-10-CM | POA: Diagnosis not present

## 2021-11-26 DIAGNOSIS — O09293 Supervision of pregnancy with other poor reproductive or obstetric history, third trimester: Secondary | ICD-10-CM | POA: Diagnosis not present

## 2021-11-26 DIAGNOSIS — Z3A32 32 weeks gestation of pregnancy: Secondary | ICD-10-CM | POA: Diagnosis not present

## 2021-11-26 DIAGNOSIS — O99213 Obesity complicating pregnancy, third trimester: Secondary | ICD-10-CM

## 2021-11-26 DIAGNOSIS — O10919 Unspecified pre-existing hypertension complicating pregnancy, unspecified trimester: Secondary | ICD-10-CM

## 2021-11-26 DIAGNOSIS — E669 Obesity, unspecified: Secondary | ICD-10-CM | POA: Diagnosis not present

## 2021-11-26 NOTE — Progress Notes (Signed)
Maternal-Fetal Medicine   Name: Alexis Ellis DOB: 11-09-1990 MRN: [id number] Referring Provider: Lynnda Shields, MD  I had the pleasure of seeing [name] today at the [place] [facility].  She is return for fetal growth assessment and antenatal testing. She has a new diagnosis of gestational diabetes.  Patient has started checking her blood glucose levels and reports increased postbreakfast levels.  Fasting levels are reportedly within normal range. Her previous pregnancy was complicated by gestational diabetes that was well controlled on diet.  Patient has chronic hypertension and takes labetalol.  Blood pressure today at her office is 134/75 mmHg.  Ultrasound Fetal growth is appropriate for gestational age.  Amniotic fluid is normal and good fetal activity seen.  Antenatal testing is reassuring.  BPP 8/8.  Cephalic presentation.  Gestational diabetes I explained the diagnosis of gestational diabetes.  I emphasized the importance of good blood glucose control to prevent adverse fetal or neonatal outcomes.  I discussed normal range of blood glucose values. I encouraged her to check her blood glucose regularly. Possible complications of gestational diabetes include fetal macrosomia, shoulder dystocia and birth injuries, stillbirth (in poorly controlled diabetes) and neonatal respiratory syndrome and other complications.  In about 85% of cases, gestational diabetes is well controlled by diet alone.  Exercise reduces the need for insulin.  Medical treatment includes oral hypoglycemics or insulin.  Timing of delivery: Patient has chronic hypertension and takes antihypertensives.  Since she has comorbid conditions of diabetes and hypertension, delivery may be considered at [redacted] weeks gestation.   Type 2 diabetes develops in up to 50% of women with GDM. I recommend postpartum screening with 75-g glucose load at 6 to 12 weeks after delivery.  Recommendations -Continue weekly BPP till  delivery.  Thank you for consultation.  If you have any questions or concerns, please contact me the [facility].  Consultation including face-to-face (more than 50%) counseling 20 minutes.

## 2021-11-29 ENCOUNTER — Other Ambulatory Visit: Payer: Self-pay | Admitting: *Deleted

## 2021-11-29 DIAGNOSIS — O10913 Unspecified pre-existing hypertension complicating pregnancy, third trimester: Secondary | ICD-10-CM

## 2021-11-29 DIAGNOSIS — O24419 Gestational diabetes mellitus in pregnancy, unspecified control: Secondary | ICD-10-CM

## 2021-12-04 ENCOUNTER — Encounter: Payer: Self-pay | Admitting: *Deleted

## 2021-12-04 ENCOUNTER — Ambulatory Visit: Payer: Medicaid Other | Attending: Obstetrics

## 2021-12-04 ENCOUNTER — Ambulatory Visit: Payer: Medicaid Other | Admitting: *Deleted

## 2021-12-04 VITALS — BP 131/71 | HR 88

## 2021-12-04 DIAGNOSIS — O09293 Supervision of pregnancy with other poor reproductive or obstetric history, third trimester: Secondary | ICD-10-CM

## 2021-12-04 DIAGNOSIS — O24410 Gestational diabetes mellitus in pregnancy, diet controlled: Secondary | ICD-10-CM

## 2021-12-04 DIAGNOSIS — O10919 Unspecified pre-existing hypertension complicating pregnancy, unspecified trimester: Secondary | ICD-10-CM | POA: Diagnosis not present

## 2021-12-04 DIAGNOSIS — O99213 Obesity complicating pregnancy, third trimester: Secondary | ICD-10-CM

## 2021-12-04 DIAGNOSIS — Z3A33 33 weeks gestation of pregnancy: Secondary | ICD-10-CM | POA: Diagnosis not present

## 2021-12-04 DIAGNOSIS — O0990 Supervision of high risk pregnancy, unspecified, unspecified trimester: Secondary | ICD-10-CM | POA: Insufficient documentation

## 2021-12-04 DIAGNOSIS — E669 Obesity, unspecified: Secondary | ICD-10-CM | POA: Diagnosis not present

## 2021-12-04 DIAGNOSIS — O09213 Supervision of pregnancy with history of pre-term labor, third trimester: Secondary | ICD-10-CM | POA: Diagnosis not present

## 2021-12-04 DIAGNOSIS — O10013 Pre-existing essential hypertension complicating pregnancy, third trimester: Secondary | ICD-10-CM | POA: Diagnosis not present

## 2021-12-06 ENCOUNTER — Encounter: Payer: Self-pay | Admitting: Obstetrics and Gynecology

## 2021-12-06 ENCOUNTER — Telehealth (INDEPENDENT_AMBULATORY_CARE_PROVIDER_SITE_OTHER): Payer: Medicaid Other | Admitting: Family Medicine

## 2021-12-06 DIAGNOSIS — O09299 Supervision of pregnancy with other poor reproductive or obstetric history, unspecified trimester: Secondary | ICD-10-CM

## 2021-12-06 DIAGNOSIS — O24410 Gestational diabetes mellitus in pregnancy, diet controlled: Secondary | ICD-10-CM

## 2021-12-06 DIAGNOSIS — O0990 Supervision of high risk pregnancy, unspecified, unspecified trimester: Secondary | ICD-10-CM

## 2021-12-06 DIAGNOSIS — I10 Essential (primary) hypertension: Secondary | ICD-10-CM

## 2021-12-06 DIAGNOSIS — Z6838 Body mass index (BMI) 38.0-38.9, adult: Secondary | ICD-10-CM

## 2021-12-06 DIAGNOSIS — O09899 Supervision of other high risk pregnancies, unspecified trimester: Secondary | ICD-10-CM

## 2021-12-06 NOTE — Progress Notes (Signed)
OBSTETRICS PRENATAL VIRTUAL VISIT ENCOUNTER NOTE  Provider location: [facility]   Patient location: Home  I connected with Alexis Ellis on 12/06/21 at  9:35 AM EDT by MyChart Video Encounter and verified that I am speaking with the correct person using two identifiers. I discussed the limitations, risks, security and privacy concerns of performing an evaluation and management service virtually and the availability of in person appointments. I also discussed with the patient that there may be a patient responsible charge related to this service. The patient expressed understanding and agreed to proceed. Subjective:  Alexis Ellis is a 31 y.o. [phone] at 49w6dbeing seen today for ongoing prenatal care.  She is currently monitored for the following issues for this high-risk pregnancy and has ADD (attention deficit disorder); HTN (hypertension); Anxiety; Class 2 obesity due to excess calories with body mass index (BMI) of 38.0 to 38.9 in adult; Supervision of high risk pregnancy, antepartum; Chronic hypertension affecting pregnancy; History of pre-eclampsia in prior pregnancy, currently pregnant; History of preterm delivery, currently pregnant; History of gestational diabetes in prior pregnancy, currently pregnant; and Gestational diabetes on their problem list.  Patient reports no complaints.  Contractions: Not present. Vag. Bleeding: None.  Movement: Present. Denies any leaking of fluid.   The following portions of the patient's history were reviewed and updated as appropriate: allergies, current medications, past family history, past medical history, past social history, past surgical history and problem list.   Objective:  There were no vitals filed for this visit.  Fetal Status:     Movement: Present     General:  Alert, oriented and cooperative. Patient is in no acute distress.  Respiratory: Normal respiratory effort, no problems with respiration noted   Mental Status: Normal mood and affect. Normal behavior. Normal judgment and thought content.  Rest of physical exam deferred due to type of encounter  Imaging: [place]FM FETAL BPP WO NON STRESS  Result Date: 12/04/2021 ----------------------------------------------------------------------  OBSTETRICS REPORT                       (Signed Final 12/04/2021 04:51 pm) ---------------------------------------------------------------------- Patient Info  ID #:       [id number]                         D.O.B.:  [birth year](31 yrs)  Name:       Alexis Ellis               Visit Date: 12/04/2021 04:15 pm ---------------------------------------------------------------------- Performed By  Attending:        VJohnell ComingsMD         Ref. Address:     [address]                                                            [place]  Performed By:     Rosendo Gros      [facility] at                                                             [place] for                                                             Women  Referred By:      Chancy Milroy                    MD ---------------------------------------------------------------------- Orders  #  Description                           Code        Ordered By  1  [place] MFM FETAL BPP WO NON               76819.01    YU FANG     STRESS ----------------------------------------------------------------------  #  Order #                     Accession #                Episode #  1  [id number]                   [id number]                 [id number] ---------------------------------------------------------------------- Indications  Hypertension - Chronic/Pre-existing            O10.019  (labetalol)  Gestational diabetes in pregnancy, diet        O24.410  controlled  Poor obstetric history: Previous                O09.299  preeclampsia / eclampsia/gestational HTN  Obesity complicating pregnancy, third          O99.213  trimester (BMI =39)  Poor obstetric history: Previous preterm       O09.219  delivery, antepartum (33w)  [redacted] weeks gestation of pregnancy                Z3A.33  Low fetal fraction X2  Poor obstetric history: Previous gestational   O09.299  diabetes ---------------------------------------------------------------------- Vital Signs                                                 Height:        5'5" ---------------------------------------------------------------------- Fetal Evaluation  Num Of Fetuses:         1  Fetal Heart Rate(bpm):  143  Cardiac Activity:       Observed  Presentation:           Cephalic  Placenta:               Posterior  P. Cord Insertion:      Previously Visualized  Amniotic Fluid  AFI FV:      Within normal limits  AFI Sum(cm)     %Tile       Largest Pocket(cm)  16.08           58          6.26  RUQ(cm)       RLQ(cm)       LUQ(cm)        LLQ(cm)  6.26          4.69          1.42           3.71 ---------------------------------------------------------------------- Biophysical Evaluation  Amniotic F.V:   Within normal limits       F. Tone:        Observed  F. Movement:    Observed                   Score:          8/8  F. Breathing:   Observed ---------------------------------------------------------------------- OB History  Gravidity:    6         Prem:   1         SAB:   3  Ectopic:      1 ---------------------------------------------------------------------- Gestational Age  LMP:           33w 4d        Date:  04/13/21                  EDD:   01/18/22  Best:          33w 4d     Det. By:  LMP  (04/13/21)          EDD:   01/18/22 ---------------------------------------------------------------------- Anatomy  Cranium:               Appears normal         LVOT:                   Previously seen  Cavum:                 Previously seen        Aortic Arch:            Previously seen  Ventricles:             Appears normal         Ductal Arch:            Previously seen  Choroid Plexus:        Previously seen        Diaphragm:              Appears normal  Cerebellum:            Previously seen        Stomach:                Appears normal, left  sided  Posterior Fossa:       Previously seen        Abdomen:                Previously seen  Nuchal Fold:           Not applicable (>16    Abdominal Wall:         Previously seen                         wks GA)  Face:                  Orbits and profile     Cord Vessels:           Previously seen                         previously seen  Lips:                  Previously seen        Kidneys:                Appear normal  Palate:                Previously seen        Bladder:                Appears normal  Thoracic:              Previously seen        Spine:                  Previously seen  Heart:                 Previously seen        Upper Extremities:      Previously seen  RVOT:                  Previously seen        Lower Extremities:      Previously seen  Other:  Technically difficult due to maternal habitus and fetal position. ---------------------------------------------------------------------- Comments  This patient was seen for a BPP due to chronic hypertension  treated with labetalol and diet-controlled gestational diabetes.  She denies any problems since her last exam.  A biophysical profile performed today was 8 out of 8.  There was normal amniotic fluid noted on today's ultrasound  exam.  She will return in 1 week for another BPP. ----------------------------------------------------------------------                   Johnell Comings, MD Electronically Signed Final Report   12/04/2021 04:51 pm ----------------------------------------------------------------------  [place] MFM OB FOLLOW UP  Result Date: 11/26/2021 ----------------------------------------------------------------------  OBSTETRICS  REPORT                       (Signed Final 11/26/2021 05:15 pm) ---------------------------------------------------------------------- Patient Info  ID #:       [id number]                          D.O.B.:  12-23-1990 (30 yrs)  Name:       Alexis Ellis                Visit Date: 11/26/2021 03:43 pm ---------------------------------------------------------------------- Performed By  Attending:  Tama High MD        Ref. Address:     [place], [place]  Performed By:     Eveline Keto         [facility]                                     [facility] at                                                             [place] for                                                             Women  Referred By:      Chancy Milroy                    MD ---------------------------------------------------------------------- Orders  #  Description                           Code        Ordered By  1  [place] MFM OB FOLLOW UP                   76816.01    YU FANG  2  [place] MFM FETAL BPP WO NON               69678.93    YU FANG     STRESS ----------------------------------------------------------------------  #  Order #                     Accession #                Episode #  1  [id number]                   [id number]                 [id number]  2  [id number]                   [id number]                 [id number] ---------------------------------------------------------------------- Indications  [redacted] weeks gestation of pregnancy                Z3A.32  Hypertension - Chronic/Pre-existing            O10.019  (labetalol)  Gestational diabetes in pregnancy, diet        O24.410  controlled  Poor obstetric history: Previous               O09.299  preeclampsia / eclampsia/gestational HTN   Low fetal fraction X2  Obesity complicating pregnancy, third          O99.213  trimester  Poor obstetric history: Previous preterm       O09.219  delivery, antepartum (33w)  Poor obstetric history: Previous gestational   O09.299  diabetes ---------------------------------------------------------------------- Vital Signs                                                 Height:        5'5"  BP:          134/75 ---------------------------------------------------------------------- Fetal Evaluation  Num Of Fetuses:         1  Fetal Heart Rate(bpm):  144  Cardiac Activity:       Observed  Presentation:           Cephalic  Placenta:               Posterior  P. Cord Insertion:      Previously Visualized  Amniotic Fluid  AFI FV:      Within normal limits  AFI Sum(cm)     %Tile       Largest Pocket(cm)  13.37           42          4.75  RUQ(cm)       RLQ(cm)       LUQ(cm)        LLQ(cm)  4.73          3.89          4.75           0 ---------------------------------------------------------------------- Biophysical Evaluation  Amniotic F.V:   Within normal limits       F. Tone:        Observed  F. Movement:    Observed                   Score:          8/8  F. Breathing:   Observed ---------------------------------------------------------------------- Biometry  BPD:     80.32  mm     G. Age:  32w 2d         36  %    CI:        75.36   %    70 - 86  FL/HC:      20.5   %    19.1 - 21.3  HC:    293.43   mm     G. Age:  32w 3d         14  %    HC/AC:      1.02        0.96 - 1.17  AC:    287.79   mm     G. Age:  32w 6d         61  %    FL/BPD:     75.0   %    71 - 87  FL:      60.26  mm     G. Age:  31w 2d         13  %    FL/AC:      20.9   %    20 - 24  Est. FW:    1940  gm      4 lb 4 oz     35  % ---------------------------------------------------------------------- OB History  Gravidity:    6         Prem:   1         SAB:   3  Ectopic:      1  ---------------------------------------------------------------------- Gestational Age  LMP:           32w 3d        Date:  04/13/21                  EDD:   01/18/22  U/S Today:     32w 2d                                        EDD:   01/19/22  Best:          32w 3d     Det. By:  LMP  (04/13/21)          EDD:   01/18/22 ---------------------------------------------------------------------- Anatomy  Cranium:               Appears normal         LVOT:                   Previously seen  Cavum:                 Appears normal         Aortic Arch:            Previously seen  Ventricles:            Appears normal         Ductal Arch:            Not well visualized  Choroid Plexus:        Previously seen        Diaphragm:              Appears normal  Cerebellum:            Previously seen        Stomach:                Appears normal, left  sided  Posterior Fossa:       Previously seen        Abdomen:                Appears normal  Nuchal Fold:           Not applicable (>25    Abdominal Wall:         Previously seen                         wks GA)  Face:                  Orbits and profile     Cord Vessels:           Previously seen                         previously seen  Lips:                  Previously seen        Kidneys:                Appear normal  Palate:                Previously seen        Bladder:                Appears normal  Thoracic:              Appears normal         Spine:                  Previously seen  Heart:                 Previously seen        Upper Extremities:      Previously seen  RVOT:                  Previously seen        Lower Extremities:      Previously seen  Other:  Female gender previously seen. VC and 3VV visualized. 5th digit, nasal          bone, feet  previously visualized. ---------------------------------------------------------------------- Cervix Uterus Adnexa  Cervix  Not visualized (advanced GA >24wks)  Uterus  No  abnormality visualized.  Right Ovary  Not visualized.  Left Ovary  Not visualized. ---------------------------------------------------------------------- Impression  Fetal growth is appropriate for gestational age.  Amniotic fluid  is normal and good fetal activity seen.  Antenatal testing is  reassuring.  BPP 8/8.  Cephalic presentation.  xxxxxxxxxxxxxxxxxxxxxxxxxxxxxxxxxxxxxxxx  Consultation (see EPIC )  I had the pleasure of seeing Ms. Colantuono today at the [facility].  She is return for fetal growth  assessment and antenatal testing.  She has a new diagnosis of gestational diabetes.  Patient has  started checking her blood glucose levels and reports  increased postbreakfast levels.  Fasting levels are reportedly  within normal range.  Her previous pregnancy was complicated by gestational  diabetes that was well controlled on diet.  Patient has chronic hypertension and takes labetalol.  Blood  pressure today at her office is 134/75 mmHg.  Gestational diabetes  I explained the diagnosis of gestational diabetes.  I  emphasized the importance of good blood glucose control to  prevent adverse fetal or neonatal outcomes.  I discussed  normal range of blood glucose values. I encouraged her to  check her blood glucose regularly.  Possible complications of gestational diabetes include fetal  macrosomia, shoulder dystocia and birth injuries, stillbirth (in  poorly controlled diabetes) and neonatal respiratory  syndrome and other complications.  In about 85% of cases, gestational diabetes is well controlled  by diet alone.  Exercise reduces the need for insulin.  Medical  treatment includes oral hypoglycemics or insulin.  Timing of delivery: Patient has chronic hypertension and  takes antihypertensives.  Since she has comorbid conditions  of diabetes and hypertension, delivery may be considered at  [redacted] weeks gestation.  Type 2 diabetes develops in up to 50% of women with GDM. I  recommend postpartum  screening with 75-g glucose load at  6 to 12 weeks after delivery. ---------------------------------------------------------------------- Recommendations  -Continue weekly BPP till delivery. ----------------------------------------------------------------------                 Tama High, MD Electronically Signed Final Report   11/26/2021 05:15 pm ----------------------------------------------------------------------  [place] MFM FETAL BPP WO NON STRESS  Result Date: 11/26/2021 ----------------------------------------------------------------------  OBSTETRICS REPORT                       (Signed Final 11/26/2021 05:15 pm) ---------------------------------------------------------------------- Patient Info  ID #:       [id number]                          D.O.B.:  [birth year] (30 yrs)  Name:       Alexis Ellis                Visit Date: 11/26/2021 03:43 pm ---------------------------------------------------------------------- Performed By  Attending:        Tama High MD        Ref. Address:     [place]                                                             [place], [place]  Performed By:     Eveline Keto         [facility]                                     [facility] at  [facility]                                                             [facility]  Referred By:      Chancy Milroy                    MD ---------------------------------------------------------------------- Orders  #  Description                           Code        Ordered By  1  [place] MFM OB FOLLOW UP                   [phone]    YU FANG  2  [place] MFM FETAL BPP WO NON               76819.01    YU FANG     STRESS ----------------------------------------------------------------------  #  Order #                      Accession #                Episode #  1  [id number]                   [id number]                 [id number]  2  [id number]                   [id number]                 [id number] ---------------------------------------------------------------------- Indications  [redacted] weeks gestation of pregnancy                Z3A.32  Hypertension - Chronic/Pre-existing            O10.019  (labetalol)  Gestational diabetes in pregnancy, diet        O24.410  controlled  Poor obstetric history: Previous               O09.299  preeclampsia / eclampsia/gestational HTN  Low fetal fraction X2  Obesity complicating pregnancy, third          O99.213  trimester  Poor obstetric history: Previous preterm       O09.219  delivery, antepartum (33w)  Poor obstetric history: Previous gestational   O09.299  diabetes ---------------------------------------------------------------------- Vital Signs                                                 Height:        5'5"  BP:          134/75 ---------------------------------------------------------------------- Fetal Evaluation  Num Of Fetuses:         1  Fetal Heart Rate(bpm):  144  Cardiac Activity:       Observed  Presentation:           Cephalic  Placenta:               Posterior  P. Cord Insertion:  Previously Visualized  Amniotic Fluid  AFI FV:      Within normal limits  AFI Sum(cm)     %Tile       Largest Pocket(cm)  13.37           42          4.75  RUQ(cm)       RLQ(cm)       LUQ(cm)        LLQ(cm)  4.73          3.89          4.75           0 ---------------------------------------------------------------------- Biophysical Evaluation  Amniotic F.V:   Within normal limits       F. Tone:        Observed  F. Movement:    Observed                   Score:          8/8  F. Breathing:   Observed ---------------------------------------------------------------------- Biometry  BPD:     80.32  mm     G. Age:  32w 2d         36  %    CI:        75.36   %    70 - 86                                                           FL/HC:      20.5   %    19.1 - 21.3  HC:    293.43   mm     G. Age:  32w 3d         14  %    HC/AC:      1.02        0.96 - 1.17  AC:    287.79   mm     G. Age:  32w 6d         61  %    FL/BPD:     75.0   %    71 - 87  FL:      60.26  mm     G. Age:  31w 2d         13  %    FL/AC:      20.9   %    20 - 24  Est. FW:    1940  gm      4 lb 4 oz     35  % ---------------------------------------------------------------------- OB History  Gravidity:    6         Prem:   1         SAB:   3  Ectopic:      1 ---------------------------------------------------------------------- Gestational Age  LMP:           32w 3d        Date:  04/13/21                  EDD:   01/18/22  U/S Today:     32w 2d  EDD:   01/19/22  Best:          Milderd Meager 3d     Det. By:  LMP  (04/13/21)          EDD:   01/18/22 ---------------------------------------------------------------------- Anatomy  Cranium:               Appears normal         LVOT:                   Previously seen  Cavum:                 Appears normal         Aortic Arch:            Previously seen  Ventricles:            Appears normal         Ductal Arch:            Not well visualized  Choroid Plexus:        Previously seen        Diaphragm:              Appears normal  Cerebellum:            Previously seen        Stomach:                Appears normal, left                                                                        sided  Posterior Fossa:       Previously seen        Abdomen:                Appears normal  Nuchal Fold:           Not applicable (>40    Abdominal Wall:         Previously seen                         wks GA)  Face:                  Orbits and profile     Cord Vessels:           Previously seen                         previously seen  Lips:                  Previously seen        Kidneys:                Appear normal  Palate:                Previously seen        Bladder:                Appears normal   Thoracic:              Appears normal         Spine:  Previously seen  Heart:                 Previously seen        Upper Extremities:      Previously seen  RVOT:                  Previously seen        Lower Extremities:      Previously seen  Other:  Female gender previously seen. VC and 3VV visualized. 5th digit, nasal          bone, feet  previously visualized. ---------------------------------------------------------------------- Cervix Uterus Adnexa  Cervix  Not visualized (advanced GA >24wks)  Uterus  No abnormality visualized.  Right Ovary  Not visualized.  Left Ovary  Not visualized. ---------------------------------------------------------------------- Impression  Fetal growth is appropriate for gestational age.  Amniotic fluid  is normal and good fetal activity seen.  Antenatal testing is  reassuring.  BPP 8/8.  Cephalic presentation.  xxxxxxxxxxxxxxxxxxxxxxxxxxxxxxxxxxxxxxxx  Consultation (see EPIC )  I had the pleasure of seeing Ms. Schloesser today at the [place]  [facility].  She is return for fetal growth  assessment and antenatal testing.  She has a new diagnosis of gestational diabetes.  Patient has  started checking her blood glucose levels and reports  increased postbreakfast levels.  Fasting levels are reportedly  within normal range.  Her previous pregnancy was complicated by gestational  diabetes that was well controlled on diet.  Patient has chronic hypertension and takes labetalol.  Blood  pressure today at her office is 134/75 mmHg.  Gestational diabetes  I explained the diagnosis of gestational diabetes.  I  emphasized the importance of good blood glucose control to  prevent adverse fetal or neonatal outcomes.  I discussed  normal range of blood glucose values. I encouraged her to  check her blood glucose regularly.  Possible complications of gestational diabetes include fetal  macrosomia, shoulder dystocia and birth injuries, stillbirth (in  poorly controlled  diabetes) and neonatal respiratory  syndrome and other complications.  In about 85% of cases, gestational diabetes is well controlled  by diet alone.  Exercise reduces the need for insulin.  Medical  treatment includes oral hypoglycemics or insulin.  Timing of delivery: Patient has chronic hypertension and  takes antihypertensives.  Since she has comorbid conditions  of diabetes and hypertension, delivery may be considered at  [redacted] weeks gestation.  Type 2 diabetes develops in up to 50% of women with GDM. I  recommend postpartum screening with 75-g glucose load at  6 to 12 weeks after delivery. ---------------------------------------------------------------------- Recommendations  -Continue weekly BPP till delivery. ----------------------------------------------------------------------                 Tama High, MD Electronically Signed Final Report   11/26/2021 05:15 pm ----------------------------------------------------------------------   Assessment and Plan:  Pregnancy: [id number] at 49w6d1. Supervision of high risk pregnancy, antepartum FHT and FH normal Desires the baby to be circumcised Plans on POP, but interested in tubal. At this point, it would be an interval tubal. She will think about it and will talk about it next appt.  2. History of pre-eclampsia in prior pregnancy, currently pregnant BP good. On ASA '81mg'$   3. Primary hypertension BP controlled on labetalol  4. Diet controlled gestational diabetes mellitus (GDM) in third trimester [place] and PP controlled  5. History of preterm delivery, currently pregnant Due to severe preeclampsia  6. Class 2 severe obesity due to excess calories with serious  comorbidity and body mass index (BMI) of 38.0 to 38.9 in adult [facility])   Preterm labor symptoms and general obstetric precautions including but not limited to vaginal bleeding, contractions, leaking of fluid and fetal movement were reviewed in detail with the patient. I discussed the  assessment and treatment plan with the patient. The patient was provided an opportunity to ask questions and all were answered. The patient agreed with the plan and demonstrated an understanding of the instructions. The patient was advised to call back or seek an in-person office evaluation/go to MAU at [facility] for any urgent or concerning symptoms. Please refer to After Visit Summary for other counseling recommendations.   I provided 11 minutes of face-to-face time during this encounter.  No follow-ups on file.  Future Appointments  Date Time Provider [place]  12/10/2021 10:15 AM [facility] NURSE [facility]  12/10/2021 10:30 AM [facility] US2 [facility]  12/17/2021 10:30 AM [facility] NURSE [facility]  12/17/2021 10:45 AM [facility] US4 [facility]  12/24/2021  3:30 PM [facility] NURSE [facility]  12/24/2021  3:45 PM [facility] US1 [facility]  12/31/2021  3:30 PM [facility] NURSE [facility]  12/31/2021  3:45 PM [facility] US4 [facility] [place] [facility] [employer], [place]

## 2021-12-06 NOTE — Progress Notes (Signed)
Patient presents for Mychart visit. Patient identified with two patient identifiers. Unable to check bp at this time. States that blood sugars has been within the range. Fasting around 90 and PP around 120 range. No other concerns.

## 2021-12-09 ENCOUNTER — Other Ambulatory Visit: Payer: Self-pay | Admitting: Obstetrics and Gynecology

## 2021-12-09 DIAGNOSIS — O10919 Unspecified pre-existing hypertension complicating pregnancy, unspecified trimester: Secondary | ICD-10-CM

## 2021-12-10 ENCOUNTER — Ambulatory Visit: Payer: Medicaid Other | Admitting: *Deleted

## 2021-12-10 ENCOUNTER — Ambulatory Visit: Payer: Medicaid Other | Attending: Obstetrics and Gynecology

## 2021-12-10 VITALS — BP 136/74 | HR 92

## 2021-12-10 DIAGNOSIS — O09213 Supervision of pregnancy with history of pre-term labor, third trimester: Secondary | ICD-10-CM

## 2021-12-10 DIAGNOSIS — O99213 Obesity complicating pregnancy, third trimester: Secondary | ICD-10-CM | POA: Diagnosis not present

## 2021-12-10 DIAGNOSIS — Z3A34 34 weeks gestation of pregnancy: Secondary | ICD-10-CM | POA: Diagnosis not present

## 2021-12-10 DIAGNOSIS — O09293 Supervision of pregnancy with other poor reproductive or obstetric history, third trimester: Secondary | ICD-10-CM

## 2021-12-10 DIAGNOSIS — O24419 Gestational diabetes mellitus in pregnancy, unspecified control: Secondary | ICD-10-CM | POA: Diagnosis not present

## 2021-12-10 DIAGNOSIS — O0990 Supervision of high risk pregnancy, unspecified, unspecified trimester: Secondary | ICD-10-CM | POA: Diagnosis not present

## 2021-12-10 DIAGNOSIS — O24410 Gestational diabetes mellitus in pregnancy, diet controlled: Secondary | ICD-10-CM | POA: Diagnosis not present

## 2021-12-10 DIAGNOSIS — O10013 Pre-existing essential hypertension complicating pregnancy, third trimester: Secondary | ICD-10-CM

## 2021-12-10 DIAGNOSIS — O10913 Unspecified pre-existing hypertension complicating pregnancy, third trimester: Secondary | ICD-10-CM | POA: Insufficient documentation

## 2021-12-10 DIAGNOSIS — E669 Obesity, unspecified: Secondary | ICD-10-CM

## 2021-12-17 ENCOUNTER — Ambulatory Visit: Payer: Medicaid Other | Admitting: *Deleted

## 2021-12-17 ENCOUNTER — Ambulatory Visit: Payer: Medicaid Other | Attending: Obstetrics and Gynecology

## 2021-12-17 VITALS — BP 132/73 | HR 89

## 2021-12-17 DIAGNOSIS — O99213 Obesity complicating pregnancy, third trimester: Secondary | ICD-10-CM | POA: Diagnosis not present

## 2021-12-17 DIAGNOSIS — O10013 Pre-existing essential hypertension complicating pregnancy, third trimester: Secondary | ICD-10-CM | POA: Diagnosis not present

## 2021-12-17 DIAGNOSIS — O09293 Supervision of pregnancy with other poor reproductive or obstetric history, third trimester: Secondary | ICD-10-CM | POA: Diagnosis not present

## 2021-12-17 DIAGNOSIS — O10913 Unspecified pre-existing hypertension complicating pregnancy, third trimester: Secondary | ICD-10-CM | POA: Diagnosis not present

## 2021-12-17 DIAGNOSIS — Z3A35 35 weeks gestation of pregnancy: Secondary | ICD-10-CM

## 2021-12-17 DIAGNOSIS — O09213 Supervision of pregnancy with history of pre-term labor, third trimester: Secondary | ICD-10-CM | POA: Diagnosis not present

## 2021-12-17 DIAGNOSIS — O24419 Gestational diabetes mellitus in pregnancy, unspecified control: Secondary | ICD-10-CM | POA: Diagnosis not present

## 2021-12-17 DIAGNOSIS — E669 Obesity, unspecified: Secondary | ICD-10-CM

## 2021-12-17 DIAGNOSIS — O0990 Supervision of high risk pregnancy, unspecified, unspecified trimester: Secondary | ICD-10-CM

## 2021-12-17 DIAGNOSIS — O24410 Gestational diabetes mellitus in pregnancy, diet controlled: Secondary | ICD-10-CM | POA: Diagnosis not present

## 2021-12-19 DIAGNOSIS — O9A213 Injury, poisoning and certain other consequences of external causes complicating pregnancy, third trimester: Secondary | ICD-10-CM | POA: Diagnosis not present

## 2021-12-19 DIAGNOSIS — Z3A35 35 weeks gestation of pregnancy: Secondary | ICD-10-CM | POA: Diagnosis not present

## 2021-12-19 DIAGNOSIS — S70361A Insect bite (nonvenomous), right thigh, initial encounter: Secondary | ICD-10-CM | POA: Diagnosis not present

## 2021-12-24 ENCOUNTER — Ambulatory Visit: Payer: Medicaid Other | Attending: Obstetrics and Gynecology

## 2021-12-24 ENCOUNTER — Ambulatory Visit: Payer: Medicaid Other | Admitting: *Deleted

## 2021-12-24 ENCOUNTER — Encounter: Payer: Self-pay | Admitting: *Deleted

## 2021-12-24 VITALS — BP 126/68 | HR 93

## 2021-12-24 DIAGNOSIS — O0990 Supervision of high risk pregnancy, unspecified, unspecified trimester: Secondary | ICD-10-CM | POA: Insufficient documentation

## 2021-12-24 DIAGNOSIS — O24410 Gestational diabetes mellitus in pregnancy, diet controlled: Secondary | ICD-10-CM

## 2021-12-24 DIAGNOSIS — O10913 Unspecified pre-existing hypertension complicating pregnancy, third trimester: Secondary | ICD-10-CM | POA: Insufficient documentation

## 2021-12-24 DIAGNOSIS — O10013 Pre-existing essential hypertension complicating pregnancy, third trimester: Secondary | ICD-10-CM

## 2021-12-24 DIAGNOSIS — O09293 Supervision of pregnancy with other poor reproductive or obstetric history, third trimester: Secondary | ICD-10-CM | POA: Diagnosis not present

## 2021-12-24 DIAGNOSIS — Z3A36 36 weeks gestation of pregnancy: Secondary | ICD-10-CM | POA: Diagnosis not present

## 2021-12-24 DIAGNOSIS — O09213 Supervision of pregnancy with history of pre-term labor, third trimester: Secondary | ICD-10-CM

## 2021-12-24 DIAGNOSIS — O99213 Obesity complicating pregnancy, third trimester: Secondary | ICD-10-CM | POA: Diagnosis not present

## 2021-12-24 DIAGNOSIS — E669 Obesity, unspecified: Secondary | ICD-10-CM | POA: Diagnosis not present

## 2021-12-24 DIAGNOSIS — O24419 Gestational diabetes mellitus in pregnancy, unspecified control: Secondary | ICD-10-CM | POA: Diagnosis not present

## 2021-12-31 ENCOUNTER — Ambulatory Visit: Payer: Medicaid Other | Admitting: *Deleted

## 2021-12-31 ENCOUNTER — Ambulatory Visit: Payer: Medicaid Other | Attending: Obstetrics and Gynecology

## 2021-12-31 ENCOUNTER — Encounter: Payer: Self-pay | Admitting: *Deleted

## 2021-12-31 ENCOUNTER — Other Ambulatory Visit (HOSPITAL_COMMUNITY)
Admission: RE | Admit: 2021-12-31 | Discharge: 2021-12-31 | Disposition: A | Discharge status: Home / Self Care | Payer: Medicaid Other | Source: Ambulatory Visit | Attending: Advanced Practice Midwife | Admitting: Advanced Practice Midwife

## 2021-12-31 ENCOUNTER — Ambulatory Visit (INDEPENDENT_AMBULATORY_CARE_PROVIDER_SITE_OTHER): Payer: Medicaid Other | Admitting: Advanced Practice Midwife

## 2021-12-31 VITALS — BP 127/78 | HR 92 | Wt 265.0 lb

## 2021-12-31 VITALS — BP 128/72 | HR 82

## 2021-12-31 DIAGNOSIS — E669 Obesity, unspecified: Secondary | ICD-10-CM | POA: Diagnosis not present

## 2021-12-31 DIAGNOSIS — O24410 Gestational diabetes mellitus in pregnancy, diet controlled: Secondary | ICD-10-CM

## 2021-12-31 DIAGNOSIS — O0990 Supervision of high risk pregnancy, unspecified, unspecified trimester: Secondary | ICD-10-CM

## 2021-12-31 DIAGNOSIS — O24419 Gestational diabetes mellitus in pregnancy, unspecified control: Secondary | ICD-10-CM | POA: Insufficient documentation

## 2021-12-31 DIAGNOSIS — O10919 Unspecified pre-existing hypertension complicating pregnancy, unspecified trimester: Secondary | ICD-10-CM

## 2021-12-31 DIAGNOSIS — O10913 Unspecified pre-existing hypertension complicating pregnancy, third trimester: Secondary | ICD-10-CM | POA: Diagnosis not present

## 2021-12-31 DIAGNOSIS — O09213 Supervision of pregnancy with history of pre-term labor, third trimester: Secondary | ICD-10-CM | POA: Diagnosis not present

## 2021-12-31 DIAGNOSIS — R102 Pelvic and perineal pain: Secondary | ICD-10-CM

## 2021-12-31 DIAGNOSIS — O09293 Supervision of pregnancy with other poor reproductive or obstetric history, third trimester: Secondary | ICD-10-CM

## 2021-12-31 DIAGNOSIS — Z3A37 37 weeks gestation of pregnancy: Secondary | ICD-10-CM | POA: Diagnosis not present

## 2021-12-31 DIAGNOSIS — O99213 Obesity complicating pregnancy, third trimester: Secondary | ICD-10-CM | POA: Diagnosis not present

## 2021-12-31 DIAGNOSIS — O10013 Pre-existing essential hypertension complicating pregnancy, third trimester: Secondary | ICD-10-CM | POA: Diagnosis not present

## 2021-12-31 DIAGNOSIS — O26893 Other specified pregnancy related conditions, third trimester: Secondary | ICD-10-CM

## 2021-12-31 NOTE — Progress Notes (Signed)
   PRENATAL VISIT NOTE  Subjective:  Alexis Ellis is a 31 y.o. [phone] at 11w3dbeing seen today for ongoing prenatal care.  She is currently monitored for the following issues for this high-risk pregnancy and has ADD (attention deficit disorder); HTN (hypertension); Anxiety; Class 2 obesity due to excess calories with body mass index (BMI) of 38.0 to 38.9 in adult; Supervision of high risk pregnancy, antepartum; Chronic hypertension affecting pregnancy; History of pre-eclampsia in prior pregnancy, currently pregnant; History of preterm delivery, currently pregnant; and Gestational diabetes on their problem list.  Patient reports  pelvic pressure .  Contractions: Irritability. Vag. Bleeding: None.  Movement: Present. Denies leaking of fluid.   The following portions of the patient's history were reviewed and updated as appropriate: allergies, current medications, past family history, past medical history, past social history, past surgical history and problem list.   Objective:   Vitals:   12/31/21 1428  BP: 127/78  Pulse: 92  Weight: 265 lb (120.2 kg)    Fetal Status: Fetal Heart Rate (bpm): 144 Fundal Height: 40 cm Movement: Present  Presentation: Vertex  General:  Alert, oriented and cooperative. Patient is in no acute distress.  Skin: Skin is warm and dry. No rash noted.   Cardiovascular: Normal heart rate noted  Respiratory: Normal respiratory effort, no problems with respiration noted  Abdomen: Soft, gravid, appropriate for gestational age.  Pain/Pressure: Present     Pelvic: Cervical exam performed in the presence of a chaperone Dilation: 2 Effacement (%): 50 Station: -3  Extremities: Normal range of motion.  Edema: Mild pitting, slight indentation  Mental Status: Normal mood and affect. Normal behavior. Normal judgment and thought content.   Assessment and Plan:  Pregnancy: [id number] 370w3d1. Supervision of high risk pregnancy, antepartum --Anticipatory guidance about  next visits/weeks of pregnancy given.  --Pt desires low intervention birth, prefers midwives.  She had 33 week delivery with severe PEC last pregnancy.  CHTN on medications this pregnancy, BPs have been well controlled.   --Desires more normal experience this time, wants to use nitrous oxide for pain management  - Culture, beta strep (group b only) - Cervicovaginal ancillary only( COTatamy 2. Diet controlled gestational diabetes mellitus (GDM) in third trimester --Per MFM, given comorbid conditions with CHTN, IOL at 38 weeks --IOL scheduled, orders placed  3. Chronic hypertension affecting pregnancy --On labetalol--well controlled  4. Pelvic pain affecting pregnancy in third trimester, antepartum    Term labor symptoms and general obstetric precautions including but not limited to vaginal bleeding, contractions, leaking of fluid and fetal movement were reviewed in detail with the patient. Please refer to After Visit Summary for other counseling recommendations.   Return for None, IOL scheduled 8/25 or 8/26.  Future Appointments  Date Time Provider [place]8/26/2023  6:45 AM [facility] SCHED ROOM [facility] None  01/09/2022  1:50 PM ErChancy MilroyMD [facility]  01/18/2022  9:35 AM ArWoodroe ModeMD [facility] None    LiFatima BlankCNM

## 2021-12-31 NOTE — Patient Instructions (Signed)
Things to Try After 37 weeks to Encourage Labor/Get Ready for Labor:    Try the Miles Circuit at www.milescircuit.com daily to improve baby's position and encourage the onset of labor.  Walk a little and rest a little every day.  Change positions often.  Cervical Ripening: May try one or both Red Raspberry Leaf capsules or tea:  two 300mg or 400mg tablets with each meal, 2-3 times a day, or 1-3 cups of tea daily  Potential Side Effects Of Raspberry Leaf:  Most women do not experience any side effects from drinking raspberry leaf tea. However, nausea and loose stools are possible   Evening Primrose Oil capsules: take 1 capsule by mouth and place one capsule in the vagina every night.    Some of the potential side effects:  Upset stomach  Loose stools or diarrhea  Headaches  Nausea  Sex can also help the cervix ripen and encourage labor onset.    Labor Precautions Reasons to come to MAU at [facility]:  1.  Contractions are  5 minutes apart or less, each last 1 minute, these have been going on for 1-2 hours, and you cannot walk or talk during them 2.  You have a large gush of fluid, or a trickle of fluid that will not stop and you have to wear a pad 3.  You have bleeding that is bright red, heavier than spotting--like menstrual bleeding (spotting can be normal in early labor or after a check of your cervix) 4.  You do not feel the baby moving like he/she normally does  

## 2022-01-01 ENCOUNTER — Telehealth (HOSPITAL_COMMUNITY): Payer: Self-pay | Admitting: *Deleted

## 2022-01-01 ENCOUNTER — Encounter (HOSPITAL_COMMUNITY): Payer: Self-pay | Admitting: *Deleted

## 2022-01-01 LAB — CERVICOVAGINAL ANCILLARY ONLY
Chlamydia: NEGATIVE
Comment: NEGATIVE
Comment: NORMAL
Neisseria Gonorrhea: NEGATIVE

## 2022-01-01 NOTE — Telephone Encounter (Signed)
Preadmission screen  

## 2022-01-02 ENCOUNTER — Inpatient Hospital Stay (HOSPITAL_COMMUNITY)
Admission: AD | Admit: 2022-01-02 | Discharge: 2022-01-05 | DRG: 787 | Disposition: A | Discharge status: Home / Self Care | Payer: Medicaid Other | Attending: Obstetrics & Gynecology | Admitting: Obstetrics & Gynecology

## 2022-01-02 ENCOUNTER — Other Ambulatory Visit: Payer: Self-pay

## 2022-01-02 ENCOUNTER — Inpatient Hospital Stay (HOSPITAL_COMMUNITY): Payer: Medicaid Other | Admitting: Anesthesiology

## 2022-01-02 ENCOUNTER — Telehealth: Payer: Self-pay | Admitting: Emergency Medicine

## 2022-01-02 ENCOUNTER — Encounter (HOSPITAL_COMMUNITY): Payer: Self-pay | Admitting: Obstetrics and Gynecology

## 2022-01-02 DIAGNOSIS — O0990 Supervision of high risk pregnancy, unspecified, unspecified trimester: Principal | ICD-10-CM

## 2022-01-02 DIAGNOSIS — O09299 Supervision of pregnancy with other poor reproductive or obstetric history, unspecified trimester: Secondary | ICD-10-CM

## 2022-01-02 DIAGNOSIS — O1002 Pre-existing essential hypertension complicating childbirth: Secondary | ICD-10-CM | POA: Diagnosis not present

## 2022-01-02 DIAGNOSIS — I10 Essential (primary) hypertension: Secondary | ICD-10-CM | POA: Diagnosis present

## 2022-01-02 DIAGNOSIS — O4202 Full-term premature rupture of membranes, onset of labor within 24 hours of rupture: Secondary | ICD-10-CM | POA: Diagnosis not present

## 2022-01-02 DIAGNOSIS — O10919 Unspecified pre-existing hypertension complicating pregnancy, unspecified trimester: Secondary | ICD-10-CM

## 2022-01-02 DIAGNOSIS — O906 Postpartum mood disturbance: Secondary | ICD-10-CM | POA: Diagnosis not present

## 2022-01-02 DIAGNOSIS — O24420 Gestational diabetes mellitus in childbirth, diet controlled: Secondary | ICD-10-CM | POA: Diagnosis present

## 2022-01-02 DIAGNOSIS — Z98891 History of uterine scar from previous surgery: Secondary | ICD-10-CM

## 2022-01-02 DIAGNOSIS — O9902 Anemia complicating childbirth: Secondary | ICD-10-CM | POA: Diagnosis present

## 2022-01-02 DIAGNOSIS — O99892 Other specified diseases and conditions complicating childbirth: Secondary | ICD-10-CM | POA: Diagnosis present

## 2022-01-02 DIAGNOSIS — Z87891 Personal history of nicotine dependence: Secondary | ICD-10-CM

## 2022-01-02 DIAGNOSIS — O4292 Full-term premature rupture of membranes, unspecified as to length of time between rupture and onset of labor: Principal | ICD-10-CM | POA: Diagnosis present

## 2022-01-02 DIAGNOSIS — F988 Other specified behavioral and emotional disorders with onset usually occurring in childhood and adolescence: Secondary | ICD-10-CM | POA: Diagnosis present

## 2022-01-02 DIAGNOSIS — Z3A37 37 weeks gestation of pregnancy: Secondary | ICD-10-CM | POA: Diagnosis not present

## 2022-01-02 DIAGNOSIS — F419 Anxiety disorder, unspecified: Secondary | ICD-10-CM

## 2022-01-02 DIAGNOSIS — O164 Unspecified maternal hypertension, complicating childbirth: Secondary | ICD-10-CM | POA: Diagnosis not present

## 2022-01-02 DIAGNOSIS — O09899 Supervision of other high risk pregnancies, unspecified trimester: Secondary | ICD-10-CM

## 2022-01-02 LAB — COMPREHENSIVE METABOLIC PANEL
ALT: 31 U/L (ref 0–44)
AST: 24 U/L (ref 15–41)
Albumin: 2.8 g/dL — ABNORMAL LOW (ref 3.5–5.0)
Alkaline Phosphatase: 76 U/L (ref 38–126)
Anion gap: 8 (ref 5–15)
BUN: 8 mg/dL (ref 6–20)
CO2: 26 mmol/L (ref 22–32)
Calcium: 9.4 mg/dL (ref 8.9–10.3)
Chloride: 105 mmol/L (ref 98–111)
Creatinine, Ser: 0.85 mg/dL (ref 0.44–1.00)
GFR, Estimated: >60 mL/min (ref >60)
Glucose, Bld: 84 mg/dL (ref 70–99)
Potassium: 4.4 mmol/L (ref 3.5–5.1)
Sodium: 139 mmol/L (ref 135–145)
Total Bilirubin: 0.5 mg/dL (ref 0.3–1.2)
Total Protein: 6.1 g/dL — ABNORMAL LOW (ref 6.5–8.1)

## 2022-01-02 LAB — CBC
HCT: 36.9 % (ref 36.0–46.0)
Hemoglobin: 12.2 g/dL (ref 12.0–15.0)
MCH: 28.2 pg (ref 26.0–34.0)
MCHC: 33.1 g/dL (ref 30.0–36.0)
MCV: 85.2 fL (ref 80.0–100.0)
Platelets: 255 10*3/uL (ref 150–400)
RBC: 4.33 MIL/uL (ref 3.87–5.11)
RDW: 13.5 % (ref 11.5–15.5)
WBC: 8.5 10*3/uL (ref 4.0–10.5)
nRBC: 0 % (ref 0.0–0.2)

## 2022-01-02 LAB — POCT FERN TEST: POCT Fern Test: POSITIVE

## 2022-01-02 LAB — TYPE AND SCREEN
ABO/RH(D): A POS
Antibody Screen: NEGATIVE

## 2022-01-02 LAB — GLUCOSE, CAPILLARY: Glucose-Capillary: 114 mg/dL — ABNORMAL HIGH (ref 70–99)

## 2022-01-02 MED ORDER — FENTANYL-BUPIVACAINE-NACL 0.5-0.125-0.9 MG/250ML-% EP SOLN
EPIDURAL | Status: AC
  Filled 2022-01-02: qty 250

## 2022-01-02 MED ORDER — MISOPROSTOL 25 MCG QUARTER TABLET: 25.0000 ug | ORAL_TABLET | Freq: Once | ORAL | Status: DC

## 2022-01-02 MED ORDER — PHENYLEPHRINE 80 MCG/ML (10ML) SYRINGE FOR IV PUSH (FOR BLOOD PRESSURE SUPPORT)
PREFILLED_SYRINGE | INTRAVENOUS | Status: AC
  Filled 2022-01-02: qty 10

## 2022-01-02 MED ORDER — FENTANYL CITRATE (PF) 100 MCG/2ML IJ SOLN
INTRAMUSCULAR | Status: AC
  Filled 2022-01-02: qty 2

## 2022-01-02 MED ORDER — FENTANYL-BUPIVACAINE-NACL 0.5-0.125-0.9 MG/250ML-% EP SOLN
12.0000 mL/h | EPIDURAL | Status: DC | PRN
  Administered 2022-01-02: 12 mL/h via EPIDURAL

## 2022-01-02 MED ORDER — EPHEDRINE 5 MG/ML INJ
10.0000 mg | INTRAVENOUS | Status: DC | PRN
Start: 2022-01-02 — End: 2022-01-03

## 2022-01-02 MED ORDER — EPHEDRINE 5 MG/ML INJ
10.0000 mg | INTRAVENOUS | Status: AC | PRN
Start: 2022-01-02 — End: 2022-01-03
  Administered 2022-01-03 (×2): 10 mg via INTRAVENOUS

## 2022-01-02 MED ORDER — AMPHETAMINE-DEXTROAMPHETAMINE 30 MG PO TABS: 15.0000 mg | ORAL_TABLET | Freq: Two times a day (BID) | ORAL | Status: DC | PRN

## 2022-01-02 MED ORDER — LIDOCAINE HCL (PF) 1 % IJ SOLN: 30.0000 mL | INTRAMUSCULAR | Status: DC | PRN

## 2022-01-02 MED ORDER — LABETALOL HCL 200 MG PO TABS: 200.0000 mg | ORAL_TABLET | Freq: Two times a day (BID) | ORAL | Status: DC

## 2022-01-02 MED ORDER — ONDANSETRON HCL 4 MG/2ML IJ SOLN: 4.0000 mg | Freq: Four times a day (QID) | INTRAMUSCULAR | Status: DC | PRN

## 2022-01-02 MED ORDER — OXYTOCIN-SODIUM CHLORIDE 30-0.9 UT/500ML-% IV SOLN
2.5000 [IU]/h | INTRAVENOUS | Status: DC
  Filled 2022-01-02: qty 500

## 2022-01-02 MED ORDER — SOD CITRATE-CITRIC ACID 500-334 MG/5ML PO SOLN
30.0000 mL | ORAL | Status: DC | PRN
  Administered 2022-01-03: 30 mL via ORAL
  Filled 2022-01-02: qty 30

## 2022-01-02 MED ORDER — OXYTOCIN BOLUS FROM INFUSION: 333.0000 mL | Freq: Once | INTRAVENOUS | Status: DC

## 2022-01-02 MED ORDER — OXYCODONE-ACETAMINOPHEN 5-325 MG PO TABS: 2.0000 | ORAL_TABLET | ORAL | Status: DC | PRN

## 2022-01-02 MED ORDER — ACETAMINOPHEN 325 MG PO TABS: 650.0000 mg | ORAL_TABLET | ORAL | Status: DC | PRN

## 2022-01-02 MED ORDER — LABETALOL HCL 200 MG PO TABS
400.0000 mg | ORAL_TABLET | Freq: Two times a day (BID) | ORAL | Status: DC
  Filled 2022-01-02: qty 2

## 2022-01-02 MED ORDER — TERBUTALINE SULFATE 1 MG/ML IJ SOLN: 0.2500 mg | Freq: Once | INTRAMUSCULAR | Status: DC | PRN

## 2022-01-02 MED ORDER — PHENYLEPHRINE 80 MCG/ML (10ML) SYRINGE FOR IV PUSH (FOR BLOOD PRESSURE SUPPORT): 80.0000 ug | PREFILLED_SYRINGE | INTRAVENOUS | Status: DC | PRN

## 2022-01-02 MED ORDER — OXYCODONE-ACETAMINOPHEN 5-325 MG PO TABS: 1.0000 | ORAL_TABLET | ORAL | Status: DC | PRN

## 2022-01-02 MED ORDER — DIPHENHYDRAMINE HCL 50 MG/ML IJ SOLN: 12.5000 mg | INTRAMUSCULAR | Status: DC | PRN

## 2022-01-02 MED ORDER — LACTATED RINGERS IV SOLN: INTRAVENOUS | Status: DC

## 2022-01-02 MED ORDER — PHENYLEPHRINE 80 MCG/ML (10ML) SYRINGE FOR IV PUSH (FOR BLOOD PRESSURE SUPPORT)
80.0000 ug | PREFILLED_SYRINGE | INTRAVENOUS | Status: AC | PRN
Start: 2022-01-02 — End: 2022-01-03
  Administered 2022-01-03 (×5): 80 ug via INTRAVENOUS
  Administered 2022-01-03: 160 ug via INTRAVENOUS

## 2022-01-02 MED ORDER — LACTATED RINGERS IV SOLN: 500.0000 mL | Freq: Once | INTRAVENOUS | Status: DC

## 2022-01-02 MED ORDER — LABETALOL HCL 200 MG PO TABS: 400.0000 mg | ORAL_TABLET | Freq: Two times a day (BID) | ORAL | Status: DC

## 2022-01-02 MED ORDER — FENTANYL CITRATE (PF) 100 MCG/2ML IJ SOLN
100.0000 ug | INTRAMUSCULAR | Status: DC | PRN
  Administered 2022-01-02: 100 ug via INTRAVENOUS

## 2022-01-02 MED ORDER — LACTATED RINGERS IV SOLN: 500.0000 mL | INTRAVENOUS | Status: DC | PRN

## 2022-01-02 MED ORDER — LABETALOL HCL 200 MG PO TABS
400.0000 mg | ORAL_TABLET | Freq: Two times a day (BID) | ORAL | Status: DC
  Administered 2022-01-02 – 2022-01-03 (×2): 400 mg via ORAL
  Filled 2022-01-02 (×2): qty 2

## 2022-01-02 NOTE — Telephone Encounter (Signed)
Attempted TC to patient x2. Attempted TC to spouse x1.  Attempted TC to mother x1.  LVM with patient to present to hospital immediately. Pt spoke with afterhours nurse with concerns for ROM with bloody show and contractions. Pt was instructed by nurse to present to hospital immediately.

## 2022-01-02 NOTE — Anesthesia Preprocedure Evaluation (Signed)
Anesthesia Evaluation  Patient identified by MRN, date of birth, ID band Patient awake    Reviewed: Allergy & Precautions, NPO status , Patient's Chart, lab work & pertinent test results  Airway Mallampati: II  TM Distance: >3 FB Neck ROM: Full    Dental no notable dental hx.    Pulmonary neg pulmonary ROS, former smoker,    Pulmonary exam normal breath sounds clear to auscultation       Cardiovascular hypertension, negative cardio ROS Normal cardiovascular exam Rhythm:Regular Rate:Normal     Neuro/Psych  Headaches, Anxiety Depression negative psych ROS   GI/Hepatic negative GI ROS, Neg liver ROS,   Endo/Other  negative endocrine ROSdiabetes  Renal/GU negative Renal ROS  negative genitourinary   Musculoskeletal negative musculoskeletal ROS (+)   Abdominal (+) + obese,   Peds negative pediatric ROS (+)  Hematology negative hematology ROS (+)   Anesthesia Other Findings   Reproductive/Obstetrics (+) Pregnancy                             Anesthesia Physical Anesthesia Plan  ASA: 3  Anesthesia Plan: Epidural   Post-op Pain Management:    Induction:   PONV Risk Score and Plan:   Airway Management Planned:   Additional Equipment:   Intra-op Plan:   Post-operative Plan:   Informed Consent:   Plan Discussed with:   Anesthesia Plan Comments:         Anesthesia Quick Evaluation

## 2022-01-02 NOTE — H&P (Signed)
Alexis Ellis is a 31 y.o. female [phone] at 72w5dpresenting for PROM at 1:30 pm on 01/02/22 with yellow fluid mixed with mucus.  She reports irregular contractions, becoming stronger since ROM. She denies h/a, epigastric pain, or visual disturbances.  Pt has CHTN on labetalol and diet controlled GDM.    Hx significant for severe PEC with previous pregnancy, IOL at 33 weeks and vaginal delivery.    [place] 36 weeks with cephalic presentation, posterior placenta, FHR 138, AFI 13.92, EFW 54%  OB History     Gravida  6   Para  1   Term      Preterm  1   AB  4   Living  1      SAB  3   IAB      Ectopic  1   Multiple  0   Live Births  1          Past Medical History:  Diagnosis Date   ADD (attention deficit disorder)    Chicken pox    Depression    Diet controlled gestational diabetes mellitus (GDM) in third trimester 03/23/2019   Ectopic pregnancy    Elevated blood pressure    Elevated cholesterol    Frequent headaches    Gestational diabetes    Gestational hypertension 04/19/2019   History of gestational diabetes in prior pregnancy, currently pregnant 07/06/2021   History of kidney stones    HLD (hyperlipidemia) 09/29/2015   Pregnancy induced hypertension    Past Surgical History:  Procedure Laterality Date   LAPAROSCOPY N/A 09/16/2012   Procedure: LAPAROSCOPY OPERATIVE;  Surgeon: JAllena Katz MD;  [place] [facility];  Service: Gynecology;  Laterality: N/A;  Evacuation Hemoperitoneum   TUBAL LIGATION  2014   UNILATERAL SALPINGECTOMY Right 09/16/2012   Procedure: UNILATERAL SALPINGECTOMY;  Surgeon: JAllena Katz MD;  [facility];  Service: Gynecology;  Laterality: Right;   WISDOM TOOTH EXTRACTION     Family History: family history includes Alcohol abuse in her paternal grandfather; Bipolar disorder in her maternal grandmother; Diabetes in her maternal grandfather; Diabetes (age of onset: 420 in her father; Fibromyalgia in her mother; Healthy  in her sister and son; Heart attack in her father; Heart disease in her paternal grandmother; Hyperlipidemia in her paternal grandmother; Hypertension in her mother; Kidney disease in her paternal grandmother; Liver disease in her paternal grandfather and paternal grandmother; Sudden death in her father. Social History:  reports that she quit smoking about 3 years ago. Her smoking use included cigarettes. She has never used smokeless tobacco. She reports that she does not currently use alcohol. She reports that she does not use drugs.     Maternal Diabetes: Yes:  Diabetes Type:  Diet controlled Genetic Screening: Normal Maternal Ultrasounds/Referrals: Normal Fetal Ultrasounds or other Referrals:  None Maternal Substance Abuse:  No Significant Maternal Medications:  Meds include: Other:  Significant Maternal Lab Results:  Other:  Number of Prenatal Visits:greater than 3 verified prenatal visits Other Comments:   GBS unknown, collected 8/21; On labetalol for CHTN  Review of Systems  Constitutional:  Negative for chills, fatigue and fever.  Eyes:  Negative for visual disturbance.  Respiratory:  Negative for shortness of breath.   Cardiovascular:  Negative for chest pain.  Gastrointestinal:  Positive for abdominal pain. Negative for vomiting.  Genitourinary:  Positive for vaginal discharge. Negative for difficulty urinating, dysuria, flank pain, pelvic pain, vaginal bleeding and vaginal pain.  Neurological:  Negative for  dizziness and headaches.  Psychiatric/Behavioral: Negative.     Maternal Medical History:  Reason for admission: Rupture of membranes and contractions.   Contractions: Onset was 1-2 hours ago.   Frequency: irregular.   Perceived severity is moderate.   Fetal activity: Perceived fetal activity is normal.   Last perceived fetal movement was within the past hour.   Prenatal complications: PIH.   Prenatal Complications - Diabetes: gestational. Diabetes is managed by diet.        Blood pressure 136/84, pulse 93, temperature 98.2 F (36.8 C), temperature source Oral, resp. rate 16, height '5\' 5"'$  (1.651 m), weight 122.3 kg, last menstrual period 04/13/2021, SpO2 97 %. Maternal Exam:  Uterine Assessment: Contraction strength is mild.  Contraction frequency is irregular.  Abdomen: Estimated fetal weight is 54% by [place] at 36 weeks.   Fetal presentation: vertex Pelvis: adequate for delivery.     Fetal Exam Fetal Monitor Review: Mode: ultrasound.   Baseline rate: 135.  Variability: moderate (6-25 bpm).   Pattern: accelerations present and no decelerations.   Fetal State Assessment: Category I - tracings are normal.   Physical Exam Vitals and nursing note reviewed.  Constitutional:      Appearance: She is well-developed.  Cardiovascular:     Rate and Rhythm: Normal rate.     Heart sounds: Normal heart sounds.  Pulmonary:     Effort: Pulmonary effort is normal.     Breath sounds: Normal breath sounds.  Abdominal:     Palpations: Abdomen is soft.  Musculoskeletal:        General: Normal range of motion.     Cervical back: Normal range of motion.  Skin:    General: Skin is warm and dry.  Neurological:     Mental Status: She is alert and oriented to person, place, and time.  Psychiatric:        Behavior: Behavior normal.        Thought Content: Thought content normal.        Judgment: Judgment normal.     Prenatal labs: ABO, Rh: --/--/PENDING (08/23 1722) Antibody: PENDING (08/23 1722) Rubella: 14.90 (02/24 1058) RPR: Non Reactive (06/29 1115)  HBsAg: Negative (02/24 1058)  HIV: Non Reactive (06/29 1115)  GBS:   Unknown, collected 12/31/21  Assessment/Plan: [id number] at [redacted]w[redacted]d admitted for PROM at 367w5dhin meconium stained fluid GBS unknown--pending results from 8/21 Desires low intervention birth  Admit to L&D Expectant management on admission, intermittent auscultation and saline lock IV Pt encouraged to ambulate, try MiMarathon Oil and/or use nipple stimulation to encourage onset of labor Augmentation with Pitocin discussed and pt agrees with plan to use Pitocin if active labor does not begin within a few hours Depo for contraception Plans to breastfeed  LiFatima BlankCNM 6:09 PM    LiFatima Blank/23/2023, 5:49 PM

## 2022-01-02 NOTE — Progress Notes (Signed)
Labor Progress Note Alexis Ellis is a 31 y.o. [phone] at 32w5dpresented for PROM S: Feeling more regular contractions  O:  BP (!) 151/95   Pulse 84   Temp 97.9 F (36.6 C) (Oral)   Resp 18   Ht '5\' 5"'$  (1.651 m)   Wt 122.3 kg   LMP 04/13/2021 (Exact Date)   SpO2 97% Comment: room air  BMI 44.88 kg/m  Intermittent ausculatation: 140 bpm, mod variability, no accels or decels  CVE: Dilation: 3.5 Effacement (%): 50 Station: -3, Ballotable Presentation: Vertex Exam by:: HNilsa Nutting RN'   A&P: 31y.o. [id number] #Labor: Progressing well. More frequent contractions #Pain: Declined epidural #FWB: Cat 1 #GBS  pending , no abx indicated until ROM >18 hours or maternal fever #GDMA1: CBGs Q4hours and then Q2hours in active labor #cHTN: Continue labetalol 400 mg BID. Asx. Labs WNL. PCR still pending. Monitor pressures   LuAlain MarionMD 10:21 PM

## 2022-01-02 NOTE — MAU Note (Signed)
Alexis Ellis is a 31 y.o. at 29w5dhere in MAU reporting: states about 2 hours ago her water broke. Started as a trickle and it kept coming. States she has seen some mucus and it seems brown. Irregular contraction. +FM  Onset of complaint: today  Pain score: 2/10  Vitals:   01/02/22 1539  BP: (!) 142/77  Pulse: 86  Resp: 16  Temp: 98.2 F (36.8 C)  SpO2: 97%     FHT:170  Lab orders placed from triage: none

## 2022-01-03 ENCOUNTER — Encounter (HOSPITAL_COMMUNITY)
Admission: AD | Disposition: A | Discharge status: Home / Self Care | Payer: Self-pay | Source: Home / Self Care | Attending: Obstetrics & Gynecology

## 2022-01-03 ENCOUNTER — Other Ambulatory Visit: Payer: Self-pay

## 2022-01-03 ENCOUNTER — Encounter (HOSPITAL_COMMUNITY): Payer: Self-pay | Admitting: Obstetrics & Gynecology

## 2022-01-03 DIAGNOSIS — Z98891 History of uterine scar from previous surgery: Secondary | ICD-10-CM

## 2022-01-03 DIAGNOSIS — Z3A37 37 weeks gestation of pregnancy: Secondary | ICD-10-CM

## 2022-01-03 DIAGNOSIS — O1002 Pre-existing essential hypertension complicating childbirth: Secondary | ICD-10-CM | POA: Diagnosis not present

## 2022-01-03 DIAGNOSIS — O43813 Placental infarction, third trimester: Secondary | ICD-10-CM | POA: Diagnosis not present

## 2022-01-03 DIAGNOSIS — O4202 Full-term premature rupture of membranes, onset of labor within 24 hours of rupture: Secondary | ICD-10-CM | POA: Diagnosis not present

## 2022-01-03 DIAGNOSIS — Z3A00 Weeks of gestation of pregnancy not specified: Secondary | ICD-10-CM | POA: Diagnosis not present

## 2022-01-03 DIAGNOSIS — O365930 Maternal care for other known or suspected poor fetal growth, third trimester, not applicable or unspecified: Secondary | ICD-10-CM | POA: Diagnosis not present

## 2022-01-03 DIAGNOSIS — O24420 Gestational diabetes mellitus in childbirth, diet controlled: Secondary | ICD-10-CM | POA: Diagnosis not present

## 2022-01-03 DIAGNOSIS — O99892 Other specified diseases and conditions complicating childbirth: Secondary | ICD-10-CM | POA: Diagnosis present

## 2022-01-03 HISTORY — DX: History of uterine scar from previous surgery: Z98.891

## 2022-01-03 LAB — CBC
HCT: 33.2 % — ABNORMAL LOW (ref 36.0–46.0)
Hemoglobin: 11.2 g/dL — ABNORMAL LOW (ref 12.0–15.0)
MCH: 27.9 pg (ref 26.0–34.0)
MCHC: 33.7 g/dL (ref 30.0–36.0)
MCV: 82.8 fL (ref 80.0–100.0)
Platelets: 231 10*3/uL (ref 150–400)
RBC: 4.01 MIL/uL (ref 3.87–5.11)
RDW: 13.4 % (ref 11.5–15.5)
WBC: 18.3 10*3/uL — ABNORMAL HIGH (ref 4.0–10.5)
nRBC: 0 % (ref 0.0–0.2)

## 2022-01-03 LAB — RPR: RPR Ser Ql: NONREACTIVE

## 2022-01-03 LAB — GLUCOSE, CAPILLARY: Glucose-Capillary: 125 mg/dL — ABNORMAL HIGH (ref 70–99)

## 2022-01-03 LAB — PROTEIN / CREATININE RATIO, URINE
Creatinine, Urine: 199 mg/dL
Protein Creatinine Ratio: 0.1 mg/mg{Cre} (ref 0.00–0.15)
Total Protein, Urine: 20 mg/dL

## 2022-01-03 SURGERY — Surgical Case
Anesthesia: Epidural

## 2022-01-03 MED ORDER — ONDANSETRON HCL 4 MG/2ML IJ SOLN
INTRAMUSCULAR | Status: AC
  Filled 2022-01-03: qty 2

## 2022-01-03 MED ORDER — DIBUCAINE (PERIANAL) 1 % EX OINT
1.0000 | TOPICAL_OINTMENT | CUTANEOUS | Status: DC | PRN
  Administered 2022-01-05: 1 via RECTAL
  Filled 2022-01-03: qty 28

## 2022-01-03 MED ORDER — SUCCINYLCHOLINE CHLORIDE 200 MG/10ML IV SOSY
PREFILLED_SYRINGE | INTRAVENOUS | Status: DC | PRN
  Administered 2022-01-03: 100 mg via INTRAVENOUS

## 2022-01-03 MED ORDER — GABAPENTIN 100 MG PO CAPS
200.0000 mg | ORAL_CAPSULE | Freq: Every day | ORAL | Status: DC
  Administered 2022-01-03 – 2022-01-04 (×2): 200 mg via ORAL
  Filled 2022-01-03 (×2): qty 2

## 2022-01-03 MED ORDER — IBUPROFEN 600 MG PO TABS: 600.0000 mg | ORAL_TABLET | Freq: Four times a day (QID) | ORAL | Status: DC

## 2022-01-03 MED ORDER — FENTANYL CITRATE (PF) 250 MCG/5ML IJ SOLN
INTRAMUSCULAR | Status: AC
  Filled 2022-01-03: qty 5

## 2022-01-03 MED ORDER — ACETAMINOPHEN 10 MG/ML IV SOLN
INTRAVENOUS | Status: DC | PRN
  Administered 2022-01-03: 1000 mg via INTRAVENOUS

## 2022-01-03 MED ORDER — PROPOFOL 10 MG/ML IV BOLUS
INTRAVENOUS | Status: AC
  Filled 2022-01-03: qty 40

## 2022-01-03 MED ORDER — AMISULPRIDE (ANTIEMETIC) 5 MG/2ML IV SOLN: 10.0000 mg | Freq: Once | INTRAVENOUS | Status: DC | PRN

## 2022-01-03 MED ORDER — DIPHENHYDRAMINE HCL 25 MG PO CAPS: 25.0000 mg | ORAL_CAPSULE | Freq: Four times a day (QID) | ORAL | Status: DC | PRN

## 2022-01-03 MED ORDER — WITCH HAZEL-GLYCERIN EX PADS
1.0000 | MEDICATED_PAD | CUTANEOUS | Status: DC | PRN
  Administered 2022-01-05: 1 via TOPICAL

## 2022-01-03 MED ORDER — SENNOSIDES-DOCUSATE SODIUM 8.6-50 MG PO TABS
2.0000 | ORAL_TABLET | Freq: Every day | ORAL | Status: DC
  Administered 2022-01-04 – 2022-01-05 (×2): 2 via ORAL
  Filled 2022-01-03 (×2): qty 2

## 2022-01-03 MED ORDER — ONDANSETRON HCL 4 MG/2ML IJ SOLN
INTRAMUSCULAR | Status: DC | PRN
  Administered 2022-01-03: 4 mg via INTRAVENOUS

## 2022-01-03 MED ORDER — CEFAZOLIN SODIUM-DEXTROSE 2-3 GM-%(50ML) IV SOLR
INTRAVENOUS | Status: DC | PRN
  Administered 2022-01-03: 3 g via INTRAVENOUS

## 2022-01-03 MED ORDER — SIMETHICONE 80 MG PO CHEW: 80.0000 mg | CHEWABLE_TABLET | ORAL | Status: DC | PRN

## 2022-01-03 MED ORDER — OXYCODONE HCL 5 MG PO TABS: 5.0000 mg | ORAL_TABLET | Freq: Once | ORAL | Status: DC | PRN

## 2022-01-03 MED ORDER — MEPERIDINE HCL 25 MG/ML IJ SOLN: 6.2500 mg | INTRAMUSCULAR | Status: DC | PRN

## 2022-01-03 MED ORDER — PHENYLEPHRINE 80 MCG/ML (10ML) SYRINGE FOR IV PUSH (FOR BLOOD PRESSURE SUPPORT)
PREFILLED_SYRINGE | INTRAVENOUS | Status: AC
  Filled 2022-01-03: qty 10

## 2022-01-03 MED ORDER — HYDROMORPHONE HCL 1 MG/ML IJ SOLN: 1.0000 mg | INTRAMUSCULAR | Status: DC | PRN

## 2022-01-03 MED ORDER — IBUPROFEN 600 MG PO TABS
600.0000 mg | ORAL_TABLET | Freq: Four times a day (QID) | ORAL | Status: DC
  Administered 2022-01-03 – 2022-01-05 (×7): 600 mg via ORAL
  Filled 2022-01-03 (×7): qty 1

## 2022-01-03 MED ORDER — OXYCODONE HCL 5 MG/5ML PO SOLN: 5.0000 mg | Freq: Once | ORAL | Status: DC | PRN

## 2022-01-03 MED ORDER — OXYCODONE HCL 5 MG PO TABS
10.0000 mg | ORAL_TABLET | Freq: Once | ORAL | Status: AC
  Administered 2022-01-03: 10 mg via ORAL
  Filled 2022-01-03: qty 2

## 2022-01-03 MED ORDER — MIDAZOLAM HCL 2 MG/2ML IJ SOLN
INTRAMUSCULAR | Status: AC
  Filled 2022-01-03: qty 2

## 2022-01-03 MED ORDER — COCONUT OIL OIL: 1.0000 | TOPICAL_OIL | Status: DC | PRN

## 2022-01-03 MED ORDER — OXYTOCIN-SODIUM CHLORIDE 30-0.9 UT/500ML-% IV SOLN
INTRAVENOUS | Status: DC | PRN
  Administered 2022-01-03: 250 mL/h via INTRAVENOUS

## 2022-01-03 MED ORDER — ACETAMINOPHEN 10 MG/ML IV SOLN
INTRAVENOUS | Status: AC
  Filled 2022-01-03: qty 100

## 2022-01-03 MED ORDER — DEXAMETHASONE SODIUM PHOSPHATE 10 MG/ML IJ SOLN
INTRAMUSCULAR | Status: DC | PRN
  Administered 2022-01-03: 10 mg via INTRAVENOUS

## 2022-01-03 MED ORDER — MIDAZOLAM HCL 2 MG/2ML IJ SOLN
INTRAMUSCULAR | Status: DC | PRN
  Administered 2022-01-03: 2 mg via INTRAVENOUS

## 2022-01-03 MED ORDER — LACTATED RINGERS IV SOLN: INTRAVENOUS | Status: DC

## 2022-01-03 MED ORDER — KETOROLAC TROMETHAMINE 30 MG/ML IJ SOLN
INTRAMUSCULAR | Status: AC
  Filled 2022-01-03: qty 1

## 2022-01-03 MED ORDER — MAGNESIUM HYDROXIDE 400 MG/5ML PO SUSP: 30.0000 mL | ORAL | Status: DC | PRN

## 2022-01-03 MED ORDER — ENOXAPARIN SODIUM 60 MG/0.6ML IJ SOSY
0.5000 mg/kg | PREFILLED_SYRINGE | INTRAMUSCULAR | Status: DC
  Administered 2022-01-03 – 2022-01-04 (×2): 60 mg via SUBCUTANEOUS
  Filled 2022-01-03 (×2): qty 0.6

## 2022-01-03 MED ORDER — LIDOCAINE HCL (PF) 1 % IJ SOLN
INTRAMUSCULAR | Status: DC | PRN
  Administered 2022-01-02: 11 mL via EPIDURAL

## 2022-01-03 MED ORDER — OXYTOCIN-SODIUM CHLORIDE 30-0.9 UT/500ML-% IV SOLN
2.5000 [IU]/h | INTRAVENOUS | Status: AC
  Administered 2022-01-03: 2.5 [IU]/h via INTRAVENOUS

## 2022-01-03 MED ORDER — ACETAMINOPHEN 500 MG PO TABS
1000.0000 mg | ORAL_TABLET | Freq: Four times a day (QID) | ORAL | Status: DC
  Administered 2022-01-03 – 2022-01-05 (×9): 1000 mg via ORAL
  Filled 2022-01-03 (×9): qty 2

## 2022-01-03 MED ORDER — KETOROLAC TROMETHAMINE 30 MG/ML IJ SOLN
30.0000 mg | Freq: Four times a day (QID) | INTRAMUSCULAR | Status: DC
  Administered 2022-01-03: 30 mg via INTRAVENOUS
  Filled 2022-01-03: qty 1

## 2022-01-03 MED ORDER — ALBUMIN HUMAN 5 % IV SOLN: INTRAVENOUS | Status: DC | PRN

## 2022-01-03 MED ORDER — OXYCODONE HCL 5 MG PO TABS
5.0000 mg | ORAL_TABLET | ORAL | Status: DC | PRN
  Administered 2022-01-03 – 2022-01-05 (×5): 10 mg via ORAL
  Administered 2022-01-05: 5 mg via ORAL
  Filled 2022-01-03 (×2): qty 2
  Filled 2022-01-03: qty 1
  Filled 2022-01-03 (×3): qty 2

## 2022-01-03 MED ORDER — MEDROXYPROGESTERONE ACETATE 150 MG/ML IM SUSP: 150.0000 mg | INTRAMUSCULAR | Status: DC | PRN

## 2022-01-03 MED ORDER — HYDROMORPHONE HCL 1 MG/ML IJ SOLN
0.2500 mg | INTRAMUSCULAR | Status: DC | PRN
  Administered 2022-01-03 (×2): 0.5 mg via INTRAVENOUS

## 2022-01-03 MED ORDER — SUCCINYLCHOLINE CHLORIDE 200 MG/10ML IV SOSY
PREFILLED_SYRINGE | INTRAVENOUS | Status: AC
  Filled 2022-01-03: qty 10

## 2022-01-03 MED ORDER — SIMETHICONE 80 MG PO CHEW
80.0000 mg | CHEWABLE_TABLET | Freq: Three times a day (TID) | ORAL | Status: DC
  Administered 2022-01-03 – 2022-01-05 (×8): 80 mg via ORAL
  Filled 2022-01-03 (×8): qty 1

## 2022-01-03 MED ORDER — PROMETHAZINE HCL 25 MG/ML IJ SOLN: 6.2500 mg | INTRAMUSCULAR | Status: DC | PRN

## 2022-01-03 MED ORDER — HYDROMORPHONE HCL 1 MG/ML IJ SOLN
INTRAMUSCULAR | Status: AC
  Filled 2022-01-03: qty 0.5

## 2022-01-03 MED ORDER — FENTANYL CITRATE (PF) 100 MCG/2ML IJ SOLN
INTRAMUSCULAR | Status: DC | PRN
Start: 2022-01-03 — End: 2022-01-03
  Administered 2022-01-03: 250 ug via INTRAVENOUS

## 2022-01-03 MED ORDER — ZOLPIDEM TARTRATE 5 MG PO TABS: 5.0000 mg | ORAL_TABLET | Freq: Every evening | ORAL | Status: DC | PRN

## 2022-01-03 MED ORDER — OXYTOCIN-SODIUM CHLORIDE 30-0.9 UT/500ML-% IV SOLN
INTRAVENOUS | Status: AC
  Filled 2022-01-03: qty 500

## 2022-01-03 MED ORDER — IBUPROFEN 600 MG PO TABS
600.0000 mg | ORAL_TABLET | Freq: Four times a day (QID) | ORAL | Status: DC | PRN
  Administered 2022-01-03: 600 mg via ORAL
  Filled 2022-01-03: qty 1

## 2022-01-03 MED ORDER — TETANUS-DIPHTH-ACELL PERTUSSIS 5-2.5-18.5 LF-MCG/0.5 IM SUSY: 0.5000 mL | PREFILLED_SYRINGE | Freq: Once | INTRAMUSCULAR | Status: DC

## 2022-01-03 MED ORDER — MENTHOL 3 MG MT LOZG: 1.0000 | LOZENGE | OROMUCOSAL | Status: DC | PRN

## 2022-01-03 MED ORDER — KETOROLAC TROMETHAMINE 30 MG/ML IJ SOLN
30.0000 mg | Freq: Once | INTRAMUSCULAR | Status: AC | PRN
  Administered 2022-01-03: 30 mg via INTRAVENOUS

## 2022-01-03 MED ORDER — PRENATAL MULTIVITAMIN CH
1.0000 | ORAL_TABLET | Freq: Every day | ORAL | Status: DC
  Administered 2022-01-03 – 2022-01-05 (×3): 1 via ORAL
  Filled 2022-01-03 (×3): qty 1

## 2022-01-03 MED ORDER — DEXAMETHASONE SODIUM PHOSPHATE 10 MG/ML IJ SOLN
INTRAMUSCULAR | Status: AC
  Filled 2022-01-03: qty 1

## 2022-01-03 MED ORDER — SODIUM CHLORIDE 0.9 % IV SOLN
INTRAVENOUS | Status: DC | PRN
  Administered 2022-01-03: 500 mg via INTRAVENOUS

## 2022-01-03 MED ORDER — PROPOFOL 10 MG/ML IV BOLUS
INTRAVENOUS | Status: DC | PRN
  Administered 2022-01-03: 200 mg via INTRAVENOUS

## 2022-01-03 SURGICAL SUPPLY — 36 items
APL SKNCLS STERI-STRIP NONHPOA (GAUZE/BANDAGES/DRESSINGS) ×1
BARRIER ADHS 3X4 INTERCEED (GAUZE/BANDAGES/DRESSINGS) IMPLANT
BENZOIN TINCTURE PRP APPL 2/3 (GAUZE/BANDAGES/DRESSINGS) IMPLANT
BRR ADH 4X3 ABS CNTRL BYND (GAUZE/BANDAGES/DRESSINGS)
CHLORAPREP W/TINT 26ML (MISCELLANEOUS) ×2 IMPLANT
CLAMP CORD UMBIL (MISCELLANEOUS) ×1 IMPLANT
CLOSURE STERI STRIP 1/2 X4 (GAUZE/BANDAGES/DRESSINGS) IMPLANT
CLOTH BEACON ORANGE TIMEOUT ST (SAFETY) ×1 IMPLANT
DRSG OPSITE POSTOP 4X10 (GAUZE/BANDAGES/DRESSINGS) ×1 IMPLANT
ELECT REM PT RETURN 9FT ADLT (ELECTROSURGICAL) ×1
ELECTRODE REM PT RTRN 9FT ADLT (ELECTROSURGICAL) ×1 IMPLANT
EXTRACTOR VACUUM KIWI (MISCELLANEOUS) IMPLANT
GAUZE SPONGE 4X4 12PLY STRL LF (GAUZE/BANDAGES/DRESSINGS) IMPLANT
GLOVE BIO SURGEON STRL SZ 6.5 (GLOVE) ×1 IMPLANT
GLOVE BIOGEL PI IND STRL 7.0 (GLOVE) ×2 IMPLANT
GLOVE BIOGEL PI INDICATOR 7.0 (GLOVE) ×2
GOWN STRL REUS W/TWL LRG LVL3 (GOWN DISPOSABLE) ×2 IMPLANT
KIT ABG SYR 3ML LUER SLIP (SYRINGE) IMPLANT
NDL HYPO 25X5/8 SAFETYGLIDE (NEEDLE) IMPLANT
NEEDLE HYPO 22GX1.5 SAFETY (NEEDLE) IMPLANT
NEEDLE HYPO 25X5/8 SAFETYGLIDE (NEEDLE) IMPLANT
NS IRRIG 1000ML POUR BTL (IV SOLUTION) ×1 IMPLANT
PACK C SECTION WH (CUSTOM PROCEDURE TRAY) ×1 IMPLANT
PAD ABD 7.5X8 STRL (GAUZE/BANDAGES/DRESSINGS) IMPLANT
PAD OB MATERNITY 4.3X12.25 (PERSONAL CARE ITEMS) ×1 IMPLANT
RETRACTOR WND ALEXIS 25 LRG (MISCELLANEOUS) IMPLANT
RTRCTR WOUND ALEXIS 25CM LRG (MISCELLANEOUS)
SUT VIC AB 0 CT1 36 (SUTURE) ×6 IMPLANT
SUT VIC AB 2-0 CT1 27 (SUTURE) ×1
SUT VIC AB 2-0 CT1 TAPERPNT 27 (SUTURE) ×1 IMPLANT
SUT VIC AB 4-0 KS 27 (SUTURE) IMPLANT
SUT VIC AB 4-0 PS2 27 (SUTURE) ×1 IMPLANT
SYR CONTROL 10ML LL (SYRINGE) IMPLANT
TOWEL OR 17X24 6PK STRL BLUE (TOWEL DISPOSABLE) ×1 IMPLANT
TRAY FOLEY W/BAG SLVR 14FR LF (SET/KITS/TRAYS/PACK) IMPLANT
WATER STERILE IRR 1000ML POUR (IV SOLUTION) ×1 IMPLANT

## 2022-01-03 NOTE — Discharge Summary (Addendum)
Postpartum Discharge Summary  Date of Service updated 01/05/22    Patient Name: Alexis Ellis DOB: 04-16-1991 MRN: [id number]  Date of admission: 01/02/2022 Delivery date:01/03/2022  Delivering provider: Woodroe Mode  Date of discharge: 01/05/2022  Admitting diagnosis: Gestational diabetes mellitus (GDM) in childbirth, diet controlled [O24.420] Delivery by emergency cesarean [O99.892] Intrauterine pregnancy: [redacted]w[redacted]d    Secondary diagnosis:  Principal Problem:   Gestational diabetes mellitus (GDM) in childbirth, diet controlled Active Problems:   ADD (attention deficit disorder)   HTN (hypertension)   History of pre-eclampsia in prior pregnancy, currently pregnant   History of preterm delivery, currently pregnant   Status post emergency cesarean section   Delivery by emergency cesarean  Additional problems: n/a    Discharge diagnosis: Term Pregnancy Delivered                                              Post partum procedures: BP Med adjustment Augmentation: N/A Complications: None  Hospital course: Onset of Labor With Unplanned C/S   31y.o. yo [id number] 346w6das admitted in Latent Labor on 01/02/2022. Patient had a labor course significant for SROM, then progression to active phase of labor. Noted fetal distress with prolonged deceleration, with high fetal station, prompting call for stat c-section. The patient went for cesarean section due to Non-Reassuring FHR. Delivery details as follows: Membrane Rupture Time/Date: 1:30 PM ,01/02/2022   Delivery Method:C-Section, Low Transverse  Details of operation can be found in separate operative note. Patient had an postpartum course complicated by postpartum blues, SW consult placed and behavioral health referral placed.  She is ambulating,tolerating a regular diet, passing flatus, and urinating well.  Patient is discharged home in stable condition 01/05/22.  Newborn Data: Birth date:01/03/2022  Birth time:12:32 AM  Gender:Female   Living status:Living  Apgars:8 ,9  Weight:2690 g   Magnesium Sulfate received: No BMZ received: No Rhophylac:N/A MMR:N/A T-DaP: declined Flu: N/A Transfusion:No  Physical exam  Vitals:   01/04/22 1440 01/04/22 2118 01/05/22 0544 01/05/22 0644  BP:  135/80 137/85 (!) 143/78  Pulse:  85 (!) 105 90  Resp: _0 Temp: 98 F (36.7 C) 98.2 F (36.8 C) 98 F (36.7 C)   TempSrc: Oral Oral Axillary   SpO2: 96% 100% 100%   Weight:      Height:       General: alert, cooperative, and no distress Lochia: appropriate Uterine Fundus: firm Incision: Healing well with no significant drainage DVT Evaluation: Calf/Ankle edema is present Labs: Lab Results  Component Value Date   WBC 18.3 (H) 01/03/2022   HGB 11.2 (L) 01/03/2022   HCT 33.2 (L) 01/03/2022   MCV 82.8 01/03/2022   PLT 231 01/03/2022      Latest Ref Rng & Units 01/02/2022    6:50 PM  CMP  Glucose 70 - 99 mg/dL 84   BUN 6 - 20 mg/dL 8   Creatinine 0.44 - 1.00 mg/dL 0.85   Sodium 135 - 145 mmol/L 139   Potassium 3.5 - 5.1 mmol/L 4.4   Chloride 98 - 111 mmol/L 105   CO2 22 - 32 mmol/L 26   Calcium 8.9 - 10.3 mg/dL 9.4   Total Protein 6.5 - 8.1 g/dL 6.1   Total Bilirubin 0.3 - 1.2 mg/dL 0.5   Alkaline Phos 38 - 126 U/L 76   AST  15 - 41 U/L 24   ALT 0 - 44 U/L 31    Edinburgh Score:    01/03/2022    3:20 PM  Edinburgh Postnatal Depression Scale Screening Tool  I have been able to laugh and see the funny side of things. 0  I have looked forward with enjoyment to things. 0  I have blamed myself unnecessarily when things went wrong. 3  I have been anxious or worried for no good reason. 2  I have felt scared or panicky for no good reason. 0  Things have been getting on top of me. 0  I have been so unhappy that I have had difficulty sleeping. 0  I have felt sad or miserable. 1  I have been so unhappy that I have been crying. 0  The thought of harming myself has occurred to me. 0  Edinburgh Postnatal  Depression Scale Total 6     After visit meds:  Allergies as of 01/05/2022   No Known Allergies      Medication List     STOP taking these medications    aspirin EC 81 MG tablet       TAKE these medications    Accu-Chek Guide test strip Generic drug: glucose blood Use 1 test strip to check glucose 4 times daily   Accu-Chek Guide w/Device Kit Use glucose meter to monitor blood sugar 4 times daily   acetaminophen 500 MG tablet Commonly known as: TYLENOL Take 2 tablets (1,000 mg total) by mouth every 6 (six) hours.   amphetamine-dextroamphetamine 30 MG tablet Commonly known as: Adderall Take 1/2 tablet by mouth four times daily. (1/2 PO QID)   Blood Pressure Kit Devi 1 Device by Does not apply route once a week.   fluticasone 50 MCG/ACT nasal spray Commonly known as: FLONASE Place 2 sprays into both nostrils daily.   hydrocortisone 2.5 % rectal cream Commonly known as: ANUSOL-HC Place rectally 2 (two) times daily.   ibuprofen 600 MG tablet Commonly known as: ADVIL Take 1 tablet (600 mg total) by mouth every 6 (six) hours.   labetalol 200 MG tablet Commonly known as: NORMODYNE TAKE 2 TABLETS BY MOUTH 2 TIMES DAILY.   Medical Compression Socks Misc 1 Device by Does not apply route as needed.   Schlater Supp Sm Misc Wear as directed.   oxyCODONE 5 MG immediate release tablet Commonly known as: Oxy IR/ROXICODONE Take 1-2 tablets (5-10 mg total) by mouth every 4 (four) hours as needed for moderate pain.   promethazine 25 MG tablet Commonly known as: PHENERGAN Take 1 tablet (25 mg total) by mouth every 6 (six) hours as needed for nausea or vomiting.   senna-docusate 8.6-50 MG tablet Commonly known as: Senokot-S Take 2 tablets by mouth daily.   Vitafol Gummies 3.33-0.333-34.8 MG Chew Chew 1 tablet by mouth daily.         Discharge home in stable condition Infant Feeding: Bottle Infant Disposition:home with mother Discharge  instruction: per After Visit Summary and Postpartum booklet. Activity: Advance as tolerated. Pelvic rest for 6 weeks.  Diet: routine diet Future Appointments: Future Appointments  Date Time Provider [place]  01/10/2022  1:20 PM [place] None  02/26/2022  9:15 AM [facility] None  02/26/2022 10:15 AM Johnston Ebbs, NP [facility] None   Follow up Visit:  Message sent to [facility] on 8/24 by Shelda Pal, DO  Please schedule this patient for a In person postpartum visit in 6 weeks  with the following provider: Any provider. Additional Postpartum F/U:2 hour GTT, Incision check 1 week, and BP check 1 week . Referral placed to [facility] for postpartum blues. High risk pregnancy complicated by: GDM and HTN Delivery mode:  C-Section, Low Transverse  Anticipated Birth Control:  Depo   01/05/2022  Q Mercado-Ortiz, DO    

## 2022-01-03 NOTE — Op Note (Signed)
Alexis Ellis PROCEDURE DATE: 01/03/2022  PREOPERATIVE DIAGNOSES: Intrauterine pregnancy at 40w6dweeks gestation; non-reassuring fetal status  POSTOPERATIVE DIAGNOSES: The same, viable infant delivered  PROCEDURE: Primary Low Transverse Cesarean Section  SURGEON:  Dr. JEmeterio Reeve ASSISTANT:  JShelda Pal DO An experienced assistant was required given the standard of surgical care given the complexity of the case.  This assistant was needed for exposure, dissection, suctioning, retraction, instrument exchange, assisting with delivery with administration of fundal pressure, and for overall help during the procedure.  ANESTHESIOLOGY TEAM: Anesthesiologist: MLynda Rainwater MD CRNA: MAlvy Bimler CRNA; TSammie Bench CRNA  INDICATIONS: Alexis RILESis a 31y.o. [id number] [id number] for cesarean section secondary to the indications listed under preoperative diagnoses; please see preoperative note for further details.  The risks of surgery were discussed with the patient including but were not limited to: bleeding which may require transfusion or reoperation; infection which may require antibiotics; injury to bowel, bladder, ureters or other surrounding organs; injury to the fetus; need for additional procedures including hysterectomy in the event of a life-threatening hemorrhage; formation of adhesions; placental abnormalities wth subsequent pregnancies; incisional problems; thromboembolic phenomenon and other postoperative/anesthesia complications.  The patient concurred with the proposed plan, giving informed written consent for the procedure.    FINDINGS:  Viable female infant in cephalic presentation.  Apgars 8 and 9.  Amniotic fluid: clear.  Intact placenta, three vessel cord.  Normal uterus, fallopian tubes and ovaries bilaterally.  ANESTHESIA: general INTRAVENOUS FLUIDS: 1450 ml   ESTIMATED BLOOD LOSS: 653 ml URINE OUTPUT:  100 ml SPECIMENS: Placenta sent  to pathology and venous cord gas collected COMPLICATIONS: None immediate  PROCEDURE IN DETAIL:  The patient preoperatively received intravenous antibiotics and had sequential compression devices applied to her lower extremities.  She was then taken to the operating room where  she was placed under general anesthesia . She was then placed in a dorsal supine position with a leftward tilt, and prepped and draped in a sterile manner.  A foley catheter was  placed into her bladder and attached to constant gravity.  After an adequate timeout was performed, a Pfannenstiel skin incision was made with scalpel and carried through to the underlying layer of fascia. The fascia was incised in the midline, and this incision was extended bluntly. The rectus muscles were separated in the midline and the peritoneum was entered bluntly.   The Alexis self-retaining retractor was introduced into the abdominal cavity.  Attention was turned to the lower uterine segment where a low transverse hysterotomy was made with a scalpel and extended bluntly in caudad and cephalad directions.  The infant was successfully delivered, the cord was clamped and cut after immediately, and the infant was handed over to the awaiting neonatology team. Uterine massage was then administered, and the placenta delivered intact with a three-vessel cord. The uterus was then cleared of clots and debris.  The hysterotomy was closed with 0 Vicryl in a running fashion.  A second imbricating layer was placed with 0-vicryl in running fashion.  The pelvis was cleared of all clot and debris. Hemostasis was confirmed on all surfaces. The uterus was once again inspected and noted to be hemostatic. The retractor was removed.  The peritoneum was closed with a 2-0 Vicryl running stitch. The fascia was then closed using 0 Vicryl in a running fashion.  The subcutaneous layer was irrigated, and was found to be hemostatic and was reapproximated with 2-0 plain gut  in a  running fashion. The skin was closed with a 4-0 monocryl subcuticular stitch. The patient tolerated the procedure well. Sponge, instrument and needle counts were correct x 3.  She was taken to the recovery room in stable condition.    Shelda Pal, [place] Fellow, Faculty practice [place] for [facility] 01/03/22  1:31 AM

## 2022-01-03 NOTE — Anesthesia Procedure Notes (Addendum)
Epidural Patient location during procedure: OB Start time: 01/02/2022 11:47 PM End time: 01/03/2022 12:03 AM  Staffing Anesthesiologist: Lynda Rainwater, MD Performed: anesthesiologist   Preanesthetic Checklist Completed: patient identified, IV checked, site marked, risks and benefits discussed, surgical consent, monitors and equipment checked, pre-op evaluation and timeout performed  Epidural Patient position: sitting Prep: ChloraPrep Patient monitoring: heart rate, cardiac monitor, continuous pulse ox and blood pressure Approach: midline Location: L2-L3 Injection technique: LOR saline  Needle:  Needle type: Tuohy  Needle gauge: 17 G Needle length: 9 cm Needle insertion depth: 7 cm Catheter type: closed end flexible Catheter size: 20 Guage Catheter at skin depth: 11 cm Test dose: negative  Assessment Events: blood not aspirated, injection not painful, no injection resistance, no paresthesia and negative IV test  Additional Notes Reason for block:procedure for pain

## 2022-01-03 NOTE — Lactation Note (Signed)
This note was copied from a baby's chart. Lactation Consultation Note  Patient Name: Alexis Ellis XBMWU'X Date: 01/03/2022 Reason for consult: Follow-up assessment;Mother's request;Difficult latch;Early term 37-38.6wks Age:33 hours P2, ETI female infant. Birth Parent feeding choice is breast and donor breast milk. Birth Parent used hand pump and expressed 2 mls  colostrum that was sitting on the counter less than 1 hour prior to [facility] entering the room. LC changed void and stool while in room, infant had 3 stools since birth. Infant was spitty twice with LC, green thick mucous after having 2 mls of EBM by spoon, infant was suction and burped.. Birth Parent was concern about infant being spitty LC mention to suction and burp infant if it continues and call RN, if needed. LC verbalized she understood Birth Parent concerns with infant being spitty..  Infant was not interested in latching at the breast, Birth Parent attempted to  latch infant , infant was sleepy  only held breast  in mouth.  Birth Parent was doing STS and attempting to offer few mls of donor breast milk with infant using slow flow bottle nipple when LC left the room.. Birth Parent was set up with DEBP, see below tools, Birth Parent will pump every 3 hours for 15 minutes on initial setting to help stimulate supply and establish milk supply.  Birth Parent know to call [place] for further latch assistance if needed, continue try latch infant first , every 8 to 12+ times within 24 hours, STS and if infant doesn't latch to offer any EBM 1st and then donor breast milk.  Maternal Data    Feeding Mother's Current Feeding Choice: Breast Milk and Donor Milk  LATCH Score Latch: Too sleepy or reluctant, no latch achieved, no sucking elicited.  Audible Swallowing: None  Type of Nipple: Everted at rest and after stimulation  Comfort (Breast/Nipple): Soft / non-tender  Hold (Positioning): Assistance needed to correctly position infant at  breast and maintain latch.  LATCH Score: 5   Lactation Tools Discussed/Used Tools: Pump Breast pump type: Double-Electric Breast Pump Pump Education: Setup, frequency, and cleaning;Milk Storage Reason for Pumping: Infant is spitty and not latching at the breast. Pumping frequency: Birth Parent will pump every 3 hours for 15 minutes on inital setting.  Interventions Interventions: Breast feeding basics reviewed;Assisted with latch;Skin to skin;Breast compression;Adjust position;Support pillows;Position options;Expressed milk;DEBP;Education;LC Services brochure  Discharge    Consult Status Consult Status: Follow-up Date: 01/04/22 Follow-up type: In-patient    Vicente Serene 01/03/2022, 7:33 PM

## 2022-01-03 NOTE — Progress Notes (Signed)
Alexis Ellis is a 31 y.o. [phone] at 87w6dby LMP admitted for rupture of membranes  Subjective:   Objective: BP 130/85   Pulse (!) 107   Temp 97.6 F (36.4 C) (Oral)   Resp (!) 22   Ht '5\' 5"'$  (1.651 m)   Wt 122.3 kg   LMP 04/13/2021 (Exact Date)   SpO2 97% Comment: room air  BMI 44.88 kg/m  No intake/output data recorded. Total I/O In: 16295[I.V.:1200; IV Piggyback:350] Out: 703 [Urine:50; Blood:653]  FHT:  FHR: 60-80 bpm, variability: moderate,  accelerations:  Abscent,  decelerations:  Present   UC:   regular, every 2 minutes SVE:   Dilation: 7 Effacement (%): 90 Station: 0 Exam by:: C Barneycastle RN Patient was complete, 0 station and had prolonged deceleration to the 60-80 that did not respond to position and O2. There was not enough descent with pushing to attempt vacuum so she was counseled that CS was indicated emergently and she was consented verbally Labs: Lab Results  Component Value Date   WBC 8.5 01/02/2022   HGB 12.2 01/02/2022   HCT 36.9 01/02/2022   MCV 85.2 01/02/2022   PLT 255 01/02/2022    Assessment / Plan: Fetal bradycardia, taken for cesarean section, see op note    JEmeterio Reeve MD 01/03/2022, 1:20 AM

## 2022-01-03 NOTE — Plan of Care (Signed)
  Problem: Education: Goal: Knowledge of General Education information will improve Description: Including pain rating scale, medication(s)/side effects and non-pharmacologic comfort measures Outcome: Completed/Met   Problem: Health Behavior/Discharge Planning: Goal: Ability to manage health-related needs will improve Outcome: Completed/Met   Problem: Clinical Measurements: Goal: Ability to maintain clinical measurements within normal limits will improve Outcome: Completed/Met Goal: Will remain free from infection Outcome: Completed/Met Goal: Diagnostic test results will improve Outcome: Completed/Met Goal: Respiratory complications will improve Outcome: Completed/Met Goal: Cardiovascular complication will be avoided Outcome: Completed/Met   Problem: Activity: Goal: Risk for activity intolerance will decrease Outcome: Completed/Met   Problem: Nutrition: Goal: Adequate nutrition will be maintained Outcome: Completed/Met   Problem: Coping: Goal: Level of anxiety will decrease Outcome: Completed/Met   Problem: Elimination: Goal: Will not experience complications related to bowel motility Outcome: Completed/Met Goal: Will not experience complications related to urinary retention Outcome: Completed/Met   Problem: Pain Managment: Goal: General experience of comfort will improve Outcome: Completed/Met   Problem: Safety: Goal: Ability to remain free from injury will improve Outcome: Completed/Met   Problem: Skin Integrity: Goal: Risk for impaired skin integrity will decrease Outcome: Completed/Met   Problem: Education: Goal: Knowledge of Childbirth will improve Outcome: Completed/Met Goal: Ability to make informed decisions regarding treatment and plan of care will improve Outcome: Completed/Met Goal: Ability to state and carry out methods to decrease the pain will improve Outcome: Completed/Met Goal: Individualized Educational Video(s) Outcome: Completed/Met    Problem: Coping: Goal: Ability to verbalize concerns and feelings about labor and delivery will improve Outcome: Completed/Met   Problem: Life Cycle: Goal: Ability to make normal progression through stages of labor will improve Outcome: Completed/Met Goal: Ability to effectively push during vaginal delivery will improve Outcome: Completed/Met   Problem: Role Relationship: Goal: Will demonstrate positive interactions with the child Outcome: Completed/Met   Problem: Safety: Goal: Risk of complications during labor and delivery will decrease Outcome: Completed/Met   Problem: Pain Management: Goal: Relief or control of pain from uterine contractions will improve Outcome: Completed/Met   Problem: Education: Goal: Knowledge of disease or condition will improve Outcome: Completed/Met Goal: Knowledge of the prescribed therapeutic regimen will improve Outcome: Completed/Met   Problem: Fluid Volume: Goal: Peripheral tissue perfusion will improve Outcome: Completed/Met   Problem: Clinical Measurements: Goal: Complications related to disease process, condition or treatment will be avoided or minimized Outcome: Completed/Met   

## 2022-01-03 NOTE — Progress Notes (Signed)
Patient ID: Alexis Ellis, female   DOB: 1990/06/09, 31 y.o.   MRN: [id number]   CTSP at [place]; arrived in room to see pt post-epidural placement and in R lateral positioning with difficult tracing FHR. Despite recent epidural, BPs were 151/108 and 153/91; exam done revealing cx C/C/vtx 0 and IFSE placed for FHR between 70-90s; Dr Roselie Awkward called for eval of possibly vacuum vs C/S (pt pushed twice with her first baby who was 33wks); pushed with 2 ctx while he was present and examining her, and then stat C/S was called due to minimal fetal descent with vtx too high for operative vag delivery.  Myrtis Ser CNM 01/03/2022 12:28 AM

## 2022-01-03 NOTE — Transfer of Care (Signed)
Immediate Anesthesia Transfer of Care Note  Patient: Alexis Ellis  Procedure(s) Performed: CESAREAN SECTION  Patient Location: PACU  Anesthesia Type:General  Level of Consciousness: awake, alert , oriented and patient cooperative  Airway & Oxygen Therapy: Patient Spontanous Breathing and Patient connected to face mask oxygen  Post-op Assessment: Report given to RN and Post -op Vital signs reviewed and stable  Post vital signs: Reviewed and stable  Last Vitals:  Vitals Value Taken Time  BP 127/68 01/03/22 0130  Temp 36.1 C 01/03/22 0130  Pulse 108 01/03/22 0137  Resp 16 01/03/22 0137  SpO2 100 % 01/03/22 0137  Vitals shown include unvalidated device data.  Last Pain:  Vitals:   01/02/22 2250  TempSrc: Oral  PainSc:          Complications: No notable events documented.

## 2022-01-03 NOTE — Progress Notes (Signed)
Was called into room by patient's use of call light. Pt was crying in pain, guarding her mid to lower abdomen at her incision site. I called the L&D first call OB to inform about higher range BP's and was given parameters for BP of 160/105 to call if pt reached. Dr. Dorena Cookey also entered a one time dosage of Oxy '10mg'$ . Pt was given meds and pain assessed in 1hr.

## 2022-01-03 NOTE — Anesthesia Postprocedure Evaluation (Signed)
Anesthesia Post Note  Patient: Alexis Ellis  Procedure(s) Performed: CESAREAN SECTION     Patient location during evaluation: PACU Anesthesia Type: Epidural Level of consciousness: awake and alert Pain management: pain level controlled Vital Signs Assessment: post-procedure vital signs reviewed and stable Respiratory status: spontaneous breathing, nonlabored ventilation and respiratory function stable Cardiovascular status: blood pressure returned to baseline and stable Postop Assessment: no apparent nausea or vomiting Anesthetic complications: no   No notable events documented.  Last Vitals:  Vitals:   01/03/22 0345 01/03/22 0445  BP: (!) 142/79 (!) 144/81  Pulse: 88 90  Resp: 18 18  Temp: 36.8 C 36.8 C  SpO2: 100% 100%    Last Pain:  Vitals:   01/03/22 0445  TempSrc: Oral  PainSc: 9    Pain Goal:                   Lynda Rainwater

## 2022-01-03 NOTE — Lactation Note (Signed)
This note was copied from a baby's chart. Lactation Consultation Note  Patient Name: Alexis Ellis NIDPO'E Date: 01/03/2022 Reason for consult: Initial assessment;Early term 37-38.6wks;1st time breastfeeding Age:33 hours  P2: Early term infant at 37+6 weeks Feeding preference: Breast  MD requested latch assistance.  "[place]" beginning to awaken as I arrived.  Offered to assist with latching; birth parent receptive.  Taught hand expression; one drop observed.  Attempted to latch, however, "[place]" showed no interest in initiating a suck.  Placed him STS on birth parent's chest and he fell asleep.  Encouraged lots of STS, breast massage and hand expression today.  Suggested birth parent call her [place] for assistance as needed.  Reviewed breast feeding basics.  Demonstrated finger feeding when colostrum drops visible.  Continue to monitor "[place]" and if not latching/feeding it would be adviisable to initiate pumping, however, not essential at this time.  Birth parent satisfied to do lots of STS stating she missed this with her first son who was a preterm baby and in the NICU.  Support person at bedside. RN updated.    Maternal Data Has patient been taught Hand Expression?: Yes Does the patient have breastfeeding experience prior to this delivery?: No (First child was in NICU; birth parent attempted but not successful)  Feeding Mother's Current Feeding Choice: Breast Milk  LATCH Score Latch: Too sleepy or reluctant, no latch achieved, no sucking elicited.  Audible Swallowing: None  Type of Nipple: Everted at rest and after stimulation (Short shafted)  Comfort (Breast/Nipple): Soft / non-tender  Hold (Positioning): Assistance needed to correctly position infant at breast and maintain latch.  LATCH Score: 5   Lactation Tools Discussed/Used    Interventions Interventions: Breast feeding basics reviewed;Assisted with latch;Skin to skin;Breast massage;Hand express;Position  options;Support pillows;Adjust position;Education;LC Services brochure  Discharge Pump: Personal (Medela Pump in Style)  Consult Status Consult Status: Follow-up Date: 01/04/22 Follow-up type: In-patient    Little Ishikawa 01/03/2022, 9:27 AM

## 2022-01-03 NOTE — Anesthesia Procedure Notes (Signed)
Procedure Name: Intubation Date/Time: 01/03/2022 12:26 AM  Performed by: Lynda Rainwater, MDPre-anesthesia Checklist: Patient identified, Emergency Drugs available, Suction available and Patient being monitored Patient Re-evaluated:Patient Re-evaluated prior to induction Oxygen Delivery Method: Circle System Utilized Preoxygenation: Pre-oxygenation with 100% oxygen Induction Type: IV induction Ventilation: Mask ventilation without difficulty Laryngoscope Size: Miller and 3 Grade View: Grade II Tube type: Oral Tube size: 7.0 mm Number of attempts: 1 Airway Equipment and Method: Stylet and Oral airway Placement Confirmation: ETT inserted through vocal cords under direct vision, positive ETCO2 and breath sounds checked- equal and bilateral Secured at: 21 cm Tube secured with: Tape Dental Injury: Teeth and Oropharynx as per pre-operative assessment

## 2022-01-04 LAB — GLUCOSE, CAPILLARY: Glucose-Capillary: 106 mg/dL — ABNORMAL HIGH (ref 70–99)

## 2022-01-04 LAB — CULTURE, BETA STREP (GROUP B ONLY): Strep Gp B Culture: NEGATIVE

## 2022-01-04 LAB — SURGICAL PATHOLOGY

## 2022-01-04 NOTE — Progress Notes (Signed)
POSTPARTUM PROGRESS NOTE  POD #1  Subjective:  Alexis Ellis is a 31 y.o. [id number] s/p pLTCS at [redacted]w[redacted]d No acute events overnight. She reports she is doing well, physically, although has been struggling with emotional coping of her unexpected c-section delivery. She denies any problems with ambulating, voiding or po intake. Denies nausea or vomiting. She has passed flatus. Pain is well controlled.  Lochia is adequate.  Objective: Blood pressure (!) 112/55, pulse 76, temperature 97.6 F (36.4 C), temperature source Oral, resp. rate 17, height '5\' 5"'$  (1.651 m), weight 122.3 kg, last menstrual period 04/13/2021, SpO2 99 %, unknown if currently breastfeeding.  Physical Exam:  General: alert, cooperative and no distress Chest: no respiratory distress Heart: regular rate, distal pulses intact Uterine Fundus: firm, appropriately tender DVT Evaluation: No calf swelling or tenderness Extremities: mild edema Skin: warm, dry; incision clean/dry/intact w/ honeycomb dressing in place  Recent Labs    01/02/22 1850 01/03/22 0323  HGB 12.2 11.2*  HCT 36.9 33.2*    Assessment/Plan: Alexis BLOWEis a 31y.o. [id number]/p pLTCS at 368w6dor fetal bradycardia.  POD#1 - Doing welll; pain is controlled. H/H appropriate  Routine postpartum care  OOB, ambulated  Lovenox for VTE prophylaxis Anemia: asymptomatic Postpartum Blues: Pt desires to talk to behavioral health outpatient. Declines HI/SI. Contraception: POPs Feeding: Breast, bottle  Dispo: Plan for discharge tomorrow.   LOS: 2 days   JeShelda PalDO OB Fellow  01/04/2022, 8:19 AM

## 2022-01-04 NOTE — Progress Notes (Signed)
Pt has been tearful on multiple occasions through out shift.  RN entered [facility] for support/resources.  PT asking questions about discharge, states " I have life going on outside of here." Support person not present. Pt states FOB will come tonight.

## 2022-01-04 NOTE — Progress Notes (Signed)
CSW met with MOB in room 524 to complete an assessment for "Pt very anxious, SLP came out of room stating MOB was crying and stating she's very stressed." CSW explained CSW's and purpose for CSW's visits.  MOB declined information about PMADs and stated, "I was just emotional because I had some stuff going on." MOB declined to talk about "stuff."  MOB assured CSW that she is aware of PMADs and she feels comfortable seeking help if help is needed. CSW assessed for safety and MOB denied SI, HI, an DV.  There are no barriers to discharge.   Laurey Arrow, MSW, LCSW Clinical Social Work [phone]

## 2022-01-04 NOTE — Lactation Note (Signed)
This note was copied from a baby's chart. Lactation Consultation Note  Patient Name: Alexis Ellis IFOYD'X Date: 01/04/2022 Reason for consult: Mother's request;Difficult latch;Follow-up assessment;Early term 73-38.6wks See Alexis Ellis MR: 1DM, CHTN, C/S past hx of 1st child in NICU.  Age:33 hours P2, ETI female infant -3% weight loss hx of being spitty that is resolving, infant had low volume of intake and poor feeder.  Infant was seen by SLP and Alexis Ellis will continue to follow SLP feeding guidelines and recommendations. Per Alexis Ellis, recently pumped 6 mls of EBM that was placed in fridge, Alexis Ellis plans to continue to pump every 3 hours for 15 minutes on initial setting and offer infant any EBM 1st after Alexis Ellis latches infant at the breast. LC assisted Alexis Ellis in giving infant Donor Breast milk, infant was pace feed in side - lying position with chin support, infant consumed 15 mls of donor breast milk within 15 minutes. Alexis Ellis feeding plan: 1- Continue  to BF infant by cues, on demand, 8 + times within 24 hrs, STS, if infant appears to tire after 5 to 10 minutes  to stop BF and supplement infant with any EBM first and then donor breast milk. 2- Alexis Ellis knows to call RN/ LC if their are any BF questions, concerns or if latch assistance is needed. 3-Continue to supplement infant with EBM/Donor milk 15 mls or more if infant desires within 1st 24-48 hours of life.   Maternal Data    Feeding Mother's Current Feeding Choice: Breast Milk and Donor Milk Nipple Type: Dr. Myra Gianotti Preemie  LATCH Score                    Lactation Tools Discussed/Used    Interventions Interventions: Skin to skin;Education;Pace feeding  Discharge    Consult Status Date: 01/05/22 Follow-up type: In-patient    Vicente Serene 01/04/2022, 6:05 PM

## 2022-01-05 ENCOUNTER — Inpatient Hospital Stay (HOSPITAL_COMMUNITY): Admission: AD | Admit: 2022-01-05 | Payer: Medicaid Other | Source: Home / Self Care

## 2022-01-05 ENCOUNTER — Inpatient Hospital Stay (HOSPITAL_COMMUNITY): Payer: Medicaid Other

## 2022-01-05 MED ORDER — LABETALOL HCL 200 MG PO TABS
400.0000 mg | ORAL_TABLET | Freq: Two times a day (BID) | ORAL | Status: DC
  Filled 2022-01-05: qty 2

## 2022-01-05 MED ORDER — IBUPROFEN 600 MG PO TABS: 600.0000 mg | ORAL_TABLET | Freq: Four times a day (QID) | ORAL | 0 refills | Status: DC

## 2022-01-05 MED ORDER — OXYCODONE HCL 5 MG PO TABS: 5.0000 mg | ORAL_TABLET | ORAL | 0 refills | Status: DC | PRN

## 2022-01-05 MED ORDER — SENNOSIDES-DOCUSATE SODIUM 8.6-50 MG PO TABS: 2.0000 | ORAL_TABLET | Freq: Every day | ORAL | 0 refills | Status: DC

## 2022-01-05 MED ORDER — FUROSEMIDE 20 MG PO TABS: 20.0000 mg | ORAL_TABLET | Freq: Every day | ORAL | 0 refills | Status: DC

## 2022-01-05 MED ORDER — LABETALOL HCL 200 MG PO TABS
200.0000 mg | ORAL_TABLET | Freq: Two times a day (BID) | ORAL | Status: DC
  Administered 2022-01-05: 200 mg via ORAL

## 2022-01-05 MED ORDER — LABETALOL HCL 200 MG PO TABS: 400.0000 mg | ORAL_TABLET | Freq: Every day | ORAL | Status: DC

## 2022-01-05 MED ORDER — ACETAMINOPHEN 500 MG PO TABS: 1000.0000 mg | ORAL_TABLET | Freq: Four times a day (QID) | ORAL | 0 refills | Status: DC

## 2022-01-05 MED ORDER — LABETALOL HCL 200 MG PO TABS
200.0000 mg | ORAL_TABLET | Freq: Two times a day (BID) | ORAL | Status: DC
  Filled 2022-01-05: qty 1

## 2022-01-05 NOTE — Progress Notes (Signed)
Patient BP 128/68, Dr. Berna Spare orders to hold Labetalol '400mg'$  at the moment.

## 2022-01-05 NOTE — Lactation Note (Signed)
This note was copied from a baby's chart. Lactation Consultation Note  Patient Name: Alexis Ellis YHOOI'L Date: 01/05/2022 Reason for consult: Follow-up assessment;Early term 37-38.6wks;Infant < 6lbs Age:33 hours  P2, Infant [redacted]w[redacted]d  Weight loss 5.39%. After birth baby was able to sustain latch for 13 min.  Since baby has not latched or has tired easily.  Mother has been pumping q 3 hours with volume of 3-5 ml.  She is supplementing with donor milk and mother's milk. Infant sleeping on mother's chest after circumcision and recent SLP feeding of 5 ml with Dr. BSaul Fordycebottle.Prior to circ.  baby was able to take 15 ml.. Discussed paced feed.  Reviewed engorgement care and monitoring voids/stools.  Parents have volume goal guidelines.   Plan: Continue STS as much as possible  Offer breast when baby cues.  If he is not latching, offer supplement first then offer breast if still cueing.  Limit breastfeeding sessions to 10- 15 mins so not to overtire baby.  Pump both breasts 15-20 minutes q 3-4 hours for a total of 8 sessions per day.   Feeding supplement volume goal today 20-30 ml more if desired.  Increase volume per day of life.  [facility]   Feeding Mother's Current Feeding Choice: Breast Milk and Donor Milk  Lactation Tools Discussed/Used Tools: Pump;Flanges Flange Size: 24 Breast pump type: Double-Electric Breast Pump Pump Education: Milk Storage Reason for Pumping: stimulation and supplementation Pumping frequency: q 3 hours  Interventions Interventions: Education;Pace feeding;DEBP  Discharge Discharge Education: Engorgement and breast care;Outpatient recommendation;Warning signs for feeding baby Pump: Personal;DEBP (Medela)  Consult Status Consult Status: Complete Date: 01/05/22    Alexis Ellis [facility]8/26/2023, 1:39 PM

## 2022-01-09 ENCOUNTER — Encounter: Payer: Medicaid Other | Admitting: Obstetrics and Gynecology

## 2022-01-10 ENCOUNTER — Ambulatory Visit: Payer: Medicaid Other

## 2022-01-11 ENCOUNTER — Telehealth (HOSPITAL_COMMUNITY): Payer: Self-pay | Admitting: *Deleted

## 2022-01-11 NOTE — Telephone Encounter (Signed)
Left phone voicemail message.  Odis Hollingshead, RN 01-11-2022 at 10:04am

## 2022-01-15 ENCOUNTER — Ambulatory Visit: Payer: Medicaid Other | Admitting: Licensed Clinical Social Worker

## 2022-01-15 ENCOUNTER — Ambulatory Visit (INDEPENDENT_AMBULATORY_CARE_PROVIDER_SITE_OTHER): Payer: Medicaid Other | Admitting: General Practice

## 2022-01-15 ENCOUNTER — Ambulatory Visit (INDEPENDENT_AMBULATORY_CARE_PROVIDER_SITE_OTHER): Payer: Medicaid Other | Admitting: Licensed Clinical Social Worker

## 2022-01-15 ENCOUNTER — Encounter: Payer: Medicaid Other | Admitting: Licensed Clinical Social Worker

## 2022-01-15 DIAGNOSIS — O0990 Supervision of high risk pregnancy, unspecified, unspecified trimester: Secondary | ICD-10-CM

## 2022-01-15 DIAGNOSIS — F418 Other specified anxiety disorders: Secondary | ICD-10-CM

## 2022-01-15 DIAGNOSIS — I10 Essential (primary) hypertension: Secondary | ICD-10-CM

## 2022-01-15 DIAGNOSIS — Z98891 History of uterine scar from previous surgery: Secondary | ICD-10-CM

## 2022-01-15 NOTE — Progress Notes (Signed)
Subjective:  Alexis Ellis is a 31 y.o. female here for BP check.   Hypertension ROS: taking medications as instructed, no medication side effects noted, no TIA's, no chest pain on exertion, no dyspnea on exertion, and noting swelling of ankles.   Pt c/o swelling in the feet, jaundice, headaches and visual changes since delivery.  Objective:  LMP 04/13/2021 (Exact Date)   Appearance alert, well appearing, and in no distress. General exam BP noted to be well controlled today in office.    Assessment:   Blood Pressure stable.   Plan:  Current treatment plan is effective, no change in therapy..    The wound is cleansed, debrided of foreign material as much as possible, and dressed. The patient is alerted to watch for any signs of infection (redness, pus, pain, increased swelling or fever) and call if such occurs. Home wound care instructions are provided. Tetanus vaccination status reviewed: last tetanus booster within 10 years.   Honeycomb dressing removed, steri strips in place on left half of wound. Mild odor noticed upon dressing removal but np signs for infection noticed. Pt advised that odor could be from dressing being in place for almost 2 weeks. Advised to shower and left area air out. Signs for infection reviewed with patient and advice to call the office if she experiences any of these symptoms.

## 2022-01-16 ENCOUNTER — Other Ambulatory Visit: Payer: Self-pay | Admitting: Obstetrics and Gynecology

## 2022-01-16 DIAGNOSIS — O10919 Unspecified pre-existing hypertension complicating pregnancy, unspecified trimester: Secondary | ICD-10-CM

## 2022-01-16 NOTE — BH Specialist Note (Signed)
Integrated Behavioral Health via Telemedicine Visit  01/16/2022 Alexis Ellis [id number]  Number of Integrated Behavioral Health Clinician visits: 1 Session Start time:  1:00pm Session End time: 1:19pm Total time in minutes: 19 mins via phone per interruption with pt wifi  Referring Provider: Hospital Discharge Patient/Family location: Home [facility] Provider location: [place] All persons participating in visit: Pt Alexis Ellis and LCSW A. Taeja Debellis Types of Service: Individual psychotherapy  I connected with Alexis Ellis and/or Alexis Ellis's n/a via  Telephone or Geologist, engineering  (Video is Tree surgeon) and verified that I am speaking with the correct person using two identifiers. Discussed confidentiality: Yes   I discussed the limitations of telemedicine and the availability of in person appointments.  Discussed there is a possibility of technology failure and discussed alternative modes of communication if that failure occurs.  I discussed that engaging in this telemedicine visit, they consent to the provision of behavioral healthcare and the services will be billed under their insurance.  Patient and/or legal guardian expressed understanding and consented to Telemedicine visit: Yes   Presenting Concerns: Patient and/or family reports the following symptoms/concerns: postpartum anxiety Duration of problem: approx 3 weeks; Severity of problem: mild  Patient and/or Family's Strengths/Protective Factors: Concrete supports in place (healthy food, safe environments, etc.)  Goals Addressed: Patient will:  Reduce symptoms of: anxiety   Increase knowledge and/or ability of: coping skills   Demonstrate ability to: Increase healthy adjustment to current life circumstances  Progress towards Goals: Ongoing  Interventions: Interventions utilized:  Supportive Counseling Standardized Assessments completed: Alexis Ellis Postnatal Depression  Patient  and/or Family Response:   Assessment: Patient currently experiencing postpartum anxiety. Alexis Ellis reports difficulty processing events leading up to newborn delivery. Alexis Ellis reports delivery of her newborn was traumatic.   Patient may benefit from integrated behavioral health.  Plan: Follow up with behavioral health clinician on : as needed  Behavioral recommendations: reframe thoughts of delivery, grounding techniques and journal writing  Referral(s): [place] (In Clinic)  I discussed the assessment and treatment plan with the patient and/or parent/guardian. They were provided an opportunity to ask questions and all were answered. They agreed with the plan and demonstrated an understanding of the instructions.   They were advised to call back or seek an in-person evaluation if the symptoms worsen or if the condition fails to improve as anticipated.  Lynnea Ferrier, LCSW

## 2022-01-18 ENCOUNTER — Encounter: Payer: Medicaid Other | Admitting: Obstetrics & Gynecology

## 2022-02-20 ENCOUNTER — Other Ambulatory Visit: Payer: Self-pay | Admitting: Obstetrics and Gynecology

## 2022-02-20 DIAGNOSIS — O10919 Unspecified pre-existing hypertension complicating pregnancy, unspecified trimester: Secondary | ICD-10-CM

## 2022-02-26 ENCOUNTER — Ambulatory Visit (INDEPENDENT_AMBULATORY_CARE_PROVIDER_SITE_OTHER): Payer: Medicaid Other | Admitting: Student

## 2022-02-26 ENCOUNTER — Encounter: Payer: Self-pay | Admitting: Student

## 2022-02-26 ENCOUNTER — Other Ambulatory Visit: Payer: Medicaid Other

## 2022-02-26 DIAGNOSIS — Z3009 Encounter for other general counseling and advice on contraception: Secondary | ICD-10-CM

## 2022-02-26 MED ORDER — NORETHIN ACE-ETH ESTRAD-FE 1-20 MG-MCG PO TABS: 1.0000 | ORAL_TABLET | Freq: Every day | ORAL | 3 refills | Status: DC

## 2022-02-26 NOTE — Progress Notes (Signed)
[facility] Partum Visit Note  Alexis Ellis is a 31 y.o. [phone] female who presents for a postpartum visit. She is 7 weeks postpartum following a LTCS.  I have fully reviewed the prenatal and intrapartum course. The delivery was at 38w6dgestational weeks.  Anesthesia: general. Postpartum course has been good. Baby is doing well. Baby is feeding by both breast and bottle - Similac Sensitive RS. Bleeding no bleeding. Bowel function is normal. Bladder function is normal. Patient is sexually active. Contraception method is none. Postpartum depression screening: negative.  Patient desires contraceptive pill. No current complaints or concerns.   The pregnancy intention screening data noted above was reviewed. Potential methods of contraception were discussed. The patient elected to proceed with No data recorded.   Edinburgh Postnatal Depression Scale - 02/26/22 1012       Edinburgh Postnatal Depression Scale:  In the Past 7 Days   I have been able to laugh and see the funny side of things. 0    I have looked forward with enjoyment to things. 0    I have blamed myself unnecessarily when things went wrong. 0    I have been anxious or worried for no good reason. 2    I have felt scared or panicky for no good reason. 2    Things have been getting on top of me. 2    I have been so unhappy that I have had difficulty sleeping. 0    I have felt sad or miserable. 0    I have been so unhappy that I have been crying. 0    The thought of harming myself has occurred to me. 0    Edinburgh Postnatal Depression Scale Total 6             Health Maintenance Due  Topic Date Due   COVID-19 Vaccine (1) Never done   INFLUENZA VACCINE  12/11/2021    The following portions of the patient's history were reviewed and updated as appropriate: allergies, current medications, past family history, past medical history, past social history, past surgical history, and problem list.  Review of Systems Pertinent  items are noted in HPI.  Objective:  LMP 04/13/2021 (Exact Date)    General:  alert, cooperative, and appears stated age   Breasts:  Normal, lactating   Lungs: clear to auscultation bilaterally  Heart:  regular rate and rhythm, S1, S2 normal, no murmur, click, rub or gallop  Abdomen: soft, non-tender; bowel sounds normal; no masses,  no organomegaly   Wound well approximated incision, CDI  GU exam:   deferred       Assessment:   1. Routine postpartum follow-up   2. Counseling for birth control, oral contraceptives   Plan:   Essential components of care per ACOG recommendations:  1.  Mood and well being: Patient with negative depression screening today. Reviewed local resources for support. Encouragement and affirmations provided to patient. - Patient tobacco use? No.   - hx of drug use? No.    2. Infant care and feeding:  -Patient currently breastmilk feeding? Yes. Reviewed importance of draining breast regularly to support lactation. Baby is desiring breast and bottle. Reinforced the benefits of pumping to support milk supply.  -Social determinants of health (SDOH) reviewed in EPIC. No concerns.   3. Sexuality, contraception and birth spacing - Patient does not want a pregnancy in the next year.  Desired family size is 2 children.  - Reviewed reproductive life planning. Reviewed contraceptive  methods based on pt preferences and effectiveness.  Patient desired Oral Contraceptive today.  Discussed the risks and benefits associated with COC and breastfeeding. Encouraged patient to reach out if any additional concerns present. Advised patient to take additional at home pregnancy test in 1-2 weeks to ensure pregnancy status.   4. Sleep and fatigue -Encouraged family/partner/community support of 4 hrs of uninterrupted sleep to help with mood and fatigue  5. Physical Recovery  - Discussed patients delivery and complications. She describes her labor as good. - Patient had a  C-section emergent. Discussed support for wound healing and minimizing scar tissue. Patient expressed understanding.  - Patient has urinary incontinence? No. - Patient is safe to resume physical and sexual activity.   6.  Health Maintenance - HM due items addressed Yes - Last pap smear  Diagnosis  Date Value Ref Range Status  04/20/2021      - Negative for intraepithelial lesion or malignancy (NILM)   Pap smear not done at today's visit.  -Breast Cancer screening indicated? No.   7. Chronic Disease/Pregnancy Condition follow up: Hypertension and Gestational Diabetes- Gtt, CBC, CMP collected today. BP normal today and at home checks. Patient is taking labetalol daily. Precautions discussed. - PCP follow up  Johnston Ebbs, NP [facility], [place]

## 2022-02-26 NOTE — Addendum Note (Signed)
Addended by: Marcell Anger D on: 02/26/2022 11:25 AM   Modules accepted: Orders

## 2022-02-27 LAB — CBC
Hematocrit: 38.1 % (ref 34.0–46.6)
Hemoglobin: 12.1 g/dL (ref 11.1–15.9)
MCH: 24.4 pg — ABNORMAL LOW (ref 26.6–33.0)
MCHC: 31.8 g/dL (ref 31.5–35.7)
MCV: 77 fL — ABNORMAL LOW (ref 79–97)
Platelets: 307 10*3/uL (ref 150–450)
RBC: 4.96 x10E6/uL (ref 3.77–5.28)
RDW: 13.8 % (ref 11.7–15.4)
WBC: 7.4 10*3/uL (ref 3.4–10.8)

## 2022-02-27 LAB — COMPREHENSIVE METABOLIC PANEL
ALT: 38 IU/L — ABNORMAL HIGH (ref 0–32)
AST: 23 IU/L (ref 0–40)
Albumin/Globulin Ratio: 2 (ref 1.2–2.2)
Albumin: 4.5 g/dL (ref 3.9–4.9)
Alkaline Phosphatase: 93 IU/L (ref 44–121)
BUN/Creatinine Ratio: 14 (ref 9–23)
BUN: 12 mg/dL (ref 6–20)
Bilirubin Total: 0.5 mg/dL (ref 0.0–1.2)
CO2: 23 mmol/L (ref 20–29)
Calcium: 9.4 mg/dL (ref 8.7–10.2)
Chloride: 101 mmol/L (ref 96–106)
Creatinine, Ser: 0.84 mg/dL (ref 0.57–1.00)
Globulin, Total: 2.3 g/dL (ref 1.5–4.5)
Glucose: 127 mg/dL — ABNORMAL HIGH (ref 70–99)
Potassium: 4.7 mmol/L (ref 3.5–5.2)
Sodium: 139 mmol/L (ref 134–144)
Total Protein: 6.8 g/dL (ref 6.0–8.5)
eGFR: 94 mL/min/{1.73_m2} (ref >59)

## 2022-02-27 LAB — GLUCOSE TOLERANCE, 2 HOURS
Glucose, 2 hour: 127 mg/dL (ref 70–139)
Glucose, GTT - Fasting: 82 mg/dL (ref 70–99)

## 2022-03-08 ENCOUNTER — Ambulatory Visit: Payer: Medicaid Other | Admitting: Internal Medicine

## 2022-03-08 NOTE — Progress Notes (Deleted)
Subjective:    Patient ID: Alexis Ellis, female    DOB: 1990-10-08, 31 y.o.   MRN: [id number]  HPI  Patient presents to the clinic today with complaint of generalized joint pain.  This is mainly located in her neck and shoulders.  She also wants to review her 2-hour postpartum glucose test that she had on 10, 17/2023 which was normal.  Her last A1c was 5.1%, 06/2021.  Review of Systems     Past Medical History:  Diagnosis Date   ADD (attention deficit disorder)    Chicken pox    Depression    Diet controlled gestational diabetes mellitus (GDM) in third trimester 03/23/2019   Ectopic pregnancy    Elevated blood pressure    Elevated cholesterol    Frequent headaches    Gestational diabetes    Gestational hypertension 04/19/2019   History of gestational diabetes in prior pregnancy, currently pregnant 07/06/2021   History of kidney stones    HLD (hyperlipidemia) 09/29/2015   Pregnancy induced hypertension    Status post emergency cesarean section 01/03/2022    Current Outpatient Medications  Medication Sig Dispense Refill   acetaminophen (TYLENOL) 500 MG tablet Take 2 tablets (1,000 mg total) by mouth every 6 (six) hours. 30 tablet 0   amphetamine-dextroamphetamine (ADDERALL) 30 MG tablet 1/2 PO QID 60 tablet 0   Blood Glucose Monitoring Suppl (ACCU-CHEK GUIDE) w/Device KIT Use glucose meter to monitor blood sugar 4 times daily 1 kit 0   Blood Pressure Monitoring (BLOOD PRESSURE KIT) DEVI 1 Device by Does not apply route once a week. 1 each 0   Elastic Bandages & Supports (COMFORT FIT MATERNITY SUPP SM) MISC Wear as directed. (Patient not taking: Reported on 01/15/2022) 1 each 0   Elastic Bandages & Supports (MEDICAL COMPRESSION SOCKS) MISC 1 Device by Does not apply route as needed. (Patient not taking: Reported on 01/15/2022) 1 each 0   fluticasone (FLONASE) 50 MCG/ACT nasal spray Place 2 sprays into both nostrils daily. 16 g 6   furosemide (LASIX) 20 MG tablet Take 1 tablet  (20 mg total) by mouth daily for 5 days. 5 tablet 0   glucose blood (ACCU-CHEK GUIDE) test strip Use 1 test strip to check glucose 4 times daily 100 each 6   hydrocortisone (ANUSOL-HC) 2.5 % rectal cream Place rectally 2 (two) times daily. 30 g 2   ibuprofen (ADVIL) 600 MG tablet Take 1 tablet (600 mg total) by mouth every 6 (six) hours. 30 tablet 0   labetalol (NORMODYNE) 200 MG tablet TAKE 2 TABLETS BY MOUTH TWICE A DAY 120 tablet 1   norethindrone-ethinyl estradiol-FE (LOESTRIN FE) 1-20 MG-MCG tablet Take 1 tablet by mouth daily. 84 tablet 3   oxyCODONE (OXY IR/ROXICODONE) 5 MG immediate release tablet Take 1-2 tablets (5-10 mg total) by mouth every 4 (four) hours as needed for moderate pain. 30 tablet 0   Prenatal Vit-Fe Phos-FA-Omega (VITAFOL GUMMIES) 3.33-0.333-34.8 MG CHEW Chew 1 tablet by mouth daily. 90 tablet 5   promethazine (PHENERGAN) 25 MG tablet Take 1 tablet (25 mg total) by mouth every 6 (six) hours as needed for nausea or vomiting. (Patient not taking: Reported on 01/15/2022) 30 tablet 1   senna-docusate (SENOKOT-S) 8.6-50 MG tablet Take 2 tablets by mouth daily. (Patient not taking: Reported on 01/15/2022) 30 tablet 0   No current facility-administered medications for this visit.    No Known Allergies  Family History  Problem Relation Age of Onset   Hypertension Mother  Living   Fibromyalgia Mother    Diabetes Father 52       Deceased   Heart attack Father    Sudden death Father    Bipolar disorder Maternal Grandmother    Diabetes Maternal Grandfather    Alcohol abuse Paternal Grandfather    Liver disease Paternal Grandfather    Hyperlipidemia Paternal Grandmother    Kidney disease Paternal Grandmother    Liver disease Paternal Grandmother    Heart disease Paternal Grandmother    Healthy Sister    Healthy Son     Social History   Socioeconomic History   Marital status: Single    Spouse name: Not on file   Number of children: Not on file   Years of  education: Not on file   Highest education level: Not on file  Occupational History   Not on file  Tobacco Use   Smoking status: Former    Packs/day: 0.00    Types: Cigarettes    Quit date: 2020    Years since quitting: 3.8   Smokeless tobacco: Never  Vaping Use   Vaping Use: Never used  Substance and Sexual Activity   Alcohol use: Not Currently    Comment: ocassional   Drug use: No   Sexual activity: Not Currently    Partners: Male  Other Topics Concern   Not on file  Social History Narrative   Not on file   Social Determinants of Health   Financial Resource Strain: Not on file  Food Insecurity: Not on file  Transportation Needs: Not on file  Physical Activity: Not on file  Stress: Not on file  Social Connections: Not on file  Intimate Partner Violence: Not on file     Constitutional: Denies fever, malaise, fatigue, headache or abrupt weight changes.  HEENT: Denies eye pain, eye redness, ear pain, ringing in the ears, wax buildup, runny nose, nasal congestion, bloody nose, or sore throat. Respiratory: Denies difficulty breathing, shortness of breath, cough or sputum production.   Cardiovascular: Denies chest pain, chest tightness, palpitations or swelling in the hands or feet.  Gastrointestinal: Denies abdominal pain, bloating, constipation, diarrhea or blood in the stool.  GU: Denies urgency, frequency, pain with urination, burning sensation, blood in urine, odor or discharge. Musculoskeletal: Patient reports neck and shoulder pain.  Denies decrease in range of motion, difficulty with gait, or joint swelling.  Skin: Denies redness, rashes, lesions or ulcercations.  Neurological: Denies dizziness, difficulty with memory, difficulty with speech or problems with balance and coordination.  Psych: Denies anxiety, depression, SI/HI.  No other specific complaints in a complete review of systems (except as listed in HPI above).  Objective:   Physical Exam  There were no  vitals taken for this visit. Wt Readings from Last 3 Encounters:  01/15/22 241 lb 12.8 oz (109.7 kg)  01/02/22 269 lb 11.2 oz (122.3 kg)  12/31/21 265 lb (120.2 kg)    General: Appears their stated age, well developed, well nourished in NAD. Skin: Warm, dry and intact. No rashes, lesions or ulcerations noted. HEENT: Head: normal shape and size; Eyes: sclera white, no icterus, conjunctiva pink, PERRLA and EOMs intact; Ears: Tm's gray and intact, normal light reflex; Nose: mucosa pink and moist, septum midline; Throat/Mouth: Teeth present, mucosa pink and moist, no exudate, lesions or ulcerations noted.  Neck:  Neck supple, trachea midline. No masses, lumps or thyromegaly present.  Cardiovascular: Normal rate and rhythm. S1,S2 noted.  No murmur, rubs or gallops noted. No JVD or  BLE edema. No carotid bruits noted. Pulmonary/Chest: Normal effort and positive vesicular breath sounds. No respiratory distress. No wheezes, rales or ronchi noted.  Abdomen: Soft and nontender. Normal bowel sounds. No distention or masses noted. Liver, spleen and kidneys non palpable. Musculoskeletal: Normal range of motion. No signs of joint swelling. No difficulty with gait.  Neurological: Alert and oriented. Cranial nerves II-XII grossly intact. Coordination normal.  Psychiatric: Mood and affect normal. Behavior is normal. Judgment and thought content normal.    BMET    Component Value Date/Time   NA 139 02/26/2022 1133   K 4.7 02/26/2022 1133   CL 101 02/26/2022 1133   CO2 23 02/26/2022 1133   GLUCOSE 127 (H) 02/26/2022 1133   GLUCOSE 84 01/02/2022 1850   BUN 12 02/26/2022 1133   CREATININE 0.84 02/26/2022 1133   CREATININE 0.80 03/21/2021 1031   CALCIUM 9.4 02/26/2022 1133   GFRNONAA >60 01/02/2022 1850   GFRAA >60 05/01/2019 1723    Lipid Panel     Component Value Date/Time   CHOL 224 (H) 03/21/2021 1031   TRIG 167 (H) 03/21/2021 1031   HDL 43 (L) 03/21/2021 1031   CHOLHDL 5.2 (H) 03/21/2021 1031    VLDL 26.8 03/27/2020 1227   LDLCALC 151 (H) 03/21/2021 1031    CBC    Component Value Date/Time   WBC 7.4 02/26/2022 1133   WBC 18.3 (H) 01/03/2022 0323   RBC 4.96 02/26/2022 1133   RBC 4.01 01/03/2022 0323   HGB 12.1 02/26/2022 1133   HCT 38.1 02/26/2022 1133   PLT 307 02/26/2022 1133   MCV 77 (L) 02/26/2022 1133   MCH 24.4 (L) 02/26/2022 1133   MCH 27.9 01/03/2022 0323   MCHC 31.8 02/26/2022 1133   MCHC 33.7 01/03/2022 0323   RDW 13.8 02/26/2022 1133   LYMPHSABS 2.4 07/06/2021 1058   MONOABS 0.5 12/26/2020 1426   EOSABS 0.1 07/06/2021 1058   BASOSABS 0.1 07/06/2021 1058    Hgb A1C Lab Results  Component Value Date   HGBA1C 5.1 07/06/2021            Assessment & Plan:    RTC in 1 month for your annual exam Webb Silversmith, NP

## 2022-03-08 NOTE — Progress Notes (Deleted)
Subjective:    Patient ID: Alexis Ellis, female    DOB: 11/24/1990, 31 y.o.   MRN: [id number]  HPI  Patient presents to clinic today with complaint of joint pain, mainly in her neck and shoulders.  She also wants to go over her 2-hour postpartum glucose test which was normal on 02/26/2022.  Her last A1c was 5.1% 06/2021.  Review of Systems     Past Medical History:  Diagnosis Date   ADD (attention deficit disorder)    Chicken pox    Depression    Diet controlled gestational diabetes mellitus (GDM) in third trimester 03/23/2019   Ectopic pregnancy    Elevated blood pressure    Elevated cholesterol    Frequent headaches    Gestational diabetes    Gestational hypertension 04/19/2019   History of gestational diabetes in prior pregnancy, currently pregnant 07/06/2021   History of kidney stones    HLD (hyperlipidemia) 09/29/2015   Pregnancy induced hypertension    Status post emergency cesarean section 01/03/2022    Current Outpatient Medications  Medication Sig Dispense Refill   acetaminophen (TYLENOL) 500 MG tablet Take 2 tablets (1,000 mg total) by mouth every 6 (six) hours. 30 tablet 0   amphetamine-dextroamphetamine (ADDERALL) 30 MG tablet 1/2 PO QID 60 tablet 0   Blood Glucose Monitoring Suppl (ACCU-CHEK GUIDE) w/Device KIT Use glucose meter to monitor blood sugar 4 times daily 1 kit 0   Blood Pressure Monitoring (BLOOD PRESSURE KIT) DEVI 1 Device by Does not apply route once a week. 1 each 0   Elastic Bandages & Supports (COMFORT FIT MATERNITY SUPP SM) MISC Wear as directed. (Patient not taking: Reported on 01/15/2022) 1 each 0   Elastic Bandages & Supports (MEDICAL COMPRESSION SOCKS) MISC 1 Device by Does not apply route as needed. (Patient not taking: Reported on 01/15/2022) 1 each 0   fluticasone (FLONASE) 50 MCG/ACT nasal spray Place 2 sprays into both nostrils daily. 16 g 6   furosemide (LASIX) 20 MG tablet Take 1 tablet (20 mg total) by mouth daily for 5 days. 5 tablet  0   glucose blood (ACCU-CHEK GUIDE) test strip Use 1 test strip to check glucose 4 times daily 100 each 6   hydrocortisone (ANUSOL-HC) 2.5 % rectal cream Place rectally 2 (two) times daily. 30 g 2   ibuprofen (ADVIL) 600 MG tablet Take 1 tablet (600 mg total) by mouth every 6 (six) hours. 30 tablet 0   labetalol (NORMODYNE) 200 MG tablet TAKE 2 TABLETS BY MOUTH TWICE A DAY 120 tablet 1   norethindrone-ethinyl estradiol-FE (LOESTRIN FE) 1-20 MG-MCG tablet Take 1 tablet by mouth daily. 84 tablet 3   oxyCODONE (OXY IR/ROXICODONE) 5 MG immediate release tablet Take 1-2 tablets (5-10 mg total) by mouth every 4 (four) hours as needed for moderate pain. 30 tablet 0   Prenatal Vit-Fe Phos-FA-Omega (VITAFOL GUMMIES) 3.33-0.333-34.8 MG CHEW Chew 1 tablet by mouth daily. 90 tablet 5   promethazine (PHENERGAN) 25 MG tablet Take 1 tablet (25 mg total) by mouth every 6 (six) hours as needed for nausea or vomiting. (Patient not taking: Reported on 01/15/2022) 30 tablet 1   senna-docusate (SENOKOT-S) 8.6-50 MG tablet Take 2 tablets by mouth daily. (Patient not taking: Reported on 01/15/2022) 30 tablet 0   No current facility-administered medications for this visit.    No Known Allergies  Family History  Problem Relation Age of Onset   Hypertension Mother        Living   Fibromyalgia  Mother    Diabetes Father 38       Deceased   Heart attack Father    Sudden death Father    Bipolar disorder Maternal Grandmother    Diabetes Maternal Grandfather    Alcohol abuse Paternal Grandfather    Liver disease Paternal Grandfather    Hyperlipidemia Paternal Grandmother    Kidney disease Paternal Grandmother    Liver disease Paternal Grandmother    Heart disease Paternal Grandmother    Healthy Sister    Healthy Son     Social History   Socioeconomic History   Marital status: Single    Spouse name: Not on file   Number of children: Not on file   Years of education: Not on file   Highest education level: Not  on file  Occupational History   Not on file  Tobacco Use   Smoking status: Former    Packs/day: 0.00    Types: Cigarettes    Quit date: 2020    Years since quitting: 3.8   Smokeless tobacco: Never  Vaping Use   Vaping Use: Never used  Substance and Sexual Activity   Alcohol use: Not Currently    Comment: ocassional   Drug use: No   Sexual activity: Not Currently    Partners: Male  Other Topics Concern   Not on file  Social History Narrative   Not on file   Social Determinants of Health   Financial Resource Strain: Not on file  Food Insecurity: Not on file  Transportation Needs: Not on file  Physical Activity: Not on file  Stress: Not on file  Social Connections: Not on file  Intimate Partner Violence: Not on file     Constitutional: Denies fever, malaise, fatigue, headache or abrupt weight changes.  HEENT: Denies eye pain, eye redness, ear pain, ringing in the ears, wax buildup, runny nose, nasal congestion, bloody nose, or sore throat. Respiratory: Denies difficulty breathing, shortness of breath, cough or sputum production.   Cardiovascular: Denies chest pain, chest tightness, palpitations or swelling in the hands or feet.  Gastrointestinal: Denies abdominal pain, bloating, constipation, diarrhea or blood in the stool.  GU: Denies urgency, frequency, pain with urination, burning sensation, blood in urine, odor or discharge. Musculoskeletal: Patient reports neck and shoulder pain.  Denies decrease in range of motion, difficulty with gait, or joint swelling.  Skin: Denies redness, rashes, lesions or ulcercations.  Neurological: Denies dizziness, difficulty with memory, difficulty with speech or problems with balance and coordination.  Psych: Denies anxiety, depression, SI/HI.  No other specific complaints in a complete review of systems (except as listed in HPI above).  Objective:   Physical Exam  There were no vitals taken for this visit. Wt Readings from Last 3  Encounters:  01/15/22 241 lb 12.8 oz (109.7 kg)  01/02/22 269 lb 11.2 oz (122.3 kg)  12/31/21 265 lb (120.2 kg)    General: Appears their stated age, well developed, well nourished in NAD. Skin: Warm, dry and intact. No rashes, lesions or ulcerations noted. HEENT: Head: normal shape and size; Eyes: sclera white, no icterus, conjunctiva pink, PERRLA and EOMs intact; Ears: Tm's gray and intact, normal light reflex; Nose: mucosa pink and moist, septum midline; Throat/Mouth: Teeth present, mucosa pink and moist, no exudate, lesions or ulcerations noted.  Neck:  Neck supple, trachea midline. No masses, lumps or thyromegaly present.  Cardiovascular: Normal rate and rhythm. S1,S2 noted.  No murmur, rubs or gallops noted. No JVD or BLE edema. No carotid  bruits noted. Pulmonary/Chest: Normal effort and positive vesicular breath sounds. No respiratory distress. No wheezes, rales or ronchi noted.  Abdomen: Soft and nontender. Normal bowel sounds. No distention or masses noted. Liver, spleen and kidneys non palpable. Musculoskeletal: Normal range of motion. No signs of joint swelling. No difficulty with gait.  Neurological: Alert and oriented. Cranial nerves II-XII grossly intact. Coordination normal.  Psychiatric: Mood and affect normal. Behavior is normal. Judgment and thought content normal.    BMET    Component Value Date/Time   NA 139 02/26/2022 1133   K 4.7 02/26/2022 1133   CL 101 02/26/2022 1133   CO2 23 02/26/2022 1133   GLUCOSE 127 (H) 02/26/2022 1133   GLUCOSE 84 01/02/2022 1850   BUN 12 02/26/2022 1133   CREATININE 0.84 02/26/2022 1133   CREATININE 0.80 03/21/2021 1031   CALCIUM 9.4 02/26/2022 1133   GFRNONAA >60 01/02/2022 1850   GFRAA >60 05/01/2019 1723    Lipid Panel     Component Value Date/Time   CHOL 224 (H) 03/21/2021 1031   TRIG 167 (H) 03/21/2021 1031   HDL 43 (L) 03/21/2021 1031   CHOLHDL 5.2 (H) 03/21/2021 1031   VLDL 26.8 03/27/2020 1227   LDLCALC 151 (H)  03/21/2021 1031    CBC    Component Value Date/Time   WBC 7.4 02/26/2022 1133   WBC 18.3 (H) 01/03/2022 0323   RBC 4.96 02/26/2022 1133   RBC 4.01 01/03/2022 0323   HGB 12.1 02/26/2022 1133   HCT 38.1 02/26/2022 1133   PLT 307 02/26/2022 1133   MCV 77 (L) 02/26/2022 1133   MCH 24.4 (L) 02/26/2022 1133   MCH 27.9 01/03/2022 0323   MCHC 31.8 02/26/2022 1133   MCHC 33.7 01/03/2022 0323   RDW 13.8 02/26/2022 1133   LYMPHSABS 2.4 07/06/2021 1058   MONOABS 0.5 12/26/2020 1426   EOSABS 0.1 07/06/2021 1058   BASOSABS 0.1 07/06/2021 1058    Hgb A1C Lab Results  Component Value Date   HGBA1C 5.1 07/06/2021            Assessment & Plan:      RTC in 1 month for your annual exam Webb Silversmith, NP

## 2022-03-22 ENCOUNTER — Telehealth: Payer: Medicaid Other | Admitting: Emergency Medicine

## 2022-03-22 DIAGNOSIS — J019 Acute sinusitis, unspecified: Secondary | ICD-10-CM | POA: Diagnosis not present

## 2022-03-22 DIAGNOSIS — B9689 Other specified bacterial agents as the cause of diseases classified elsewhere: Secondary | ICD-10-CM

## 2022-03-22 MED ORDER — AMOXICILLIN-POT CLAVULANATE 875-125 MG PO TABS: 1.0000 | ORAL_TABLET | Freq: Two times a day (BID) | ORAL | 0 refills | Status: DC

## 2022-03-22 NOTE — Progress Notes (Signed)
E-Visit for Sinus Problems  We are sorry that you are not feeling well.  Here is how we plan to help!  Based on what you have shared with me it looks like you have sinusitis.  Sinusitis is inflammation and infection in the sinus cavities of the head.  Based on your presentation I believe you most likely have Acute Bacterial Sinusitis.  This is an infection caused by bacteria and is treated with antibiotics. I have prescribed Augmentin '875mg'$ /'125mg'$  one tablet twice daily with food, for 7 days.   You may use an oral decongestant such as Mucinex D or if you have glaucoma or high blood pressure use plain Mucinex.   Saline nasal spray help and can safely be used as often as needed for congestion.  Try using saline irrigation, such as with a neti pot, several times a day while you are sick. Many neti pots come with salt packets premeasured to use to make saline. If you use your own salt, make sure it is kosher salt or sea salt (don't use table salt as it has iodine in it and you don't need that in your nose). Use distilled water to make saline. If you mix your own saline using your own salt, the recipe is 1/4 teaspoon salt in 1 cup warm water. Using saline irrigation can help prevent and treat sinus infections.   If you develop worsening sinus pain, fever or notice severe headache and vision changes, or if symptoms are not better after completion of antibiotic, please schedule an appointment with a health care provider.    Sinus infections are not as easily transmitted as other respiratory infection, however we still recommend that you avoid close contact with loved ones, especially the very young and elderly.  Remember to wash your hands thoroughly throughout the day as this is the number one way to prevent the spread of infection!  Home Care: Only take medications as instructed by your medical team. Complete the entire course of an antibiotic. Do not take these medications with alcohol. A steam or  ultrasonic humidifier can help congestion.  You can place a towel over your head and breathe in the steam from hot water coming from a faucet. Avoid close contacts especially the very young and the elderly. Cover your mouth when you cough or sneeze. Always remember to wash your hands.  Get Help Right Away If: You develop worsening fever or sinus pain. You develop a severe head ache or visual changes. Your symptoms persist after you have completed your treatment plan.  Make sure you Understand these instructions. Will watch your condition. Will get help right away if you are not doing well or get worse.  Thank you for choosing an e-visit.  Your e-visit answers were reviewed by a board certified advanced clinical practitioner to complete your personal care plan. Depending upon the condition, your plan could have included both over the counter or prescription medications.  Please review your pharmacy choice. Make sure the pharmacy is open so you can pick up prescription now. If there is a problem, you may contact your provider through [employer] and have the prescription routed to another pharmacy.  Your safety is important to [place]. If you have drug allergies check your prescription carefully.   For the next 24 hours you can use MyChart to ask questions about today's visit, request a non-urgent call back, or ask for a work or school excuse. You will get an email in the next two days asking  about your experience. I hope that your e-visit has been valuable and will speed your recovery.  I have spent 5 minutes in review of e-visit questionnaire, review and updating patient chart, medical decision making and response to patient.   Willeen Cass, PhD, FNP-BC

## 2022-04-09 ENCOUNTER — Other Ambulatory Visit: Payer: Self-pay | Admitting: Obstetrics and Gynecology

## 2022-04-09 DIAGNOSIS — O10919 Unspecified pre-existing hypertension complicating pregnancy, unspecified trimester: Secondary | ICD-10-CM

## 2022-04-10 ENCOUNTER — Ambulatory Visit: Payer: Medicaid Other

## 2022-04-16 ENCOUNTER — Encounter: Payer: Self-pay | Admitting: Internal Medicine

## 2022-04-16 DIAGNOSIS — O10919 Unspecified pre-existing hypertension complicating pregnancy, unspecified trimester: Secondary | ICD-10-CM

## 2022-04-22 ENCOUNTER — Encounter: Payer: Self-pay | Admitting: Internal Medicine

## 2022-04-22 ENCOUNTER — Ambulatory Visit: Payer: Medicaid Other | Admitting: Internal Medicine

## 2022-04-22 VITALS — BP 127/66 | HR 87 | Temp 96.6°F | Wt 207.0 lb

## 2022-04-22 DIAGNOSIS — Z136 Encounter for screening for cardiovascular disorders: Secondary | ICD-10-CM

## 2022-04-22 DIAGNOSIS — E6609 Other obesity due to excess calories: Secondary | ICD-10-CM

## 2022-04-22 DIAGNOSIS — I10 Essential (primary) hypertension: Secondary | ICD-10-CM | POA: Diagnosis not present

## 2022-04-22 DIAGNOSIS — F902 Attention-deficit hyperactivity disorder, combined type: Secondary | ICD-10-CM

## 2022-04-22 DIAGNOSIS — F419 Anxiety disorder, unspecified: Secondary | ICD-10-CM

## 2022-04-22 DIAGNOSIS — Z6834 Body mass index (BMI) 34.0-34.9, adult: Secondary | ICD-10-CM

## 2022-04-22 DIAGNOSIS — M2550 Pain in unspecified joint: Secondary | ICD-10-CM | POA: Diagnosis not present

## 2022-04-22 DIAGNOSIS — Z0001 Encounter for general adult medical examination with abnormal findings: Secondary | ICD-10-CM

## 2022-04-22 DIAGNOSIS — G8929 Other chronic pain: Secondary | ICD-10-CM | POA: Diagnosis not present

## 2022-04-22 DIAGNOSIS — R7309 Other abnormal glucose: Secondary | ICD-10-CM | POA: Diagnosis not present

## 2022-04-22 MED ORDER — LISINOPRIL-HYDROCHLOROTHIAZIDE 10-12.5 MG PO TABS: 1.0000 | ORAL_TABLET | Freq: Every day | ORAL | 0 refills | Status: DC

## 2022-04-22 MED ORDER — MELOXICAM 15 MG PO TABS: 15.0000 mg | ORAL_TABLET | Freq: Every day | ORAL | 1 refills | Status: DC

## 2022-04-22 NOTE — Assessment & Plan Note (Signed)
Continue Adderall prescribed by psychiatry

## 2022-04-22 NOTE — Progress Notes (Signed)
Subjective:    Patient ID: Alexis Ellis, female    DOB: Mar 19, 1991, 31 y.o.   MRN: [id number]  HPI  Patient presents to clinic today for her annual exam.  She is also due to follow-up chronic conditions.  HTN: Her BP today is 127/66.  She is taking Labetalol as prescribed but she would like to go back on Lisinopril HCT.  ECG from 12/2020 reviewed.  Anxiety: Deteriorated due to general life stress. She takes clonazepam rarely.  She is not currently seeing a therapist but she is seeing psychiatry.  She denies depression, SI/HI.  ADD: Managed on Adderall.  She does follow with psychiatry.  Flu: 12/2018 Tetanus: 10/2021 COVID: never Pap smear: 04/2021 Dentist: as needed  Diet: She does eat meat. She consumes fruits and veggies. She tries to avoid fried foods. She drinks mostly sweet tea and water. Exercise: Walking with kids.  Review of Systems     Past Medical History:  Diagnosis Date   ADD (attention deficit disorder)    Chicken pox    Depression    Diet controlled gestational diabetes mellitus (GDM) in third trimester 03/23/2019   Ectopic pregnancy    Elevated blood pressure    Elevated cholesterol    Frequent headaches    Gestational diabetes    Gestational hypertension 04/19/2019   History of gestational diabetes in prior pregnancy, currently pregnant 07/06/2021   History of kidney stones    HLD (hyperlipidemia) 09/29/2015   Pregnancy induced hypertension    Status post emergency cesarean section 01/03/2022    Current Outpatient Medications  Medication Sig Dispense Refill   acetaminophen (TYLENOL) 500 MG tablet Take 2 tablets (1,000 mg total) by mouth every 6 (six) hours. 30 tablet 0   amoxicillin-clavulanate (AUGMENTIN) 875-125 MG tablet Take 1 tablet by mouth 2 (two) times daily. 14 tablet 0   amphetamine-dextroamphetamine (ADDERALL) 30 MG tablet 1/2 PO QID 60 tablet 0   Blood Glucose Monitoring Suppl (ACCU-CHEK GUIDE) w/Device KIT Use glucose meter to monitor  blood sugar 4 times daily 1 kit 0   Blood Pressure Monitoring (BLOOD PRESSURE KIT) DEVI 1 Device by Does not apply route once a week. 1 each 0   Elastic Bandages & Supports (COMFORT FIT MATERNITY SUPP SM) MISC Wear as directed. (Patient not taking: Reported on 01/15/2022) 1 each 0   Elastic Bandages & Supports (MEDICAL COMPRESSION SOCKS) MISC 1 Device by Does not apply route as needed. (Patient not taking: Reported on 01/15/2022) 1 each 0   fluticasone (FLONASE) 50 MCG/ACT nasal spray Place 2 sprays into both nostrils daily. 16 g 6   furosemide (LASIX) 20 MG tablet Take 1 tablet (20 mg total) by mouth daily for 5 days. 5 tablet 0   glucose blood (ACCU-CHEK GUIDE) test strip Use 1 test strip to check glucose 4 times daily 100 each 6   hydrocortisone (ANUSOL-HC) 2.5 % rectal cream Place rectally 2 (two) times daily. 30 g 2   ibuprofen (ADVIL) 600 MG tablet Take 1 tablet (600 mg total) by mouth every 6 (six) hours. 30 tablet 0   labetalol (NORMODYNE) 200 MG tablet TAKE 2 TABLETS BY MOUTH TWICE A DAY 360 tablet 1   norethindrone-ethinyl estradiol-FE (LOESTRIN FE) 1-20 MG-MCG tablet Take 1 tablet by mouth daily. 84 tablet 3   oxyCODONE (OXY IR/ROXICODONE) 5 MG immediate release tablet Take 1-2 tablets (5-10 mg total) by mouth every 4 (four) hours as needed for moderate pain. 30 tablet 0   Prenatal Vit-Fe Phos-FA-Omega (VITAFOL  GUMMIES) 3.33-0.333-34.8 MG CHEW Chew 1 tablet by mouth daily. 90 tablet 5   promethazine (PHENERGAN) 25 MG tablet Take 1 tablet (25 mg total) by mouth every 6 (six) hours as needed for nausea or vomiting. (Patient not taking: Reported on 01/15/2022) 30 tablet 1   senna-docusate (SENOKOT-S) 8.6-50 MG tablet Take 2 tablets by mouth daily. (Patient not taking: Reported on 01/15/2022) 30 tablet 0   No current facility-administered medications for this visit.    No Known Allergies  Family History  Problem Relation Age of Onset   Hypertension Mother        Living   Fibromyalgia Mother     Diabetes Father 30       Deceased   Heart attack Father    Sudden death Father    Bipolar disorder Maternal Grandmother    Diabetes Maternal Grandfather    Alcohol abuse Paternal Grandfather    Liver disease Paternal Grandfather    Hyperlipidemia Paternal Grandmother    Kidney disease Paternal Grandmother    Liver disease Paternal Grandmother    Heart disease Paternal Grandmother    Healthy Sister    Healthy Son     Social History   Socioeconomic History   Marital status: Single    Spouse name: Not on file   Number of children: Not on file   Years of education: Not on file   Highest education level: Not on file  Occupational History   Not on file  Tobacco Use   Smoking status: Former    Packs/day: 0.00    Types: Cigarettes    Quit date: 2020    Years since quitting: 3.9   Smokeless tobacco: Never  Vaping Use   Vaping Use: Never used  Substance and Sexual Activity   Alcohol use: Not Currently    Comment: ocassional   Drug use: No   Sexual activity: Not Currently    Partners: Male  Other Topics Concern   Not on file  Social History Narrative   Not on file   Social Determinants of Health   Financial Resource Strain: Not on file  Food Insecurity: Not on file  Transportation Needs: Not on file  Physical Activity: Not on file  Stress: Not on file  Social Connections: Not on file  Intimate Partner Violence: Not on file     Constitutional: Denies fever, malaise, fatigue, headache or abrupt weight changes.  HEENT: Denies eye pain, eye redness, ear pain, ringing in the ears, wax buildup, runny nose, nasal congestion, bloody nose, or sore throat. Respiratory: Denies difficulty breathing, shortness of breath, cough or sputum production.   Cardiovascular: Denies chest pain, chest tightness, palpitations or swelling in the hands or feet.  Gastrointestinal: Patient reports hemorrhoids.  Denies abdominal pain, bloating, constipation, diarrhea or blood in the stool.   GU: Denies urgency, frequency, pain with urination, burning sensation, blood in urine, odor or discharge. Musculoskeletal: Patient reports upper back pain, chronic joint pain.  Denies decrease in range of motion, difficulty with gait, or joint swelling.  Skin: Denies redness, rashes, lesions or ulcercations.  Neurological: Patient reports inattention for denies dizziness, difficulty with memory, difficulty with speech or problems with balance and coordination.  Psych: Patient has a history of anxiety.  Denies depression, SI/HI.  No other specific complaints in a complete review of systems (except as listed in HPI above).  Objective:   Physical Exam  BP 127/66 (BP Location: Left Arm, Patient Position: Sitting, Cuff Size: Normal)   Pulse 87  Temp (!) 96.6 F (35.9 C) (Temporal)   Wt 207 lb (93.9 kg)   SpO2 99%   BMI 34.45 kg/m   Wt Readings from Last 3 Encounters:  01/15/22 241 lb 12.8 oz (109.7 kg)  01/02/22 269 lb 11.2 oz (122.3 kg)  12/31/21 265 lb (120.2 kg)    General: Appears her stated age, obese in NAD. Skin: Warm, dry and intact.  HEENT: Head: normal shape and size; Eyes: sclera white, no icterus, conjunctiva pink, PERRLA and EOMs intact;  Neck:  Neck supple, trachea midline. No masses, lumps or thyromegaly present.  Cardiovascular: Normal rate and rhythm. S1,S2 noted.  No murmur, rubs or gallops noted. No JVD or BLE edema.  Pulmonary/Chest: Normal effort and positive vesicular breath sounds. No respiratory distress. No wheezes, rales or ronchi noted.  Abdomen: Normal bowel sounds.  Musculoskeletal: No bony tenderness noted over the cervical or thoracic spine.  She does have some pain with palpation of the left subscapular region.  Strength 5/5 BUE/BLE.  No difficulty with gait.  Neurological: Alert and oriented. Cranial nerves II-XII grossly intact. Coordination normal.  Psychiatric: Mood and affect normal.  Anxious appearing. Judgment and thought content normal.    BMET    Component Value Date/Time   NA 139 02/26/2022 1133   K 4.7 02/26/2022 1133   CL 101 02/26/2022 1133   CO2 23 02/26/2022 1133   GLUCOSE 127 (H) 02/26/2022 1133   GLUCOSE 84 01/02/2022 1850   BUN 12 02/26/2022 1133   CREATININE 0.84 02/26/2022 1133   CREATININE 0.80 03/21/2021 1031   CALCIUM 9.4 02/26/2022 1133   GFRNONAA >60 01/02/2022 1850   GFRAA >60 05/01/2019 1723    Lipid Panel     Component Value Date/Time   CHOL 224 (H) 03/21/2021 1031   TRIG 167 (H) 03/21/2021 1031   HDL 43 (L) 03/21/2021 1031   CHOLHDL 5.2 (H) 03/21/2021 1031   VLDL 26.8 03/27/2020 1227   LDLCALC 151 (H) 03/21/2021 1031    CBC    Component Value Date/Time   WBC 7.4 02/26/2022 1133   WBC 18.3 (H) 01/03/2022 0323   RBC 4.96 02/26/2022 1133   RBC 4.01 01/03/2022 0323   HGB 12.1 02/26/2022 1133   HCT 38.1 02/26/2022 1133   PLT 307 02/26/2022 1133   MCV 77 (L) 02/26/2022 1133   MCH 24.4 (L) 02/26/2022 1133   MCH 27.9 01/03/2022 0323   MCHC 31.8 02/26/2022 1133   MCHC 33.7 01/03/2022 0323   RDW 13.8 02/26/2022 1133   LYMPHSABS 2.4 07/06/2021 1058   MONOABS 0.5 12/26/2020 1426   EOSABS 0.1 07/06/2021 1058   BASOSABS 0.1 07/06/2021 1058    Hgb A1C Lab Results  Component Value Date   HGBA1C 5.1 07/06/2021           Assessment & Plan:   Preventative Health Maintenance:  She declines flu shot Tetanus UTD Encouraged her to get her COVID-vaccine Pap smear UTD Encouraged her to consume a balanced diet and exercise regimen Advised her to see a dentist annually We will check lipid and A1c today  RTC in 2 weeks for nurse visit BP check, 6 months, follow-up chronic conditions Webb Silversmith, NP

## 2022-04-22 NOTE — Assessment & Plan Note (Signed)
Will put her back on lisinopril HCT and discontinue labetalol now that she is no longer pregnant Reinforced DASH diet and exercise for weight loss

## 2022-04-22 NOTE — Patient Instructions (Signed)

## 2022-04-22 NOTE — Assessment & Plan Note (Signed)
Continue clonazepam prescriber psychiatry Support offered

## 2022-04-22 NOTE — Assessment & Plan Note (Signed)
Generalized Has had a negative autoimmune workup We will trial meloxicam 15 mg daily

## 2022-04-22 NOTE — Assessment & Plan Note (Signed)
Encourage diet and exercise for weight loss 

## 2022-04-23 ENCOUNTER — Encounter: Payer: Self-pay | Admitting: Internal Medicine

## 2022-04-23 DIAGNOSIS — E782 Mixed hyperlipidemia: Secondary | ICD-10-CM

## 2022-04-23 LAB — HEMOGLOBIN A1C
Hgb A1c MFr Bld: 5.5 % of total Hgb (ref <5.7)
Mean Plasma Glucose: 111 mg/dL
eAG (mmol/L): 6.2 mmol/L

## 2022-04-23 LAB — LIPID PANEL
Cholesterol: 214 mg/dL — ABNORMAL HIGH (ref <200)
HDL: 40 mg/dL — ABNORMAL LOW (ref >=50)
LDL Cholesterol (Calc): 140 mg/dL (calc) — ABNORMAL HIGH
Non-HDL Cholesterol (Calc): 174 mg/dL (calc) — ABNORMAL HIGH (ref <130)
Total CHOL/HDL Ratio: 5.4 (calc) — ABNORMAL HIGH (ref <5.0)
Triglycerides: 195 mg/dL — ABNORMAL HIGH (ref <150)

## 2022-05-14 ENCOUNTER — Other Ambulatory Visit: Payer: Self-pay | Admitting: Internal Medicine

## 2022-05-15 NOTE — Telephone Encounter (Signed)
Requested Prescriptions  Pending Prescriptions Disp Refills   lisinopril-hydrochlorothiazide (ZESTORETIC) 10-12.5 MG tablet [Pharmacy Med Name: LISINOPRIL-HCTZ 10-12.5 MG TAB] 90 tablet 1    Sig: TAKE 1 TABLET BY MOUTH EVERY DAY     Cardiovascular:  ACEI + Diuretic Combos Passed - 05/14/2022  1:31 PM      Passed - Na in normal range and within 180 days    Sodium  Date Value Ref Range Status  02/26/2022 139 134 - 144 mmol/L Final         Passed - K in normal range and within 180 days    Potassium  Date Value Ref Range Status  02/26/2022 4.7 3.5 - 5.2 mmol/L Final         Passed - Cr in normal range and within 180 days    Creat  Date Value Ref Range Status  03/21/2021 0.80 0.50 - 0.97 mg/dL Final   Creatinine, Ser  Date Value Ref Range Status  02/26/2022 0.84 0.57 - 1.00 mg/dL Final   Creatinine, Urine  Date Value Ref Range Status  01/03/2022 199 mg/dL Final         Passed - eGFR is 30 or above and within 180 days    GFR calc Af Amer  Date Value Ref Range Status  05/01/2019 >60 >60 mL/min Final   GFR, Estimated  Date Value Ref Range Status  01/02/2022 >60 >60 mL/min Final    Comment:    (NOTE) Calculated using the CKD-EPI Creatinine Equation (2021)    GFR  Date Value Ref Range Status  03/27/2020 95.38 >60.00 mL/min Final    Comment:    Calculated using the CKD-EPI Creatinine Equation (2021)   eGFR  Date Value Ref Range Status  02/26/2022 94 >59 mL/min/1.73 Final         Passed - Patient is not pregnant      Passed - Last BP in normal range    BP Readings from Last 1 Encounters:  04/22/22 127/66         Passed - Valid encounter within last 6 months    Recent Outpatient Visits           3 weeks ago Encounter for general adult medical examination with abnormal findings   [facility] [place], Coralie Keens, NP   1 year ago Impacted cerumen of right ear   [facility] [place], Coralie Keens, NP   1 year ago Encounter for general adult  medical examination with abnormal findings   [facility] [place], Coralie Keens, NP   1 year ago Lipoma of neck   [facility] [place], Coralie Keens, NP   1 year ago Primary hypertension   [facility] [place], Coralie Keens, NP

## 2022-06-22 ENCOUNTER — Telehealth: Payer: Medicaid Other | Admitting: Nurse Practitioner

## 2022-06-22 DIAGNOSIS — B3731 Acute candidiasis of vulva and vagina: Secondary | ICD-10-CM

## 2022-06-23 MED ORDER — FLUCONAZOLE 150 MG PO TABS: 150.0000 mg | ORAL_TABLET | Freq: Once | ORAL | 0 refills | Status: AC

## 2022-06-23 NOTE — Progress Notes (Signed)

## 2022-06-23 NOTE — Progress Notes (Signed)
I have spent 5 minutes in review of e-visit questionnaire, review and updating patient chart, medical decision making and response to patient.  ° °Shaleen Talamantez W Braylee Bosher, NP ° °  °

## 2022-07-15 ENCOUNTER — Ambulatory Visit: Payer: Medicaid Other | Admitting: Internal Medicine

## 2022-07-15 ENCOUNTER — Encounter: Payer: Self-pay | Admitting: Internal Medicine

## 2022-07-15 VITALS — BP 134/82 | HR 112 | Temp 96.8°F | Wt 237.0 lb

## 2022-07-15 DIAGNOSIS — R051 Acute cough: Secondary | ICD-10-CM

## 2022-07-15 DIAGNOSIS — R0981 Nasal congestion: Secondary | ICD-10-CM

## 2022-07-15 DIAGNOSIS — H9201 Otalgia, right ear: Secondary | ICD-10-CM | POA: Diagnosis not present

## 2022-07-15 DIAGNOSIS — H6121 Impacted cerumen, right ear: Secondary | ICD-10-CM

## 2022-07-15 NOTE — Progress Notes (Signed)
Subjective:    Patient ID: Alexis Ellis, female    DOB: 09-18-1990, 32 y.o.   MRN: [id number]  HPI  Patient presents to clinic today with complaint of right ear pain.  This started 2 weeks ago.  She reports associated congestion and cough.  She reports she was blowing green mucus out of her nose but this has improved.  The cough is mostly nonproductive.  She denies headache, runny nose, left ear pain, sore throat, shortness of breath, nausea, vomiting or diarrhea.  She denies fever, chills or body aches.  She has not tried anything OTC for this.  Review of Systems     Past Medical History:  Diagnosis Date   ADD (attention deficit disorder)    Chicken pox    Depression    Diet controlled gestational diabetes mellitus (GDM) in third trimester 03/23/2019   Ectopic pregnancy    Elevated blood pressure    Elevated cholesterol    Frequent headaches    Gestational diabetes    Gestational hypertension 04/19/2019   History of gestational diabetes in prior pregnancy, currently pregnant 07/06/2021   History of kidney stones    HLD (hyperlipidemia) 09/29/2015   Pregnancy induced hypertension    Status post emergency cesarean section 01/03/2022    Current Outpatient Medications  Medication Sig Dispense Refill   acetaminophen (TYLENOL) 500 MG tablet Take 2 tablets (1,000 mg total) by mouth every 6 (six) hours. 30 tablet 0   amoxicillin-clavulanate (AUGMENTIN) 875-125 MG tablet Take 1 tablet by mouth 2 (two) times daily. 14 tablet 0   amphetamine-dextroamphetamine (ADDERALL) 20 MG tablet Take 20 mg by mouth 3 (three) times daily.     clonazePAM (KLONOPIN) 1 MG tablet Take 1 mg by mouth daily as needed for anxiety.     fluticasone (FLONASE) 50 MCG/ACT nasal spray Place 2 sprays into both nostrils daily. 16 g 6   furosemide (LASIX) 20 MG tablet Take 1 tablet (20 mg total) by mouth daily for 5 days. 5 tablet 0   ibuprofen (ADVIL) 600 MG tablet Take 1 tablet (600 mg total) by mouth every 6  (six) hours. 30 tablet 0   lisinopril-hydrochlorothiazide (ZESTORETIC) 10-12.5 MG tablet TAKE 1 TABLET BY MOUTH EVERY DAY 90 tablet 1   meloxicam (MOBIC) 15 MG tablet Take 1 tablet (15 mg total) by mouth daily. 90 tablet 1   No current facility-administered medications for this visit.    No Known Allergies  Family History  Problem Relation Age of Onset   Hypertension Mother        Living   Fibromyalgia Mother    Diabetes Father 28       Deceased   Heart attack Father    Sudden death Father    Bipolar disorder Maternal Grandmother    Diabetes Maternal Grandfather    Alcohol abuse Paternal Grandfather    Liver disease Paternal Grandfather    Hyperlipidemia Paternal Grandmother    Kidney disease Paternal Grandmother    Liver disease Paternal Grandmother    Heart disease Paternal Grandmother    Healthy Sister    Healthy Son     Social History   Socioeconomic History   Marital status: Single    Spouse name: Not on file   Number of children: Not on file   Years of education: Not on file   Highest education level: Not on file  Occupational History   Not on file  Tobacco Use   Smoking status: Former    Packs/day:  0.00    Types: Cigarettes    Quit date: 2020    Years since quitting: 4.1   Smokeless tobacco: Never  Vaping Use   Vaping Use: Never used  Substance and Sexual Activity   Alcohol use: Not Currently    Comment: ocassional   Drug use: No   Sexual activity: Not Currently    Partners: Male  Other Topics Concern   Not on file  Social History Narrative   Not on file   Social Determinants of Health   Financial Resource Strain: Not on file  Food Insecurity: Not on file  Transportation Needs: Not on file  Physical Activity: Not on file  Stress: Not on file  Social Connections: Not on file  Intimate Partner Violence: Not on file     Constitutional: Denies fever, malaise, fatigue, headache or abrupt weight changes.  HEENT: Patient reports right ear pain,  nasal congestion.  Denies eye pain, eye redness, ringing in the ears, wax buildup, runny nose, bloody nose, or sore throat. Respiratory: Patient reports cough.  Denies difficulty breathing, shortness of breath, or sputum production.   Cardiovascular: Denies chest pain, chest tightness, palpitations or swelling in the hands or feet.  Gastrointestinal: Denies abdominal pain, bloating, constipation, diarrhea or blood in the stool.  Neurological: Denies dizziness, difficulty with memory, difficulty with speech or problems with balance and coordination.    No other specific complaints in a complete review of systems (except as listed in HPI above).  Objective:   Physical Exam  BP 134/82 (BP Location: Left Arm, Patient Position: Sitting, Cuff Size: Large)   Pulse (!) 112   Temp (!) 96.8 F (36 C) (Temporal)   Wt 237 lb (107.5 kg)   SpO2 100%   BMI 39.44 kg/m   Wt Readings from Last 3 Encounters:  04/22/22 207 lb (93.9 kg)  01/15/22 241 lb 12.8 oz (109.7 kg)  01/02/22 269 lb 11.2 oz (122.3 kg)    General: Appears her stated age, obese in NAD. Skin: Clammy HEENT: Head: normal shape and size, no sinus tenderness noted; Eyes: sclera white, no icterus, conjunctiva pink, PERRLA and EOMs intact; Right Ear: Cerumen impaction; Nose: mucosa pink and moist, septum midline; Throat/Mouth: Teeth present, mucosa pink and moist, no exudate, lesions or ulcerations noted.  Neck: No adenopathy noted. Cardiovascular: Tachycardic with normal rhythm. S1,S2 noted.  No murmur, rubs or gallops noted.  Pulmonary/Chest: Normal effort and positive vesicular breath sounds. No respiratory distress. No wheezes, rales or ronchi noted.  Neurological: Alert and oriented.  Coordination normal.     BMET    Component Value Date/Time   NA 139 02/26/2022 1133   K 4.7 02/26/2022 1133   CL 101 02/26/2022 1133   CO2 23 02/26/2022 1133   GLUCOSE 127 (H) 02/26/2022 1133   GLUCOSE 84 01/02/2022 1850   BUN 12 02/26/2022  1133   CREATININE 0.84 02/26/2022 1133   CREATININE 0.80 03/21/2021 1031   CALCIUM 9.4 02/26/2022 1133   GFRNONAA >60 01/02/2022 1850   GFRAA >60 05/01/2019 1723    Lipid Panel     Component Value Date/Time   CHOL 214 (H) 04/22/2022 1329   TRIG 195 (H) 04/22/2022 1329   HDL 40 (L) 04/22/2022 1329   CHOLHDL 5.4 (H) 04/22/2022 1329   VLDL 26.8 03/27/2020 1227   LDLCALC 140 (H) 04/22/2022 1329    CBC    Component Value Date/Time   WBC 7.4 02/26/2022 1133   WBC 18.3 (H) 01/03/2022 0323   RBC  4.96 02/26/2022 1133   RBC 4.01 01/03/2022 0323   HGB 12.1 02/26/2022 1133   HCT 38.1 02/26/2022 1133   PLT 307 02/26/2022 1133   MCV 77 (L) 02/26/2022 1133   MCH 24.4 (L) 02/26/2022 1133   MCH 27.9 01/03/2022 0323   MCHC 31.8 02/26/2022 1133   MCHC 33.7 01/03/2022 0323   RDW 13.8 02/26/2022 1133   LYMPHSABS 2.4 07/06/2021 1058   MONOABS 0.5 12/26/2020 1426   EOSABS 0.1 07/06/2021 1058   BASOSABS 0.1 07/06/2021 1058    Hgb A1C Lab Results  Component Value Date   HGBA1C 5.5 04/22/2022           Assessment & Plan:   Otalgia Right Ear secondary to Cerumen Impaction:  Manual lavage by CMA Advised her to try Debrox 2 times weekly to prevent wax buildup  Nasal Congestion and Cough:  Viral versus allergy Improving per patient No further treatment needed at this time  RTC in 3 months for follow-up of chronic conditions Webb Silversmith, NP

## 2022-07-15 NOTE — Patient Instructions (Signed)

## 2022-10-24 ENCOUNTER — Ambulatory Visit: Payer: Medicaid Other | Admitting: Internal Medicine

## 2022-10-24 ENCOUNTER — Encounter: Payer: Self-pay | Admitting: Internal Medicine

## 2022-10-24 VITALS — BP 154/88 | HR 109 | Temp 95.4°F | Wt 237.0 lb

## 2022-10-24 DIAGNOSIS — F902 Attention-deficit hyperactivity disorder, combined type: Secondary | ICD-10-CM

## 2022-10-24 DIAGNOSIS — F419 Anxiety disorder, unspecified: Secondary | ICD-10-CM | POA: Diagnosis not present

## 2022-10-24 DIAGNOSIS — I10 Essential (primary) hypertension: Secondary | ICD-10-CM | POA: Diagnosis not present

## 2022-10-24 DIAGNOSIS — E782 Mixed hyperlipidemia: Secondary | ICD-10-CM | POA: Insufficient documentation

## 2022-10-24 DIAGNOSIS — R7309 Other abnormal glucose: Secondary | ICD-10-CM | POA: Diagnosis not present

## 2022-10-24 DIAGNOSIS — Z840 Family history of diseases of the skin and subcutaneous tissue: Secondary | ICD-10-CM

## 2022-10-24 DIAGNOSIS — G8929 Other chronic pain: Secondary | ICD-10-CM | POA: Diagnosis not present

## 2022-10-24 DIAGNOSIS — R768 Other specified abnormal immunological findings in serum: Secondary | ICD-10-CM | POA: Diagnosis not present

## 2022-10-24 DIAGNOSIS — M2550 Pain in unspecified joint: Secondary | ICD-10-CM

## 2022-10-24 DIAGNOSIS — Z6839 Body mass index (BMI) 39.0-39.9, adult: Secondary | ICD-10-CM

## 2022-10-24 DIAGNOSIS — Z8249 Family history of ischemic heart disease and other diseases of the circulatory system: Secondary | ICD-10-CM

## 2022-10-24 MED ORDER — CELECOXIB 50 MG PO CAPS: 50.0000 mg | ORAL_CAPSULE | Freq: Two times a day (BID) | ORAL | 1 refills | Status: DC

## 2022-10-24 MED ORDER — LISINOPRIL-HYDROCHLOROTHIAZIDE 10-12.5 MG PO TABS
2.0000 | ORAL_TABLET | Freq: Every day | ORAL | 1 refills | Status: DC
Start: 2022-10-24 — End: 2022-11-12

## 2022-10-24 NOTE — Assessment & Plan Note (Signed)
Will recheck ESR, CRP, RF, ANA, C3/C4, anti-double-stranded DNA Stop Meloxicam due to elevated blood pressure Will start Celebrex 50 mg twice daily

## 2022-10-24 NOTE — Progress Notes (Signed)
Subjective:    Patient ID: Alexis Ellis, female    DOB: 11-07-1990, 32 y.o.   MRN: [id number]  HPI  Patient presents to clinic today for 19-month follow-up of chronic conditions.  HTN: Her BP today is 148/82.  She is taking Lisinopril HCT as prescribed.  ECG from 12/2020 reviewed.  Anxiety: Chronic, managed on Clonazepam as needed.  She does not see a therapist but is seeing psychiatry.  She denies depression, SI/HI.  ADD: She reports inattention, difficulty concentrating.  She is taking Adderall as prescribed by psychiatry.  HLD: Her last LDL was 140, triglycerides 195, 04/2022.  She is not taking any cholesterol-lowering medication at this time. She has failed Atorvastatin in the past due to myalgia. She does not consume low-fat diet.  Chronic Joint Pain: Mainly in her hands and knees.  She is taking Meloxicam as prescribed. She has had a positive ANA in the past. She is not following with rheumatology.  She is concerned about heart issues. She reports her father died of an MI at 6, uncle died of MI at 7. She has a cousin 61, with CAD that already has an 70% blockage.  Review of Systems     Past Medical History:  Diagnosis Date   ADD (attention deficit disorder)    Chicken pox    Depression    Diet controlled gestational diabetes mellitus (GDM) in third trimester 03/23/2019   Ectopic pregnancy    Elevated blood pressure    Elevated cholesterol    Frequent headaches    Gestational diabetes    Gestational hypertension 04/19/2019   History of gestational diabetes in prior pregnancy, currently pregnant 07/06/2021   History of kidney stones    HLD (hyperlipidemia) 09/29/2015   Pregnancy induced hypertension    Status post emergency cesarean section 01/03/2022    Current Outpatient Medications  Medication Sig Dispense Refill   amphetamine-dextroamphetamine (ADDERALL) 20 MG tablet Take 20 mg by mouth 3 (three) times daily.     clonazePAM (KLONOPIN) 1 MG tablet Take 1 mg  by mouth daily as needed for anxiety.     fluticasone (FLONASE) 50 MCG/ACT nasal spray Place 2 sprays into both nostrils daily. 16 g 6   lisinopril-hydrochlorothiazide (ZESTORETIC) 10-12.5 MG tablet TAKE 1 TABLET BY MOUTH EVERY DAY 90 tablet 1   meloxicam (MOBIC) 15 MG tablet Take 1 tablet (15 mg total) by mouth daily. 90 tablet 1   No current facility-administered medications for this visit.    No Known Allergies  Family History  Problem Relation Age of Onset   Hypertension Mother        Living   Fibromyalgia Mother    Diabetes Father 18       Deceased   Heart attack Father    Sudden death Father    Bipolar disorder Maternal Grandmother    Diabetes Maternal Grandfather    Alcohol abuse Paternal Grandfather    Liver disease Paternal Grandfather    Hyperlipidemia Paternal Grandmother    Kidney disease Paternal Grandmother    Liver disease Paternal Grandmother    Heart disease Paternal Grandmother    Healthy Sister    Healthy Son     Social History   Socioeconomic History   Marital status: Single    Spouse name: Not on file   Number of children: Not on file   Years of education: Not on file   Highest education level: Some college, no degree  Occupational History   Not on file  Tobacco Use   Smoking status: Former    Packs/day: 0    Types: Cigarettes    Quit date: 2020    Years since quitting: 4.4   Smokeless tobacco: Never  Vaping Use   Vaping Use: Never used  Substance and Sexual Activity   Alcohol use: Not Currently    Comment: ocassional   Drug use: No   Sexual activity: Not Currently    Partners: Male  Other Topics Concern   Not on file  Social History Narrative   Not on file   Social Determinants of Health   Financial Resource Strain: Patient Declined (10/23/2022)   Overall Financial Resource Strain (CARDIA)    Difficulty of Paying Living Expenses: Patient declined  Food Insecurity: No Food Insecurity (10/23/2022)   Hunger Vital Sign    Worried  About Running Out of Food in the Last Year: Never true    Ran Out of Food in the Last Year: Never true  Transportation Needs: No Transportation Needs (10/23/2022)   PRAPARE - Administrator, Civil Service (Medical): No    Lack of Transportation (Non-Medical): No  Physical Activity: Insufficiently Active (10/23/2022)   Exercise Vital Sign    Days of Exercise per Week: 5 days    Minutes of Exercise per Session: 10 min  Stress: Stress Concern Present (10/23/2022)   [employer] of Occupational Health - Occupational Stress Questionnaire    Feeling of Stress : Very much  Social Connections: Moderately Isolated (10/23/2022)   Social Connection and Isolation Panel [NHANES]    Frequency of Communication with Friends and Family: More than three times a week    Frequency of Social Gatherings with Friends and Family: Once a week    Attends Religious Services: 1 to 4 times per year    Active Member of [employer] or Organizations: No    Attends Engineer, structural: Not on file    Marital Status: Never married  Intimate Partner Violence: Not on file     Constitutional: Denies fever, malaise, fatigue, headache or abrupt weight changes.  HEENT: Denies eye pain, eye redness, ear pain, ringing in the ears, wax buildup, runny nose, nasal congestion, bloody nose, or sore throat. Respiratory: Denies difficulty breathing, shortness of breath, cough or sputum production.   Cardiovascular: Pt reports intermittent swelling in legs. Denies chest pain, chest tightness, palpitations or swelling in the hands.  Gastrointestinal: Denies abdominal pain, bloating, constipation, diarrhea or blood in the stool.  GU: Denies urgency, frequency, pain with urination, burning sensation, blood in urine, odor or discharge. Musculoskeletal: Patient reports chronic joint pain.  Denies decrease in range of motion, difficulty with gait, muscle pain or joint swelling.  Skin: Denies redness, rashes, lesions or  ulcercations.  Neurological: Patient reports inattention.  Denies dizziness, difficulty with memory, difficulty with speech or problems with balance and coordination.  Psych: Patient has a history of anxiety.  Denies depression, SI/HI.  No other specific complaints in a complete review of systems (except as listed in HPI above).  Objective:   Physical Exam  BP (!) 154/88 (BP Location: Left Arm, Patient Position: Sitting, Cuff Size: Large)   Pulse (!) 109   Temp (!) 95.4 F (35.2 C) (Temporal)   Wt 237 lb (107.5 kg)   SpO2 99%   BMI 39.44 kg/m   Wt Readings from Last 3 Encounters:  07/15/22 237 lb (107.5 kg)  04/22/22 207 lb (93.9 kg)  01/15/22 241 lb 12.8 oz (109.7 kg)  General: Appears her stated age, obese, in NAD. Skin: Warm, dry and intact.  HEENT: Head: normal shape and size; Eyes: sclera white, no icterus, conjunctiva pink, PERRLA and EOMs intact;  Cardiovascular: Tachycardic with normal rhythm. S1,S2 noted.  No murmur, rubs or gallops noted. Trace pitting BLE edema.  Pulmonary/Chest: Normal effort and positive vesicular breath sounds. No respiratory distress. No wheezes, rales or ronchi noted.  Musculoskeletal: Normal flexion and extension of the fingers. No signs of joint swelling. No difficulty with gait.  Neurological: Alert and oriented. Coordination normal.  Psychiatric: Mood and affect normal. Anxious appearing. Judgment and thought content normal.     BMET    Component Value Date/Time   NA 139 02/26/2022 1133   K 4.7 02/26/2022 1133   CL 101 02/26/2022 1133   CO2 23 02/26/2022 1133   GLUCOSE 127 (H) 02/26/2022 1133   GLUCOSE 84 01/02/2022 1850   BUN 12 02/26/2022 1133   CREATININE 0.84 02/26/2022 1133   CREATININE 0.80 03/21/2021 1031   CALCIUM 9.4 02/26/2022 1133   GFRNONAA >60 01/02/2022 1850   GFRAA >60 05/01/2019 1723    Lipid Panel     Component Value Date/Time   CHOL 214 (H) 04/22/2022 1329   TRIG 195 (H) 04/22/2022 1329   HDL 40 (L)  04/22/2022 1329   CHOLHDL 5.4 (H) 04/22/2022 1329   VLDL 26.8 03/27/2020 1227   LDLCALC 140 (H) 04/22/2022 1329    CBC    Component Value Date/Time   WBC 7.4 02/26/2022 1133   WBC 18.3 (H) 01/03/2022 0323   RBC 4.96 02/26/2022 1133   RBC 4.01 01/03/2022 0323   HGB 12.1 02/26/2022 1133   HCT 38.1 02/26/2022 1133   PLT 307 02/26/2022 1133   MCV 77 (L) 02/26/2022 1133   MCH 24.4 (L) 02/26/2022 1133   MCH 27.9 01/03/2022 0323   MCHC 31.8 02/26/2022 1133   MCHC 33.7 01/03/2022 0323   RDW 13.8 02/26/2022 1133   LYMPHSABS 2.4 07/06/2021 1058   MONOABS 0.5 12/26/2020 1426   EOSABS 0.1 07/06/2021 1058   BASOSABS 0.1 07/06/2021 1058    Hgb A1C Lab Results  Component Value Date   HGBA1C 5.5 04/22/2022           Assessment & Plan:      RTC in in 2 weeks, follow-up HTN, 6 months for annual exam Nicki Reaper, NP

## 2022-10-24 NOTE — Assessment & Plan Note (Signed)
Uncontrolled on Lisinopril HCT Will increase to 20-25 mg daily Reinforced DASH diet and exercise for weight loss

## 2022-10-24 NOTE — Assessment & Plan Note (Addendum)
Continue Clonazepam as needed Support offered

## 2022-10-24 NOTE — Assessment & Plan Note (Signed)
C-Met and lipid profile today Encouraged her to consume low-fat diet Will obtain CT calcium score given family history She would be willing to try different statin other than atorvastatin as she had myalgias with this in the past

## 2022-10-24 NOTE — Assessment & Plan Note (Signed)
Encourage diet and exercise for weight loss 

## 2022-10-24 NOTE — Assessment & Plan Note (Signed)
Continue Adderall per psychiatry 

## 2022-11-05 ENCOUNTER — Other Ambulatory Visit: Payer: Medicaid Other

## 2022-11-07 ENCOUNTER — Ambulatory Visit: Payer: Medicaid Other | Admitting: Internal Medicine

## 2022-11-07 NOTE — Progress Notes (Deleted)
Subjective:    Patient ID: Alexis Ellis, female    DOB: 1990-11-06, 32 y.o.   MRN: [id number]  HPI  Patient presents to clinic today for 2-week follow-up of HTN.  At her last visit, her lisinopril HCT was increased to 20-25 mg daily.  She has been taking the medication as prescribed.  Her BP today is.  ECG from 12/2020 reviewed.  Review of Systems     Past Medical History:  Diagnosis Date   ADD (attention deficit disorder)    Chicken pox    Depression    Diet controlled gestational diabetes mellitus (GDM) in third trimester 03/23/2019   Ectopic pregnancy    Elevated blood pressure    Elevated cholesterol    Frequent headaches    Gestational diabetes    Gestational hypertension 04/19/2019   History of gestational diabetes in prior pregnancy, currently pregnant 07/06/2021   History of kidney stones    HLD (hyperlipidemia) 09/29/2015   Pregnancy induced hypertension    Status post emergency cesarean section 01/03/2022    Current Outpatient Medications  Medication Sig Dispense Refill   amphetamine-dextroamphetamine (ADDERALL) 20 MG tablet Take 20 mg by mouth 3 (three) times daily.     celecoxib (CELEBREX) 50 MG capsule Take 1 capsule (50 mg total) by mouth 2 (two) times daily. 180 capsule 1   clonazePAM (KLONOPIN) 1 MG tablet Take 1 mg by mouth daily as needed for anxiety.     fluticasone (FLONASE) 50 MCG/ACT nasal spray Place 2 sprays into both nostrils daily. 16 g 6   lisinopril-hydrochlorothiazide (ZESTORETIC) 10-12.5 MG tablet Take 2 tablets by mouth daily. 180 tablet 1   No current facility-administered medications for this visit.    No Known Allergies  Family History  Problem Relation Age of Onset   Hypertension Mother        Living   Fibromyalgia Mother    Diabetes Father 42       Deceased   Heart attack Father    Sudden death Father    Bipolar disorder Maternal Grandmother    Diabetes Maternal Grandfather    Alcohol abuse Paternal Grandfather    Liver  disease Paternal Grandfather    Hyperlipidemia Paternal Grandmother    Kidney disease Paternal Grandmother    Liver disease Paternal Grandmother    Heart disease Paternal Grandmother    Healthy Sister    Healthy Son     Social History   Socioeconomic History   Marital status: Single    Spouse name: Not on file   Number of children: Not on file   Years of education: Not on file   Highest education level: Some college, no degree  Occupational History   Not on file  Tobacco Use   Smoking status: Former    Packs/day: 0    Types: Cigarettes    Quit date: 2020    Years since quitting: 4.4   Smokeless tobacco: Never  Vaping Use   Vaping Use: Never used  Substance and Sexual Activity   Alcohol use: Not Currently    Comment: ocassional   Drug use: No   Sexual activity: Not Currently    Partners: Male  Other Topics Concern   Not on file  Social History Narrative   Not on file   Social Determinants of Health   Financial Resource Strain: Patient Declined (10/23/2022)   Overall Financial Resource Strain (CARDIA)    Difficulty of Paying Living Expenses: Patient declined  Food Insecurity: No Food Insecurity (  10/23/2022)   Hunger Vital Sign    Worried About Running Out of Food in the Last Year: Never true    Ran Out of Food in the Last Year: Never true  Transportation Needs: No Transportation Needs (10/23/2022)   PRAPARE - Administrator, Civil Service (Medical): No    Lack of Transportation (Non-Medical): No  Physical Activity: Insufficiently Active (10/23/2022)   Exercise Vital Sign    Days of Exercise per Week: 5 days    Minutes of Exercise per Session: 10 min  Stress: Stress Concern Present (10/23/2022)   [employer] of Occupational Health - Occupational Stress Questionnaire    Feeling of Stress : Very much  Social Connections: Moderately Isolated (10/23/2022)   Social Connection and Isolation Panel [NHANES]    Frequency of Communication with Friends and  Family: More than three times a week    Frequency of Social Gatherings with Friends and Family: Once a week    Attends Religious Services: 1 to 4 times per year    Active Member of [employer] or Organizations: No    Attends Engineer, structural: Not on file    Marital Status: Never married  Intimate Partner Violence: Not on file     Constitutional: Denies fever, malaise, fatigue, headache or abrupt weight changes.  HEENT: Denies eye pain, eye redness, ear pain, ringing in the ears, wax buildup, runny nose, nasal congestion, bloody nose, or sore throat. Respiratory: Denies difficulty breathing, shortness of breath, cough or sputum production.   Cardiovascular: Denies chest pain, chest tightness, palpitations or swelling in the hands or feet.  Gastrointestinal: Denies abdominal pain, bloating, constipation, diarrhea or blood in the stool.  GU: Denies urgency, frequency, pain with urination, burning sensation, blood in urine, odor or discharge. Musculoskeletal: Patient reports joint pain.  Denies decrease in range of motion, difficulty with gait, muscle pain or joint swelling.  Skin: Denies redness, rashes, lesions or ulcercations.  Neurological: Patient reports inattention.  Denies dizziness, difficulty with memory, difficulty with speech or problems with balance and coordination.  Psych: Patient has a history of anxiety.  Denies depression, SI/HI.  No other specific complaints in a complete review of systems (except as listed in HPI above).  Objective:   Physical Exam  There were no vitals taken for this visit. Wt Readings from Last 3 Encounters:  10/24/22 237 lb (107.5 kg)  07/15/22 237 lb (107.5 kg)  04/22/22 207 lb (93.9 kg)    General: Appears their stated age, well developed, well nourished in NAD. Skin: Warm, dry and intact. No rashes, lesions or ulcerations noted. HEENT: Head: normal shape and size; Eyes: sclera white, no icterus, conjunctiva pink, PERRLA and EOMs intact;  Ears: Tm's gray and intact, normal light reflex; Nose: mucosa pink and moist, septum midline; Throat/Mouth: Teeth present, mucosa pink and moist, no exudate, lesions or ulcerations noted.  Neck:  Neck supple, trachea midline. No masses, lumps or thyromegaly present.  Cardiovascular: Normal rate and rhythm. S1,S2 noted.  No murmur, rubs or gallops noted. No JVD or BLE edema. No carotid bruits noted. Pulmonary/Chest: Normal effort and positive vesicular breath sounds. No respiratory distress. No wheezes, rales or ronchi noted.  Abdomen: Soft and nontender. Normal bowel sounds. No distention or masses noted. Liver, spleen and kidneys non palpable. Musculoskeletal: Normal range of motion. No signs of joint swelling. No difficulty with gait.  Neurological: Alert and oriented. Cranial nerves II-XII grossly intact. Coordination normal.  Psychiatric: Mood and affect normal. Behavior is normal.  Judgment and thought content normal.    BMET    Component Value Date/Time   NA 139 02/26/2022 1133   K 4.7 02/26/2022 1133   CL 101 02/26/2022 1133   CO2 23 02/26/2022 1133   GLUCOSE 127 (H) 02/26/2022 1133   GLUCOSE 84 01/02/2022 1850   BUN 12 02/26/2022 1133   CREATININE 0.84 02/26/2022 1133   CREATININE 0.80 03/21/2021 1031   CALCIUM 9.4 02/26/2022 1133   GFRNONAA >60 01/02/2022 1850   GFRAA >60 05/01/2019 1723    Lipid Panel     Component Value Date/Time   CHOL 214 (H) 04/22/2022 1329   TRIG 195 (H) 04/22/2022 1329   HDL 40 (L) 04/22/2022 1329   CHOLHDL 5.4 (H) 04/22/2022 1329   VLDL 26.8 03/27/2020 1227   LDLCALC 140 (H) 04/22/2022 1329    CBC    Component Value Date/Time   WBC 7.4 02/26/2022 1133   WBC 18.3 (H) 01/03/2022 0323   RBC 4.96 02/26/2022 1133   RBC 4.01 01/03/2022 0323   HGB 12.1 02/26/2022 1133   HCT 38.1 02/26/2022 1133   PLT 307 02/26/2022 1133   MCV 77 (L) 02/26/2022 1133   MCH 24.4 (L) 02/26/2022 1133   MCH 27.9 01/03/2022 0323   MCHC 31.8 02/26/2022 1133   MCHC  33.7 01/03/2022 0323   RDW 13.8 02/26/2022 1133   LYMPHSABS 2.4 07/06/2021 1058   MONOABS 0.5 12/26/2020 1426   EOSABS 0.1 07/06/2021 1058   BASOSABS 0.1 07/06/2021 1058    Hgb A1C Lab Results  Component Value Date   HGBA1C 5.5 04/22/2022            Assessment & Plan:     RTC in 6 months for annual exam Nicki Reaper, NP

## 2022-11-12 ENCOUNTER — Encounter: Payer: Self-pay | Admitting: Internal Medicine

## 2022-11-12 ENCOUNTER — Ambulatory Visit: Payer: Medicaid Other | Admitting: Internal Medicine

## 2022-11-12 ENCOUNTER — Other Ambulatory Visit: Payer: Self-pay | Admitting: Internal Medicine

## 2022-11-12 VITALS — BP 128/76 | HR 105 | Temp 96.6°F | Wt 234.0 lb

## 2022-11-12 DIAGNOSIS — I10 Essential (primary) hypertension: Secondary | ICD-10-CM | POA: Diagnosis not present

## 2022-11-12 DIAGNOSIS — Z6838 Body mass index (BMI) 38.0-38.9, adult: Secondary | ICD-10-CM

## 2022-11-12 DIAGNOSIS — R7309 Other abnormal glucose: Secondary | ICD-10-CM | POA: Diagnosis not present

## 2022-11-12 DIAGNOSIS — E782 Mixed hyperlipidemia: Secondary | ICD-10-CM

## 2022-11-12 DIAGNOSIS — Z840 Family history of diseases of the skin and subcutaneous tissue: Secondary | ICD-10-CM

## 2022-11-12 DIAGNOSIS — G8929 Other chronic pain: Secondary | ICD-10-CM | POA: Diagnosis not present

## 2022-11-12 DIAGNOSIS — M2550 Pain in unspecified joint: Secondary | ICD-10-CM | POA: Diagnosis not present

## 2022-11-12 DIAGNOSIS — R739 Hyperglycemia, unspecified: Secondary | ICD-10-CM

## 2022-11-12 DIAGNOSIS — E66812 Obesity, class 2: Secondary | ICD-10-CM

## 2022-11-12 MED ORDER — OLMESARTAN MEDOXOMIL-HCTZ 20-12.5 MG PO TABS: 1.0000 | ORAL_TABLET | Freq: Every day | ORAL | 0 refills | Status: DC

## 2022-11-12 NOTE — Progress Notes (Signed)
Subjective:    Patient ID: Alexis Ellis, female    DOB: January 14, 1991, 32 y.o.   MRN: [id number]  HPI  Patient presents to clinic today for follow-up of HTN.  At her last visit, her lisinopril HCT was increased to 20-25 mg daily.  She has been taking the medication as prescribed. She has noticed a cough. Her BP today is 128/76.  ECG from 12/2020 reviewed.  Review of Systems  Past Medical History:  Diagnosis Date   ADD (attention deficit disorder)    Chicken pox    Depression    Diet controlled gestational diabetes mellitus (GDM) in third trimester 03/23/2019   Ectopic pregnancy    Elevated blood pressure    Elevated cholesterol    Frequent headaches    Gestational diabetes    Gestational hypertension 04/19/2019   History of gestational diabetes in prior pregnancy, currently pregnant 07/06/2021   History of kidney stones    HLD (hyperlipidemia) 09/29/2015   Pregnancy induced hypertension    Status post emergency cesarean section 01/03/2022    Current Outpatient Medications  Medication Sig Dispense Refill   amphetamine-dextroamphetamine (ADDERALL) 20 MG tablet Take 20 mg by mouth 3 (three) times daily.     celecoxib (CELEBREX) 50 MG capsule Take 1 capsule (50 mg total) by mouth 2 (two) times daily. 180 capsule 1   clonazePAM (KLONOPIN) 1 MG tablet Take 1 mg by mouth daily as needed for anxiety.     fluticasone (FLONASE) 50 MCG/ACT nasal spray Place 2 sprays into both nostrils daily. 16 g 6   lisinopril-hydrochlorothiazide (ZESTORETIC) 10-12.5 MG tablet Take 2 tablets by mouth daily. 180 tablet 1   No current facility-administered medications for this visit.    No Known Allergies  Family History  Problem Relation Age of Onset   Hypertension Mother        Living   Fibromyalgia Mother    Diabetes Father 1       Deceased   Heart attack Father    Sudden death Father    Bipolar disorder Maternal Grandmother    Diabetes Maternal Grandfather    Alcohol abuse Paternal  Grandfather    Liver disease Paternal Grandfather    Hyperlipidemia Paternal Grandmother    Kidney disease Paternal Grandmother    Liver disease Paternal Grandmother    Heart disease Paternal Grandmother    Healthy Sister    Healthy Son     Social History   Socioeconomic History   Marital status: Single    Spouse name: Not on file   Number of children: Not on file   Years of education: Not on file   Highest education level: Some college, no degree  Occupational History   Not on file  Tobacco Use   Smoking status: Former    Packs/day: 0    Types: Cigarettes    Quit date: 2020    Years since quitting: 4.5   Smokeless tobacco: Never  Vaping Use   Vaping Use: Never used  Substance and Sexual Activity   Alcohol use: Not Currently    Comment: ocassional   Drug use: No   Sexual activity: Not Currently    Partners: Male  Other Topics Concern   Not on file  Social History Narrative   Not on file   Social Determinants of Health   Financial Resource Strain: Patient Declined (10/23/2022)   Overall Financial Resource Strain (CARDIA)    Difficulty of Paying Living Expenses: Patient declined  Food Insecurity: No Food  Insecurity (10/23/2022)   Hunger Vital Sign    Worried About Running Out of Food in the Last Year: Never true    Ran Out of Food in the Last Year: Never true  Transportation Needs: No Transportation Needs (10/23/2022)   PRAPARE - Administrator, Civil Service (Medical): No    Lack of Transportation (Non-Medical): No  Physical Activity: Insufficiently Active (10/23/2022)   Exercise Vital Sign    Days of Exercise per Week: 5 days    Minutes of Exercise per Session: 10 min  Stress: Stress Concern Present (10/23/2022)   [employer] of Occupational Health - Occupational Stress Questionnaire    Feeling of Stress : Very much  Social Connections: Moderately Isolated (10/23/2022)   Social Connection and Isolation Panel [NHANES]    Frequency of  Communication with Friends and Family: More than three times a week    Frequency of Social Gatherings with Friends and Family: Once a week    Attends Religious Services: 1 to 4 times per year    Active Member of [employer] or Organizations: No    Attends Engineer, structural: Not on file    Marital Status: Never married  Intimate Partner Violence: Not on file     Constitutional: Denies fever, malaise, fatigue, headache or abrupt weight changes.  HEENT: Denies eye pain, eye redness, ear pain, ringing in the ears, wax buildup, runny nose, nasal congestion, bloody nose, or sore throat. Respiratory: Pt reports cough. Denies difficulty breathing, shortness of breath, or sputum production.   Cardiovascular: Denies chest pain, chest tightness, palpitations or swelling in the hands or feet.  Gastrointestinal: Denies abdominal pain, bloating, constipation, diarrhea or blood in the stool.  GU: Denies urgency, frequency, pain with urination, burning sensation, blood in urine, odor or discharge. Musculoskeletal: Patient is chronic joint pain.  Denies decrease in range of motion, difficulty with gait, muscle pain or joint swelling.  Skin: Denies redness, rashes, lesions or ulcercations.  Neurological: Patient reports inattention.  Denies dizziness, difficulty with memory, difficulty with speech or problems with balance and coordination.  Psych: Patient has a history of anxiety.  Denies depression, SI/HI.  No other specific complaints in a complete review of systems (except as listed in HPI above).     Objective:   Physical Exam   BP 128/76 (BP Location: Left Arm, Patient Position: Sitting, Cuff Size: Large)   Pulse (!) 105   Temp (!) 96.6 F (35.9 C) (Temporal)   Wt 234 lb (106.1 kg)   SpO2 99%   BMI 38.94 kg/m   Wt Readings from Last 3 Encounters:  10/24/22 237 lb (107.5 kg)  07/15/22 237 lb (107.5 kg)  04/22/22 207 lb (93.9 kg)    General: Appears her stated age, obese, in  NAD. Cardiovascular: Tachycardic with normal rhythm. S1,S2 noted.  No murmur, rubs or gallops noted.  Pulmonary/Chest: Normal effort and positive vesicular breath sounds. No respiratory distress. No wheezes, rales or ronchi noted.  Neurological: Alert and oriented.   BMET    Component Value Date/Time   NA 139 02/26/2022 1133   K 4.7 02/26/2022 1133   CL 101 02/26/2022 1133   CO2 23 02/26/2022 1133   GLUCOSE 127 (H) 02/26/2022 1133   GLUCOSE 84 01/02/2022 1850   BUN 12 02/26/2022 1133   CREATININE 0.84 02/26/2022 1133   CREATININE 0.80 03/21/2021 1031   CALCIUM 9.4 02/26/2022 1133   GFRNONAA >60 01/02/2022 1850   GFRAA >60 05/01/2019 1723    Lipid  Panel     Component Value Date/Time   CHOL 214 (H) 04/22/2022 1329   TRIG 195 (H) 04/22/2022 1329   HDL 40 (L) 04/22/2022 1329   CHOLHDL 5.4 (H) 04/22/2022 1329   VLDL 26.8 03/27/2020 1227   LDLCALC 140 (H) 04/22/2022 1329    CBC    Component Value Date/Time   WBC 7.4 02/26/2022 1133   WBC 18.3 (H) 01/03/2022 0323   RBC 4.96 02/26/2022 1133   RBC 4.01 01/03/2022 0323   HGB 12.1 02/26/2022 1133   HCT 38.1 02/26/2022 1133   PLT 307 02/26/2022 1133   MCV 77 (L) 02/26/2022 1133   MCH 24.4 (L) 02/26/2022 1133   MCH 27.9 01/03/2022 0323   MCHC 31.8 02/26/2022 1133   MCHC 33.7 01/03/2022 0323   RDW 13.8 02/26/2022 1133   LYMPHSABS 2.4 07/06/2021 1058   MONOABS 0.5 12/26/2020 1426   EOSABS 0.1 07/06/2021 1058   BASOSABS 0.1 07/06/2021 1058    Hgb A1C Lab Results  Component Value Date   HGBA1C 5.5 04/22/2022           Assessment & Plan:     RTC in 5 months for annual exam Nicki Reaper, NP

## 2022-11-12 NOTE — Assessment & Plan Note (Signed)
Encourage diet and exercise for weight loss 

## 2022-11-12 NOTE — Assessment & Plan Note (Signed)
Controlled on increased dose of lisinopril HCT but given her cough she would like to change medications at this time Rx for olmesartan-HCT 20-12.5 Reinforced DASH diet and exercise for weight loss

## 2022-11-12 NOTE — Patient Instructions (Signed)
How to Take Your Blood Pressure Blood pressure measures how strongly your blood is pressing against the walls of your arteries. Arteries are blood vessels that carry blood from your heart throughout your body. You can take your blood pressure at home with a machine. You may need to check your blood pressure at home: To check if you have high blood pressure (hypertension). To check your blood pressure over time. To make sure your blood pressure medicine is working. Supplies needed: Blood pressure machine, or monitor. A chair to sit in. This should be a chair where you can sit upright with your back supported. Do not sit on a soft couch or an armchair. Table or desk. Small notebook. Pencil or pen. How to prepare Avoid these things for 30 minutes before checking your blood pressure: Having drinks with caffeine in them, such as coffee or tea. Drinking alcohol. Eating. Smoking. Exercising. Do these things five minutes before checking your blood pressure: Go to the bathroom and pee (urinate). Sit in a chair. Be quiet. Do not talk. How to take your blood pressure Follow the instructions that came with your machine. If you have a digital blood pressure monitor, these may be the instructions: Sit up straight. Place your feet on the floor. Do not cross your ankles or legs. Rest your left arm at the level of your heart. You may rest it on a table, desk, or chair. Pull up your shirt sleeve. Wrap the blood pressure cuff around the upper part of your left arm. The cuff should be 1 inch (2.5 cm) above your elbow. It is best to wrap the cuff around bare skin. Fit the cuff snugly around your arm, but not too tightly. You should be able to place only one finger between the cuff and your arm. Place the cord so that it rests in the bend of your elbow. Press the power button. Sit quietly while the cuff fills with air and loses air. Write down the numbers on the screen. Wait 2-3 minutes and then repeat  steps 1-10. What do the numbers mean? Two numbers make up your blood pressure. The first number is called systolic pressure. The second is called diastolic pressure. An example of a blood pressure reading is "120 over 80" (or 120/80). If you are an adult and do not have a medical condition, use this guide to find out if your blood pressure is normal: Normal First number: below 120. Second number: below 80. Elevated First number: 120-129. Second number: below 80. Hypertension stage 1 First number: 130-139. Second number: 80-89. Hypertension stage 2 First number: 140 or above. Second number: 90 or above. Your blood pressure is above normal even if only the first or only the second number is above normal. Follow these instructions at home: Medicines Take over-the-counter and prescription medicines only as told by your doctor. Tell your doctor if your medicine is causing side effects. General instructions Check your blood pressure as often as your doctor tells you to. Check your blood pressure at the same time every day. Take your monitor to your next doctor's appointment. Your doctor will: Make sure you are using it correctly. Make sure it is working right. Understand what your blood pressure numbers should be. Keep all follow-up visits. General tips You will need a blood pressure machine or monitor. Your doctor can suggest a monitor. You can buy one at a drugstore or online. When choosing one: Choose one with an arm cuff. Choose one that wraps around your   upper arm. Only one finger should fit between your arm and the cuff. Do not choose one that measures your blood pressure from your wrist or finger. Where to find more information American Heart Association: www.heart.org Contact a doctor if: Your blood pressure keeps being high. Your blood pressure is suddenly low. Get help right away if: Your first blood pressure number is higher than 180. Your second blood pressure number is  higher than 120. These symptoms may be an emergency. Do not wait to see if the symptoms will go away. Get help right away. Call 911. Summary Check your blood pressure at the same time every day. Avoid caffeine, alcohol, smoking, and exercise for 30 minutes before checking your blood pressure. Make sure you understand what your blood pressure numbers should be. This information is not intended to replace advice given to you by your health care provider. Make sure you discuss any questions you have with your health care provider. Document Revised: 01/11/2021 Document Reviewed: 01/11/2021 Elsevier Patient Education  2024 [employer]  

## 2022-11-13 NOTE — Telephone Encounter (Signed)
Both request Rx are no longer on current medication list Requested Prescriptions  Pending Prescriptions Disp Refills   meloxicam (MOBIC) 15 MG tablet [Pharmacy Med Name: MELOXICAM 15 MG TABLET] 90 tablet 1    Sig: TAKE 1 TABLET (15 MG TOTAL) BY MOUTH DAILY.     Analgesics:  COX2 Inhibitors Failed - 11/12/2022  9:34 AM      Failed - Manual Review: Labs are only required if the patient has taken medication for more than 8 weeks.      Failed - ALT in normal range and within 360 days    ALT  Date Value Ref Range Status  11/12/2022 32 (H) 6 - 29 U/L Final         Passed - HGB in normal range and within 360 days    Hemoglobin  Date Value Ref Range Status  11/12/2022 15.2 11.7 - 15.5 g/dL Final  19/14/7829 56.2 11.1 - 15.9 g/dL Final         Passed - Cr in normal range and within 360 days    Creat  Date Value Ref Range Status  11/12/2022 0.79 0.50 - 0.97 mg/dL Final   Creatinine, Urine  Date Value Ref Range Status  01/03/2022 199 mg/dL Final         Passed - HCT in normal range and within 360 days    HCT  Date Value Ref Range Status  11/12/2022 44.9 35.0 - 45.0 % Final   Hematocrit  Date Value Ref Range Status  02/26/2022 38.1 34.0 - 46.6 % Final         Passed - AST in normal range and within 360 days    AST  Date Value Ref Range Status  11/12/2022 16 10 - 30 U/L Final         Passed - eGFR is 30 or above and within 360 days    GFR calc Af Amer  Date Value Ref Range Status  05/01/2019 >60 >60 mL/min Final   GFR, Estimated  Date Value Ref Range Status  01/02/2022 >60 >60 mL/min Final    Comment:    (NOTE) Calculated using the CKD-EPI Creatinine Equation (2021)    GFR  Date Value Ref Range Status  03/27/2020 95.38 >60.00 mL/min Final    Comment:    Calculated using the CKD-EPI Creatinine Equation (2021)   eGFR  Date Value Ref Range Status  11/12/2022 101 > OR = 60 mL/min/1.32m2 Final  02/26/2022 94 >59 mL/min/1.73 Final         Passed - Patient is not  pregnant      Passed - Valid encounter within last 12 months    Recent Outpatient Visits           Yesterday Primary hypertension   [place] [facility] [place], Salvadore Oxford, NP   2 weeks ago Mixed hyperlipidemia   [place] [facility] [place], Salvadore Oxford, NP   4 months ago Impacted cerumen of right ear   [place] [facility] [place], Salvadore Oxford, NP   6 months ago Encounter for general adult medical examination with abnormal findings   [place] [facility] [place], Salvadore Oxford, NP   1 year ago Impacted cerumen of right ear   [place] [facility] [place], Salvadore Oxford, NP       Future Appointments             In 5 months Lorre Munroe,  NP [place] [facility]             lisinopril-hydrochlorothiazide (ZESTORETIC) 10-12.5 MG tablet [Pharmacy Med Name: LISINOPRIL-HCTZ 10-12.5 MG TAB] 90 tablet 1    Sig: TAKE 1 TABLET BY MOUTH EVERY DAY     Cardiovascular:  ACEI + Diuretic Combos Failed - 11/12/2022  9:34 AM      Failed - Na in normal range and within 180 days    Sodium  Date Value Ref Range Status  11/12/2022 141 135 - 146 mmol/L Final  02/26/2022 139 134 - 144 mmol/L Final         Failed - K in normal range and within 180 days    Potassium  Date Value Ref Range Status  11/12/2022 4.4 3.5 - 5.3 mmol/L Final         Failed - Cr in normal range and within 180 days    Creat  Date Value Ref Range Status  11/12/2022 0.79 0.50 - 0.97 mg/dL Final   Creatinine, Urine  Date Value Ref Range Status  01/03/2022 199 mg/dL Final         Failed - eGFR is 30 or above and within 180 days    GFR calc Af Amer  Date Value Ref Range Status  05/01/2019 >60 >60 mL/min Final   GFR, Estimated  Date Value Ref Range Status  01/02/2022 >60 >60 mL/min Final    Comment:    (NOTE) Calculated using the CKD-EPI Creatinine Equation (2021)    GFR  Date Value Ref Range Status  03/27/2020  95.38 >60.00 mL/min Final    Comment:    Calculated using the CKD-EPI Creatinine Equation (2021)   eGFR  Date Value Ref Range Status  11/12/2022 101 > OR = 60 mL/min/1.18m2 Final  02/26/2022 94 >59 mL/min/1.73 Final         Failed - Last BP in normal range    BP Readings from Last 1 Encounters:  11/12/22 128/76         Passed - Patient is not pregnant      Passed - Valid encounter within last 6 months    Recent Outpatient Visits           Yesterday Primary hypertension   [place] [facility] [place], Salvadore Oxford, NP   2 weeks ago Mixed hyperlipidemia   [place] [facility] [place], Salvadore Oxford, NP   4 months ago Impacted cerumen of right ear   [place] [facility] [place], Salvadore Oxford, NP   6 months ago Encounter for general adult medical examination with abnormal findings   [place] [facility] [place], Salvadore Oxford, NP   1 year ago Impacted cerumen of right ear   [place] [facility] [place], Salvadore Oxford, NP       Future Appointments             In 5 months [place], Salvadore Oxford, NP [place] [facility]

## 2022-11-16 LAB — ANTI-DNA ANTIBODY, DOUBLE-STRANDED: ds DNA Ab: <1 IU/mL

## 2022-11-16 LAB — CBC
HCT: 44.9 % (ref 35.0–45.0)
Hemoglobin: 15.2 g/dL (ref 11.7–15.5)
MCH: 29 pg (ref 27.0–33.0)
MCHC: 33.9 g/dL (ref 32.0–36.0)
MCV: 85.7 fL (ref 80.0–100.0)
MPV: 9.7 fL (ref 7.5–12.5)
Platelets: 316 10*3/uL (ref 140–400)
RBC: 5.24 10*6/uL — ABNORMAL HIGH (ref 3.80–5.10)
RDW: 13.1 % (ref 11.0–15.0)
WBC: 8.2 10*3/uL (ref 3.8–10.8)

## 2022-11-16 LAB — COMPLETE METABOLIC PANEL WITH GFR
AG Ratio: 1.8 (calc) (ref 1.0–2.5)
ALT: 32 U/L — ABNORMAL HIGH (ref 6–29)
AST: 16 U/L (ref 10–30)
Albumin: 4.6 g/dL (ref 3.6–5.1)
Alkaline phosphatase (APISO): 93 U/L (ref 31–125)
BUN: 17 mg/dL (ref 7–25)
CO2: 29 mmol/L (ref 20–32)
Calcium: 10.2 mg/dL (ref 8.6–10.2)
Chloride: 100 mmol/L (ref 98–110)
Creat: 0.79 mg/dL (ref 0.50–0.97)
Globulin: 2.6 g/dL (calc) (ref 1.9–3.7)
Glucose, Bld: 143 mg/dL — ABNORMAL HIGH (ref 65–139)
Potassium: 4.4 mmol/L (ref 3.5–5.3)
Sodium: 141 mmol/L (ref 135–146)
Total Bilirubin: 0.5 mg/dL (ref 0.2–1.2)
Total Protein: 7.2 g/dL (ref 6.1–8.1)
eGFR: 101 mL/min/{1.73_m2} (ref >=60)

## 2022-11-16 LAB — SEDIMENTATION RATE: Sed Rate: 9 mm/h (ref 0–20)

## 2022-11-16 LAB — ANTI-NUCLEAR AB-TITER (ANA TITER): ANA Titer 1: 1:80 {titer} — ABNORMAL HIGH

## 2022-11-16 LAB — LIPID PANEL
Cholesterol: 235 mg/dL — ABNORMAL HIGH (ref <200)
HDL: 47 mg/dL — ABNORMAL LOW (ref >=50)
LDL Cholesterol (Calc): 152 mg/dL (calc) — ABNORMAL HIGH
Non-HDL Cholesterol (Calc): 188 mg/dL (calc) — ABNORMAL HIGH (ref <130)
Total CHOL/HDL Ratio: 5 (calc) — ABNORMAL HIGH (ref <5.0)
Triglycerides: 218 mg/dL — ABNORMAL HIGH (ref <150)

## 2022-11-16 LAB — ANA: Anti Nuclear Antibody (ANA): POSITIVE — AB

## 2022-11-16 LAB — HEMOGLOBIN A1C
Hgb A1c MFr Bld: 5.6 % of total Hgb (ref <5.7)
Mean Plasma Glucose: 114 mg/dL
eAG (mmol/L): 6.3 mmol/L

## 2022-11-16 LAB — C3 AND C4
C3 Complement: 183 mg/dL (ref 83–193)
C4 Complement: 38 mg/dL (ref 15–57)

## 2022-11-16 LAB — RHEUMATOID FACTOR: Rheumatoid fact SerPl-aCnc: <10 IU/mL (ref <14)

## 2022-11-16 LAB — LDL CHOLESTEROL, DIRECT: Direct LDL: 159 mg/dL — ABNORMAL HIGH (ref <100)

## 2022-11-16 LAB — C-REACTIVE PROTEIN: CRP: 3 mg/L (ref <8.0)

## 2022-11-29 ENCOUNTER — Encounter: Payer: Self-pay | Admitting: Internal Medicine

## 2022-12-05 ENCOUNTER — Telehealth: Payer: Self-pay | Admitting: Internal Medicine

## 2022-12-05 ENCOUNTER — Ambulatory Visit: Payer: Medicaid Other | Admitting: Internal Medicine

## 2022-12-05 NOTE — Telephone Encounter (Signed)
Pt came in the office for an appointment and was 12 mins late, she stated all she needed was to inform her PCP that she could not take the Olmesartan due to it making her drowsy.  She is currrently not taking any medications for her BP and would like to see if she can go back on her Lisinopril.

## 2022-12-05 NOTE — Telephone Encounter (Signed)
Mychart message sent to patient to schedule an appointment as this was what was advised when she was in the office.

## 2023-03-10 DIAGNOSIS — L821 Other seborrheic keratosis: Secondary | ICD-10-CM | POA: Diagnosis not present

## 2023-03-10 DIAGNOSIS — L814 Other melanin hyperpigmentation: Secondary | ICD-10-CM | POA: Diagnosis not present

## 2023-03-10 DIAGNOSIS — D235 Other benign neoplasm of skin of trunk: Secondary | ICD-10-CM | POA: Diagnosis not present

## 2023-04-06 ENCOUNTER — Encounter: Payer: Self-pay | Admitting: Nurse Practitioner

## 2023-04-06 ENCOUNTER — Telehealth: Payer: Medicaid Other

## 2023-04-06 DIAGNOSIS — I10 Essential (primary) hypertension: Secondary | ICD-10-CM | POA: Diagnosis not present

## 2023-04-06 MED ORDER — LISINOPRIL-HYDROCHLOROTHIAZIDE 20-25 MG PO TABS
1.0000 | ORAL_TABLET | Freq: Every day | ORAL | 0 refills | Status: DC
Start: 2023-04-06 — End: 2023-05-05

## 2023-04-06 NOTE — Progress Notes (Signed)
Virtual Visit Consent   Alexis Ellis, you are scheduled for a virtual visit with a [place] provider today. Just as with appointments in the office, your consent must be obtained to participate. Your consent will be active for this visit and any virtual visit you may have with one of our providers in the next 365 days. If you have a MyChart account, a copy of this consent can be sent to you electronically.  As this is a virtual visit, video technology does not allow for your provider to perform a traditional examination. This may limit your provider's ability to fully assess your condition. If your provider identifies any concerns that need to be evaluated in person or the need to arrange testing (such as labs, EKG, etc.), we will make arrangements to do so. Although advances in technology are sophisticated, we cannot ensure that it will always work on either your end or our end. If the connection with a video visit is poor, the visit may have to be switched to a telephone visit. With either a video or telephone visit, we are not always able to ensure that we have a secure connection.  By engaging in this virtual visit, you consent to the provision of healthcare and authorize for your insurance to be billed (if applicable) for the services provided during this visit. Depending on your insurance coverage, you may receive a charge related to this service.  I need to obtain your verbal consent now. Are you willing to proceed with your visit today? Alexis Ellis has provided verbal consent on 04/06/2023 for a virtual visit (video or telephone). Claiborne Rigg, NP  Date: 04/06/2023 10:11 AM  Virtual Visit via Video Note   I, Claiborne Rigg, connected with  Alexis Ellis  ([id number], 08-11-1990) on 04/06/23 at 10:00 AM EST by a video-enabled telemedicine application and verified that I am speaking with the correct person using two identifiers.  Location: Patient: Virtual Visit Location  Patient: Home Provider: Virtual Visit Location Provider: Home Office   I discussed the limitations of evaluation and management by telemedicine and the availability of in person appointments. The patient expressed understanding and agreed to proceed.    History of Present Illness: Alexis Ellis is a 32 y.o. who identifies as a female who was assigned female at birth, and is being seen today for HTN.  Ms. Zamarripa is requesting a refill of her blood pressure medication zestoretic 20-25mg  daily. She states she had to stop benicar that was sent as it was causing her drowsiness. BP today is 155/100. She states she was dismissed from her PCP office however I have instructed her she has an appt scheduled in December and I would encourage her to make sure she goes to her appointment for bloodwork and BP recheck. She states she was switched to benicar as she had complaints of cough with zestoretic however she is requesting today to go back on zestoretic instead.    Problems:  Patient Active Problem List   Diagnosis Date Noted   Mixed hyperlipidemia 10/24/2022   Chronic joint pain 04/22/2022   Anxiety 12/27/2020   Class 2 obesity due to excess calories with body mass index (BMI) of 38.0 to 38.9 in adult 12/27/2020   HTN (hypertension) 03/27/2020   ADD (attention deficit disorder) 11/07/2013    Allergies: No Known Allergies Medications:  Current Outpatient Medications:    lisinopril-hydrochlorothiazide (ZESTORETIC) 20-25 MG tablet, Take 1 tablet by mouth daily., Disp: 30 tablet, Rfl:  0   amphetamine-dextroamphetamine (ADDERALL) 20 MG tablet, Take 20 mg by mouth 3 (three) times daily., Disp: , Rfl:    celecoxib (CELEBREX) 50 MG capsule, Take 1 capsule (50 mg total) by mouth 2 (two) times daily., Disp: 180 capsule, Rfl: 1   clonazePAM (KLONOPIN) 1 MG tablet, Take 1 mg by mouth daily as needed for anxiety., Disp: , Rfl:    fluticasone (FLONASE) 50 MCG/ACT nasal spray, Place 2 sprays into both  nostrils daily., Disp: 16 g, Rfl: 6  Observations/Objective: Patient is well-developed, well-nourished in no acute distress.  Resting comfortably at home.  Head is normocephalic, atraumatic.  No labored breathing.  Speech is clear and coherent with logical content.  Patient is alert and oriented at baseline.    Assessment and Plan: 1. Primary hypertension - lisinopril-hydrochlorothiazide (ZESTORETIC) 20-25 MG tablet; Take 1 tablet by mouth daily.  Dispense: 30 tablet; Refill: 0  Follow up with PCP for December appointment  Follow Up Instructions: I discussed the assessment and treatment plan with the patient. The patient was provided an opportunity to ask questions and all were answered. The patient agreed with the plan and demonstrated an understanding of the instructions.  A copy of instructions were sent to the patient via MyChart unless otherwise noted below.    The patient was advised to call back or seek an in-person evaluation if the symptoms worsen or if the condition fails to improve as anticipated.    Claiborne Rigg, NP

## 2023-04-06 NOTE — Patient Instructions (Signed)
  Alexis Ellis, thank you for joining Claiborne Rigg, NP for today's virtual visit.  While this provider is not your primary care provider (PCP), if your PCP is located in our provider database this encounter information will be shared with them immediately following your visit.   A [facility] MyChart account gives you access to today's visit and all your visits, tests, and labs performed at [facility] " click here if you don't have a [facility] MyChart account or go to [email or web address]  Consent: (Patient) Alexis Ellis provided verbal consent for this virtual visit at the beginning of the encounter.  Current Medications:  Current Outpatient Medications:    lisinopril-hydrochlorothiazide (ZESTORETIC) 20-25 MG tablet, Take 1 tablet by mouth daily., Disp: 30 tablet, Rfl: 0   amphetamine-dextroamphetamine (ADDERALL) 20 MG tablet, Take 20 mg by mouth 3 (three) times daily., Disp: , Rfl:    celecoxib (CELEBREX) 50 MG capsule, Take 1 capsule (50 mg total) by mouth 2 (two) times daily., Disp: 180 capsule, Rfl: 1   clonazePAM (KLONOPIN) 1 MG tablet, Take 1 mg by mouth daily as needed for anxiety., Disp: , Rfl:    fluticasone (FLONASE) 50 MCG/ACT nasal spray, Place 2 sprays into both nostrils daily., Disp: 16 g, Rfl: 6   Medications ordered in this encounter:  Meds ordered this encounter  Medications   lisinopril-hydrochlorothiazide (ZESTORETIC) 20-25 MG tablet    Sig: Take 1 tablet by mouth daily.    Dispense:  30 tablet    Refill:  0    Order Specific Question:   Supervising Provider    Answer:   Merrilee Jansky [id number]     *If you need refills on other medications prior to your next appointment, please contact your pharmacy*  Follow-Up: Call back or seek an in-person evaluation if the symptoms worsen or if the condition fails to improve as anticipated.  [facility] [phone]  Other Instructions Follow up with PCP for December  appointment Needs BP check and labwork   If you have been instructed to have an in-person evaluation today at a local Urgent Care facility, please use the link below. It will take you to a list of all of our available [facility], including address, phone number and hours of operation. Please do not delay care.  [facility]  If you or a family member do not have a primary care provider, use the link below to schedule a visit and establish care. When you choose a [facility] primary care physician or advanced practice provider, you gain a long-term partner in health. Find a Primary Care Provider  Learn more about [facility]'s in-office and virtual care options: [facility] - Get Care Now

## 2023-04-08 ENCOUNTER — Telehealth: Payer: Medicaid Other | Admitting: Physician Assistant

## 2023-04-08 DIAGNOSIS — R3989 Other symptoms and signs involving the genitourinary system: Secondary | ICD-10-CM | POA: Diagnosis not present

## 2023-04-08 MED ORDER — CEPHALEXIN 500 MG PO CAPS: 500.0000 mg | ORAL_CAPSULE | Freq: Two times a day (BID) | ORAL | 0 refills | Status: AC

## 2023-04-08 NOTE — Progress Notes (Signed)
Virtual Visit Consent   Alexis Ellis, you are scheduled for a virtual visit with a [facility] provider today. Just as with appointments in the office, your consent must be obtained to participate. Your consent will be active for this visit and any virtual visit you may have with one of our providers in the next 365 days. If you have a MyChart account, a copy of this consent can be sent to you electronically.  As this is a virtual visit, video technology does not allow for your provider to perform a traditional examination. This may limit your provider's ability to fully assess your condition. If your provider identifies any concerns that need to be evaluated in person or the need to arrange testing (such as labs, EKG, etc.), we will make arrangements to do so. Although advances in technology are sophisticated, we cannot ensure that it will always work on either your end or our end. If the connection with a video visit is poor, the visit may have to be switched to a telephone visit. With either a video or telephone visit, we are not always able to ensure that we have a secure connection.  By engaging in this virtual visit, you consent to the provision of healthcare and authorize for your insurance to be billed (if applicable) for the services provided during this visit. Depending on your insurance coverage, you may receive a charge related to this service.  I need to obtain your verbal consent now. Are you willing to proceed with your visit today? Alexis Ellis has provided verbal consent on 04/08/2023 for a virtual visit (video or telephone). Alexis Ellis, [place]  Date: 04/08/2023 10:55 AM  Virtual Visit via Video Note   I, Alexis Ellis, connected with  ALIYAH HARTFORD  ([id number], May 14, 1990) on 04/08/23 at 11:00 AM EST by a video-enabled telemedicine application and verified that I am speaking with the correct person using two identifiers.  Location: Patient: Virtual Visit  Location Patient: Home Provider: Virtual Visit Location Provider: Home Office   I discussed the limitations of evaluation and management by telemedicine and the availability of in person appointments. The patient expressed understanding and agreed to proceed.    History of Present Illness: Alexis Ellis is a 32 y.o. who identifies as a female who was assigned female at birth, and is being seen today for possible UTI. Endorses 2-3 days of urgency, frequency and dysuria. Also noting a very small amount of blood in the urine. Denies fever, chills. Denies nausea/vomiting. Denies belly pain. Some low back pain. LMP -- 3.5 weeks ago.   HPI: HPI  Problems:  Patient Active Problem List   Diagnosis Date Noted   Mixed hyperlipidemia 10/24/2022   Chronic joint pain 04/22/2022   Anxiety 12/27/2020   Class 2 obesity due to excess calories with body mass index (BMI) of 38.0 to 38.9 in adult 12/27/2020   HTN (hypertension) 03/27/2020   ADD (attention deficit disorder) 11/07/2013    Allergies: No Known Allergies Medications:  Current Outpatient Medications:    cephALEXin (KEFLEX) 500 MG capsule, Take 1 capsule (500 mg total) by mouth 2 (two) times daily for 7 days., Disp: 14 capsule, Rfl: 0   amphetamine-dextroamphetamine (ADDERALL) 20 MG tablet, Take 20 mg by mouth 3 (three) times daily., Disp: , Rfl:    celecoxib (CELEBREX) 50 MG capsule, Take 1 capsule (50 mg total) by mouth 2 (two) times daily., Disp: 180 capsule, Rfl: 1   clonazePAM (KLONOPIN) 1 MG tablet,  Take 1 mg by mouth daily as needed for anxiety., Disp: , Rfl:    fluticasone (FLONASE) 50 MCG/ACT nasal spray, Place 2 sprays into both nostrils daily., Disp: 16 g, Rfl: 6   lisinopril-hydrochlorothiazide (ZESTORETIC) 20-25 MG tablet, Take 1 tablet by mouth daily., Disp: 30 tablet, Rfl: 0  Observations/Objective: Patient is well-developed, well-nourished in no acute distress.  Resting comfortably at home.  Head is normocephalic, atraumatic.   No labored breathing. Speech is clear and coherent with logical content.  Patient is alert and oriented at baseline.   Assessment and Plan: 1. Suspected UTI - cephALEXin (KEFLEX) 500 MG capsule; Take 1 capsule (500 mg total) by mouth 2 (two) times daily for 7 days.  Dispense: 14 capsule; Refill: 0  Classic UTI symptoms with absence of alarm signs or symptoms. Prior history of UTI. Will treat empirically with Keflex for suspected uncomplicated cystitis. Supportive measures and OTC medications reviewed. Strict in-person evaluation precautions discussed.    Follow Up Instructions: I discussed the assessment and treatment plan with the patient. The patient was provided an opportunity to ask questions and all were answered. The patient agreed with the plan and demonstrated an understanding of the instructions.  A copy of instructions were sent to the patient via MyChart unless otherwise noted below.   The patient was advised to call back or seek an in-person evaluation if the symptoms worsen or if the condition fails to improve as anticipated.    Alexis Climes, PA-C

## 2023-04-08 NOTE — Patient Instructions (Addendum)
Darcella Gasman, thank you for joining Piedad Climes, PA-C for today's virtual visit.  While this provider is not your primary care provider (PCP), if your PCP is located in our provider database this encounter information will be shared with them immediately following your visit.   A [facility] MyChart account gives you access to today's visit and all your visits, tests, and labs performed at [facility] " click here if you don't have a [facility] MyChart account or go to [email or web address]  Consent: (Patient) Darcella Gasman provided verbal consent for this virtual visit at the beginning of the encounter.  Current Medications:  Current Outpatient Medications:    amphetamine-dextroamphetamine (ADDERALL) 20 MG tablet, Take 20 mg by mouth 3 (three) times daily., Disp: , Rfl:    celecoxib (CELEBREX) 50 MG capsule, Take 1 capsule (50 mg total) by mouth 2 (two) times daily., Disp: 180 capsule, Rfl: 1   clonazePAM (KLONOPIN) 1 MG tablet, Take 1 mg by mouth daily as needed for anxiety., Disp: , Rfl:    fluticasone (FLONASE) 50 MCG/ACT nasal spray, Place 2 sprays into both nostrils daily., Disp: 16 g, Rfl: 6   lisinopril-hydrochlorothiazide (ZESTORETIC) 20-25 MG tablet, Take 1 tablet by mouth daily., Disp: 30 tablet, Rfl: 0   Medications ordered in this encounter:  No orders of the defined types were placed in this encounter.    *If you need refills on other medications prior to your next appointment, please contact your pharmacy*  Follow-Up: Call back or seek an in-person evaluation if the symptoms worsen or if the condition fails to improve as anticipated.  [facility] [phone]  Other Instructions Your symptoms are consistent with a bladder infection, also called acute cystitis. Please take your antibiotic (Keflex) as directed until all pills are gone.  Stay very well hydrated.  Consider a daily probiotic (Align, Culturelle, or Activia) to help  prevent stomach upset caused by the antibiotic.  Taking a probiotic daily may also help prevent recurrent UTIs.  Also consider taking AZO (Phenazopyridine) tablets to help decrease pain with urination.   Urinary Tract Infection A urinary tract infection (UTI) can occur any place along the urinary tract. The tract includes the kidneys, ureters, bladder, and urethra. A type of germ called bacteria often causes a UTI. UTIs are often helped with antibiotic medicine.  HOME CARE  If given, take antibiotics as told by your doctor. Finish them even if you start to feel better. Drink enough fluids to keep your pee (urine) clear or pale yellow. Avoid tea, drinks with caffeine, and bubbly (carbonated) drinks. Pee often. Avoid holding your pee in for a long time. Pee before and after having sex (intercourse). Wipe from front to back after you poop (bowel movement) if you are a woman. Use each tissue only once. GET HELP RIGHT AWAY IF:  You have back pain. You have lower belly (abdominal) pain. You have chills. You feel sick to your stomach (nauseous). You throw up (vomit). Your burning or discomfort with peeing does not go away. You have a fever. Your symptoms are not better in 3 days. MAKE SURE YOU:  Understand these instructions. Will watch your condition. Will get help right away if you are not doing well or get worse. Document Released: 10/16/2007 Document Revised: 01/22/2012 Document Reviewed: 11/28/2011 [facility] Patient Information 2015 [place], [place]. This information is not intended to replace advice given to you by your health care provider. Make sure you discuss any questions you have  with your health care provider.    If you have been instructed to have an in-person evaluation today at a local Urgent Care facility, please use the link below. It will take you to a list of all of our available [facility], including address, phone number and hours of operation. Please do not  delay care.  [facility]  If you or a family member do not have a primary care provider, use the link below to schedule a visit and establish care. When you choose a [facility] primary care physician or advanced practice provider, you gain a long-term partner in health. Find a Primary Care Provider  Learn more about [facility]'s in-office and virtual care options: [facility] - Get Care Now

## 2023-04-24 DIAGNOSIS — G5602 Carpal tunnel syndrome, left upper limb: Secondary | ICD-10-CM | POA: Insufficient documentation

## 2023-04-24 DIAGNOSIS — G5603 Carpal tunnel syndrome, bilateral upper limbs: Secondary | ICD-10-CM | POA: Diagnosis not present

## 2023-04-24 DIAGNOSIS — M25531 Pain in right wrist: Secondary | ICD-10-CM | POA: Insufficient documentation

## 2023-04-28 ENCOUNTER — Encounter: Payer: Medicaid Other | Admitting: Internal Medicine

## 2023-04-29 ENCOUNTER — Other Ambulatory Visit: Payer: Self-pay | Admitting: Nurse Practitioner

## 2023-04-29 DIAGNOSIS — I10 Essential (primary) hypertension: Secondary | ICD-10-CM

## 2023-05-05 ENCOUNTER — Ambulatory Visit (INDEPENDENT_AMBULATORY_CARE_PROVIDER_SITE_OTHER): Payer: Medicaid Other | Admitting: Internal Medicine

## 2023-05-05 VITALS — BP 130/82 | Ht 65.0 in | Wt 226.8 lb

## 2023-05-05 DIAGNOSIS — R739 Hyperglycemia, unspecified: Secondary | ICD-10-CM

## 2023-05-05 DIAGNOSIS — Z0001 Encounter for general adult medical examination with abnormal findings: Secondary | ICD-10-CM

## 2023-05-05 DIAGNOSIS — Z6837 Body mass index (BMI) 37.0-37.9, adult: Secondary | ICD-10-CM | POA: Diagnosis not present

## 2023-05-05 DIAGNOSIS — E6609 Other obesity due to excess calories: Secondary | ICD-10-CM

## 2023-05-05 DIAGNOSIS — E66812 Obesity, class 2: Secondary | ICD-10-CM

## 2023-05-05 DIAGNOSIS — E782 Mixed hyperlipidemia: Secondary | ICD-10-CM

## 2023-05-05 DIAGNOSIS — I10 Essential (primary) hypertension: Secondary | ICD-10-CM

## 2023-05-05 MED ORDER — LISINOPRIL-HYDROCHLOROTHIAZIDE 20-25 MG PO TABS
1.0000 | ORAL_TABLET | Freq: Every day | ORAL | 1 refills | Status: DC
Start: 2023-05-05 — End: 2024-02-29

## 2023-05-05 NOTE — Assessment & Plan Note (Signed)
Encourage diet and exercise for weight loss 

## 2023-05-05 NOTE — Progress Notes (Signed)
Subjective:    Patient ID: Alexis Ellis, female    DOB: 1991/04/23, 32 y.o.   MRN: [id number]  HPI  Patient presents to clinic today for her annual exam.    Flu: 12/2018 Tetanus: 10/2021 COVID: never Pap smear: 04/2021 Dentist: as needed  Diet: She does eat meat. She consumes fruits and veggies. She tries to avoid fried foods. She drinks mostly sweet tea and water. Exercise: Walking with kids.  Review of Systems     Past Medical History:  Diagnosis Date   ADD (attention deficit disorder)    Chicken pox    Depression    Diet controlled gestational diabetes mellitus (GDM) in third trimester 03/23/2019   Ectopic pregnancy    Elevated blood pressure    Elevated cholesterol    Frequent headaches    Gestational diabetes    Gestational hypertension 04/19/2019   History of gestational diabetes in prior pregnancy, currently pregnant 07/06/2021   History of kidney stones    HLD (hyperlipidemia) 09/29/2015   Pregnancy induced hypertension    Status post emergency cesarean section 01/03/2022    Current Outpatient Medications  Medication Sig Dispense Refill   amphetamine-dextroamphetamine (ADDERALL) 20 MG tablet Take 20 mg by mouth 3 (three) times daily.     celecoxib (CELEBREX) 50 MG capsule Take 1 capsule (50 mg total) by mouth 2 (two) times daily. 180 capsule 1   clonazePAM (KLONOPIN) 1 MG tablet Take 1 mg by mouth daily as needed for anxiety.     fluticasone (FLONASE) 50 MCG/ACT nasal spray Place 2 sprays into both nostrils daily. 16 g 6   lisinopril-hydrochlorothiazide (ZESTORETIC) 20-25 MG tablet Take 1 tablet by mouth daily. 30 tablet 0   No current facility-administered medications for this visit.    No Known Allergies  Family History  Problem Relation Age of Onset   Hypertension Mother        Living   Fibromyalgia Mother    Diabetes Father 35       Deceased   Heart attack Father    Sudden death Father    Bipolar disorder Maternal Grandmother    Diabetes  Maternal Grandfather    Alcohol abuse Paternal Grandfather    Liver disease Paternal Grandfather    Hyperlipidemia Paternal Grandmother    Kidney disease Paternal Grandmother    Liver disease Paternal Grandmother    Heart disease Paternal Grandmother    Healthy Sister    Healthy Son     Social History   Socioeconomic History   Marital status: Single    Spouse name: Not on file   Number of children: Not on file   Years of education: Not on file   Highest education level: Some college, no degree  Occupational History   Not on file  Tobacco Use   Smoking status: Former    Current packs/day: 0.00    Types: Cigarettes    Quit date: 2020    Years since quitting: 4.9   Smokeless tobacco: Never  Vaping Use   Vaping status: Never Used  Substance and Sexual Activity   Alcohol use: Not Currently    Comment: ocassional   Drug use: No   Sexual activity: Not Currently    Partners: Male  Other Topics Concern   Not on file  Social History Narrative   Not on file   Social Drivers of Health   Financial Resource Strain: Patient Declined (10/23/2022)   Overall Financial Resource Strain (CARDIA)    Difficulty of Paying  Living Expenses: Patient declined  Food Insecurity: No Food Insecurity (10/23/2022)   Hunger Vital Sign    Worried About Running Out of Food in the Last Year: Never true    Ran Out of Food in the Last Year: Never true  Transportation Needs: No Transportation Needs (10/23/2022)   PRAPARE - Administrator, Civil Service (Medical): No    Lack of Transportation (Non-Medical): No  Physical Activity: Insufficiently Active (10/23/2022)   Exercise Vital Sign    Days of Exercise per Week: 5 days    Minutes of Exercise per Session: 10 min  Stress: Stress Concern Present (10/23/2022)   [employer] of Occupational Health - Occupational Stress Questionnaire    Feeling of Stress : Very much  Social Connections: Moderately Isolated (10/23/2022)   Social Connection  and Isolation Panel [NHANES]    Frequency of Communication with Friends and Family: More than three times a week    Frequency of Social Gatherings with Friends and Family: Once a week    Attends Religious Services: 1 to 4 times per year    Active Member of [employer] or Organizations: No    Attends Engineer, structural: Not on file    Marital Status: Never married  Intimate Partner Violence: Not on file     Constitutional: Denies fever, malaise, fatigue, headache or abrupt weight changes.  HEENT: Denies eye pain, eye redness, ear pain, ringing in the ears, wax buildup, runny nose, nasal congestion, bloody nose, or sore throat. Respiratory: Denies difficulty breathing, shortness of breath, cough or sputum production.   Cardiovascular: Denies chest pain, chest tightness, palpitations or swelling in the hands or feet.  Gastrointestinal: Pt reports intermittent constipation. Denies abdominal pain, bloating, diarrhea or blood in the stool.  GU: Denies urgency, frequency, pain with urination, burning sensation, blood in urine, odor or discharge. Musculoskeletal: Patient reports upper back pain, chronic joint pain.  Denies decrease in range of motion, difficulty with gait, or joint swelling.  Skin: Denies redness, rashes, lesions or ulcercations.  Neurological: Patient reports inattention. Denies dizziness, difficulty with memory, difficulty with speech or problems with balance and coordination.  Psych: Patient has a history of anxiety.  Denies depression, SI/HI.  No other specific complaints in a complete review of systems (except as listed in HPI above).  Objective:   Physical Exam BP 130/82 (BP Location: Left Arm, Patient Position: Sitting, Cuff Size: Large)   Ht 5\' 5"  (1.651 m)   Wt 226 lb 12.8 oz (102.9 kg)   LMP 04/21/2023 (Approximate)   BMI 37.74 kg/m    Wt Readings from Last 3 Encounters:  11/12/22 234 lb (106.1 kg)  10/24/22 237 lb (107.5 kg)  07/15/22 237 lb (107.5 kg)     General: Appears her stated age, obese in NAD. Skin: Warm, dry and intact.  HEENT: Head: normal shape and size; Eyes: sclera white, no icterus, conjunctiva pink, PERRLA and EOMs intact;  Neck:  Neck supple, trachea midline. No masses, lumps or thyromegaly present.  Cardiovascular: Normal rate and rhythm. S1,S2 noted.  No murmur, rubs or gallops noted. No JVD or BLE edema.  Pulmonary/Chest: Normal effort and positive vesicular breath sounds. No respiratory distress. No wheezes, rales or ronchi noted.  Abdomen: Normal bowel sounds.  Musculoskeletal: Strength 5/5 BUE/BLE.  No difficulty with gait.  Neurological: Alert and oriented. Cranial nerves II-XII grossly intact. Coordination normal.  Psychiatric: Mood and affect normal.  Anxious appearing. Judgment and thought content normal.   BMET  Component Value Date/Time   NA 141 11/12/2022 1117   NA 139 02/26/2022 1133   K 4.4 11/12/2022 1117   CL 100 11/12/2022 1117   CO2 29 11/12/2022 1117   GLUCOSE 143 (H) 11/12/2022 1117   BUN 17 11/12/2022 1117   BUN 12 02/26/2022 1133   CREATININE 0.79 11/12/2022 1117   CALCIUM 10.2 11/12/2022 1117   GFRNONAA >60 01/02/2022 1850   GFRAA >60 05/01/2019 1723    Lipid Panel     Component Value Date/Time   CHOL 235 (H) 11/12/2022 1117   TRIG 218 (H) 11/12/2022 1117   HDL 47 (L) 11/12/2022 1117   CHOLHDL 5.0 (H) 11/12/2022 1117   VLDL 26.8 03/27/2020 1227   LDLCALC 152 (H) 11/12/2022 1117    CBC    Component Value Date/Time   WBC 8.2 11/12/2022 1117   RBC 5.24 (H) 11/12/2022 1117   HGB 15.2 11/12/2022 1117   HGB 12.1 02/26/2022 1133   HCT 44.9 11/12/2022 1117   HCT 38.1 02/26/2022 1133   PLT 316 11/12/2022 1117   PLT 307 02/26/2022 1133   MCV 85.7 11/12/2022 1117   MCV 77 (L) 02/26/2022 1133   MCH 29.0 11/12/2022 1117   MCHC 33.9 11/12/2022 1117   RDW 13.1 11/12/2022 1117   RDW 13.8 02/26/2022 1133   LYMPHSABS 2.4 07/06/2021 1058   MONOABS 0.5 12/26/2020 1426   EOSABS 0.1  07/06/2021 1058   BASOSABS 0.1 07/06/2021 1058    Hgb A1C Lab Results  Component Value Date   HGBA1C 5.6 11/12/2022           Assessment & Plan:   Preventative Health Maintenance:  She declines flu shot Tetanus UTD Encouraged her to get her COVID-vaccine Pap smear UTD Encouraged her to consume a balanced diet and exercise regimen Advised her to see a dentist annually We will check CBC, c-Met, lipid, A1c  RTC in 6 months, follow-up chronic conditions Nicki Reaper, NP

## 2023-05-05 NOTE — Patient Instructions (Signed)

## 2023-05-06 LAB — COMPLETE METABOLIC PANEL WITH GFR
AG Ratio: 1.7 (calc) (ref 1.0–2.5)
ALT: 27 U/L (ref 6–29)
AST: 19 U/L (ref 10–30)
Albumin: 4.5 g/dL (ref 3.6–5.1)
Alkaline phosphatase (APISO): 83 U/L (ref 31–125)
BUN: 13 mg/dL (ref 7–25)
CO2: 31 mmol/L (ref 20–32)
Calcium: 9.8 mg/dL (ref 8.6–10.2)
Chloride: 102 mmol/L (ref 98–110)
Creat: 0.74 mg/dL (ref 0.50–0.97)
Globulin: 2.7 g/dL (ref 1.9–3.7)
Glucose, Bld: 78 mg/dL (ref 65–139)
Potassium: 4.7 mmol/L (ref 3.5–5.3)
Sodium: 140 mmol/L (ref 135–146)
Total Bilirubin: 0.4 mg/dL (ref 0.2–1.2)
Total Protein: 7.2 g/dL (ref 6.1–8.1)
eGFR: 109 mL/min/{1.73_m2} (ref >=60)

## 2023-05-06 LAB — CBC
HCT: 46 % — ABNORMAL HIGH (ref 35.0–45.0)
Hemoglobin: 15.4 g/dL (ref 11.7–15.5)
MCH: 28.8 pg (ref 27.0–33.0)
MCHC: 33.5 g/dL (ref 32.0–36.0)
MCV: 86 fL (ref 80.0–100.0)
MPV: 9.9 fL (ref 7.5–12.5)
Platelets: 350 10*3/uL (ref 140–400)
RBC: 5.35 10*6/uL — ABNORMAL HIGH (ref 3.80–5.10)
RDW: 12.6 % (ref 11.0–15.0)
WBC: 8.9 10*3/uL (ref 3.8–10.8)

## 2023-05-06 LAB — LIPID PANEL
Cholesterol: 215 mg/dL — ABNORMAL HIGH (ref <200)
HDL: 45 mg/dL — ABNORMAL LOW (ref >=50)
LDL Cholesterol (Calc): 143 mg/dL — ABNORMAL HIGH
Non-HDL Cholesterol (Calc): 170 mg/dL — ABNORMAL HIGH (ref <130)
Total CHOL/HDL Ratio: 4.8 (calc) (ref <5.0)
Triglycerides: 143 mg/dL (ref <150)

## 2023-05-06 LAB — HEMOGLOBIN A1C
Hgb A1c MFr Bld: 5.4 %{Hb} (ref <5.7)
Mean Plasma Glucose: 108 mg/dL
eAG (mmol/L): 6 mmol/L

## 2023-05-08 ENCOUNTER — Encounter: Payer: Self-pay | Admitting: Internal Medicine

## 2023-05-08 DIAGNOSIS — E782 Mixed hyperlipidemia: Secondary | ICD-10-CM

## 2023-05-08 MED ORDER — SIMVASTATIN 20 MG PO TABS: 20.0000 mg | ORAL_TABLET | Freq: Every day | ORAL | 1 refills | Status: DC

## 2023-05-15 ENCOUNTER — Encounter: Payer: Self-pay | Admitting: Internal Medicine

## 2023-05-15 ENCOUNTER — Telehealth: Payer: Medicaid Other | Admitting: Internal Medicine

## 2023-05-15 DIAGNOSIS — R21 Rash and other nonspecific skin eruption: Secondary | ICD-10-CM

## 2023-05-15 MED ORDER — NYSTATIN 100000 UNIT/GM EX POWD: 1.0000 | Freq: Three times a day (TID) | CUTANEOUS | 0 refills | Status: DC

## 2023-05-15 NOTE — Patient Instructions (Signed)
 Intertrigo Intertrigo is skin irritation (inflammation) that happens in warm, moist areas of the body. The irritation can cause a rash and make skin raw and itchy. The rash is usually pink or red. It happens mostly between folds of skin or where skin rubs together, such as: In the armpits. Under the breasts. Under the belly. In the groin area. Around the butt area. Between the toes. This condition is not passed from person to person. What are the causes? Heat, moisture, rubbing, and not enough air movement. The condition can be made worse by: Sweat. Bacteria. A fungus, such as yeast. What increases the risk? Moisture in your skin folds. You are more likely to develop this condition if you: Are not able to move around. Live in a warm and moist climate. Are not able to control your pee (urine) or poop (stool). Wear splints, braces, or other medical devices. Are overweight. Have diabetes. What are the signs or symptoms? A pink or red skin rash in a skin fold or near a skin fold. Raw or scaly skin. Itching. A burning feeling. Bleeding. Leaking fluid. A bad smell. How is this treated? Cleaning and drying your skin. Taking an antibiotic medicine or using an antibiotic skin cream for a bacterial infection. Using an antifungal cream on your skin or taking pills for an infection that was caused by a fungus, such as yeast. Using a steroid ointment to stop the itching and irritation. Separating the skin fold with a clean cotton cloth to absorb moisture and allow air to flow into the area. Follow these instructions at home: Keep the affected area clean and dry. Do not scratch your skin. Stay cool as much as you can. Use an air conditioner or a fan, if you have one. Apply over-the-counter and prescription medicines only as told by your doctor. If you were prescribed antibiotics, use them as told by your doctor. Do not stop using the antibiotic even if you start to feel better. Keep all  follow-up visits. Your doctor may need to check your skin to make sure that the treatment is working. How is this prevented? Shower and dry yourself well after being active. Use a hair dryer on a cool setting to dry between skin folds. Do not wear tight clothes. Wear clothes that: Are loose. Take moisture away from your body. Are made of cotton. Wear a bra that gives good support, if needed. Protect the skin in your groin and butt area as told by your doctor. To do this: Follow a regular cleaning routine. Use creams, powders, or ointments that protect your skin. Change protection pads often. Stay at a healthy weight. Take care of your feet. This is very important if you have diabetes. You should: Wear shoes that fit well. Keep your feet dry. Wear clean cotton or wool socks. Keep your blood sugar under control if you have diabetes. Contact a doctor if: Your symptoms do not get better with treatment. Your symptoms get worse or they spread. You notice more redness and warmth. You have a fever. This information is not intended to replace advice given to you by your health care provider. Make sure you discuss any questions you have with your health care provider. Document Revised: 09/20/2021 Document Reviewed: 09/20/2021 Elsevier Patient Education  2024 [employer].

## 2023-05-15 NOTE — Progress Notes (Signed)
 Virtual Visit via Video Note  I connected with Alexis Ellis on 05/15/23 at 11:20 AM EST by a video enabled telemedicine application and verified that I am speaking with the correct person using two identifiers.  Location: Patient: Home Provider: Office  Persons participating in this video call: Angeline Laura, NP in Alexis Ellis   I discussed the limitations of evaluation and management by telemedicine and the availability of in person appointments. The patient expressed understanding and agreed to proceed.  History of Present Illness:   Discussed the use of AI scribe software for clinical note transcription with the patient, who gave verbal consent to proceed.  The patient presents with a recurrent, painful rash in the groin area. The rash, which was initially associated with a foul odor, has significantly improved and is no longer malodorous. The patient describes the rash as extremely red and moist, but denies any bleeding or blistering. She reports a similar episode in the past that resolved spontaneously. 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      Past Medical History:  Diagnosis Date   ADD (attention deficit disorder)    Chicken pox    Depression    Diet controlled gestational diabetes mellitus (GDM) in third trimester 03/23/2019   Ectopic pregnancy    Elevated blood pressure    Elevated cholesterol    Frequent headaches    Gestational diabetes    Gestational hypertension 04/19/2019   History of gestational diabetes in prior pregnancy, currently pregnant 07/06/2021   History of kidney stones    HLD (hyperlipidemia) 09/29/2015   Pregnancy induced hypertension    Status post emergency cesarean section 01/03/2022    Current Outpatient Medications  Medication Sig Dispense Refill   amphetamine -dextroamphetamine  (ADDERALL) 20 MG tablet  Take 20 mg by mouth 3 (three) times daily.     clonazePAM (KLONOPIN) 1 MG tablet Take 1 mg by mouth daily as needed for anxiety.     fluticasone  (FLONASE ) 50 MCG/ACT nasal spray Place 2 sprays into both nostrils daily. 16 g 6   lisinopril -hydrochlorothiazide  (ZESTORETIC ) 20-25 MG tablet Take 1 tablet by mouth daily. 90 tablet 1   simvastatin  (ZOCOR ) 20 MG tablet Take 1 tablet (20 mg total) by mouth at bedtime. 90 tablet 1   No current facility-administered medications for this visit.    No Known Allergies  Family History  Problem Relation Age of Onset   Hypertension Mother        Living   Fibromyalgia Mother    Diabetes Father 70       Deceased   Heart attack Father    Sudden death Father    Bipolar disorder Maternal Grandmother    Diabetes Maternal Grandfather    Alcohol abuse Paternal Grandfather    Liver disease Paternal Grandfather    Hyperlipidemia Paternal Grandmother    Kidney disease Paternal Grandmother    Liver disease Paternal Grandmother    Heart disease Paternal Grandmother    Healthy Sister    Healthy Son     Social History   Socioeconomic History   Marital status: Single    Spouse name: Not on file   Number of children: Not on file   Years of education: Not on file   Highest education level: Some college, no degree  Occupational History   Not on file  Tobacco Use   Smoking status: Former  Current packs/day: 0.00    Types: Cigarettes    Quit date: 2020    Years since quitting: 5.0   Smokeless tobacco: Never  Vaping Use   Vaping status: Never Used  Substance and Sexual Activity   Alcohol use: Not Currently    Comment: ocassional   Drug use: No   Sexual activity: Not Currently    Partners: Male  Other Topics Concern   Not on file  Social History Narrative   Not on file   Social Drivers of Health   Financial Resource Strain: High Risk (05/05/2023)   Overall Financial Resource Strain (CARDIA)    Difficulty of Paying Living Expenses: Very  hard  Food Insecurity: Unknown (05/05/2023)   Hunger Vital Sign    Worried About Running Out of Food in the Last Year: Never true    Ran Out of Food in the Last Year: Patient declined  Transportation Needs: No Transportation Needs (05/05/2023)   PRAPARE - Administrator, Civil Service (Medical): No    Lack of Transportation (Non-Medical): No  Physical Activity: Sufficiently Active (05/05/2023)   Exercise Vital Sign    Days of Exercise per Week: 5 days    Minutes of Exercise per Session: 30 min  Stress: Stress Concern Present (05/05/2023)   [employer] of Occupational Health - Occupational Stress Questionnaire    Feeling of Stress : Rather much  Social Connections: Moderately Isolated (05/05/2023)   Social Connection and Isolation Panel [NHANES]    Frequency of Communication with Friends and Family: Twice a week    Frequency of Social Gatherings with Friends and Family: Once a week    Attends Religious Services: 1 to 4 times per year    Active Member of [employer] or Organizations: No    Attends Engineer, Structural: Not on file    Marital Status: Never married  Intimate Partner Violence: Not on file     Constitutional: Denies fever, malaise, fatigue, headache or abrupt weight changes.  Respiratory: Denies difficulty breathing, shortness of breath, cough or sputum production.   Cardiovascular: Denies chest pain, chest tightness, palpitations or swelling in the hands or feet.  Musculoskeletal: Denies decrease in range of motion, difficulty with gait, muscle pain or joint pain and swelling.  Skin: Patient reports rash of groin.  Denies redness, lesions or ulcercations.    No other specific complaints in a complete review of systems (except as listed in HPI above).  Observations/Objective:  LMP 04/21/2023 (Approximate)  Wt Readings from Last 3 Encounters:  05/05/23 226 lb 12.8 oz (102.9 kg)  11/12/22 234 lb (106.1 kg)  10/24/22 237 lb (107.5 kg)     General: Appears her stated age, obese, in NAD. Skin:     Pulmonary/Chest: Normal effort. No respiratory distress.  Neurological: Alert and oriented.   BMET    Component Value Date/Time   NA 140 05/05/2023 1437   NA 139 02/26/2022 1133   K 4.7 05/05/2023 1437   CL 102 05/05/2023 1437   CO2 31 05/05/2023 1437   GLUCOSE 78 05/05/2023 1437   BUN 13 05/05/2023 1437   BUN 12 02/26/2022 1133   CREATININE 0.74 05/05/2023 1437   CALCIUM  9.8 05/05/2023 1437   GFRNONAA >60 01/02/2022 1850   GFRAA >60 05/01/2019 1723    Lipid Panel     Component Value Date/Time   CHOL 215 (H) 05/05/2023 1437   TRIG 143 05/05/2023 1437   HDL 45 (L) 05/05/2023 1437   CHOLHDL 4.8 05/05/2023 1437  VLDL 26.8 03/27/2020 1227   LDLCALC 143 (H) 05/05/2023 1437    CBC    Component Value Date/Time   WBC 8.9 05/05/2023 1437   RBC 5.35 (H) 05/05/2023 1437   HGB 15.4 05/05/2023 1437   HGB 12.1 02/26/2022 1133   HCT 46.0 (H) 05/05/2023 1437   HCT 38.1 02/26/2022 1133   PLT 350 05/05/2023 1437   PLT 307 02/26/2022 1133   MCV 86.0 05/05/2023 1437   MCV 77 (L) 02/26/2022 1133   MCH 28.8 05/05/2023 1437   MCHC 33.5 05/05/2023 1437   RDW 12.6 05/05/2023 1437   RDW 13.8 02/26/2022 1133   LYMPHSABS 2.4 07/06/2021 1058   MONOABS 0.5 12/26/2020 1426   EOSABS 0.1 07/06/2021 1058   BASOSABS 0.1 07/06/2021 1058    Hgb A1C Lab Results  Component Value Date   HGBA1C 5.4 05/05/2023       Assessment and Plan:  Assessment and Plan    Groin Rash Painful, red, malodorous rash in the groin area. Patient reports a family history of Hailey-Hailey disease. Rash has improved but still present. -Send pictures of rash via MyChart for further evaluation. -Prescribe Nystatin  powder for symptomatic relief. -Consider referral to dermatology for biopsy and definitive diagnosis if rash recurs.   RTC in 5 months for follow-up of chronic conditions  Follow Up Instructions:    I discussed the  assessment and treatment plan with the patient. The patient was provided an opportunity to ask questions and all were answered. The patient agreed with the plan and demonstrated an understanding of the instructions.   The patient was advised to call back or seek an in-person evaluation if the symptoms worsen or if the condition fails to improve as anticipated.    Angeline Laura, NP

## 2023-05-16 DIAGNOSIS — G5603 Carpal tunnel syndrome, bilateral upper limbs: Secondary | ICD-10-CM | POA: Diagnosis not present

## 2023-06-13 DIAGNOSIS — F411 Generalized anxiety disorder: Secondary | ICD-10-CM | POA: Diagnosis not present

## 2023-06-19 DIAGNOSIS — G5603 Carpal tunnel syndrome, bilateral upper limbs: Secondary | ICD-10-CM | POA: Diagnosis not present

## 2023-06-27 DIAGNOSIS — F411 Generalized anxiety disorder: Secondary | ICD-10-CM | POA: Diagnosis not present

## 2023-07-07 DIAGNOSIS — Z4789 Encounter for other orthopedic aftercare: Secondary | ICD-10-CM | POA: Insufficient documentation

## 2023-07-07 DIAGNOSIS — G5601 Carpal tunnel syndrome, right upper limb: Secondary | ICD-10-CM | POA: Diagnosis not present

## 2023-07-22 IMAGING — US US MFM OB FOLLOW-UP
1 series · 13 of 28 positions shown · non-contrast
Comparison: none

[Series 1: us mfm ob follow-up · 13 of 54 slices shown]
[im 2/54]
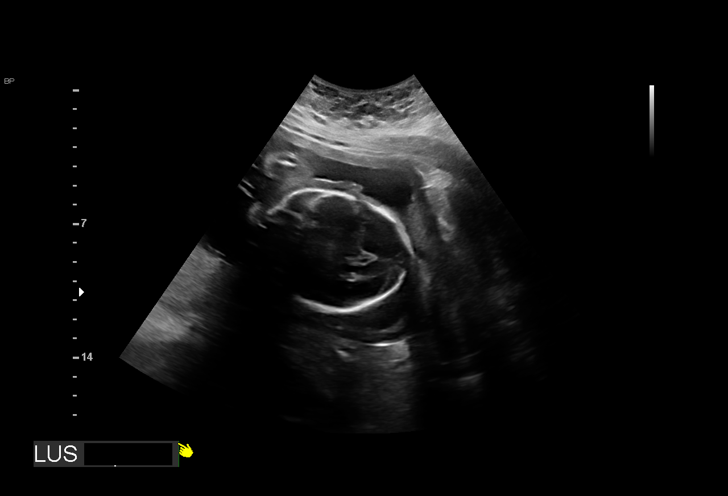
[im 6/54]
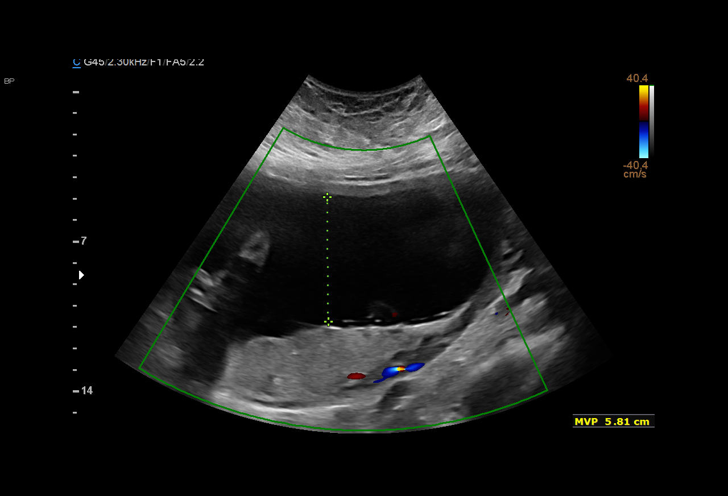
[im 10/54]
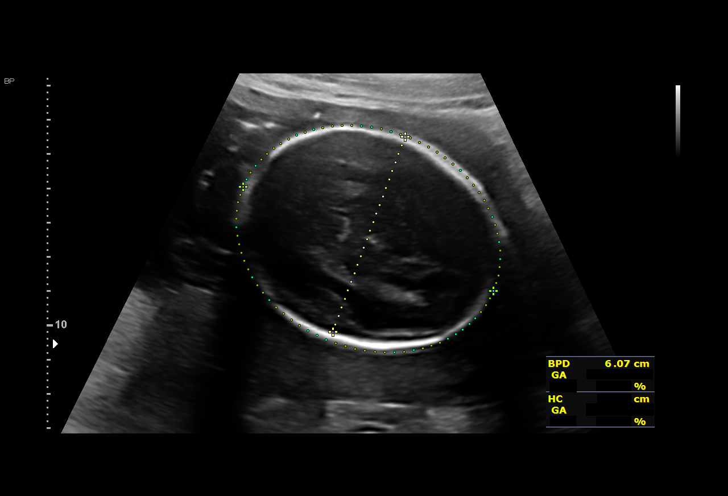
[im 14/54]
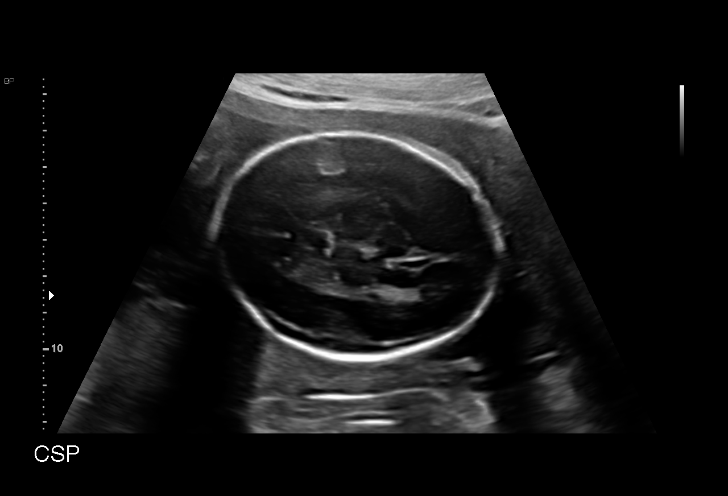
[im 18/54]
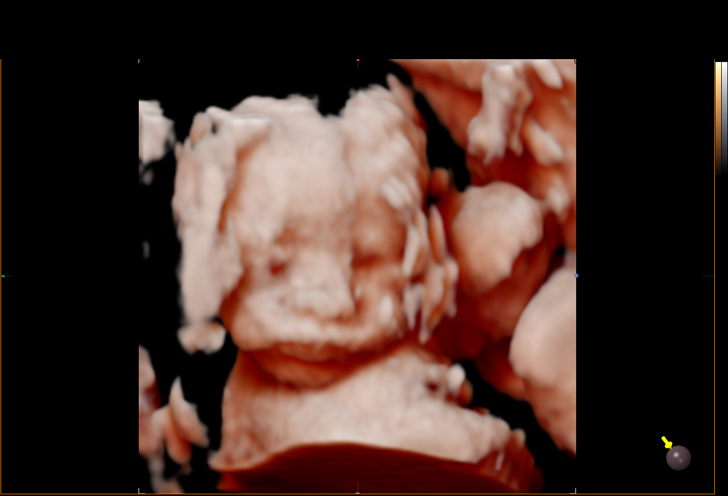
[im 22/54]
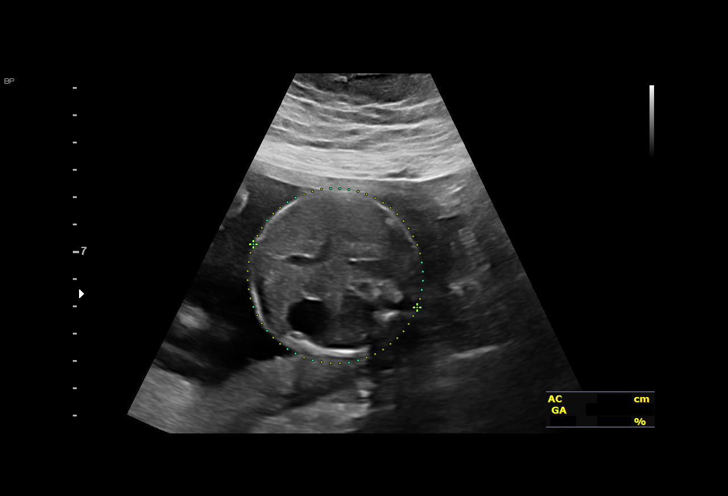
[im 28/54]
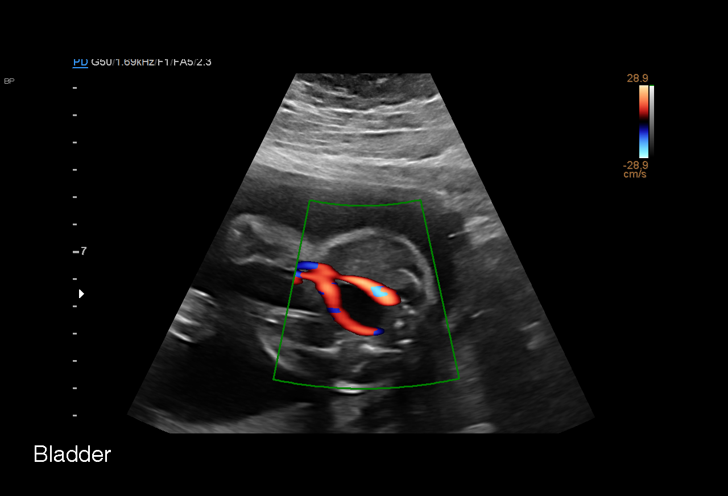
[im 32/54]
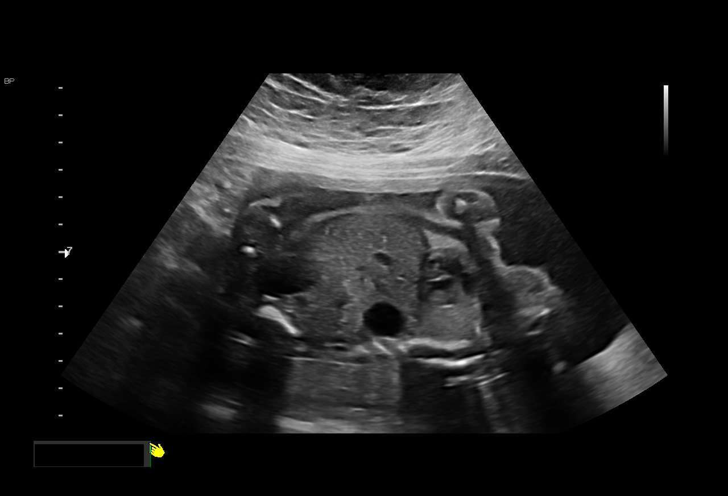
[im 36/54]
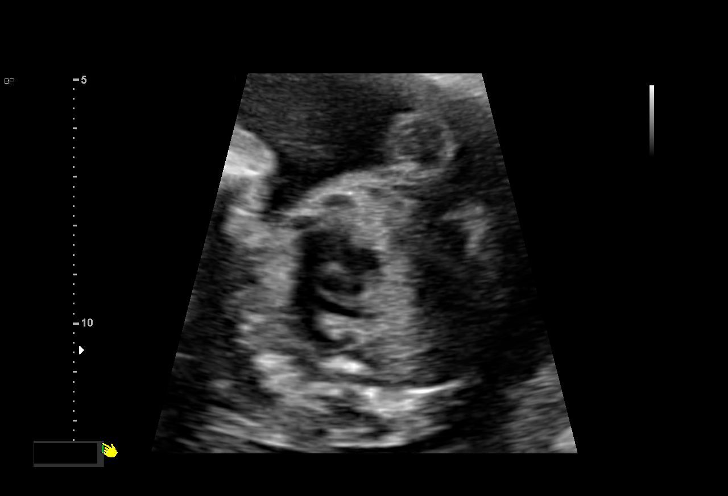
[im 40/54]
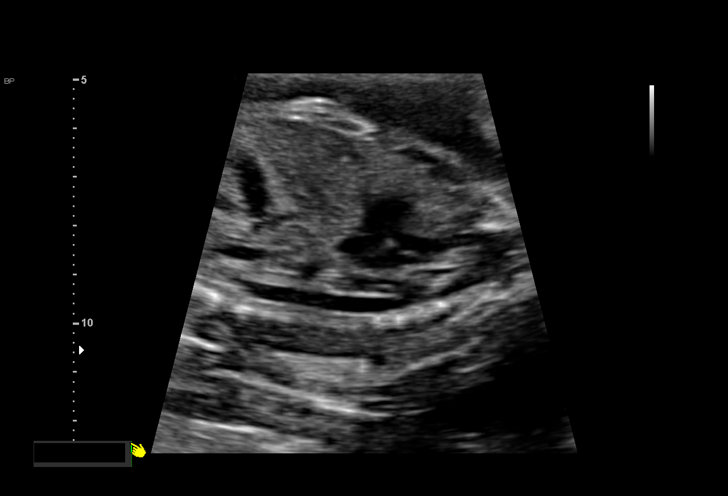
[im 44/54]
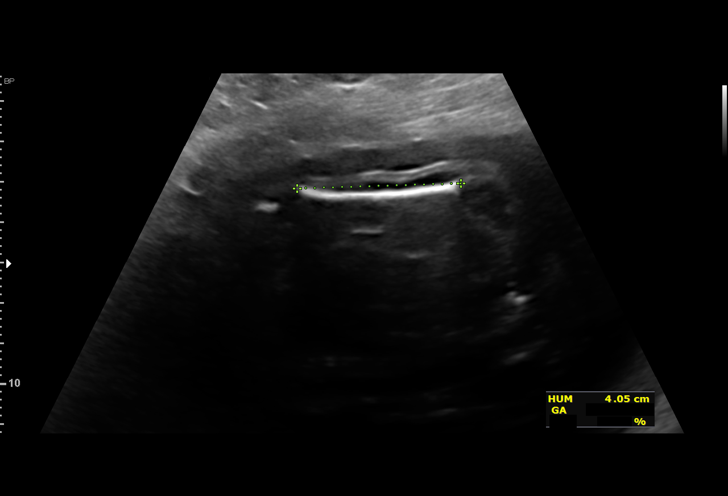
[im 48/54]
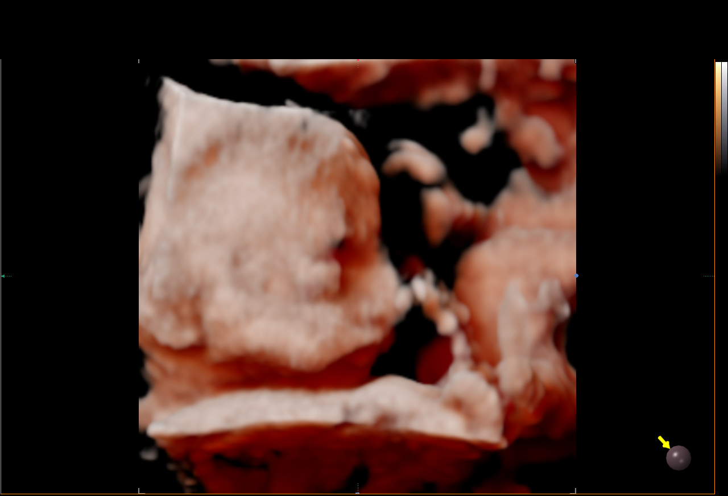
[im 52/54]
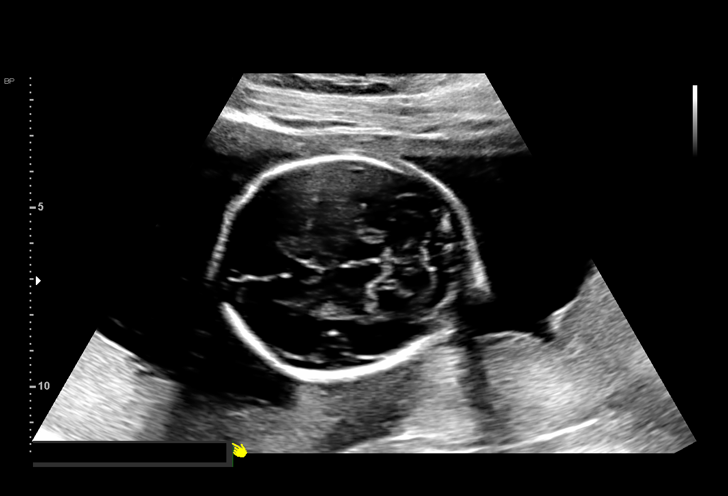

[13 of 28 positions shown; findings below may reference images not displayed]

Indications

 Hypertension - Chronic/Pre-existing
 (labetalol)
 Low fetal fraction X2
 Obesity complicating pregnancy
 Poor obstetric history: Previous
 preeclampsia / eclampsia/gestational HTN
 Poor obstetric history: Previous preterm
 delivery, antepartum (33w)
 Poor obstetric history: Previous gestational
 diabetes
 24 weeks gestation of pregnancy
Vital Signs

                                                Height:        5'5"
Fetal Evaluation

 Num Of Fetuses:         1
 Fetal Heart Rate(bpm):  157
 Cardiac Activity:       Observed
 Presentation:           Cephalic
 Placenta:               Posterior
 P. Cord Insertion:      Previously Visualized
 Amniotic Fluid
 AFI FV:      Within normal limits

                             Largest Pocket(cm)

Biometry

 BPD:      61.3  mm     G. Age:  24w 6d         76  %    CI:        74.63   %    70 - 86
                                                         FL/HC:      18.6   %    18.7 -
 HC:      225.2  mm     G. Age:  24w 4d         53  %    HC/AC:      1.13        1.05 -
 AC:      199.7  mm     G. Age:  24w 4d         61  %    FL/BPD:     68.2   %    71 - 87
 FL:       41.8  mm     G. Age:  23w 4d         25  %    FL/AC:      20.9   %    20 - 24
 HUM:      39.2  mm     G. Age:  24w 0d         41  %
 CER:      26.1  mm     G. Age:  23w 4d         52  %
 LV:        3.2  mm
 CM:        4.6  mm

 Est. FW:     676  gm      1 lb 8 oz     53  %
OB History

 Gravidity:    6         Prem:   1         SAB:   3
 Ectopic:      1
Gestational Age

 LMP:           24w 0d        Date:  04/13/21                 EDD:   01/18/22
 U/S Today:     24w 3d                                        EDD:   01/15/22
 Best:          24w 0d     Det. By:  LMP  (04/13/21)          EDD:   01/18/22
Anatomy

 Cranium:               Appears normal         LVOT:                   Appears normal
 Cavum:                 Appears normal         Aortic Arch:            Previously seen
 Ventricles:            Appears normal         Ductal Arch:            Not well visualized
 Choroid Plexus:        Previously seen        Diaphragm:              Appears normal
 Cerebellum:            Appears normal         Stomach:                Appears normal, left
                                                                       sided
 Posterior Fossa:       Appears normal         Abdomen:                Appears normal
 Nuchal Fold:           Not applicable (>20    Abdominal Wall:         Appears nml (cord
                        wks GA)                                        insert, abd wall)
 Face:                  Appears normal         Cord Vessels:           Appears normal (3
                        (orbits and profile)                           vessel cord)
 Lips:                  Appears normal         Kidneys:                Appear normal
 Palate:                Previously seen        Bladder:                Appears normal
 Thoracic:              Appears normal         Spine:                  Previously seen
 Heart:                 Appears normal         Upper Extremities:      Previously seen
                        (4CH, axis, and
                        situs)
 RVOT:                  Appears normal         Lower Extremities:      Previously seen

 Other:  Fetus appears to be a male. VC and 3VV visualized. 5th digit, nasal
         bone, feet  previously visualized. Technically difficult due to maternal
         habitus and fetal position.
Cervix Uterus Adnexa
 Cervix
 Not visualized (advanced GA >10wks)

 Uterus
 Normal shape and size.

 Right Ovary
 Not visualized.

 Left Ovary
 Not visualized.
Impression

 Follow up growth due to chronic hypertension and elevated
 BMI
 Normal interval growth with measurements consistent with
 dates
 Good fetal movement and amniotic fluid volume
 Suboptimal views of the fetal ductal arch was again seen.
 Blood pressure was normal today at 133/69 mmHg.
Recommendations

 Continue serial growth every 4 weeks.
 initiate weekly testing at 32 weeks.

## 2023-07-24 DIAGNOSIS — F411 Generalized anxiety disorder: Secondary | ICD-10-CM | POA: Diagnosis not present

## 2023-07-24 DIAGNOSIS — S6410XA Injury of median nerve at wrist and hand level of unspecified arm, initial encounter: Secondary | ICD-10-CM | POA: Insufficient documentation

## 2023-07-24 DIAGNOSIS — S6411XD Injury of median nerve at wrist and hand level of right arm, subsequent encounter: Secondary | ICD-10-CM | POA: Diagnosis not present

## 2023-08-14 DIAGNOSIS — F411 Generalized anxiety disorder: Secondary | ICD-10-CM | POA: Diagnosis not present

## 2023-09-18 DIAGNOSIS — F411 Generalized anxiety disorder: Secondary | ICD-10-CM | POA: Diagnosis not present

## 2023-10-13 DIAGNOSIS — F411 Generalized anxiety disorder: Secondary | ICD-10-CM | POA: Diagnosis not present

## 2023-10-28 ENCOUNTER — Encounter: Payer: Self-pay | Admitting: Emergency Medicine

## 2023-10-28 ENCOUNTER — Ambulatory Visit
Admission: EM | Admit: 2023-10-28 | Discharge: 2023-10-28 | Disposition: A | Discharge status: Home / Self Care | Attending: Physician Assistant | Admitting: Physician Assistant

## 2023-10-28 ENCOUNTER — Other Ambulatory Visit: Payer: Self-pay

## 2023-10-28 DIAGNOSIS — L723 Sebaceous cyst: Secondary | ICD-10-CM | POA: Diagnosis not present

## 2023-10-28 MED ORDER — DOXYCYCLINE HYCLATE 100 MG PO CAPS: 100.0000 mg | ORAL_CAPSULE | Freq: Two times a day (BID) | ORAL | 0 refills | Status: DC

## 2023-10-28 NOTE — ED Triage Notes (Signed)
 Pt here for possible abscess cyst to back x 4 days with increased pain to area

## 2023-10-28 NOTE — ED Provider Notes (Signed)
 [name]    CSN: [id number] Arrival date & time: 10/28/23  1545      History   Chief Complaint Chief Complaint  Patient presents with   Abscess    HPI Alexis Ellis is a 33 y.o. female.   Patient complains of an abscess on her back that has become very uncomfortable.  Patient reports that she has had a small knot in the area in the past however this area now has became red and swollen and very painful.  Patient denies any fever or chills.  Patient denies any nausea or vomiting   Abscess   Past Medical History:  Diagnosis Date   ADD (attention deficit disorder)    Chicken pox    Depression    Diet controlled gestational diabetes mellitus (GDM) in third trimester 03/23/2019   Ectopic pregnancy    Elevated blood pressure    Elevated cholesterol    Frequent headaches    Gestational diabetes    Gestational hypertension 04/19/2019   History of gestational diabetes in prior pregnancy, currently pregnant 07/06/2021   History of kidney stones    HLD (hyperlipidemia) 09/29/2015   Pregnancy induced hypertension    Status post emergency cesarean section 01/03/2022    Patient Active Problem List   Diagnosis Date Noted   Mixed hyperlipidemia 10/24/2022   Chronic joint pain 04/22/2022   Anxiety 12/27/2020   Class 2 obesity due to excess calories without serious comorbidity with body mass index (BMI) of 37.0 to 37.9 in adult 12/27/2020   HTN (hypertension) 03/27/2020   ADD (attention deficit disorder) 11/07/2013    Past Surgical History:  Procedure Laterality Date   CESAREAN SECTION N/A 01/03/2022   Procedure: CESAREAN SECTION;  Surgeon: Tresia Fruit, MD;  Location: [facility];  Service: Obstetrics;  Laterality: N/A;   LAPAROSCOPY N/A 09/16/2012   Procedure: LAPAROSCOPY OPERATIVE;  Surgeon: Jeanmarie Millet, MD;  Location: [facility];  Service: Gynecology;  Laterality: N/A;  Evacuation Hemoperitoneum   TUBAL LIGATION  2014   UNILATERAL SALPINGECTOMY Right  09/16/2012   Procedure: UNILATERAL SALPINGECTOMY;  Surgeon: Jeanmarie Millet, MD;  Location: [facility];  Service: Gynecology;  Laterality: Right;   WISDOM TOOTH EXTRACTION      OB History     Gravida  6   Para  2   Term  1   Preterm  1   AB  4   Living  2      SAB  3   IAB      Ectopic  1   Multiple  0   Live Births  2            Home Medications    Prior to Admission medications   Medication Sig Start Date End Date Taking? Authorizing Provider  doxycycline  (VIBRAMYCIN ) 100 MG capsule Take 1 capsule (100 mg total) by mouth 2 (two) times daily. 10/28/23  Yes Dorothey Gate K, PA-C  amphetamine -dextroamphetamine  (ADDERALL) 20 MG tablet Take 20 mg by mouth 3 (three) times daily. 04/19/22   [provider]  clonazePAM (KLONOPIN) 1 MG tablet Take 1 mg by mouth daily as needed for anxiety.    [provider]  fluticasone  (FLONASE ) 50 MCG/ACT nasal spray Place 2 sprays into both nostrils daily. 10/13/20   Carollynn Cirri, NP  lisinopril -hydrochlorothiazide  (ZESTORETIC ) 20-25 MG tablet Take 1 tablet by mouth daily. 05/05/23   Carollynn Cirri, NP  nystatin  (MYCOSTATIN /NYSTOP ) powder Apply 1 Application topically 3 (three)  times daily. 05/15/23   Carollynn Cirri, NP  simvastatin  (ZOCOR ) 20 MG tablet Take 1 tablet (20 mg total) by mouth at bedtime. 05/08/23   Carollynn Cirri, NP    Family History Family History  Problem Relation Age of Onset   Hypertension Mother        Living   Fibromyalgia Mother    Diabetes Father 66       Deceased   Heart attack Father    Sudden death Father    Bipolar disorder Maternal Grandmother    Diabetes Maternal Grandfather    Alcohol abuse Paternal Grandfather    Liver disease Paternal Grandfather    Hyperlipidemia Paternal Grandmother    Kidney disease Paternal Grandmother    Liver disease Paternal Grandmother    Heart disease Paternal Grandmother    Healthy Sister    Healthy Son     Social History Social History    Tobacco Use   Smoking status: Former    Current packs/day: 0.00    Types: Cigarettes    Quit date: 2020    Years since quitting: 5.4   Smokeless tobacco: Never  Vaping Use   Vaping status: Never Used  Substance Use Topics   Alcohol use: Not Currently    Comment: ocassional   Drug use: No     Allergies   Patient has no known allergies.   Review of Systems Review of Systems  All other systems reviewed and are negative.    Physical Exam Triage Vital Signs ED Triage Vitals  Encounter Vitals Group     BP 10/28/23 1633 139/87     Girls Systolic BP Percentile --      Girls Diastolic BP Percentile --      Boys Systolic BP Percentile --      Boys Diastolic BP Percentile --      Pulse Rate 10/28/23 1633 84     Resp 10/28/23 1633 18     Temp 10/28/23 1633 97.8 F (36.6 C)     Temp Source 10/28/23 1633 Oral     SpO2 10/28/23 1633 96 %     Weight --      Height --      Head Circumference --      Peak Flow --      Pain Score 10/28/23 1634 8     Pain Loc --      Pain Education --      Exclude from Growth Chart --    No data found.  Updated Vital Signs BP 139/87 (BP Location: Left Arm)   Pulse 84   Temp 97.8 F (36.6 C) (Oral)   Resp 18   SpO2 96%   Visual Acuity Right Eye Distance:   Left Eye Distance:   Bilateral Distance:    Right Eye Near:   Left Eye Near:    Bilateral Near:     Physical Exam Vitals reviewed.  Constitutional:      Appearance: Normal appearance.   Skin:    General: Skin is warm.     Comments: 3 cm area of redness and swelling right low back, appears to be an infection sebaceous cyst   Neurological:     General: No focal deficit present.     Mental Status: She is alert.      UC Treatments / Results  Labs (all labs ordered are listed, but only abnormal results are displayed) Labs Reviewed - No data to display  EKG   Radiology No  results found.  Procedures Incision and Drainage  Date/Time: 10/28/2023 7:02  PM  Performed by: Sandi Crosby, PA-C Authorized by: Sandi Crosby, PA-C   Consent:    Consent obtained:  Verbal   Consent given by:  Patient   Risks, benefits, and alternatives were discussed: yes   Universal protocol:    Procedure explained and questions answered to patient or proxy's satisfaction: yes     Immediately prior to procedure, a time out was called: yes     Patient identity confirmed:  Verbally with patient Location:    Type:  Cyst   Size:  3   Location:  Trunk   Trunk location:  Back Pre-procedure details:    Skin preparation:  Chlorhexidine with alcohol and povidone-iodine Anesthesia:    Anesthesia method:  Local infiltration Procedure type:    Complexity:  Complex Procedure details:    Incision types:  Single straight   Wound management:  Probed and deloculated   Drainage:  Purulent   Wound treatment:  Wound left open Comments:     Cystic material and cyst capsule removed  (including critical care time)  Medications Ordered in UC Medications - No data to display  Initial Impression / Assessment and Plan / UC Course  I have reviewed the triage vital signs and the nursing notes.  Pertinent labs & imaging results that were available during my care of the patient were reviewed by me and considered in my medical decision making (see chart for details).      Final Clinical Impressions(s) / UC Diagnoses   Final diagnoses:  Sebaceous cyst     Discharge Instructions      Return if any problems.    ED Prescriptions     Medication Sig Dispense Auth. Provider   doxycycline  (VIBRAMYCIN ) 100 MG capsule Take 1 capsule (100 mg total) by mouth 2 (two) times daily. 20 capsule Jazmaine Fuelling K, PA-C      PDMP not reviewed this encounter. An After Visit Summary was printed and given to the patient.       Sandi Crosby, PA-C 10/28/23 [id number]

## 2023-10-28 NOTE — Discharge Instructions (Addendum)
 Return if any problems.

## 2023-11-04 ENCOUNTER — Encounter: Payer: Self-pay | Admitting: Internal Medicine

## 2023-11-04 ENCOUNTER — Ambulatory Visit: Payer: Self-pay | Admitting: Internal Medicine

## 2023-11-04 VITALS — BP 138/84 | Ht 65.0 in | Wt 230.6 lb

## 2023-11-04 DIAGNOSIS — E66812 Obesity, class 2: Secondary | ICD-10-CM | POA: Diagnosis not present

## 2023-11-04 DIAGNOSIS — M2550 Pain in unspecified joint: Secondary | ICD-10-CM

## 2023-11-04 DIAGNOSIS — N6012 Diffuse cystic mastopathy of left breast: Secondary | ICD-10-CM | POA: Diagnosis not present

## 2023-11-04 DIAGNOSIS — F902 Attention-deficit hyperactivity disorder, combined type: Secondary | ICD-10-CM | POA: Diagnosis not present

## 2023-11-04 DIAGNOSIS — F419 Anxiety disorder, unspecified: Secondary | ICD-10-CM | POA: Diagnosis not present

## 2023-11-04 DIAGNOSIS — E782 Mixed hyperlipidemia: Secondary | ICD-10-CM | POA: Diagnosis not present

## 2023-11-04 DIAGNOSIS — R221 Localized swelling, mass and lump, neck: Secondary | ICD-10-CM | POA: Diagnosis not present

## 2023-11-04 DIAGNOSIS — R739 Hyperglycemia, unspecified: Secondary | ICD-10-CM | POA: Diagnosis not present

## 2023-11-04 DIAGNOSIS — I10 Essential (primary) hypertension: Secondary | ICD-10-CM

## 2023-11-04 DIAGNOSIS — Z6838 Body mass index (BMI) 38.0-38.9, adult: Secondary | ICD-10-CM | POA: Diagnosis not present

## 2023-11-04 DIAGNOSIS — G8929 Other chronic pain: Secondary | ICD-10-CM

## 2023-11-04 NOTE — Assessment & Plan Note (Signed)
 Encourage diet and exercise for weight loss

## 2023-11-04 NOTE — Assessment & Plan Note (Signed)
 Not medicated We will monitor

## 2023-11-04 NOTE — Assessment & Plan Note (Signed)
 She intermittently takes adderall 20 mg 3 times daily She will continue to follow with psychiatry

## 2023-11-04 NOTE — Assessment & Plan Note (Signed)
 She has not been taking lisinopril  HCTZ 20-25 mg daily, advised her to restart this Reinforced DASH diet and exercise for weight loss C-Met today

## 2023-11-04 NOTE — Patient Instructions (Signed)

## 2023-11-04 NOTE — Assessment & Plan Note (Signed)
 C-Met and lipid profile today Encouraged her to consume low-fat diet Not taking simvastatin  20 mg as prescribed, will consider having her restart this based on labs

## 2023-11-04 NOTE — Progress Notes (Signed)
 Subjective:    Patient ID: Alexis Ellis, female    DOB: 1990/06/09, 33 y.o.   MRN: [id number]  HPI  Patient presents to clinic today for 20-month follow-up of chronic conditions.  HTN: Her BP today is 138/84.  She is not taking lisinopril -hct in the last week.  ECG from 12/2020 reviewed.  Anxiety: Chronic, managed on clonazepam as needed.  She does not see a therapist but is seeing psychiatry.  She denies depression, SI/HI.  ADD: She reports inattention, difficulty concentrating.  She is not taking adderall daily as prescribed by psychiatry.  HLD: Her last LDL was 143, triglycerides 143, 04/2023.  She is not taking simvastatin  as prescribed. She has failed atorvastatin  in the past due to myalgia. She does not consume low-fat diet.  Chronic joint pain: Mainly in her hands and knees.  She is not taking any medication for this at this time. She has had a positive ANA in the past. She is not following with rheumatology.  She also reports a lump in the left side of her breast.  She reports she noticed this 1 month ago.  She is unsure if the lump has gotten bigger in size.  It is not tender to touch.  She has not noticed any changes in the skin of the breast or discharge from the nipple.  She also reports fullness to the left side of her neck.  She has noticed this over the last 2 weeks.  She reports the area is not tender or red.  She has not noticed any rashes in this area.  She has not tried anything OTC for this.  Review of Systems     Past Medical History:  Diagnosis Date   ADD (attention deficit disorder)    Chicken pox    Depression    Diet controlled gestational diabetes mellitus (GDM) in third trimester 03/23/2019   Ectopic pregnancy    Elevated blood pressure    Elevated cholesterol    Frequent headaches    Gestational diabetes    Gestational hypertension 04/19/2019   History of gestational diabetes in prior pregnancy, currently pregnant 07/06/2021   History of kidney  stones    HLD (hyperlipidemia) 09/29/2015   Pregnancy induced hypertension    Status post emergency cesarean section 01/03/2022    Current Outpatient Medications  Medication Sig Dispense Refill   amphetamine -dextroamphetamine  (ADDERALL) 20 MG tablet Take 20 mg by mouth 3 (three) times daily.     clonazePAM (KLONOPIN) 1 MG tablet Take 1 mg by mouth daily as needed for anxiety.     doxycycline  (VIBRAMYCIN ) 100 MG capsule Take 1 capsule (100 mg total) by mouth 2 (two) times daily. 20 capsule 0   fluticasone  (FLONASE ) 50 MCG/ACT nasal spray Place 2 sprays into both nostrils daily. 16 g 6   lisinopril -hydrochlorothiazide  (ZESTORETIC ) 20-25 MG tablet Take 1 tablet by mouth daily. 90 tablet 1   nystatin  (MYCOSTATIN /NYSTOP ) powder Apply 1 Application topically 3 (three) times daily. 30 g 0   simvastatin  (ZOCOR ) 20 MG tablet Take 1 tablet (20 mg total) by mouth at bedtime. 90 tablet 1   No current facility-administered medications for this visit.    No Known Allergies  Family History  Problem Relation Age of Onset   Hypertension Mother        Living   Fibromyalgia Mother    Diabetes Father 60       Deceased   Heart attack Father    Sudden death Father  Bipolar disorder Maternal Grandmother    Diabetes Maternal Grandfather    Alcohol abuse Paternal Grandfather    Liver disease Paternal Grandfather    Hyperlipidemia Paternal Grandmother    Kidney disease Paternal Grandmother    Liver disease Paternal Grandmother    Heart disease Paternal Grandmother    Healthy Sister    Healthy Son     Social History   Socioeconomic History   Marital status: Single    Spouse name: Not on file   Number of children: Not on file   Years of education: Not on file   Highest education level: Some college, no degree  Occupational History   Not on file  Tobacco Use   Smoking status: Former    Current packs/day: 0.00    Types: Cigarettes    Quit date: 2020    Years since quitting: 5.4    Smokeless tobacco: Never  Vaping Use   Vaping status: Never Used  Substance and Sexual Activity   Alcohol use: Not Currently    Comment: ocassional   Drug use: No   Sexual activity: Not Currently    Partners: Male  Other Topics Concern   Not on file  Social History Narrative   Not on file   Social Drivers of Health   Financial Resource Strain: High Risk (05/05/2023)   Overall Financial Resource Strain (CARDIA)    Difficulty of Paying Living Expenses: Very hard  Food Insecurity: Unknown (05/05/2023)   Hunger Vital Sign    Worried About Running Out of Food in the Last Year: Never true    Ran Out of Food in the Last Year: Patient declined  Transportation Needs: No Transportation Needs (05/05/2023)   PRAPARE - Administrator, Civil Service (Medical): No    Lack of Transportation (Non-Medical): No  Physical Activity: Sufficiently Active (05/05/2023)   Exercise Vital Sign    Days of Exercise per Week: 5 days    Minutes of Exercise per Session: 30 min  Stress: Stress Concern Present (05/05/2023)   [employer] of Occupational Health - Occupational Stress Questionnaire    Feeling of Stress : Rather much  Social Connections: Moderately Isolated (05/05/2023)   Social Connection and Isolation Panel    Frequency of Communication with Friends and Family: Twice a week    Frequency of Social Gatherings with Friends and Family: Once a week    Attends Religious Services: 1 to 4 times per year    Active Member of [employer] or Organizations: No    Attends Engineer, structural: Not on file    Marital Status: Never married  Intimate Partner Violence: Not on file     Constitutional: Denies fever, malaise, fatigue, headache or abrupt weight changes.  HEENT: Denies eye pain, eye redness, ear pain, ringing in the ears, wax buildup, runny nose, nasal congestion, bloody nose, or sore throat. Respiratory: Denies difficulty breathing, shortness of breath, cough or sputum  production.   Cardiovascular: Denies chest pain, chest tightness, palpitations or swelling in the hands and feet.  Gastrointestinal: Denies abdominal pain, bloating, constipation, diarrhea or blood in the stool.  GU: Denies urgency, frequency, pain with urination, burning sensation, blood in urine, odor or discharge. Musculoskeletal: Patient reports chronic joint pain.  Denies decrease in range of motion, difficulty with gait, muscle pain or joint swelling.  Skin: Patient reports lump in left breast, fullness to the left side of the neck denies redness, rashes, lesions or ulcercations.  Neurological: Patient reports inattention.  Denies  dizziness, difficulty with memory, difficulty with speech or problems with balance and coordination.  Psych: Patient has a history of anxiety.  Denies depression, SI/HI.  No other specific complaints in a complete review of systems (except as listed in HPI above).  Objective:   Physical Exam  BP 138/84 (BP Location: Left Arm, Patient Position: Sitting, Cuff Size: Normal)   Ht 5' 5 (1.651 m)   Wt 230 lb 9.6 oz (104.6 kg)   LMP 10/16/2023 (Approximate)   Breastfeeding No   BMI 38.37 kg/m    Wt Readings from Last 3 Encounters:  05/05/23 226 lb 12.8 oz (102.9 kg)  11/12/22 234 lb (106.1 kg)  10/24/22 237 lb (107.5 kg)    General: Appears her stated age, obese, in NAD. Skin: Warm, clammy and intact.  Breast: Symmetrical.  Fibrocystic changes noted at 11:00 and 1:00 in the left breast.  No discrete mass noted.   HEENT: Head: normal shape and size; Eyes: sclera white, no icterus, conjunctiva pink, PERRLA and EOMs intact;  Cardiovascular: Tachycardic with normal rhythm. S1,S2 noted.  No murmur, rubs or gallops noted. Trace pitting BLE edema.  Pulmonary/Chest: Normal effort and positive vesicular breath sounds. No respiratory distress. No wheezes, rales or ronchi noted.  Musculoskeletal: Cluster of nodules noted in the left supraclavicular area along with  fullness over the left carotid/sternocleidomastoid. No joint swelling noted.  No difficulty with gait.  Neurological: Alert and oriented. Coordination normal.  Psychiatric: Tearful.  Anxious appearing. Judgment and thought content normal.     BMET    Component Value Date/Time   NA 140 05/05/2023 1437   NA 139 02/26/2022 1133   K 4.7 05/05/2023 1437   CL 102 05/05/2023 1437   CO2 31 05/05/2023 1437   GLUCOSE 78 05/05/2023 1437   BUN 13 05/05/2023 1437   BUN 12 02/26/2022 1133   CREATININE 0.74 05/05/2023 1437   CALCIUM  9.8 05/05/2023 1437   GFRNONAA >60 01/02/2022 1850   GFRAA >60 05/01/2019 1723    Lipid Panel     Component Value Date/Time   CHOL 215 (H) 05/05/2023 1437   TRIG 143 05/05/2023 1437   HDL 45 (L) 05/05/2023 1437   CHOLHDL 4.8 05/05/2023 1437   VLDL 26.8 03/27/2020 1227   LDLCALC 143 (H) 05/05/2023 1437    CBC    Component Value Date/Time   WBC 8.9 05/05/2023 1437   RBC 5.35 (H) 05/05/2023 1437   HGB 15.4 05/05/2023 1437   HGB 12.1 02/26/2022 1133   HCT 46.0 (H) 05/05/2023 1437   HCT 38.1 02/26/2022 1133   PLT 350 05/05/2023 1437   PLT 307 02/26/2022 1133   MCV 86.0 05/05/2023 1437   MCV 77 (L) 02/26/2022 1133   MCH 28.8 05/05/2023 1437   MCHC 33.5 05/05/2023 1437   RDW 12.6 05/05/2023 1437   RDW 13.8 02/26/2022 1133   LYMPHSABS 2.4 07/06/2021 1058   MONOABS 0.5 12/26/2020 1426   EOSABS 0.1 07/06/2021 1058   BASOSABS 0.1 07/06/2021 1058    Hgb A1C Lab Results  Component Value Date   HGBA1C 5.4 05/05/2023           Assessment & Plan:   Mass of left side of neck:  Will obtain CT neck soft tissue for further evaluation  Fibrocystic changes in left breast:  No need for mammogram or ultrasound at this time, will monitor  RTC in 6 months for annual exam Angeline Laura, NP

## 2023-11-04 NOTE — Assessment & Plan Note (Signed)
 Continue clonazepam 1 mg daily as needed prescribed by psychiatry Support offered

## 2023-11-05 ENCOUNTER — Other Ambulatory Visit: Payer: Self-pay | Admitting: Internal Medicine

## 2023-11-05 ENCOUNTER — Ambulatory Visit: Payer: Self-pay | Admitting: Internal Medicine

## 2023-11-05 DIAGNOSIS — E782 Mixed hyperlipidemia: Secondary | ICD-10-CM

## 2023-11-05 DIAGNOSIS — E041 Nontoxic single thyroid nodule: Secondary | ICD-10-CM

## 2023-11-05 DIAGNOSIS — R9389 Abnormal findings on diagnostic imaging of other specified body structures: Secondary | ICD-10-CM

## 2023-11-05 LAB — COMPREHENSIVE METABOLIC PANEL WITH GFR
AG Ratio: 1.6 (calc) (ref 1.0–2.5)
ALT: 34 U/L — ABNORMAL HIGH (ref 6–29)
AST: 17 U/L (ref 10–30)
Albumin: 4.5 g/dL (ref 3.6–5.1)
Alkaline phosphatase (APISO): 95 U/L (ref 31–125)
BUN: 11 mg/dL (ref 7–25)
CO2: 26 mmol/L (ref 20–32)
Calcium: 9.9 mg/dL (ref 8.6–10.2)
Chloride: 102 mmol/L (ref 98–110)
Creat: 0.71 mg/dL (ref 0.50–0.97)
Globulin: 2.8 g/dL (ref 1.9–3.7)
Glucose, Bld: 104 mg/dL — ABNORMAL HIGH (ref 65–99)
Potassium: 4.7 mmol/L (ref 3.5–5.3)
Sodium: 137 mmol/L (ref 135–146)
Total Bilirubin: 0.7 mg/dL (ref 0.2–1.2)
Total Protein: 7.3 g/dL (ref 6.1–8.1)
eGFR: 114 mL/min/{1.73_m2} (ref >=60)

## 2023-11-05 LAB — LIPID PANEL
Cholesterol: 242 mg/dL — ABNORMAL HIGH (ref <200)
HDL: 44 mg/dL — ABNORMAL LOW (ref >=50)
LDL Cholesterol (Calc): 166 mg/dL — ABNORMAL HIGH
Non-HDL Cholesterol (Calc): 198 mg/dL — ABNORMAL HIGH (ref <130)
Total CHOL/HDL Ratio: 5.5 (calc) — ABNORMAL HIGH (ref <5.0)
Triglycerides: 167 mg/dL — ABNORMAL HIGH (ref <150)

## 2023-11-05 LAB — CBC
HCT: 52.2 % — ABNORMAL HIGH (ref 35.0–45.0)
Hemoglobin: 17 g/dL — ABNORMAL HIGH (ref 11.7–15.5)
MCH: 28.6 pg (ref 27.0–33.0)
MCHC: 32.6 g/dL (ref 32.0–36.0)
MCV: 87.9 fL (ref 80.0–100.0)
MPV: 9.5 fL (ref 7.5–12.5)
Platelets: 353 10*3/uL (ref 140–400)
RBC: 5.94 10*6/uL — ABNORMAL HIGH (ref 3.80–5.10)
RDW: 12.8 % (ref 11.0–15.0)
WBC: 10.9 10*3/uL — ABNORMAL HIGH (ref 3.8–10.8)

## 2023-11-05 LAB — HEMOGLOBIN A1C
Hgb A1c MFr Bld: 5.3 % (ref <5.7)
Mean Plasma Glucose: 105 mg/dL
eAG (mmol/L): 5.8 mmol/L

## 2023-11-05 MED ORDER — SIMVASTATIN 20 MG PO TABS: 20.0000 mg | ORAL_TABLET | Freq: Every day | ORAL | 1 refills | Status: DC

## 2023-11-11 ENCOUNTER — Ambulatory Visit
Admission: RE | Admit: 2023-11-11 | Discharge: 2023-11-11 | Disposition: A | Discharge status: Home / Self Care | Source: Ambulatory Visit | Attending: Internal Medicine | Admitting: Internal Medicine

## 2023-11-11 DIAGNOSIS — R221 Localized swelling, mass and lump, neck: Secondary | ICD-10-CM | POA: Diagnosis not present

## 2023-11-11 DIAGNOSIS — E041 Nontoxic single thyroid nodule: Secondary | ICD-10-CM | POA: Diagnosis not present

## 2023-11-11 MED ORDER — IOPAMIDOL (ISOVUE-370) INJECTION 76%
75.0000 mL | Freq: Once | INTRAVENOUS | Status: AC | PRN
  Administered 2023-11-11: 75 mL via INTRAVENOUS

## 2023-11-13 DIAGNOSIS — F411 Generalized anxiety disorder: Secondary | ICD-10-CM | POA: Diagnosis not present

## 2023-11-18 ENCOUNTER — Encounter: Payer: Self-pay | Admitting: Internal Medicine

## 2023-11-18 NOTE — Addendum Note (Signed)
 Addended by: Zared Knoth M on: 11/18/2023 08:59 AM   Modules accepted: Orders

## 2023-11-19 ENCOUNTER — Ambulatory Visit
Admission: RE | Admit: 2023-11-19 | Discharge: 2023-11-19 | Disposition: A | Discharge status: Home / Self Care | Source: Ambulatory Visit | Attending: Internal Medicine | Admitting: Internal Medicine

## 2023-11-19 DIAGNOSIS — E041 Nontoxic single thyroid nodule: Secondary | ICD-10-CM

## 2023-11-19 DIAGNOSIS — R9389 Abnormal findings on diagnostic imaging of other specified body structures: Secondary | ICD-10-CM

## 2023-11-19 DIAGNOSIS — E042 Nontoxic multinodular goiter: Secondary | ICD-10-CM | POA: Diagnosis not present

## 2023-11-21 ENCOUNTER — Ambulatory Visit: Payer: Self-pay | Admitting: Internal Medicine

## 2023-11-24 ENCOUNTER — Other Ambulatory Visit: Payer: Self-pay | Admitting: Internal Medicine

## 2023-11-24 DIAGNOSIS — E041 Nontoxic single thyroid nodule: Secondary | ICD-10-CM

## 2023-11-25 ENCOUNTER — Other Ambulatory Visit

## 2023-11-25 DIAGNOSIS — E041 Nontoxic single thyroid nodule: Secondary | ICD-10-CM

## 2023-12-09 ENCOUNTER — Ambulatory Visit (HOSPITAL_COMMUNITY): Admission: EM | Admit: 2023-12-09 | Discharge: 2023-12-09 | Disposition: A | Discharge status: Home / Self Care

## 2023-12-09 DIAGNOSIS — F4310 Post-traumatic stress disorder, unspecified: Secondary | ICD-10-CM | POA: Diagnosis not present
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
## 2023-12-09 NOTE — Progress Notes (Signed)
   12/09/23 0953  [place] Suicide Severity Rating Scale  1. In the past month -  Have you wished you were dead or wished you could go to sleep and not wake up? No  2. In the past month - Have you actually had any thoughts of killing yourself? No  6. Have you ever done anything, started to do anything, or prepared to do anything to end your life? No  C-SSRS RISK CATEGORY No Risk

## 2023-12-30 DIAGNOSIS — F4310 Post-traumatic stress disorder, unspecified: Secondary | ICD-10-CM | POA: Diagnosis not present

## 2024-01-26 DIAGNOSIS — F4310 Post-traumatic stress disorder, unspecified: Secondary | ICD-10-CM | POA: Diagnosis not present

## 2024-02-27 ENCOUNTER — Ambulatory Visit
Admission: EM | Admit: 2024-02-27 | Discharge: 2024-02-27 | Disposition: A | Discharge status: Home / Self Care | Attending: Nurse Practitioner | Admitting: Nurse Practitioner

## 2024-02-27 ENCOUNTER — Encounter: Payer: Self-pay | Admitting: Emergency Medicine

## 2024-02-27 DIAGNOSIS — J029 Acute pharyngitis, unspecified: Secondary | ICD-10-CM

## 2024-02-27 DIAGNOSIS — J069 Acute upper respiratory infection, unspecified: Secondary | ICD-10-CM | POA: Diagnosis not present

## 2024-02-27 LAB — POCT RAPID STREP A (OFFICE): Rapid Strep A Screen: NEGATIVE

## 2024-02-27 LAB — POC COVID19/FLU A&B COMBO
Covid Antigen, POC: NEGATIVE
Influenza A Antigen, POC: NEGATIVE
Influenza B Antigen, POC: NEGATIVE

## 2024-02-27 MED ORDER — LIDOCAINE VISCOUS HCL 2 % MT SOLN
15.0000 mL | Freq: Once | OROMUCOSAL | Status: AC
  Administered 2024-02-27: 15 mL via OROMUCOSAL

## 2024-02-27 MED ORDER — IBUPROFEN 800 MG PO TABS: 800.0000 mg | ORAL_TABLET | Freq: Three times a day (TID) | ORAL | 0 refills | Status: DC | PRN

## 2024-02-27 MED ORDER — LIDOCAINE VISCOUS HCL 2 % MT SOLN: OROMUCOSAL | 0 refills | Status: DC

## 2024-02-27 MED ORDER — PSEUDOEPH-BROMPHEN-DM 30-2-10 MG/5ML PO SYRP: 10.0000 mL | ORAL_SOLUTION | Freq: Four times a day (QID) | ORAL | 0 refills | Status: DC | PRN

## 2024-02-27 MED ORDER — CETIRIZINE-PSEUDOEPHEDRINE ER 5-120 MG PO TB12: 1.0000 | ORAL_TABLET | Freq: Every day | ORAL | 0 refills | Status: DC

## 2024-02-27 MED ORDER — CLARITIN-D 12 HOUR 5-120 MG PO TB12: 1.0000 | ORAL_TABLET | Freq: Every morning | ORAL | 0 refills | Status: AC

## 2024-02-27 NOTE — ED Triage Notes (Signed)
 Pt presents c/o URI x 2 days. Pt states,  It started yesterday evening. I was dragging last night and felt like I had a fever. I had a sore throat and I keep coughing up a bunch of gunk. It hurts to swallow. A little body aches and headache.  Pt denies emesis and diarrhea.

## 2024-02-27 NOTE — ED Provider Notes (Signed)
 [name]    CSN: [id number] Arrival date & time: 02/27/24  1629      History   Chief Complaint Chief Complaint  Patient presents with   Sore Throat   Cough   Headache    HPI Alexis Ellis is a 33 y.o. female.   Discussed the use of AI scribe software for clinical note transcription with the patient, who gave verbal consent to proceed.   The patient presents with acute onset of upper respiratory symptoms that began yesterday with a sudden and severe onset. The patient reports sore throat, cough, body aches, and headaches. The patient experienced subjective fever last night, feeling hot but did not take their temperature, and denies chills. The patient reports recent onset of nasal congestion that just started but denies runny nose or sneezing. The patient denies nausea, vomiting, or diarrhea. No medications have been taken for the current symptoms. The patient reports recent exposure to someone who was sick, noting that the exposed individual went to a doctor and tested negative for various conditions. The patient is a smoker.  The following sections of the patient's history were reviewed and updated as appropriate: allergies, current medications, past family history, past medical history, past social history, past surgical history, and problem list.       Past Medical History:  Diagnosis Date   ADD (attention deficit disorder)    Anxiety    Chicken pox    Depression    Diet controlled gestational diabetes mellitus (GDM) in third trimester 03/23/2019   Ectopic pregnancy    Elevated blood pressure    Elevated cholesterol    Frequent headaches    Gestational diabetes    Gestational hypertension 04/19/2019   History of gestational diabetes in prior pregnancy, currently pregnant 07/06/2021   History of kidney stones    HLD (hyperlipidemia) 09/29/2015   Pregnancy induced hypertension    Status post emergency cesarean section 01/03/2022    Patient  Active Problem List   Diagnosis Date Noted   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   Encounter for other orthopedic aftercare 07/07/2023   Carpal tunnel syndrome of left wrist 04/24/2023   Bilateral wrist pain 04/24/2023   Mixed hyperlipidemia 10/24/2022   Chronic joint pain 04/22/2022   Anxiety 12/27/2020   Class 2 obesity due to excess calories with body mass index (BMI) of 38.0 to 38.9 in adult 12/27/2020   HTN (hypertension) 03/27/2020   ADD (attention deficit disorder) 11/07/2013   Acquired trigger finger 06/28/2011    Past Surgical History:  Procedure Laterality Date   CESAREAN SECTION N/A 01/03/2022   Procedure: CESAREAN SECTION;  Surgeon: Eveline Lynwood MATSU, MD;  Location: [facility];  Service: Obstetrics;  Laterality: N/A;   LAPAROSCOPY N/A 09/16/2012   Procedure: LAPAROSCOPY OPERATIVE;  Surgeon: Lynwood FORBES Curlene PONCE, MD;  Location: [facility];  Service: Gynecology;  Laterality: N/A;  Evacuation Hemoperitoneum   TUBAL LIGATION  2014   UNILATERAL SALPINGECTOMY Right 09/16/2012   Procedure: UNILATERAL SALPINGECTOMY;  Surgeon: Lynwood FORBES Curlene PONCE, MD;  Location: [facility];  Service: Gynecology;  Laterality: Right;   WISDOM TOOTH EXTRACTION      OB History     Gravida  6   Para  2   Term  1   Preterm  1   AB  4   Living  2      SAB  3   IAB      Ectopic  1   Multiple  0   Live Births  2            Home Medications    Prior to Admission medications   Medication Sig Start Date End Date Taking? Authorizing Provider  ibuprofen  (ADVIL ) 800 MG tablet Take 1 tablet (800 mg total) by mouth every 8 (eight) hours as needed (pain). Take with food to avoid stomach upset. Do not take any additional NSAIDs while on this. You may take tylenol  in addition to this if needed for extra pain relief. 02/27/24  Yes Iola Lukes, FNP  lidocaine  (XYLOCAINE ) 2 % solution Gargle with 15 mL every hour as needed for sore throat. Spit out after gargling - do not swallow. Avoid eating or  drinking for at least 15 minutes after use. 02/27/24  Yes Jermika Olden, Lukes, FNP  loratadine-pseudoephedrine (CLARITIN-D 12 HOUR) 5-120 MG tablet Take 1 tablet by mouth every morning for 10 days. 02/27/24 03/08/24 Yes Iola Lukes, FNP  amphetamine -dextroamphetamine  (ADDERALL) 20 MG tablet Take 20 mg by mouth 3 (three) times daily. 04/19/22   [provider]  clonazePAM (KLONOPIN) 1 MG tablet Take 1 mg by mouth daily as needed for anxiety.    [provider]  doxycycline  (VIBRAMYCIN ) 100 MG capsule Take 1 capsule (100 mg total) by mouth 2 (two) times daily. 10/28/23   Sofia, Leslie K, PA-C  fluticasone  (FLONASE ) 50 MCG/ACT nasal spray Place 2 sprays into both nostrils daily. 10/13/20   Antonette Angeline ORN, NP  lisinopril -hydrochlorothiazide  (ZESTORETIC ) 20-25 MG tablet Take 1 tablet by mouth daily. Patient not taking: Reported on 11/04/2023 05/05/23   Antonette Angeline ORN, NP  simvastatin  (ZOCOR ) 20 MG tablet Take 1 tablet (20 mg total) by mouth at bedtime. 11/05/23   Antonette Angeline ORN, NP    Family History Family History  Problem Relation Age of Onset   Hypertension Mother        Living   Fibromyalgia Mother    Diabetes Father 44       Deceased   Heart attack Father    Sudden death Father    Bipolar disorder Maternal Grandmother    Diabetes Maternal Grandfather    Alcohol abuse Paternal Grandfather    Liver disease Paternal Grandfather    Hyperlipidemia Paternal Grandmother    Kidney disease Paternal Grandmother    Liver disease Paternal Grandmother    Heart disease Paternal Grandmother    Healthy Sister    Healthy Son     Social History Social History   Tobacco Use   Smoking status: Former    Current packs/day: 0.00    Types: Cigarettes    Quit date: 2020    Years since quitting: 5.7    Passive exposure: Current   Smokeless tobacco: Never  Vaping Use   Vaping status: Never Used  Substance Use Topics   Alcohol use: Not Currently    Comment: ocassional   Drug  use: No     Allergies   Patient has no known allergies.   Review of Systems Review of Systems  Constitutional:  Positive for fever (subjective). Negative for chills.  HENT:  Positive for congestion and sore throat. Negative for rhinorrhea and sneezing.   Respiratory:  Positive for cough (productive).   Gastrointestinal:  Negative for diarrhea, nausea and vomiting.  Musculoskeletal:  Positive for myalgias.  Neurological:  Negative for headaches.  All other systems reviewed and are negative.    Physical Exam Triage Vital Signs ED Triage Vitals  Encounter Vitals Group     BP  02/27/24 1743 125/88     Girls Systolic BP Percentile --      Girls Diastolic BP Percentile --      Boys Systolic BP Percentile --      Boys Diastolic BP Percentile --      Pulse Rate 02/27/24 1743 100     Resp 02/27/24 1743 18     Temp 02/27/24 1743 98.1 F (36.7 C)     Temp Source 02/27/24 1743 Oral     SpO2 02/27/24 1743 98 %     Weight 02/27/24 1741 230 lb 9.6 oz (104.6 kg)     Height --      Head Circumference --      Peak Flow --      Pain Score 02/27/24 1740 5     Pain Loc --      Pain Education --      Exclude from Growth Chart --    No data found.  Updated Vital Signs BP 125/88 (BP Location: Left Arm)   Pulse 100   Temp 98.1 F (36.7 C) (Oral)   Resp 18   Wt 230 lb 9.6 oz (104.6 kg)   LMP 02/16/2024 (Approximate)   SpO2 98%   BMI 38.37 kg/m   Visual Acuity Right Eye Distance:   Left Eye Distance:   Bilateral Distance:    Right Eye Near:   Left Eye Near:    Bilateral Near:     Physical Exam Vitals reviewed.  Constitutional:      General: She is awake. She is not in acute distress.    Appearance: Normal appearance. She is well-developed. She is not ill-appearing, toxic-appearing or diaphoretic.  HENT:     Head: Normocephalic.     Right Ear: Tympanic membrane, ear canal and external ear normal. No drainage, swelling or tenderness. No middle ear effusion. Tympanic  membrane is not erythematous.     Left Ear: Tympanic membrane, ear canal and external ear normal. No drainage, swelling or tenderness.  No middle ear effusion. Tympanic membrane is not erythematous.     Nose: Congestion present. No rhinorrhea.     Mouth/Throat:     Lips: Pink.     Mouth: Mucous membranes are moist.     Pharynx: No pharyngeal swelling, oropharyngeal exudate, posterior oropharyngeal erythema or uvula swelling.     Tonsils: No tonsillar exudate or tonsillar abscesses.  Eyes:     General: Vision grossly intact.     Conjunctiva/sclera: Conjunctivae normal.  Cardiovascular:     Rate and Rhythm: Normal rate.     Heart sounds: Normal heart sounds.  Pulmonary:     Effort: Pulmonary effort is normal. No tachypnea or respiratory distress.     Breath sounds: Normal breath sounds and air entry.  Musculoskeletal:        General: Normal range of motion.     Cervical back: Normal range of motion and neck supple.  Lymphadenopathy:     Cervical: No cervical adenopathy.  Skin:    General: Skin is warm and dry.  Neurological:     General: No focal deficit present.     Mental Status: She is alert and oriented to person, place, and time.  Psychiatric:        Behavior: Behavior is cooperative.      UC Treatments / Results  Labs (all labs ordered are listed, but only abnormal results are displayed) Labs Reviewed  POC COVID19/FLU A&B COMBO - Normal  POCT RAPID STREP A (OFFICE) -  Normal  CULTURE, GROUP A STREP [facility])    EKG   Radiology No results found.  Procedures Procedures (including critical care time)  Medications Ordered in UC Medications  lidocaine  (XYLOCAINE ) 2 % viscous mouth solution 15 mL (15 mLs Mouth/Throat Given 02/27/24 1853)    Initial Impression / Assessment and Plan / UC Course  I have reviewed the triage vital signs and the nursing notes.  Pertinent labs & imaging results that were available during my care of the patient were reviewed by me and  considered in my medical decision making (see chart for details).     The patient presents with symptoms consistent with a viral upper respiratory infection. Strep, COVID and FLU negative. Throat culture pending. Exam is reassuring and no evidence of bacterial infection or acute cardiopulmonary process is noted. Supportive care is recommended. Patient was advised to re-test for COVID and FLU in 2 days; home test OK. She should follow up with primary care if symptoms do not improve within one week or if new concerns arise. Instructions were given to seek emergency care if symptoms worsen, including shortness of breath, chest pain, persistent high fever, inability to tolerate fluids, or confusion.  Today's evaluation has revealed no signs of a dangerous process. Discussed diagnosis with patient and/or guardian. Patient and/or guardian aware of their diagnosis, possible red flag symptoms to watch out for and need for close follow up. Patient and/or guardian understands verbal and written discharge instructions. Patient and/or guardian comfortable with plan and disposition.  Patient and/or guardian has a clear mental status at this time, good insight into illness (after discussion and teaching) and has clear judgment to make decisions regarding their care  Documentation was completed with the aid of voice recognition software. Transcription may contain typographical errors.  Final Clinical Impressions(s) / UC Diagnoses   Final diagnoses:  Acute sore throat  Viral upper respiratory tract infection     Discharge Instructions      Your tests for flu, COVID and strep was negative. Your symptoms are most likely due to a respiratory infection affecting your nose, throat, or lungs. These infections are usually caused by a virus, which means antibiotics will not help since they only treat bacterial infections. Because your symptoms just started yesterday, it's possible that testing was done too early to  detect flu or COVID. If you are not feeling better in two days, please return for repeat testing or take a home test. Please take any medications prescribed to you as directed. Several over-the-counter medicines can make you more comfortable. Nasal sprays like Flonase  may relieve nasal congestion, while saline sprays or rinses can be used often to keep your nose clear. Sore throat discomfort may improve with lozenges, menthol  or benzocaine  sprays, or warm saltwater gargles. These medicines do not cure the virus but can help you feel better as your body recovers.It is important to drink plenty of fluids to stay hydrated and help thin mucus. Aim for urine that is pale yellow. Using a cool mist humidifier at home, inhaling steam several times a day, and avoiding cool or dry air may also ease congestion. Sleeping with your head elevated can reduce post-nasal drainage, and getting enough rest each night supports recovery. Remember to replace your toothbrush once you start feeling better. A cough may last for several weeks after a respiratory illness even when other symptoms resolve, as the airways remain irritated. As long as the cough gradually improves and no new concerning symptoms appear, this is part  of normal healing.  If your symptoms worsen or you develop new problems such as trouble breathing, chest pain, or high fever, go to the emergency room right away.     ED Prescriptions     Medication Sig Dispense Auth. Provider   cetirizine -pseudoephedrine (ZYRTEC -D) 5-120 MG tablet  (Status: Discontinued) Take 1 tablet by mouth daily with breakfast for 10 days. 10 tablet Iola Lukes, FNP   brompheniramine-pseudoephedrine-DM 30-2-10 MG/5ML syrup  (Status: Discontinued) Take 10 mLs by mouth every 6 (six) hours as needed (cough and congestion). 120 mL Iola Lukes, FNP   lidocaine  (XYLOCAINE ) 2 % solution Gargle with 15 mL every hour as needed for sore throat. Spit out after gargling - do not swallow.  Avoid eating or drinking for at least 15 minutes after use. 200 mL Iola Lukes, FNP   ibuprofen  (ADVIL ) 800 MG tablet Take 1 tablet (800 mg total) by mouth every 8 (eight) hours as needed (pain). Take with food to avoid stomach upset. Do not take any additional NSAIDs while on this. You may take tylenol  in addition to this if needed for extra pain relief. 21 tablet [place] Pelto, [place], FNP   loratadine-pseudoephedrine (CLARITIN-D 12 HOUR) 5-120 MG tablet Take 1 tablet by mouth every morning for 10 days. 10 tablet Iola Lukes, FNP      PDMP not reviewed this encounter.   Iola Lukes, [place] 02/27/24 1931

## 2024-02-27 NOTE — Discharge Instructions (Addendum)
 Your tests for flu, COVID and strep was negative. Your symptoms are most likely due to a respiratory infection affecting your nose, throat, or lungs. These infections are usually caused by a virus, which means antibiotics will not help since they only treat bacterial infections. Because your symptoms just started yesterday, it's possible that testing was done too early to detect flu or COVID. If you are not feeling better in two days, please return for repeat testing or take a home test. Please take any medications prescribed to you as directed. Several over-the-counter medicines can make you more comfortable. Nasal sprays like Flonase  may relieve nasal congestion, while saline sprays or rinses can be used often to keep your nose clear. Sore throat discomfort may improve with lozenges, menthol  or benzocaine  sprays, or warm saltwater gargles. These medicines do not cure the virus but can help you feel better as your body recovers.It is important to drink plenty of fluids to stay hydrated and help thin mucus. Aim for urine that is pale yellow. Using a cool mist humidifier at home, inhaling steam several times a day, and avoiding cool or dry air may also ease congestion. Sleeping with your head elevated can reduce post-nasal drainage, and getting enough rest each night supports recovery. Remember to replace your toothbrush once you start feeling better. A cough may last for several weeks after a respiratory illness even when other symptoms resolve, as the airways remain irritated. As long as the cough gradually improves and no new concerning symptoms appear, this is part of normal healing.  If your symptoms worsen or you develop new problems such as trouble breathing, chest pain, or high fever, go to the emergency room right away.

## 2024-02-29 ENCOUNTER — Ambulatory Visit
Admission: EM | Admit: 2024-02-29 | Discharge: 2024-02-29 | Disposition: A | Discharge status: Home / Self Care | Attending: Emergency Medicine | Admitting: Emergency Medicine

## 2024-02-29 ENCOUNTER — Encounter: Payer: Self-pay | Admitting: Emergency Medicine

## 2024-02-29 DIAGNOSIS — J019 Acute sinusitis, unspecified: Secondary | ICD-10-CM | POA: Diagnosis not present

## 2024-02-29 DIAGNOSIS — R051 Acute cough: Secondary | ICD-10-CM

## 2024-02-29 DIAGNOSIS — B9689 Other specified bacterial agents as the cause of diseases classified elsewhere: Secondary | ICD-10-CM

## 2024-02-29 MED ORDER — PROMETHAZINE-DM 6.25-15 MG/5ML PO SYRP: 5.0000 mL | ORAL_SOLUTION | Freq: Four times a day (QID) | ORAL | 0 refills | Status: DC | PRN

## 2024-02-29 MED ORDER — AMOXICILLIN-POT CLAVULANATE 875-125 MG PO TABS: 1.0000 | ORAL_TABLET | Freq: Two times a day (BID) | ORAL | 0 refills | Status: AC

## 2024-02-29 NOTE — ED Provider Notes (Signed)
 [name]    CSN: [id number] Arrival date & time: 02/29/24  0802      History   Chief Complaint Chief Complaint  Patient presents with   sinus pressure     HPI Alexis Ellis is a 33 y.o. female.  Here with now 4 day history of symptoms Seen 2 days ago and dx with viral URI. Using prescriptions without change. Over last 48 hours abrupt increase in sinus pressure. Having pain in the sinuses and teeth, head, ears. Reports mucous is green Cough also productive.  No recent fever  Past Medical History:  Diagnosis Date   ADD (attention deficit disorder)    Anxiety    Chicken pox    Depression    Diet controlled gestational diabetes mellitus (GDM) in third trimester 03/23/2019   Ectopic pregnancy    Elevated blood pressure    Elevated cholesterol    Frequent headaches    Gestational diabetes    Gestational hypertension 04/19/2019   History of gestational diabetes in prior pregnancy, currently pregnant 07/06/2021   History of kidney stones    HLD (hyperlipidemia) 09/29/2015   Pregnancy induced hypertension    Status post emergency cesarean section 01/03/2022    Patient Active Problem List   Diagnosis Date Noted   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   Encounter for other orthopedic aftercare 07/07/2023   Carpal tunnel syndrome of left wrist 04/24/2023   Bilateral wrist pain 04/24/2023   Mixed hyperlipidemia 10/24/2022   Chronic joint pain 04/22/2022   Anxiety 12/27/2020   Class 2 obesity due to excess calories with body mass index (BMI) of 38.0 to 38.9 in adult 12/27/2020   HTN (hypertension) 03/27/2020   ADD (attention deficit disorder) 11/07/2013   Acquired trigger finger 06/28/2011    Past Surgical History:  Procedure Laterality Date   CESAREAN SECTION N/A 01/03/2022   Procedure: CESAREAN SECTION;  Surgeon: Eveline Lynwood MATSU, MD;  Location: [facility];  Service: Obstetrics;  Laterality: N/A;   LAPAROSCOPY N/A 09/16/2012   Procedure:  LAPAROSCOPY OPERATIVE;  Surgeon: Lynwood FORBES Curlene PONCE, MD;  Location: [facility];  Service: Gynecology;  Laterality: N/A;  Evacuation Hemoperitoneum   TUBAL LIGATION  2014   UNILATERAL SALPINGECTOMY Right 09/16/2012   Procedure: UNILATERAL SALPINGECTOMY;  Surgeon: Lynwood FORBES Curlene PONCE, MD;  Location: [facility];  Service: Gynecology;  Laterality: Right;   WISDOM TOOTH EXTRACTION      OB History     Gravida  6   Para  2   Term  1   Preterm  1   AB  4   Living  2      SAB  3   IAB      Ectopic  1   Multiple  0   Live Births  2            Home Medications    Prior to Admission medications   Medication Sig Start Date End Date Taking? Authorizing Provider  amoxicillin -clavulanate (AUGMENTIN ) 875-125 MG tablet Take 1 tablet by mouth every 12 (twelve) hours for 7 days. 02/29/24 03/07/24 Yes Kmari Brian, Asberry, PA-C  promethazine -dextromethorphan (PROMETHAZINE -DM) 6.25-15 MG/5ML syrup Take 5 mLs by mouth 4 (four) times daily as needed for cough. 02/29/24  Yes Shahram Alexopoulos, Asberry, PA-C  amphetamine -dextroamphetamine  (ADDERALL) 20 MG tablet Take 20 mg by mouth 3 (three) times daily. 04/19/22   [provider]  clonazePAM (KLONOPIN) 1 MG tablet Take 1 mg by mouth daily as needed for anxiety.  [provider]  fluticasone  (FLONASE ) 50 MCG/ACT nasal spray Place 2 sprays into both nostrils daily. 10/13/20   Antonette Angeline ORN, NP  ibuprofen  (ADVIL ) 800 MG tablet Take 1 tablet (800 mg total) by mouth every 8 (eight) hours as needed (pain). Take with food to avoid stomach upset. Do not take any additional NSAIDs while on this. You may take tylenol  in addition to this if needed for extra pain relief. 02/27/24   Murrill, Samantha, FNP  lidocaine  (XYLOCAINE ) 2 % solution Gargle with 15 mL every hour as needed for sore throat. Spit out after gargling - do not swallow. Avoid eating or drinking for at least 15 minutes after use. 02/27/24   Murrill, Samantha, FNP  loratadine-pseudoephedrine  (CLARITIN-D 12 HOUR) 5-120 MG tablet Take 1 tablet by mouth every morning for 10 days. 02/27/24 03/08/24  Iola Lukes, FNP  simvastatin  (ZOCOR ) 20 MG tablet Take 1 tablet (20 mg total) by mouth at bedtime. 11/05/23   Antonette Angeline ORN, NP    Family History Family History  Problem Relation Age of Onset   Hypertension Mother        Living   Fibromyalgia Mother    Diabetes Father 75       Deceased   Heart attack Father    Sudden death Father    Bipolar disorder Maternal Grandmother    Diabetes Maternal Grandfather    Alcohol abuse Paternal Grandfather    Liver disease Paternal Grandfather    Hyperlipidemia Paternal Grandmother    Kidney disease Paternal Grandmother    Liver disease Paternal Grandmother    Heart disease Paternal Grandmother    Healthy Sister    Healthy Son     Social History Social History   Tobacco Use   Smoking status: Former    Current packs/day: 0.00    Types: Cigarettes    Quit date: 2020    Years since quitting: 5.8    Passive exposure: Current   Smokeless tobacco: Never  Vaping Use   Vaping status: Never Used  Substance Use Topics   Alcohol use: Not Currently    Comment: ocassional   Drug use: No     Allergies   Patient has no known allergies.   Review of Systems Review of Systems  As per HPI  Physical Exam Triage Vital Signs ED Triage Vitals  Encounter Vitals Group     BP 02/29/24 0812 124/85     Girls Systolic BP Percentile --      Girls Diastolic BP Percentile --      Boys Systolic BP Percentile --      Boys Diastolic BP Percentile --      Pulse Rate 02/29/24 0812 85     Resp 02/29/24 0812 20     Temp 02/29/24 0812 (!) 97.5 F (36.4 C)     Temp Source 02/29/24 0812 Oral     SpO2 02/29/24 0812 98 %     Weight 02/29/24 0820 230 lb 9.6 oz (104.6 kg)     Height --      Head Circumference --      Peak Flow --      Pain Score 02/29/24 0819 10     Pain Loc --      Pain Education --      Exclude from Growth Chart --     No data found.  Updated Vital Signs BP 124/85 (BP Location: Left Arm)   Pulse 87   Temp (!) 97.5 F (36.4 C) (  Oral)   Resp 18   Wt 230 lb 9.6 oz (104.6 kg)   LMP 02/16/2024 (Approximate)   SpO2 97%   BMI 38.37 kg/m   Physical Exam Vitals and nursing note reviewed.  Constitutional:      Appearance: She is not ill-appearing.  HENT:     Right Ear: Tympanic membrane and ear canal normal.     Left Ear: Tympanic membrane and ear canal normal.     Nose: Congestion present. No rhinorrhea.     Right Sinus: Maxillary sinus tenderness and frontal sinus tenderness present.     Left Sinus: Maxillary sinus tenderness and frontal sinus tenderness present.     Mouth/Throat:     Mouth: Mucous membranes are moist.     Pharynx: Oropharynx is clear. No posterior oropharyngeal erythema.  Eyes:     Conjunctiva/sclera: Conjunctivae normal.  Cardiovascular:     Rate and Rhythm: Normal rate and regular rhythm.     Pulses: Normal pulses.     Heart sounds: Normal heart sounds.  Pulmonary:     Effort: Pulmonary effort is normal.     Breath sounds: Normal breath sounds.  Musculoskeletal:     Cervical back: Normal range of motion.  Lymphadenopathy:     Cervical: No cervical adenopathy.  Skin:    General: Skin is warm and dry.  Neurological:     Mental Status: She is alert and oriented to person, place, and time.      UC Treatments / Results  Labs (all labs ordered are listed, but only abnormal results are displayed) Labs Reviewed - No data to display  EKG   Radiology No results found.  Procedures Procedures (including critical care time)  Medications Ordered in UC Medications - No data to display  Initial Impression / Assessment and Plan / UC Course  I have reviewed the triage vital signs and the nursing notes.  Pertinent labs & imaging results that were available during my care of the patient were reviewed by me and considered in my medical decision making (see chart for  details).  Afebrile, clear lungs  With reported severe increase in symptoms over last 48 hours, cover for bacterial sinusitis with augmentin  BID x 7 days. Sent promethazine  DM for cough. Continue other supportive care. Monitor symptoms and return if needed Patient agrees to plan, no questions at this time   Final Clinical Impressions(s) / UC Diagnoses   Final diagnoses:  Acute bacterial sinusitis  Acute cough     Discharge Instructions      Augmentin  - antibiotic for bacterial sinusitis  Please take medication as prescribed. Take with food to avoid upset stomach. Finish the full course - you should not have any leftover!  The promethazine  DM cough syrup can be used up to 4 times daily. If this medication makes you drowsy, take only once before bed.  Continue other supportive care Drink lots of fluids Allow 4-5 more days for improvement      ED Prescriptions     Medication Sig Dispense Auth. Provider   promethazine -dextromethorphan (PROMETHAZINE -DM) 6.25-15 MG/5ML syrup Take 5 mLs by mouth 4 (four) times daily as needed for cough. 240 mL Bonniejean Piano, PA-C   amoxicillin -clavulanate (AUGMENTIN ) 875-125 MG tablet Take 1 tablet by mouth every 12 (twelve) hours for 7 days. 14 tablet Ronette Hank, Asberry, PA-C      PDMP not reviewed this encounter.   Jeryl Asberry, PA-C 02/29/24 [phone]

## 2024-02-29 NOTE — ED Triage Notes (Signed)
 Pt presents c/o sinus pressure x 2 days. Pt states,  I'm blowing out green, my teeth hurt, my face hurts, there's just so much pressure throughout my head.  Pt denies any additional sxs.

## 2024-02-29 NOTE — Discharge Instructions (Signed)
 Augmentin  - antibiotic for bacterial sinusitis  Please take medication as prescribed. Take with food to avoid upset stomach. Finish the full course - you should not have any leftover!  The promethazine  DM cough syrup can be used up to 4 times daily. If this medication makes you drowsy, take only once before bed.  Continue other supportive care Drink lots of fluids Allow 4-5 more days for improvement

## 2024-03-01 LAB — CULTURE, GROUP A STREP (THRC)

## 2024-03-28 ENCOUNTER — Other Ambulatory Visit: Payer: Self-pay

## 2024-03-28 ENCOUNTER — Encounter (HOSPITAL_COMMUNITY): Payer: Self-pay

## 2024-03-28 ENCOUNTER — Emergency Department (HOSPITAL_COMMUNITY)
Admission: EM | Admit: 2024-03-28 | Discharge: 2024-03-28 | Disposition: A | Discharge status: Home / Self Care | Attending: Emergency Medicine | Admitting: Emergency Medicine

## 2024-03-28 ENCOUNTER — Emergency Department (HOSPITAL_COMMUNITY)

## 2024-03-28 DIAGNOSIS — I10 Essential (primary) hypertension: Secondary | ICD-10-CM | POA: Insufficient documentation

## 2024-03-28 DIAGNOSIS — R0602 Shortness of breath: Secondary | ICD-10-CM | POA: Diagnosis not present

## 2024-03-28 DIAGNOSIS — Z79899 Other long term (current) drug therapy: Secondary | ICD-10-CM | POA: Insufficient documentation

## 2024-03-28 DIAGNOSIS — R0789 Other chest pain: Secondary | ICD-10-CM | POA: Diagnosis not present

## 2024-03-28 DIAGNOSIS — R058 Other specified cough: Secondary | ICD-10-CM | POA: Insufficient documentation

## 2024-03-28 DIAGNOSIS — R079 Chest pain, unspecified: Secondary | ICD-10-CM | POA: Diagnosis not present

## 2024-03-28 DIAGNOSIS — R918 Other nonspecific abnormal finding of lung field: Secondary | ICD-10-CM | POA: Diagnosis not present

## 2024-03-28 LAB — BASIC METABOLIC PANEL WITH GFR
Anion gap: 10 (ref 5–15)
BUN: 8 mg/dL (ref 6–20)
CO2: 23 mmol/L (ref 22–32)
Calcium: 8.9 mg/dL (ref 8.9–10.3)
Chloride: 107 mmol/L (ref 98–111)
Creatinine, Ser: 0.82 mg/dL (ref 0.44–1.00)
GFR, Estimated: >60 mL/min (ref >60)
Glucose, Bld: 95 mg/dL (ref 70–99)
Potassium: 3.7 mmol/L (ref 3.5–5.1)
Sodium: 140 mmol/L (ref 135–145)

## 2024-03-28 LAB — CBC
HCT: 43.1 % (ref 36.0–46.0)
Hemoglobin: 14.6 g/dL (ref 12.0–15.0)
MCH: 29.5 pg (ref 26.0–34.0)
MCHC: 33.9 g/dL (ref 30.0–36.0)
MCV: 87.1 fL (ref 80.0–100.0)
Platelets: 329 K/uL (ref 150–400)
RBC: 4.95 MIL/uL (ref 3.87–5.11)
RDW: 12.8 % (ref 11.5–15.5)
WBC: 12.8 K/uL — ABNORMAL HIGH (ref 4.0–10.5)
nRBC: 0 % (ref 0.0–0.2)

## 2024-03-28 LAB — RESP PANEL BY RT-PCR (RSV, FLU A&B, COVID)  RVPGX2
Influenza A by PCR: NEGATIVE
Influenza B by PCR: NEGATIVE
Resp Syncytial Virus by PCR: NEGATIVE
SARS Coronavirus 2 by RT PCR: NEGATIVE

## 2024-03-28 LAB — TROPONIN I (HIGH SENSITIVITY): Troponin I (High Sensitivity): 3 ng/L (ref <18)

## 2024-03-28 LAB — HCG, SERUM, QUALITATIVE: Preg, Serum: NEGATIVE

## 2024-03-28 MED ORDER — METHYLPREDNISOLONE SODIUM SUCC 125 MG IJ SOLR
125.0000 mg | Freq: Once | INTRAMUSCULAR | Status: AC
  Administered 2024-03-28: 125 mg via INTRAVENOUS
  Filled 2024-03-28: qty 2

## 2024-03-28 MED ORDER — IPRATROPIUM-ALBUTEROL 0.5-2.5 (3) MG/3ML IN SOLN
3.0000 mL | Freq: Once | RESPIRATORY_TRACT | Status: AC
  Administered 2024-03-28: 3 mL via RESPIRATORY_TRACT
  Filled 2024-03-28: qty 3

## 2024-03-28 MED ORDER — ALBUTEROL SULFATE HFA 108 (90 BASE) MCG/ACT IN AERS: 2.0000 | INHALATION_SPRAY | Freq: Once | RESPIRATORY_TRACT | Status: AC

## 2024-03-28 MED ORDER — IOHEXOL 350 MG/ML SOLN
75.0000 mL | Freq: Once | INTRAVENOUS | Status: AC | PRN
  Administered 2024-03-28: 75 mL via INTRAVENOUS

## 2024-03-28 MED ORDER — PREDNISONE 10 MG (21) PO TBPK: ORAL_TABLET | Freq: Every day | ORAL | 0 refills | Status: DC

## 2024-03-28 MED ORDER — ALBUTEROL SULFATE HFA 108 (90 BASE) MCG/ACT IN AERS
INHALATION_SPRAY | RESPIRATORY_TRACT | Status: AC
  Filled 2024-03-28: qty 6.7

## 2024-03-28 NOTE — Discharge Instructions (Signed)
 You were evaluated in the emergency room for shortness of breath and chest pain.  Your imaging and lab work was without significant abnormality.  You likely have bronchitis.  A prescription for prednisone  was sent into your pharmacy.  Please be sure to complete the full course of the steroids.  Please follow with your primary care doctor for further evaluation.

## 2024-03-28 NOTE — ED Triage Notes (Signed)
 PT arrived POV from home c/o CP and HTN. Pt states she was recently diagnosed with Pneumonia. Pt states the CP started last night. Pt states the pain radiates to both shoulders and her jaw line.Pt also c/o SHOB and nausea.

## 2024-03-28 NOTE — ED Notes (Signed)
 Walking pulse ox performed, 98-100% whole time

## 2024-03-28 NOTE — ED Notes (Signed)
 Patient transported to CT

## 2024-03-28 NOTE — ED Provider Notes (Signed)
 [facility] AT [facility] Provider Note   CSN: [id number] Arrival date & time: 03/28/24  1102     Patient presents with: Chest Pain   Alexis Ellis is a 33 y.o. 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  Patient states that her symptoms initially started out as well for like a sinus infection.  She was treated with a course of antibiotics.  Did not feel any notable improvement and she developed a productive cough.  Last night she started having chest pain.  Pain radiates to bilateral shoulders.  It is worse with coughing.  No fevers or chills.  No cardiac history.    Chest Pain     Past Medical History:  Diagnosis Date   ADD (attention deficit disorder)    Anxiety    Chicken pox    Depression    Diet controlled gestational diabetes mellitus (GDM) in third trimester 03/23/2019   Ectopic pregnancy    Elevated blood pressure    Elevated cholesterol    Frequent headaches    Gestational diabetes    Gestational hypertension 04/19/2019   History of gestational diabetes in prior pregnancy, currently pregnant 07/06/2021   History of kidney stones    HLD (hyperlipidemia) 09/29/2015   Pregnancy induced hypertension    Status post emergency cesarean section 01/03/2022   Past Surgical History:  Procedure Laterality Date   CESAREAN SECTION N/A 01/03/2022   Procedure: CESAREAN SECTION;  Surgeon: Eveline Lynwood MATSU, MD;  Location: [facility];  Service: Obstetrics;  Laterality: N/A;   LAPAROSCOPY N/A 09/16/2012   Procedure: LAPAROSCOPY OPERATIVE;  Surgeon: Lynwood FORBES Curlene PONCE, MD;  Location: [facility];  Service: Gynecology;  Laterality: N/A;  Evacuation Hemoperitoneum   TUBAL LIGATION  2014   UNILATERAL SALPINGECTOMY Right 09/16/2012   Procedure: UNILATERAL SALPINGECTOMY;  Surgeon: Lynwood FORBES Curlene PONCE, MD;   Location: [facility];  Service: Gynecology;  Laterality: Right;   WISDOM TOOTH EXTRACTION       Prior to Admission medications   Medication Sig Start Date End Date Taking? Authorizing Provider  predniSONE  (STERAPRED UNI-PAK 21 TAB) 10 MG (21) TBPK tablet Take by mouth daily. Take 6 tabs by mouth daily  for 2 days, then 5 tabs for 2 days, then 4 tabs for 2 days, then 3 tabs for 2 days, 2 tabs for 2 days, then 1 tab by mouth daily for 2 days 03/28/24  Yes Donnajean Lynwood DEL, PA-C  amphetamine -dextroamphetamine  (ADDERALL) 20 MG tablet Take 20 mg by mouth 3 (three) times daily. 04/19/22   [provider]  clonazePAM (KLONOPIN) 1 MG tablet Take 1 mg by mouth daily as needed for anxiety.    [provider]  fluticasone  (FLONASE ) 50 MCG/ACT nasal spray Place 2 sprays into both nostrils daily. 10/13/20   Antonette Angeline ORN, NP  ibuprofen  (ADVIL ) 800 MG tablet Take 1 tablet (800 mg total) by mouth every 8 (eight) hours as needed (pain). Take with food to avoid stomach upset. Do not take any additional NSAIDs while on this. You may take tylenol  in addition to this if needed for extra pain relief. 02/27/24   Iola Lukes, FNP  lidocaine  (XYLOCAINE ) 2 % solution Gargle with 15 mL every hour as needed for sore throat. Spit out after gargling - do not swallow. Avoid eating or drinking for at least 15 minutes  after use. 02/27/24   Murrill, Samantha, FNP  promethazine -dextromethorphan (PROMETHAZINE -DM) 6.25-15 MG/5ML syrup Take 5 mLs by mouth 4 (four) times daily as needed for cough. 02/29/24   Rising, Asberry, PA-C  simvastatin  (ZOCOR ) 20 MG tablet Take 1 tablet (20 mg total) by mouth at bedtime. 11/05/23   Antonette Angeline ORN, NP    Allergies: Patient has no known allergies.    Review of Systems  Cardiovascular:  Positive for chest pain.    Updated Vital Signs BP (!) 149/102 (BP Location: Left Arm)   Pulse 97   Temp 97.9 F (36.6 C)   Resp (!) 22   Ht 5' 5 (1.651 m)   Wt 100.7 kg   LMP  03/10/2024 (Approximate)   SpO2 93%   BMI 36.94 kg/m   Physical Exam Vitals and nursing note reviewed.  Constitutional:      General: She is not in acute distress.    Appearance: She is well-developed.  HENT:     Head: Normocephalic and atraumatic.  Eyes:     Conjunctiva/sclera: Conjunctivae normal.  Cardiovascular:     Rate and Rhythm: Normal rate and regular rhythm.     Heart sounds: No murmur heard. Pulmonary:     Effort: Pulmonary effort is normal. No respiratory distress.     Comments: Coarse breath sounds with diffuse faint wheezes Abdominal:     Palpations: Abdomen is soft.     Tenderness: There is no abdominal tenderness.  Musculoskeletal:        General: No swelling.     Cervical back: Neck supple.  Skin:    General: Skin is warm and dry.     Capillary Refill: Capillary refill takes less than 2 seconds.  Neurological:     Mental Status: She is alert.  Psychiatric:        Mood and Affect: Mood normal.     (all labs ordered are listed, but only abnormal results are displayed) Labs Reviewed  CBC - Abnormal; Notable for the following components:      Result Value   WBC 12.8 (*)    All other components within normal limits  RESP PANEL BY RT-PCR (RSV, FLU A&B, COVID)  [id number]  BASIC METABOLIC PANEL WITH GFR  HCG, SERUM, QUALITATIVE  D-DIMER, QUANTITATIVE  TROPONIN I (HIGH SENSITIVITY)  TROPONIN I (HIGH SENSITIVITY)    EKG: EKG Interpretation Date/Time:  Sunday March 28 2024 11:15:42 EST Ventricular Rate:  96 PR Interval:  122 QRS Duration:  66 QT Interval:  316 QTC Calculation: 399 R Axis:   65  Text Interpretation: Normal sinus rhythm Normal ECG When compared with ECG of 26-Dec-2020 14:16, PREVIOUS ECG IS PRESENT No significant change since last tracing Confirmed by Dean Clarity [phone]) on 03/28/2024 1:23:46 PM  Radiology: CT Angio Chest PE W and/or Wo Contrast Result Date: 03/28/2024 CLINICAL DATA:  Chest pain and hypertension. EXAM: CT  ANGIOGRAPHY CHEST WITH CONTRAST TECHNIQUE: Multidetector CT imaging of the chest was performed using the standard protocol during bolus administration of intravenous contrast. Multiplanar CT image reconstructions and MIPs were obtained to evaluate the vascular anatomy. RADIATION DOSE REDUCTION: This exam was performed according to the departmental dose-optimization program which includes automated exposure control, adjustment of the mA and/or kV according to patient size and/or use of iterative reconstruction technique. CONTRAST:  75mL OMNIPAQUE IOHEXOL 350 MG/ML SOLN COMPARISON:  None Available. FINDINGS: Cardiovascular: Satisfactory opacification of the pulmonary arteries to the segmental level. No evidence of pulmonary embolism. Normal heart size. No pericardial effusion.  Mediastinum/Nodes: No enlarged mediastinal, hilar, or axillary lymph nodes. Thyroid  gland, trachea, and esophagus demonstrate no significant findings. Lungs/Pleura: A 4 mm left lower lobe pulmonary nodule is noted (axial CT image 51, CT series 10). No acute infiltrate, pleural effusion or pneumothorax is identified. Upper Abdomen: No acute abnormality. Musculoskeletal: No chest wall abnormality. No acute or significant osseous findings. Review of the MIP images confirms the above findings. IMPRESSION: 1. No evidence of pulmonary embolism or other acute intrathoracic process. 2. 4 mm left lower lobe pulmonary nodule. No follow-up needed if patient is low-risk.This recommendation follows the consensus statement: Guidelines for Management of Incidental Pulmonary Nodules Detected on CT Images: From the Fleischner Society 2017; Radiology 2017; 284:228-243. Electronically Signed   By: Suzen Dials M.D.   On: 03/28/2024 14:48   DG Chest 2 View Result Date: 03/28/2024 CLINICAL DATA:  Chest pain and shortness of breath. EXAM: CHEST - 2 VIEW COMPARISON:  05/10/2021 FINDINGS: Lungs are hypoinflated and otherwise clear. Cardiomediastinal silhouette  and remainder of the exam is unchanged. IMPRESSION: Hypoinflation without acute cardiopulmonary disease. Electronically Signed   By: Toribio Agreste M.D.   On: 03/28/2024 11:59     Procedures   Medications Ordered in the ED  albuterol (VENTOLIN HFA) 108 (90 Base) MCG/ACT inhaler 2 puff ( Inhalation Given 03/28/24 1213)  ipratropium-albuterol (DUONEB) 0.5-2.5 (3) MG/3ML nebulizer solution 3 mL (3 mLs Nebulization Given 03/28/24 1439)  methylPREDNISolone  sodium succinate (SOLU-MEDROL ) 125 mg/2 mL injection 125 mg (125 mg Intravenous Given 03/28/24 1440)  iohexol (OMNIPAQUE) 350 MG/ML injection 75 mL (75 mLs Intravenous Contrast Given 03/28/24 1436)    Clinical Course as of 03/28/24 1515  Sun Mar 28, 2024  1401 Patient evaluated for productive cough and shortness of breath over the past couple weeks that developed after a sinus infection.  Upon arrival she is hemodynamically stable.  On exam she does have some faint wheezing diffusely without any obvious Rales.  Will administer nebs and steroids.  Her chest x-ray is without any obvious cardiopulmonary disease.  She does have a mild leukocytosis of 12.8.  Remainder of her lab work is unremarkable. [JT]  1438 CT Angio Chest PE W and/or Wo Contrast Negative for PE. [JT]  1515 Successful ambulation test.  Workup is overall reassuring.  Likely etiology bronchitis.  Do not suspect pneumonia.  Patient will be discharged home.  Will send in prescription for prednisone .  Provided PCP follow-up.  Patient is understanding and agreement with plan. [JT]    Clinical Course User Index [JT] Donnajean Lynwood DEL, PA-C                                 Medical Decision Making Amount and/or Complexity of Data Reviewed Labs: ordered. Radiology: ordered. Decision-making details documented in ED Course.  Risk Prescription drug management.   This patient presents to the ED with chief complaint(s) of shortness of breath.  The complaint involves an extensive  differential diagnosis and also carries with it a high risk of complications and morbidity.   Pertinent past medical history as listed in HPI  The differential diagnosis includes  ACS, PE, pneumonia, bronchitis Additional history obtained: Records reviewed Care Everywhere/External Records  Disposition:   Patient will be discharged home. The patient has been appropriately medically screened and/or stabilized in the ED. I have low suspicion for any other emergent medical condition which would require further screening, evaluation or treatment in the ED or require  inpatient management. At time of discharge the patient is hemodynamically stable and in no acute distress. I have discussed work-up results and diagnosis with patient and answered all questions. Patient is agreeable with discharge plan. We discussed strict return precautions for returning to the emergency department and they verbalized understanding.     Social Determinants of Health:   Patient's unhoused  increases the complexity of managing their presentation  This note was dictated with voice recognition software.  Despite best efforts at proofreading, errors may have occurred which can change the documentation meaning.       Final diagnoses:  Atypical chest pain    ED Discharge Orders          Ordered    predniSONE  (STERAPRED UNI-PAK 21 TAB) 10 MG (21) TBPK tablet  Daily        03/28/24 1514               Donnajean Lynwood VEAR DEVONNA 03/28/24 1515    Dean Clarity, MD 03/28/24 1635

## 2024-03-28 NOTE — ED Provider Triage Note (Signed)
 Emergency Medicine Provider Triage Evaluation Note  Alexis Ellis , a 33 y.o. female  was evaluated in triage.  Pt complains of SOB and productive cough for the past month.  She reports initially started for sinus affection and went to her primary care doctor who gave her an antibiotic.  She reports that she completed the entire course however was not feeling any better.  She reports that she may have had a fever a few weeks ago however has not had one since.  She progressively been more short of breath.  She reports that her cough has been mainly dry and will occasionally have some productive cough but it is very thick green and brown mucus.  She is having some chest pain and also headache as well.  Review of Systems  Positive:  Negative:   Physical Exam  BP (!) 149/102 (BP Location: Left Arm)   Pulse 97   Temp 97.9 F (36.6 C)   Resp (!) 22   Ht 5' 5 (1.651 m)   Wt 100.7 kg   LMP 03/10/2024 (Approximate)   SpO2 93%   BMI 36.94 kg/m  Gen:   Awake, uncomfortable Resp:  No tachypnea unless trying to speak MSK:   Moves extremities without difficulty  Other:  Diminished lung sounds in the bases  Medical Decision Making  Medically screening exam initiated at 12:09 PM.  Appropriate orders placed.  Alexis Ellis was informed that the remainder of the evaluation will be completed by another provider, this initial triage assessment does not replace that evaluation, and the importance of remaining in the ED until their evaluation is complete.  Sounds diminished in the bilateral lower bases.  Think this is likely more pneumonia versus bronchitis however will trial albuterol inhaler to see if patient improves.  Her oxygen is currently at 93%. Will place her on some oxygen for comfort   Bernis Ernst, PA-C 03/28/24 1212

## 2024-04-08 ENCOUNTER — Encounter: Payer: Self-pay | Admitting: Internal Medicine

## 2024-04-22 DIAGNOSIS — F4310 Post-traumatic stress disorder, unspecified: Secondary | ICD-10-CM | POA: Diagnosis not present

## 2024-05-04 ENCOUNTER — Encounter: Payer: Self-pay | Admitting: Physician Assistant

## 2024-05-04 ENCOUNTER — Telehealth: Admitting: Physician Assistant

## 2024-05-04 DIAGNOSIS — B3731 Acute candidiasis of vulva and vagina: Secondary | ICD-10-CM

## 2024-05-04 MED ORDER — FLUCONAZOLE 150 MG PO TABS: ORAL_TABLET | ORAL | 0 refills | Status: DC

## 2024-05-04 NOTE — Progress Notes (Signed)
 " Virtual Visit Consent   Alexis Ellis, you are scheduled for a virtual visit with a [facility] provider today. Just as with appointments in the office, your consent must be obtained to participate. Your consent will be active for this visit and any virtual visit you may have with one of our providers in the next 365 days. If you have a MyChart account, a copy of this consent can be sent to you electronically.  As this is a virtual visit, video technology does not allow for your provider to perform a traditional examination. This may limit your provider's ability to fully assess your condition. If your provider identifies any concerns that need to be evaluated in person or the need to arrange testing (such as labs, EKG, etc.), we will make arrangements to do so. Although advances in technology are sophisticated, we cannot ensure that it will always work on either your end or our end. If the connection with a video visit is poor, the visit may have to be switched to a telephone visit. With either a video or telephone visit, we are not always able to ensure that we have a secure connection.  By engaging in this virtual visit, you consent to the provision of healthcare and authorize for your insurance to be billed (if applicable) for the services provided during this visit. Depending on your insurance coverage, you may receive a charge related to this service.  I need to obtain your verbal consent now. Are you willing to proceed with your visit today? Alexis Ellis has provided verbal consent on 05/04/2024 for a virtual visit (video or telephone). Mykhia Danish, PA-C  Date: 05/04/2024 3:36 PM   Virtual Visit via Video Note   I, Alexis Ellis, connected with  GUNEET DELPINO  ([id number], 12-23-1990) on 05/04/2024 at  3:00 PM EST by a video-enabled telemedicine application and verified that I am speaking with the correct person using two identifiers.  Location: Patient: Virtual Visit Location  Patient: Other: car Provider: Virtual Visit Location Provider: Home Office   I discussed the limitations of evaluation and management by telemedicine and the availability of in person appointments. The patient expressed understanding and agreed to proceed.    History of Present Illness: Alexis Ellis is a 33 y.o. who identifies as a female who was assigned female at birth, and is being seen today for yeast infection.  HPI: Presents for evaluation of possible yeast infection for the last 3 to 4 days.  Patient reports vaginal itching, thick white discharge.  No recent antibiotic use.  She has had yeast infections in the past this feels similar.  She denies any concern for STDs, UTI symptoms at this time.  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  Resting comfortably Head is normocephalic, atraumatic.  No labored  breathing.  Speech is clear and coherent with logical content.  Patient is alert and oriented at baseline.    Assessment and Plan: 1. Vaginal yeast infection (Primary) - fluconazole  (DIFLUCAN ) 150 MG tablet; Take 1 tab on day one, repeat on day three if no relief  Dispense: 2 tablet; Refill: 0   Please go to the ER immediately if worsening symptoms, onset of fever, chills,  uncontrolled vomiting, back pain, worsening abdominal pain  Follow-up with your PCP in 5-7 days, if needed     Follow Up Instructions: I discussed the assessment and treatment plan with the patient. The patient was provided an opportunity to ask questions and all were answered. The patient agreed with the plan and demonstrated an understanding of the instructions.  A copy of instructions were sent to the patient via MyChart unless otherwise noted below.    The patient was advised to call back or seek an in-person evaluation if the symptoms worsen or if the condition fails to improve as anticipated.    Cathern Tahir, PA-C     [1] No Known Allergies [2]  Current Outpatient Medications:    amphetamine -dextroamphetamine  (ADDERALL) 20 MG tablet, Take 20 mg by mouth 3 (three) times daily., Disp: , Rfl:    clonazePAM (KLONOPIN) 1 MG tablet, Take 1 mg by mouth daily as needed for anxiety., Disp: , Rfl:    fluconazole  (DIFLUCAN ) 150 MG tablet, Take 1 tab on day one, repeat on day three if no relief, Disp: 2 tablet, Rfl: 0   fluticasone  (FLONASE ) 50 MCG/ACT nasal spray, Place 2 sprays into both nostrils daily., Disp: 16 g, Rfl: 6   ibuprofen  (ADVIL ) 800 MG tablet, Take 1 tablet (800 mg total) by mouth every 8 (eight) hours as needed (pain). Take with food to avoid stomach upset. Do not take any additional NSAIDs while on this. You may take tylenol  in addition to this if needed for extra pain relief., Disp: 21 tablet, Rfl: 0   lidocaine  (XYLOCAINE ) 2 % solution, Gargle with 15 mL every hour as needed for sore throat. Spit out after gargling - do not swallow. Avoid eating or drinking for at least 15 minutes after use., Disp: 200 mL, Rfl: 0   predniSONE  (STERAPRED UNI-PAK 21 TAB) 10 MG (21) TBPK tablet, Take by mouth daily. Take 6 tabs by mouth daily  for 2 days, then 5 tabs for 2 days, then 4 tabs for 2 days, then 3 tabs for 2 days, 2 tabs for 2 days, then 1 tab by mouth daily for 2 days, Disp: 42  tablet, Rfl: 0   promethazine -dextromethorphan (PROMETHAZINE -DM) 6.25-15 MG/5ML syrup, Take 5 mLs by mouth 4 (four) times daily as needed for cough., Disp: 240 mL, Rfl: 0   simvastatin  (ZOCOR ) 20 MG tablet, Take 1 tablet (20 mg total) by mouth at bedtime., Disp: 90 tablet, Rfl: 1  "

## 2024-05-04 NOTE — Patient Instructions (Signed)
 " Alexis Ellis, thank you for joining Vitoria Conyer, PA-C for today's virtual visit.  While this provider is not your primary care provider (PCP), if your PCP is located in our provider database this encounter information will be shared with them immediately following your visit.   A [facility] MyChart account gives you access to today's visit and all your visits, tests, and labs performed at [facility]  click here if you don't have a [facility] MyChart account or go to [email or web address]  Consent: (Patient) Alexis Ellis provided verbal consent for this virtual visit at the beginning of the encounter.  Current Medications:  Current Outpatient Medications:    amphetamine -dextroamphetamine  (ADDERALL) 20 MG tablet, Take 20 mg by mouth 3 (three) times daily., Disp: , Rfl:    clonazePAM (KLONOPIN) 1 MG tablet, Take 1 mg by mouth daily as needed for anxiety., Disp: , Rfl:    fluconazole  (DIFLUCAN ) 150 MG tablet, Take 1 tab on day one, repeat on day three if no relief, Disp: 2 tablet, Rfl: 0   fluticasone  (FLONASE ) 50 MCG/ACT nasal spray, Place 2 sprays into both nostrils daily., Disp: 16 g, Rfl: 6   ibuprofen  (ADVIL ) 800 MG tablet, Take 1 tablet (800 mg total) by mouth every 8 (eight) hours as needed (pain). Take with food to avoid stomach upset. Do not take any additional NSAIDs while on this. You may take tylenol  in addition to this if needed for extra pain relief., Disp: 21 tablet, Rfl: 0   lidocaine  (XYLOCAINE ) 2 % solution, Gargle with 15 mL every hour as needed for sore throat. Spit out after gargling - do not swallow. Avoid eating or drinking for at least 15 minutes after use., Disp: 200 mL, Rfl: 0   predniSONE  (STERAPRED UNI-PAK 21 TAB) 10 MG (21) TBPK tablet, Take by mouth daily. Take 6 tabs by mouth daily  for 2 days, then 5 tabs for 2 days, then 4 tabs for 2 days, then 3 tabs for 2 days, 2 tabs for 2 days, then 1 tab by mouth daily for 2 days, Disp: 42 tablet, Rfl:  0   promethazine -dextromethorphan (PROMETHAZINE -DM) 6.25-15 MG/5ML syrup, Take 5 mLs by mouth 4 (four) times daily as needed for cough., Disp: 240 mL, Rfl: 0   simvastatin  (ZOCOR ) 20 MG tablet, Take 1 tablet (20 mg total) by mouth at bedtime., Disp: 90 tablet, Rfl: 1   Medications ordered in this encounter:  Meds ordered this encounter  Medications   DISCONTD: fluconazole  (DIFLUCAN ) 150 MG tablet    Sig: Take 1 tab on day one, repeat on day three if no relief    Dispense:  2 tablet    Refill:  0    Supervising Provider:   LAMPTEY, PHILIP O [[id number]]   fluconazole  (DIFLUCAN ) 150 MG tablet    Sig: Take 1 tab on day one, repeat on day three if no relief    Dispense:  2 tablet    Refill:  0    Supervising Provider:   BLAISE ALEENE KIDD [[id number]]     *If you need refills on other medications prior to your next appointment, please contact your pharmacy*  Follow-Up: Call back or seek an in-person evaluation if the symptoms worsen or if the condition fails to improve as anticipated.  [facility] [phone]  Other Instructions    If you have been instructed to have an in-person evaluation today at a local Urgent Care facility, please use the link below. It will  take you to a list of all of our available [facility], including address, phone number and hours of operation. Please do not delay care.  [facility]  If you or a family member do not have a primary care provider, use the link below to schedule a visit and establish care. When you choose a [facility] primary care physician or advanced practice provider, you gain a long-term partner in health. Find a Primary Care Provider  Learn more about [facility]'s in-office and virtual care options: [facility] Get Care Now  "

## 2024-05-10 NOTE — Progress Notes (Unsigned)
 "  Subjective:    Patient ID: Alexis Ellis, female    DOB: 1990/11/14, 33 y.o.   MRN: [id number]  HPI  Patient presents to clinic today for her annual exam.    Flu: 12/2018 Tetanus: 10/2021 COVID: never Pap smear: 04/2021 Dentist: as needed  Diet: She does eat meat. She consumes fruits and veggies. She tries to avoid fried foods. She drinks mostly sweet tea and water. Exercise: Walking with kids.  Review of Systems     Past Medical History:  Diagnosis Date   ADD (attention deficit disorder)    Anxiety    Chicken pox    Depression    Diet controlled gestational diabetes mellitus (GDM) in third trimester 03/23/2019   Ectopic pregnancy    Elevated blood pressure    Elevated cholesterol    Frequent headaches    Gestational diabetes    Gestational hypertension 04/19/2019   History of gestational diabetes in prior pregnancy, currently pregnant 07/06/2021   History of kidney stones    HLD (hyperlipidemia) 09/29/2015   Pregnancy induced hypertension    Status post emergency cesarean section 01/03/2022    Current Outpatient Medications  Medication Sig Dispense Refill   amphetamine -dextroamphetamine  (ADDERALL) 20 MG tablet Take 20 mg by mouth 3 (three) times daily.     clonazePAM (KLONOPIN) 1 MG tablet Take 1 mg by mouth daily as needed for anxiety.     fluconazole  (DIFLUCAN ) 150 MG tablet Take 1 tab on day one, repeat on day three if no relief 2 tablet 0   fluticasone  (FLONASE ) 50 MCG/ACT nasal spray Place 2 sprays into both nostrils daily. 16 g 6   ibuprofen  (ADVIL ) 800 MG tablet Take 1 tablet (800 mg total) by mouth every 8 (eight) hours as needed (pain). Take with food to avoid stomach upset. Do not take any additional NSAIDs while on this. You may take tylenol  in addition to this if needed for extra pain relief. 21 tablet 0   lidocaine  (XYLOCAINE ) 2 % solution Gargle with 15 mL every hour as needed for sore throat. Spit out after gargling - do not swallow. Avoid eating or  drinking for at least 15 minutes after use. 200 mL 0   predniSONE  (STERAPRED UNI-PAK 21 TAB) 10 MG (21) TBPK tablet Take by mouth daily. Take 6 tabs by mouth daily  for 2 days, then 5 tabs for 2 days, then 4 tabs for 2 days, then 3 tabs for 2 days, 2 tabs for 2 days, then 1 tab by mouth daily for 2 days 42 tablet 0   promethazine -dextromethorphan (PROMETHAZINE -DM) 6.25-15 MG/5ML syrup Take 5 mLs by mouth 4 (four) times daily as needed for cough. 240 mL 0   simvastatin  (ZOCOR ) 20 MG tablet Take 1 tablet (20 mg total) by mouth at bedtime. 90 tablet 1   No current facility-administered medications for this visit.    No Known Allergies  Family History  Problem Relation Age of Onset   Hypertension Mother        Living   Fibromyalgia Mother    Diabetes Father 50       Deceased   Heart attack Father    Sudden death Father    Bipolar disorder Maternal Grandmother    Diabetes Maternal Grandfather    Alcohol abuse Paternal Grandfather    Liver disease Paternal Grandfather    Hyperlipidemia Paternal Grandmother    Kidney disease Paternal Grandmother    Liver disease Paternal Grandmother    Heart disease Paternal Grandmother  Healthy Sister    Healthy Son     Social History   Socioeconomic History   Marital status: Single    Spouse name: Not on file   Number of children: Not on file   Years of education: Not on file   Highest education level: Some college, no degree  Occupational History   Not on file  Tobacco Use   Smoking status: Former    Current packs/day: 0.00    Types: Cigarettes    Quit date: 2020    Years since quitting: 5.9    Passive exposure: Current   Smokeless tobacco: Never  Vaping Use   Vaping status: Never Used  Substance and Sexual Activity   Alcohol use: Not Currently    Comment: ocassional   Drug use: No   Sexual activity: Not Currently    Partners: Male  Other Topics Concern   Not on file  Social History Narrative   Not on file   Social Drivers  of Health   Tobacco Use: Medium Risk (05/04/2024)   Patient History    Smoking Tobacco Use: Former    Smokeless Tobacco Use: Never    Passive Exposure: Current  Physicist, Medical Strain: High Risk (05/05/2023)   Overall Financial Resource Strain (CARDIA)    Difficulty of Paying Living Expenses: Very hard  Food Insecurity: Unknown (05/05/2023)   Hunger Vital Sign    Worried About Running Out of Food in the Last Year: Never true    Ran Out of Food in the Last Year: Patient declined  Transportation Needs: No Transportation Needs (05/05/2023)   PRAPARE - Administrator, Civil Service (Medical): No    Lack of Transportation (Non-Medical): No  Physical Activity: Sufficiently Active (05/05/2023)   Exercise Vital Sign    Days of Exercise per Week: 5 days    Minutes of Exercise per Session: 30 min  Stress: Stress Concern Present (05/05/2023)   [employer] of Occupational Health - Occupational Stress Questionnaire    Feeling of Stress : Rather much  Social Connections: Moderately Isolated (05/05/2023)   Social Connection and Isolation Panel    Frequency of Communication with Friends and Family: Twice a week    Frequency of Social Gatherings with Friends and Family: Once a week    Attends Religious Services: 1 to 4 times per year    Active Member of [employer] or Organizations: No    Attends Engineer, Structural: Not on file    Marital Status: Never married  Intimate Partner Violence: Not on file  Depression (PHQ2-9): Low Risk (05/05/2023)   Depression (PHQ2-9)    PHQ-2 Score: 3  Alcohol Screen: Low Risk (10/23/2022)   Alcohol Screen    Last Alcohol Screening Score (AUDIT): 0  Housing: High Risk (05/05/2023)   Housing Stability Vital Sign    Unable to Pay for Housing in the Last Year: Patient declined    Number of Times Moved in the Last Year: 6    Homeless in the Last Year: Yes  Utilities: Not on file  Health Literacy: Not on file     Constitutional:  Denies fever, malaise, fatigue, headache or abrupt weight changes.  HEENT: Denies eye pain, eye redness, ear pain, ringing in the ears, wax buildup, runny nose, nasal congestion, bloody nose, or sore throat. Respiratory: Denies difficulty breathing, shortness of breath, cough or sputum production.   Cardiovascular: Patient reports intermittent swelling in legs.  Denies chest pain, chest tightness, palpitations or swelling in the hands.  Gastrointestinal: Pt reports intermittent constipation. Denies abdominal pain, bloating, diarrhea or blood in the stool.  GU: Denies urgency, frequency, pain with urination, burning sensation, blood in urine, odor or discharge. Musculoskeletal: Patient reports chronic joint pain.  Denies decrease in range of motion, difficulty with gait, or joint swelling.  Skin: Denies redness, rashes, lesions or ulcercations.  Neurological: Patient reports inattention. Denies dizziness, difficulty with memory, difficulty with speech or problems with balance and coordination.  Psych: Patient has a history of anxiety.  Denies depression, SI/HI.  No other specific complaints in a complete review of systems (except as listed in HPI above).  Objective:   Physical Exam BP 122/84 (BP Location: Right Arm, Patient Position: Sitting, Cuff Size: Large)   Ht 5' 5 (1.651 m)   Wt 224 lb 12.8 oz (102 kg)   LMP 04/30/2024 (Exact Date)   BMI 37.41 kg/m     Wt Readings from Last 3 Encounters:  03/28/24 222 lb (100.7 kg)  02/29/24 230 lb 9.6 oz (104.6 kg)  02/27/24 230 lb 9.6 oz (104.6 kg)    General: Appears her stated age, obese in NAD. Skin: Warm, dry and intact.  HEENT: Head: normal shape and size; Eyes: sclera white, no icterus, conjunctiva pink, PERRLA and EOMs intact;  Neck:  Neck supple, trachea midline. No masses, lumps or thyromegaly present.  Cardiovascular: Normal rate and rhythm. S1,S2 noted.  No murmur, rubs or gallops noted. No JVD or BLE edema.  Pulmonary/Chest:  Normal effort and positive vesicular breath sounds. No respiratory distress. No wheezes, rales or ronchi noted.  Abdomen: Normal bowel sounds.  Pelvic: Normal female anatomy.  Cervix with changes noted around the os.  No CMT.  Adnexa nonpalpable. Musculoskeletal: Strength 5/5 BUE/BLE.  No difficulty with gait.  Neurological: Alert and oriented. Cranial nerves II-XII grossly intact. Coordination normal.  Psychiatric: Mood and affect normal.  Anxious appearing. Judgment and thought content normal.   BMET    Component Value Date/Time   NA 140 03/28/2024 1121   NA 139 02/26/2022 1133   K 3.7 03/28/2024 1121   CL 107 03/28/2024 1121   CO2 23 03/28/2024 1121   GLUCOSE 95 03/28/2024 1121   BUN 8 03/28/2024 1121   BUN 12 02/26/2022 1133   CREATININE 0.82 03/28/2024 1121   CREATININE 0.71 11/04/2023 1105   CALCIUM  8.9 03/28/2024 1121   GFRNONAA >60 03/28/2024 1121   GFRAA >60 05/01/2019 1723    Lipid Panel     Component Value Date/Time   CHOL 242 (H) 11/04/2023 1105   TRIG 167 (H) 11/04/2023 1105   HDL 44 (L) 11/04/2023 1105   CHOLHDL 5.5 (H) 11/04/2023 1105   VLDL 26.8 03/27/2020 1227   LDLCALC 166 (H) 11/04/2023 1105    CBC    Component Value Date/Time   WBC 12.8 (H) 03/28/2024 1121   RBC 4.95 03/28/2024 1121   HGB 14.6 03/28/2024 1121   HGB 12.1 02/26/2022 1133   HCT 43.1 03/28/2024 1121   HCT 38.1 02/26/2022 1133   PLT 329 03/28/2024 1121   PLT 307 02/26/2022 1133   MCV 87.1 03/28/2024 1121   MCV 77 (L) 02/26/2022 1133   MCH 29.5 03/28/2024 1121   MCHC 33.9 03/28/2024 1121   RDW 12.8 03/28/2024 1121   RDW 13.8 02/26/2022 1133   LYMPHSABS 2.4 07/06/2021 1058   MONOABS 0.5 12/26/2020 1426   EOSABS 0.1 07/06/2021 1058   BASOSABS 0.1 07/06/2021 1058    Hgb A1C Lab Results  Component Value Date  HGBA1C 5.3 11/04/2023           Assessment & Plan:   Preventative Health Maintenance:  She declines flu shot Tetanus UTD Encouraged her to get her  COVID-vaccine Pap smear today with STD screening Encouraged her to consume a balanced diet and exercise regimen Advised her to see a dentist annually We will check CBC, c-Met, lipid, A1c today  RTC in 6 months, follow-up chronic conditions Angeline Laura, NP    "

## 2024-05-11 ENCOUNTER — Ambulatory Visit (INDEPENDENT_AMBULATORY_CARE_PROVIDER_SITE_OTHER): Admitting: Internal Medicine

## 2024-05-11 ENCOUNTER — Encounter: Payer: Self-pay | Admitting: Internal Medicine

## 2024-05-11 ENCOUNTER — Other Ambulatory Visit (HOSPITAL_COMMUNITY)
Admission: RE | Admit: 2024-05-11 | Discharge: 2024-05-11 | Disposition: A | Discharge status: Home / Self Care | Source: Ambulatory Visit | Attending: Internal Medicine | Admitting: Internal Medicine

## 2024-05-11 VITALS — BP 122/84 | Ht 65.0 in | Wt 224.8 lb

## 2024-05-11 DIAGNOSIS — R739 Hyperglycemia, unspecified: Secondary | ICD-10-CM | POA: Diagnosis not present

## 2024-05-11 DIAGNOSIS — Z124 Encounter for screening for malignant neoplasm of cervix: Secondary | ICD-10-CM

## 2024-05-11 DIAGNOSIS — E782 Mixed hyperlipidemia: Secondary | ICD-10-CM | POA: Diagnosis not present

## 2024-05-11 DIAGNOSIS — Z0001 Encounter for general adult medical examination with abnormal findings: Secondary | ICD-10-CM | POA: Diagnosis not present

## 2024-05-11 DIAGNOSIS — Z6837 Body mass index (BMI) 37.0-37.9, adult: Secondary | ICD-10-CM

## 2024-05-11 DIAGNOSIS — E66812 Obesity, class 2: Secondary | ICD-10-CM

## 2024-05-11 DIAGNOSIS — E041 Nontoxic single thyroid nodule: Secondary | ICD-10-CM | POA: Diagnosis not present

## 2024-05-11 NOTE — Patient Instructions (Signed)

## 2024-05-11 NOTE — Assessment & Plan Note (Signed)
 Encourage diet and exercise for weight loss

## 2024-05-12 ENCOUNTER — Ambulatory Visit: Payer: Self-pay | Admitting: Internal Medicine

## 2024-05-12 DIAGNOSIS — E782 Mixed hyperlipidemia: Secondary | ICD-10-CM

## 2024-05-14 ENCOUNTER — Other Ambulatory Visit: Payer: Self-pay | Admitting: Internal Medicine

## 2024-05-14 ENCOUNTER — Other Ambulatory Visit (HOSPITAL_COMMUNITY): Payer: Self-pay

## 2024-05-14 LAB — COMPREHENSIVE METABOLIC PANEL WITH GFR
AG Ratio: 2.3 (calc) (ref 1.0–2.5)
ALT: 21 U/L (ref 6–29)
AST: 17 U/L (ref 10–30)
Albumin: 4.3 g/dL (ref 3.6–5.1)
Alkaline phosphatase (APISO): 77 U/L (ref 31–125)
BUN: 8 mg/dL (ref 7–25)
CO2: 28 mmol/L (ref 20–32)
Calcium: 9.2 mg/dL (ref 8.6–10.2)
Chloride: 103 mmol/L (ref 98–110)
Creat: 0.75 mg/dL (ref 0.50–0.97)
Globulin: 1.9 g/dL (ref 1.9–3.7)
Glucose, Bld: 83 mg/dL (ref 65–99)
Potassium: 4.4 mmol/L (ref 3.5–5.3)
Sodium: 137 mmol/L (ref 135–146)
Total Bilirubin: 0.7 mg/dL (ref 0.2–1.2)
Total Protein: 6.2 g/dL (ref 6.1–8.1)
eGFR: 106 mL/min/1.73m2

## 2024-05-14 LAB — LIPID PANEL
Cholesterol: 197 mg/dL
HDL: 41 mg/dL — ABNORMAL LOW
LDL Cholesterol (Calc): 133 mg/dL — ABNORMAL HIGH
Non-HDL Cholesterol (Calc): 156 mg/dL — ABNORMAL HIGH
Total CHOL/HDL Ratio: 4.8 (calc)
Triglycerides: 123 mg/dL

## 2024-05-14 LAB — CBC
HCT: 46.7 % — ABNORMAL HIGH (ref 35.9–46.0)
Hemoglobin: 15.5 g/dL (ref 11.7–15.5)
MCH: 28.7 pg (ref 27.0–33.0)
MCHC: 33.2 g/dL (ref 31.6–35.4)
MCV: 86.3 fL (ref 81.4–101.7)
MPV: 9.6 fL (ref 7.5–12.5)
Platelets: 344 Thousand/uL (ref 140–400)
RBC: 5.41 Million/uL — ABNORMAL HIGH (ref 3.80–5.10)
RDW: 12.3 % (ref 11.0–15.0)
WBC: 8 Thousand/uL (ref 3.8–10.8)

## 2024-05-14 LAB — TSH+FREE T4: TSH W/REFLEX TO FT4: 0.75 m[IU]/L

## 2024-05-14 LAB — HEMOGLOBIN A1C
Hgb A1c MFr Bld: 5.2 %
Mean Plasma Glucose: 103 mg/dL
eAG (mmol/L): 5.7 mmol/L

## 2024-05-14 LAB — THYROID PEROXIDASE ANTIBODIES (TPO) (REFL): Thyroperoxidase Ab SerPl-aCnc: 1 [IU]/mL

## 2024-05-14 LAB — T3: T3, Total: 127 ng/dL (ref 76–181)

## 2024-05-14 MED ORDER — SIMVASTATIN 20 MG PO TABS: 20.0000 mg | ORAL_TABLET | Freq: Every day | ORAL | 1 refills | Status: AC

## 2024-05-14 MED ORDER — SIMVASTATIN 20 MG PO TABS
20.0000 mg | ORAL_TABLET | Freq: Every day | ORAL | 1 refills | Status: DC
  Filled 2024-05-14: qty 90, 90d supply, fill #0

## 2024-05-17 LAB — CYTOLOGY - PAP
Chlamydia: NEGATIVE
Comment: NEGATIVE
Comment: NEGATIVE
Comment: NEGATIVE
Comment: NORMAL
Diagnosis: NEGATIVE
High risk HPV: NEGATIVE
Neisseria Gonorrhea: NEGATIVE
Trichomonas: NEGATIVE

## 2024-06-22 ENCOUNTER — Ambulatory Visit: Admitting: Internal Medicine

## 2024-11-08 ENCOUNTER — Ambulatory Visit: Admitting: Internal Medicine
